# Patient Record
Sex: Male | Born: 1960 | Race: White | Hispanic: No | Marital: Married | Smoking: Former smoker
Health system: Southern US, Community
[De-identification: ages and names within clinical notes are randomized; demographics above are authoritative.]

## PROBLEM LIST (undated history)

## (undated) DIAGNOSIS — M359 Systemic involvement of connective tissue, unspecified: Secondary | ICD-10-CM

## (undated) DIAGNOSIS — N289 Disorder of kidney and ureter, unspecified: Secondary | ICD-10-CM

## (undated) DIAGNOSIS — I739 Peripheral vascular disease, unspecified: Secondary | ICD-10-CM

## (undated) DIAGNOSIS — M5030 Other cervical disc degeneration, unspecified cervical region: Secondary | ICD-10-CM

## (undated) DIAGNOSIS — M059 Rheumatoid arthritis with rheumatoid factor, unspecified: Secondary | ICD-10-CM

## (undated) DIAGNOSIS — E785 Hyperlipidemia, unspecified: Secondary | ICD-10-CM

## (undated) DIAGNOSIS — Z8719 Personal history of other diseases of the digestive system: Secondary | ICD-10-CM

## (undated) DIAGNOSIS — G4730 Sleep apnea, unspecified: Secondary | ICD-10-CM

## (undated) DIAGNOSIS — I10 Essential (primary) hypertension: Secondary | ICD-10-CM

## (undated) DIAGNOSIS — Z87442 Personal history of urinary calculi: Secondary | ICD-10-CM

## (undated) DIAGNOSIS — K5909 Other constipation: Secondary | ICD-10-CM

## (undated) DIAGNOSIS — M549 Dorsalgia, unspecified: Secondary | ICD-10-CM

## (undated) DIAGNOSIS — R634 Abnormal weight loss: Secondary | ICD-10-CM

## (undated) DIAGNOSIS — K3184 Gastroparesis: Secondary | ICD-10-CM

## (undated) HISTORY — DX: Hyperlipidemia, unspecified: E78.5

## (undated) HISTORY — DX: Peripheral vascular disease, unspecified: I73.9

## (undated) HISTORY — PX: KNEE RECONSTRUCTION: SHX5883

## (undated) HISTORY — PX: BACK SURGERY: SHX140

## (undated) HISTORY — DX: Other cervical disc degeneration, unspecified cervical region: M50.30

---

## 2004-12-13 ENCOUNTER — Emergency Department: Payer: Self-pay | Admitting: Emergency Medicine

## 2004-12-27 ENCOUNTER — Ambulatory Visit: Payer: Self-pay

## 2005-09-20 ENCOUNTER — Other Ambulatory Visit: Payer: Self-pay

## 2005-09-20 ENCOUNTER — Ambulatory Visit: Payer: Self-pay | Admitting: Surgery

## 2005-09-26 ENCOUNTER — Ambulatory Visit: Payer: Self-pay | Admitting: Surgery

## 2005-11-20 ENCOUNTER — Ambulatory Visit: Payer: Self-pay | Admitting: Surgery

## 2006-03-11 ENCOUNTER — Other Ambulatory Visit: Payer: Self-pay

## 2006-03-11 ENCOUNTER — Emergency Department: Payer: Self-pay | Admitting: Emergency Medicine

## 2009-03-16 ENCOUNTER — Emergency Department: Payer: Self-pay | Admitting: Internal Medicine

## 2009-10-15 ENCOUNTER — Emergency Department: Payer: Self-pay | Admitting: Emergency Medicine

## 2011-05-21 ENCOUNTER — Emergency Department: Payer: Self-pay | Admitting: Emergency Medicine

## 2011-10-17 DIAGNOSIS — L0291 Cutaneous abscess, unspecified: Secondary | ICD-10-CM | POA: Diagnosis not present

## 2011-10-17 DIAGNOSIS — L0390 Cellulitis, unspecified: Secondary | ICD-10-CM | POA: Diagnosis not present

## 2011-10-17 DIAGNOSIS — A4902 Methicillin resistant Staphylococcus aureus infection, unspecified site: Secondary | ICD-10-CM | POA: Diagnosis not present

## 2011-10-17 DIAGNOSIS — M069 Rheumatoid arthritis, unspecified: Secondary | ICD-10-CM | POA: Diagnosis not present

## 2011-10-17 DIAGNOSIS — L723 Sebaceous cyst: Secondary | ICD-10-CM | POA: Diagnosis not present

## 2011-10-28 DIAGNOSIS — IMO0001 Reserved for inherently not codable concepts without codable children: Secondary | ICD-10-CM | POA: Diagnosis not present

## 2011-10-28 DIAGNOSIS — F17200 Nicotine dependence, unspecified, uncomplicated: Secondary | ICD-10-CM | POA: Diagnosis not present

## 2011-10-28 DIAGNOSIS — M069 Rheumatoid arthritis, unspecified: Secondary | ICD-10-CM | POA: Diagnosis not present

## 2011-10-28 DIAGNOSIS — M549 Dorsalgia, unspecified: Secondary | ICD-10-CM | POA: Diagnosis not present

## 2011-10-28 DIAGNOSIS — R079 Chest pain, unspecified: Secondary | ICD-10-CM | POA: Diagnosis not present

## 2011-10-28 DIAGNOSIS — R05 Cough: Secondary | ICD-10-CM | POA: Diagnosis not present

## 2011-11-05 DIAGNOSIS — M5137 Other intervertebral disc degeneration, lumbosacral region: Secondary | ICD-10-CM | POA: Diagnosis not present

## 2011-11-05 DIAGNOSIS — G894 Chronic pain syndrome: Secondary | ICD-10-CM | POA: Diagnosis not present

## 2011-12-26 DIAGNOSIS — Z79899 Other long term (current) drug therapy: Secondary | ICD-10-CM | POA: Diagnosis not present

## 2011-12-26 DIAGNOSIS — M069 Rheumatoid arthritis, unspecified: Secondary | ICD-10-CM | POA: Diagnosis not present

## 2012-01-14 DIAGNOSIS — M069 Rheumatoid arthritis, unspecified: Secondary | ICD-10-CM | POA: Diagnosis not present

## 2012-02-07 DIAGNOSIS — H6120 Impacted cerumen, unspecified ear: Secondary | ICD-10-CM | POA: Diagnosis not present

## 2012-02-07 DIAGNOSIS — H6980 Other specified disorders of Eustachian tube, unspecified ear: Secondary | ICD-10-CM | POA: Diagnosis not present

## 2012-02-07 DIAGNOSIS — H908 Mixed conductive and sensorineural hearing loss, unspecified: Secondary | ICD-10-CM | POA: Diagnosis not present

## 2012-02-07 DIAGNOSIS — H9319 Tinnitus, unspecified ear: Secondary | ICD-10-CM | POA: Diagnosis not present

## 2012-02-27 DIAGNOSIS — Z79899 Other long term (current) drug therapy: Secondary | ICD-10-CM | POA: Diagnosis not present

## 2012-02-27 DIAGNOSIS — M5137 Other intervertebral disc degeneration, lumbosacral region: Secondary | ICD-10-CM | POA: Diagnosis not present

## 2012-02-27 DIAGNOSIS — M51379 Other intervertebral disc degeneration, lumbosacral region without mention of lumbar back pain or lower extremity pain: Secondary | ICD-10-CM | POA: Diagnosis not present

## 2012-02-27 DIAGNOSIS — G894 Chronic pain syndrome: Secondary | ICD-10-CM | POA: Diagnosis not present

## 2012-02-28 DIAGNOSIS — H908 Mixed conductive and sensorineural hearing loss, unspecified: Secondary | ICD-10-CM | POA: Diagnosis not present

## 2012-02-28 DIAGNOSIS — H6120 Impacted cerumen, unspecified ear: Secondary | ICD-10-CM | POA: Diagnosis not present

## 2012-02-28 DIAGNOSIS — H6980 Other specified disorders of Eustachian tube, unspecified ear: Secondary | ICD-10-CM | POA: Diagnosis not present

## 2012-04-03 ENCOUNTER — Emergency Department: Payer: Self-pay | Admitting: Emergency Medicine

## 2012-04-03 DIAGNOSIS — F17200 Nicotine dependence, unspecified, uncomplicated: Secondary | ICD-10-CM | POA: Diagnosis not present

## 2012-04-03 DIAGNOSIS — S59909A Unspecified injury of unspecified elbow, initial encounter: Secondary | ICD-10-CM | POA: Diagnosis not present

## 2012-04-03 DIAGNOSIS — S6990XA Unspecified injury of unspecified wrist, hand and finger(s), initial encounter: Secondary | ICD-10-CM | POA: Diagnosis not present

## 2012-04-03 DIAGNOSIS — M7989 Other specified soft tissue disorders: Secondary | ICD-10-CM | POA: Diagnosis not present

## 2012-04-03 DIAGNOSIS — S5010XA Contusion of unspecified forearm, initial encounter: Secondary | ICD-10-CM | POA: Diagnosis not present

## 2012-05-27 DIAGNOSIS — G894 Chronic pain syndrome: Secondary | ICD-10-CM | POA: Diagnosis not present

## 2012-05-27 DIAGNOSIS — M51379 Other intervertebral disc degeneration, lumbosacral region without mention of lumbar back pain or lower extremity pain: Secondary | ICD-10-CM | POA: Diagnosis not present

## 2012-05-27 DIAGNOSIS — M5137 Other intervertebral disc degeneration, lumbosacral region: Secondary | ICD-10-CM | POA: Diagnosis not present

## 2012-05-27 DIAGNOSIS — Z79899 Other long term (current) drug therapy: Secondary | ICD-10-CM | POA: Diagnosis not present

## 2012-07-15 DIAGNOSIS — M7020 Olecranon bursitis, unspecified elbow: Secondary | ICD-10-CM | POA: Diagnosis not present

## 2012-07-15 DIAGNOSIS — M069 Rheumatoid arthritis, unspecified: Secondary | ICD-10-CM | POA: Diagnosis not present

## 2012-08-12 DIAGNOSIS — Z23 Encounter for immunization: Secondary | ICD-10-CM | POA: Diagnosis not present

## 2012-08-25 DIAGNOSIS — Z79899 Other long term (current) drug therapy: Secondary | ICD-10-CM | POA: Diagnosis not present

## 2012-08-25 DIAGNOSIS — M5137 Other intervertebral disc degeneration, lumbosacral region: Secondary | ICD-10-CM | POA: Diagnosis not present

## 2012-08-25 DIAGNOSIS — G894 Chronic pain syndrome: Secondary | ICD-10-CM | POA: Diagnosis not present

## 2012-08-25 DIAGNOSIS — M51379 Other intervertebral disc degeneration, lumbosacral region without mention of lumbar back pain or lower extremity pain: Secondary | ICD-10-CM | POA: Diagnosis not present

## 2013-01-13 DIAGNOSIS — M069 Rheumatoid arthritis, unspecified: Secondary | ICD-10-CM | POA: Diagnosis not present

## 2013-01-14 DIAGNOSIS — Z79899 Other long term (current) drug therapy: Secondary | ICD-10-CM | POA: Diagnosis not present

## 2013-01-14 DIAGNOSIS — M069 Rheumatoid arthritis, unspecified: Secondary | ICD-10-CM | POA: Diagnosis not present

## 2013-02-24 DIAGNOSIS — G894 Chronic pain syndrome: Secondary | ICD-10-CM | POA: Diagnosis not present

## 2013-02-24 DIAGNOSIS — M5137 Other intervertebral disc degeneration, lumbosacral region: Secondary | ICD-10-CM | POA: Diagnosis not present

## 2013-02-24 DIAGNOSIS — Z79899 Other long term (current) drug therapy: Secondary | ICD-10-CM | POA: Diagnosis not present

## 2013-02-26 DIAGNOSIS — Z79899 Other long term (current) drug therapy: Secondary | ICD-10-CM | POA: Diagnosis not present

## 2013-05-25 DIAGNOSIS — M5137 Other intervertebral disc degeneration, lumbosacral region: Secondary | ICD-10-CM | POA: Diagnosis not present

## 2013-05-25 DIAGNOSIS — Z79899 Other long term (current) drug therapy: Secondary | ICD-10-CM | POA: Diagnosis not present

## 2013-05-25 DIAGNOSIS — G894 Chronic pain syndrome: Secondary | ICD-10-CM | POA: Diagnosis not present

## 2013-07-23 DIAGNOSIS — M159 Polyosteoarthritis, unspecified: Secondary | ICD-10-CM | POA: Diagnosis not present

## 2013-07-23 DIAGNOSIS — M069 Rheumatoid arthritis, unspecified: Secondary | ICD-10-CM | POA: Diagnosis not present

## 2013-07-23 DIAGNOSIS — M25549 Pain in joints of unspecified hand: Secondary | ICD-10-CM | POA: Diagnosis not present

## 2013-07-24 DIAGNOSIS — G894 Chronic pain syndrome: Secondary | ICD-10-CM | POA: Diagnosis not present

## 2013-07-24 DIAGNOSIS — Z79899 Other long term (current) drug therapy: Secondary | ICD-10-CM | POA: Diagnosis not present

## 2013-07-24 DIAGNOSIS — M5137 Other intervertebral disc degeneration, lumbosacral region: Secondary | ICD-10-CM | POA: Diagnosis not present

## 2013-08-04 DIAGNOSIS — Z23 Encounter for immunization: Secondary | ICD-10-CM | POA: Diagnosis not present

## 2013-09-03 DIAGNOSIS — M545 Low back pain, unspecified: Secondary | ICD-10-CM | POA: Diagnosis not present

## 2013-09-03 DIAGNOSIS — M069 Rheumatoid arthritis, unspecified: Secondary | ICD-10-CM | POA: Diagnosis not present

## 2013-09-03 DIAGNOSIS — M79609 Pain in unspecified limb: Secondary | ICD-10-CM | POA: Diagnosis not present

## 2013-09-22 DIAGNOSIS — G894 Chronic pain syndrome: Secondary | ICD-10-CM | POA: Diagnosis not present

## 2013-09-22 DIAGNOSIS — M5137 Other intervertebral disc degeneration, lumbosacral region: Secondary | ICD-10-CM | POA: Diagnosis not present

## 2013-09-22 DIAGNOSIS — Z79899 Other long term (current) drug therapy: Secondary | ICD-10-CM | POA: Diagnosis not present

## 2013-10-14 DIAGNOSIS — R079 Chest pain, unspecified: Secondary | ICD-10-CM | POA: Diagnosis not present

## 2013-10-14 DIAGNOSIS — R51 Headache: Secondary | ICD-10-CM | POA: Diagnosis not present

## 2013-10-14 DIAGNOSIS — I10 Essential (primary) hypertension: Secondary | ICD-10-CM | POA: Diagnosis not present

## 2013-10-15 ENCOUNTER — Other Ambulatory Visit: Payer: Self-pay | Admitting: Physician Assistant

## 2013-10-15 DIAGNOSIS — R51 Headache: Secondary | ICD-10-CM | POA: Diagnosis not present

## 2013-10-15 DIAGNOSIS — Z0000 Encounter for general adult medical examination without abnormal findings: Secondary | ICD-10-CM | POA: Diagnosis not present

## 2013-10-15 DIAGNOSIS — M069 Rheumatoid arthritis, unspecified: Secondary | ICD-10-CM | POA: Diagnosis not present

## 2013-10-15 DIAGNOSIS — Z13220 Encounter for screening for lipoid disorders: Secondary | ICD-10-CM | POA: Diagnosis not present

## 2013-10-15 DIAGNOSIS — M76899 Other specified enthesopathies of unspecified lower limb, excluding foot: Secondary | ICD-10-CM | POA: Diagnosis not present

## 2013-10-15 DIAGNOSIS — I10 Essential (primary) hypertension: Secondary | ICD-10-CM | POA: Diagnosis not present

## 2013-10-15 LAB — CBC WITH DIFFERENTIAL/PLATELET
BASOS ABS: 0.1 10*3/uL (ref 0.0–0.1)
BASOS PCT: 0.8 %
EOS PCT: 3.5 %
Eosinophil #: 0.3 10*3/uL (ref 0.0–0.7)
HCT: 44.4 % (ref 40.0–52.0)
HGB: 15.3 g/dL (ref 13.0–18.0)
LYMPHS PCT: 49.6 %
Lymphocyte #: 4.2 10*3/uL — ABNORMAL HIGH (ref 1.0–3.6)
MCH: 30.3 pg (ref 26.0–34.0)
MCHC: 34.4 g/dL (ref 32.0–36.0)
MCV: 88 fL (ref 80–100)
Monocyte #: 0.7 x10 3/mm (ref 0.2–1.0)
Monocyte %: 8.4 %
Neutrophil #: 3.2 10*3/uL (ref 1.4–6.5)
Neutrophil %: 37.7 %
PLATELETS: 242 10*3/uL (ref 150–440)
RBC: 5.04 10*6/uL (ref 4.40–5.90)
RDW: 13 % (ref 11.5–14.5)
WBC: 8.4 10*3/uL (ref 3.8–10.6)

## 2013-10-15 LAB — COMPREHENSIVE METABOLIC PANEL
Albumin: 3.2 g/dL — ABNORMAL LOW (ref 3.4–5.0)
Alkaline Phosphatase: 92 U/L
Anion Gap: 6 — ABNORMAL LOW (ref 7–16)
BUN: 17 mg/dL (ref 7–18)
Bilirubin,Total: 0.3 mg/dL (ref 0.2–1.0)
CO2: 25 mmol/L (ref 21–32)
CREATININE: 1.3 mg/dL (ref 0.60–1.30)
Calcium, Total: 8.8 mg/dL (ref 8.5–10.1)
Chloride: 111 mmol/L — ABNORMAL HIGH (ref 98–107)
EGFR (Non-African Amer.): >60
GLUCOSE: 87 mg/dL (ref 65–99)
Osmolality: 284 (ref 275–301)
POTASSIUM: 4 mmol/L (ref 3.5–5.1)
SGOT(AST): 26 U/L (ref 15–37)
SGPT (ALT): 23 U/L (ref 12–78)
Sodium: 142 mmol/L (ref 136–145)
Total Protein: 6.9 g/dL (ref 6.4–8.2)

## 2013-10-15 LAB — LIPID PANEL
CHOLESTEROL: 211 mg/dL — AB (ref 0–200)
HDL: 28 mg/dL — AB (ref 40–60)
Ldl Cholesterol, Calc: 146 mg/dL — ABNORMAL HIGH (ref 0–100)
TRIGLYCERIDES: 184 mg/dL (ref 0–200)
VLDL CHOLESTEROL, CALC: 37 mg/dL (ref 5–40)

## 2013-10-15 LAB — TSH: Thyroid Stimulating Horm: 1.77 u[IU]/mL

## 2013-10-16 DIAGNOSIS — I10 Essential (primary) hypertension: Secondary | ICD-10-CM | POA: Diagnosis not present

## 2013-10-16 DIAGNOSIS — R51 Headache: Secondary | ICD-10-CM | POA: Diagnosis not present

## 2013-10-16 DIAGNOSIS — E782 Mixed hyperlipidemia: Secondary | ICD-10-CM | POA: Diagnosis not present

## 2013-10-16 LAB — PSA: PSA: 0.8 ng/mL (ref 0.0–4.0)

## 2013-11-23 DIAGNOSIS — G894 Chronic pain syndrome: Secondary | ICD-10-CM | POA: Diagnosis not present

## 2013-11-23 DIAGNOSIS — Z5181 Encounter for therapeutic drug level monitoring: Secondary | ICD-10-CM | POA: Diagnosis not present

## 2013-11-23 DIAGNOSIS — Z79899 Other long term (current) drug therapy: Secondary | ICD-10-CM | POA: Diagnosis not present

## 2013-11-23 DIAGNOSIS — M5137 Other intervertebral disc degeneration, lumbosacral region: Secondary | ICD-10-CM | POA: Diagnosis not present

## 2013-12-08 DIAGNOSIS — N529 Male erectile dysfunction, unspecified: Secondary | ICD-10-CM | POA: Diagnosis not present

## 2013-12-08 DIAGNOSIS — R300 Dysuria: Secondary | ICD-10-CM | POA: Diagnosis not present

## 2013-12-08 DIAGNOSIS — Z125 Encounter for screening for malignant neoplasm of prostate: Secondary | ICD-10-CM | POA: Diagnosis not present

## 2013-12-08 DIAGNOSIS — E782 Mixed hyperlipidemia: Secondary | ICD-10-CM | POA: Diagnosis not present

## 2013-12-08 DIAGNOSIS — R6882 Decreased libido: Secondary | ICD-10-CM | POA: Diagnosis not present

## 2013-12-08 DIAGNOSIS — Z0000 Encounter for general adult medical examination without abnormal findings: Secondary | ICD-10-CM | POA: Diagnosis not present

## 2013-12-08 DIAGNOSIS — I10 Essential (primary) hypertension: Secondary | ICD-10-CM | POA: Diagnosis not present

## 2013-12-22 DIAGNOSIS — R3129 Other microscopic hematuria: Secondary | ICD-10-CM | POA: Diagnosis not present

## 2013-12-22 DIAGNOSIS — N529 Male erectile dysfunction, unspecified: Secondary | ICD-10-CM | POA: Diagnosis not present

## 2013-12-22 DIAGNOSIS — N400 Enlarged prostate without lower urinary tract symptoms: Secondary | ICD-10-CM | POA: Diagnosis not present

## 2014-01-05 ENCOUNTER — Ambulatory Visit: Payer: Self-pay | Admitting: Urology

## 2014-01-05 DIAGNOSIS — R3129 Other microscopic hematuria: Secondary | ICD-10-CM | POA: Diagnosis not present

## 2014-01-05 DIAGNOSIS — N200 Calculus of kidney: Secondary | ICD-10-CM | POA: Diagnosis not present

## 2014-01-05 DIAGNOSIS — N269 Renal sclerosis, unspecified: Secondary | ICD-10-CM | POA: Diagnosis not present

## 2014-01-05 DIAGNOSIS — I7090 Unspecified atherosclerosis: Secondary | ICD-10-CM | POA: Diagnosis not present

## 2014-01-07 DIAGNOSIS — R319 Hematuria, unspecified: Secondary | ICD-10-CM | POA: Diagnosis not present

## 2014-01-07 DIAGNOSIS — E291 Testicular hypofunction: Secondary | ICD-10-CM | POA: Diagnosis not present

## 2014-01-07 DIAGNOSIS — N200 Calculus of kidney: Secondary | ICD-10-CM | POA: Diagnosis not present

## 2014-01-07 DIAGNOSIS — I701 Atherosclerosis of renal artery: Secondary | ICD-10-CM | POA: Diagnosis not present

## 2014-01-07 DIAGNOSIS — M79609 Pain in unspecified limb: Secondary | ICD-10-CM | POA: Diagnosis not present

## 2014-01-07 DIAGNOSIS — Q619 Cystic kidney disease, unspecified: Secondary | ICD-10-CM | POA: Diagnosis not present

## 2014-01-07 DIAGNOSIS — R42 Dizziness and giddiness: Secondary | ICD-10-CM | POA: Diagnosis not present

## 2014-01-07 DIAGNOSIS — I7090 Unspecified atherosclerosis: Secondary | ICD-10-CM | POA: Diagnosis not present

## 2014-01-07 DIAGNOSIS — I10 Essential (primary) hypertension: Secondary | ICD-10-CM | POA: Diagnosis not present

## 2014-01-08 ENCOUNTER — Other Ambulatory Visit: Payer: Self-pay | Admitting: Physician Assistant

## 2014-01-08 DIAGNOSIS — I2510 Atherosclerotic heart disease of native coronary artery without angina pectoris: Secondary | ICD-10-CM | POA: Diagnosis not present

## 2014-01-08 LAB — COMPREHENSIVE METABOLIC PANEL
ALBUMIN: 3.2 g/dL — AB (ref 3.4–5.0)
ANION GAP: 5 — AB (ref 7–16)
Alkaline Phosphatase: 101 U/L
BUN: 20 mg/dL — AB (ref 7–18)
Bilirubin,Total: 0.2 mg/dL (ref 0.2–1.0)
CHLORIDE: 110 mmol/L — AB (ref 98–107)
CREATININE: 1.23 mg/dL (ref 0.60–1.30)
Calcium, Total: 8.5 mg/dL (ref 8.5–10.1)
Co2: 23 mmol/L (ref 21–32)
EGFR (African American): >60
Glucose: 173 mg/dL — ABNORMAL HIGH (ref 65–99)
OSMOLALITY: 282 (ref 275–301)
Potassium: 3.5 mmol/L (ref 3.5–5.1)
SGOT(AST): 17 U/L (ref 15–37)
SGPT (ALT): 15 U/L (ref 12–78)
SODIUM: 138 mmol/L (ref 136–145)
Total Protein: 6.7 g/dL (ref 6.4–8.2)

## 2014-01-18 ENCOUNTER — Ambulatory Visit: Payer: Self-pay | Admitting: Urology

## 2014-01-18 DIAGNOSIS — Q619 Cystic kidney disease, unspecified: Secondary | ICD-10-CM | POA: Diagnosis not present

## 2014-01-18 DIAGNOSIS — N269 Renal sclerosis, unspecified: Secondary | ICD-10-CM | POA: Diagnosis not present

## 2014-01-18 DIAGNOSIS — N200 Calculus of kidney: Secondary | ICD-10-CM | POA: Diagnosis not present

## 2014-01-20 DIAGNOSIS — M5137 Other intervertebral disc degeneration, lumbosacral region: Secondary | ICD-10-CM | POA: Diagnosis not present

## 2014-01-20 DIAGNOSIS — Z79899 Other long term (current) drug therapy: Secondary | ICD-10-CM | POA: Diagnosis not present

## 2014-01-20 DIAGNOSIS — Z5181 Encounter for therapeutic drug level monitoring: Secondary | ICD-10-CM | POA: Diagnosis not present

## 2014-01-20 DIAGNOSIS — G894 Chronic pain syndrome: Secondary | ICD-10-CM | POA: Diagnosis not present

## 2014-01-21 DIAGNOSIS — M069 Rheumatoid arthritis, unspecified: Secondary | ICD-10-CM | POA: Diagnosis not present

## 2014-01-26 DIAGNOSIS — R3129 Other microscopic hematuria: Secondary | ICD-10-CM | POA: Diagnosis not present

## 2014-01-26 DIAGNOSIS — Q619 Cystic kidney disease, unspecified: Secondary | ICD-10-CM | POA: Diagnosis not present

## 2014-01-26 DIAGNOSIS — E291 Testicular hypofunction: Secondary | ICD-10-CM | POA: Diagnosis not present

## 2014-02-04 DIAGNOSIS — I7090 Unspecified atherosclerosis: Secondary | ICD-10-CM | POA: Diagnosis not present

## 2014-02-04 DIAGNOSIS — I701 Atherosclerosis of renal artery: Secondary | ICD-10-CM | POA: Diagnosis not present

## 2014-02-08 DIAGNOSIS — I7090 Unspecified atherosclerosis: Secondary | ICD-10-CM | POA: Diagnosis not present

## 2014-02-16 DIAGNOSIS — E291 Testicular hypofunction: Secondary | ICD-10-CM | POA: Diagnosis not present

## 2014-02-17 DIAGNOSIS — I7090 Unspecified atherosclerosis: Secondary | ICD-10-CM | POA: Diagnosis not present

## 2014-02-17 DIAGNOSIS — I701 Atherosclerosis of renal artery: Secondary | ICD-10-CM | POA: Diagnosis not present

## 2014-02-17 DIAGNOSIS — E782 Mixed hyperlipidemia: Secondary | ICD-10-CM | POA: Diagnosis not present

## 2014-02-17 DIAGNOSIS — E291 Testicular hypofunction: Secondary | ICD-10-CM | POA: Diagnosis not present

## 2014-02-17 DIAGNOSIS — M79609 Pain in unspecified limb: Secondary | ICD-10-CM | POA: Diagnosis not present

## 2014-02-17 DIAGNOSIS — I10 Essential (primary) hypertension: Secondary | ICD-10-CM | POA: Diagnosis not present

## 2014-03-19 DIAGNOSIS — G894 Chronic pain syndrome: Secondary | ICD-10-CM | POA: Diagnosis not present

## 2014-03-19 DIAGNOSIS — Z5181 Encounter for therapeutic drug level monitoring: Secondary | ICD-10-CM | POA: Diagnosis not present

## 2014-03-19 DIAGNOSIS — M5137 Other intervertebral disc degeneration, lumbosacral region: Secondary | ICD-10-CM | POA: Diagnosis not present

## 2014-03-19 DIAGNOSIS — Z79899 Other long term (current) drug therapy: Secondary | ICD-10-CM | POA: Diagnosis not present

## 2014-03-22 DIAGNOSIS — I739 Peripheral vascular disease, unspecified: Secondary | ICD-10-CM | POA: Diagnosis not present

## 2014-03-22 DIAGNOSIS — I7090 Unspecified atherosclerosis: Secondary | ICD-10-CM | POA: Diagnosis not present

## 2014-04-12 DIAGNOSIS — E291 Testicular hypofunction: Secondary | ICD-10-CM | POA: Diagnosis not present

## 2014-04-21 DIAGNOSIS — M059 Rheumatoid arthritis with rheumatoid factor, unspecified: Secondary | ICD-10-CM | POA: Insufficient documentation

## 2014-04-21 DIAGNOSIS — M549 Dorsalgia, unspecified: Secondary | ICD-10-CM | POA: Insufficient documentation

## 2014-04-22 DIAGNOSIS — M069 Rheumatoid arthritis, unspecified: Secondary | ICD-10-CM | POA: Diagnosis not present

## 2014-05-11 DIAGNOSIS — I7090 Unspecified atherosclerosis: Secondary | ICD-10-CM | POA: Diagnosis not present

## 2014-05-11 DIAGNOSIS — I10 Essential (primary) hypertension: Secondary | ICD-10-CM | POA: Diagnosis not present

## 2014-05-11 DIAGNOSIS — E291 Testicular hypofunction: Secondary | ICD-10-CM | POA: Diagnosis not present

## 2014-05-11 DIAGNOSIS — E782 Mixed hyperlipidemia: Secondary | ICD-10-CM | POA: Diagnosis not present

## 2014-05-11 DIAGNOSIS — I739 Peripheral vascular disease, unspecified: Secondary | ICD-10-CM | POA: Diagnosis not present

## 2014-05-11 DIAGNOSIS — L255 Unspecified contact dermatitis due to plants, except food: Secondary | ICD-10-CM | POA: Diagnosis not present

## 2014-05-11 DIAGNOSIS — G479 Sleep disorder, unspecified: Secondary | ICD-10-CM | POA: Diagnosis not present

## 2014-05-18 DIAGNOSIS — Z79899 Other long term (current) drug therapy: Secondary | ICD-10-CM | POA: Diagnosis not present

## 2014-05-18 DIAGNOSIS — Z5181 Encounter for therapeutic drug level monitoring: Secondary | ICD-10-CM | POA: Diagnosis not present

## 2014-05-18 DIAGNOSIS — G894 Chronic pain syndrome: Secondary | ICD-10-CM | POA: Diagnosis not present

## 2014-05-18 DIAGNOSIS — M5137 Other intervertebral disc degeneration, lumbosacral region: Secondary | ICD-10-CM | POA: Diagnosis not present

## 2014-05-24 DIAGNOSIS — G4710 Hypersomnia, unspecified: Secondary | ICD-10-CM | POA: Diagnosis not present

## 2014-05-24 DIAGNOSIS — G4720 Circadian rhythm sleep disorder, unspecified type: Secondary | ICD-10-CM | POA: Diagnosis not present

## 2014-06-03 DIAGNOSIS — G4730 Sleep apnea, unspecified: Secondary | ICD-10-CM | POA: Diagnosis not present

## 2014-06-03 DIAGNOSIS — G4710 Hypersomnia, unspecified: Secondary | ICD-10-CM | POA: Diagnosis not present

## 2014-06-03 DIAGNOSIS — G4720 Circadian rhythm sleep disorder, unspecified type: Secondary | ICD-10-CM | POA: Diagnosis not present

## 2014-06-23 DIAGNOSIS — G4710 Hypersomnia, unspecified: Secondary | ICD-10-CM | POA: Diagnosis not present

## 2014-06-23 DIAGNOSIS — G4720 Circadian rhythm sleep disorder, unspecified type: Secondary | ICD-10-CM | POA: Diagnosis not present

## 2014-07-06 DIAGNOSIS — G479 Sleep disorder, unspecified: Secondary | ICD-10-CM | POA: Diagnosis not present

## 2014-07-06 DIAGNOSIS — R0602 Shortness of breath: Secondary | ICD-10-CM | POA: Diagnosis not present

## 2014-07-06 DIAGNOSIS — G4739 Other sleep apnea: Secondary | ICD-10-CM | POA: Diagnosis not present

## 2014-07-30 DIAGNOSIS — Z23 Encounter for immunization: Secondary | ICD-10-CM | POA: Diagnosis not present

## 2014-07-30 DIAGNOSIS — G894 Chronic pain syndrome: Secondary | ICD-10-CM | POA: Diagnosis not present

## 2014-07-30 DIAGNOSIS — M5137 Other intervertebral disc degeneration, lumbosacral region: Secondary | ICD-10-CM | POA: Diagnosis not present

## 2014-07-30 DIAGNOSIS — Z79891 Long term (current) use of opiate analgesic: Secondary | ICD-10-CM | POA: Diagnosis not present

## 2014-08-04 DIAGNOSIS — E291 Testicular hypofunction: Secondary | ICD-10-CM | POA: Diagnosis not present

## 2014-08-04 DIAGNOSIS — I10 Essential (primary) hypertension: Secondary | ICD-10-CM | POA: Diagnosis not present

## 2014-08-04 DIAGNOSIS — R339 Retention of urine, unspecified: Secondary | ICD-10-CM | POA: Diagnosis not present

## 2014-08-04 DIAGNOSIS — N400 Enlarged prostate without lower urinary tract symptoms: Secondary | ICD-10-CM | POA: Diagnosis not present

## 2014-08-04 DIAGNOSIS — F5221 Male erectile disorder: Secondary | ICD-10-CM | POA: Diagnosis not present

## 2014-08-10 DIAGNOSIS — E782 Mixed hyperlipidemia: Secondary | ICD-10-CM | POA: Diagnosis not present

## 2014-08-10 DIAGNOSIS — J0101 Acute recurrent maxillary sinusitis: Secondary | ICD-10-CM | POA: Diagnosis not present

## 2014-08-10 DIAGNOSIS — I10 Essential (primary) hypertension: Secondary | ICD-10-CM | POA: Diagnosis not present

## 2014-09-28 DIAGNOSIS — M5137 Other intervertebral disc degeneration, lumbosacral region: Secondary | ICD-10-CM | POA: Diagnosis not present

## 2014-09-28 DIAGNOSIS — Z79891 Long term (current) use of opiate analgesic: Secondary | ICD-10-CM | POA: Diagnosis not present

## 2014-09-28 DIAGNOSIS — G894 Chronic pain syndrome: Secondary | ICD-10-CM | POA: Diagnosis not present

## 2014-10-19 DIAGNOSIS — M0570 Rheumatoid arthritis with rheumatoid factor of unspecified site without organ or systems involvement: Secondary | ICD-10-CM | POA: Diagnosis not present

## 2014-10-19 DIAGNOSIS — E785 Hyperlipidemia, unspecified: Secondary | ICD-10-CM | POA: Diagnosis not present

## 2014-10-19 DIAGNOSIS — M069 Rheumatoid arthritis, unspecified: Secondary | ICD-10-CM | POA: Diagnosis not present

## 2014-10-19 DIAGNOSIS — Z0000 Encounter for general adult medical examination without abnormal findings: Secondary | ICD-10-CM | POA: Diagnosis not present

## 2014-10-19 DIAGNOSIS — Z125 Encounter for screening for malignant neoplasm of prostate: Secondary | ICD-10-CM | POA: Diagnosis not present

## 2014-10-26 DIAGNOSIS — M069 Rheumatoid arthritis, unspecified: Secondary | ICD-10-CM | POA: Diagnosis not present

## 2014-11-04 DIAGNOSIS — I10 Essential (primary) hypertension: Secondary | ICD-10-CM | POA: Diagnosis not present

## 2014-11-04 DIAGNOSIS — E291 Testicular hypofunction: Secondary | ICD-10-CM | POA: Diagnosis not present

## 2014-11-04 DIAGNOSIS — R1031 Right lower quadrant pain: Secondary | ICD-10-CM | POA: Diagnosis not present

## 2014-11-04 DIAGNOSIS — R310 Gross hematuria: Secondary | ICD-10-CM | POA: Diagnosis not present

## 2014-11-04 DIAGNOSIS — N400 Enlarged prostate without lower urinary tract symptoms: Secondary | ICD-10-CM | POA: Diagnosis not present

## 2014-11-11 DIAGNOSIS — I10 Essential (primary) hypertension: Secondary | ICD-10-CM | POA: Diagnosis not present

## 2014-11-11 DIAGNOSIS — E291 Testicular hypofunction: Secondary | ICD-10-CM | POA: Diagnosis not present

## 2014-11-11 DIAGNOSIS — R310 Gross hematuria: Secondary | ICD-10-CM | POA: Diagnosis not present

## 2014-11-11 DIAGNOSIS — R1031 Right lower quadrant pain: Secondary | ICD-10-CM | POA: Diagnosis not present

## 2014-11-12 ENCOUNTER — Ambulatory Visit: Payer: Self-pay

## 2014-11-12 DIAGNOSIS — N200 Calculus of kidney: Secondary | ICD-10-CM | POA: Diagnosis not present

## 2014-11-12 DIAGNOSIS — I708 Atherosclerosis of other arteries: Secondary | ICD-10-CM | POA: Diagnosis not present

## 2014-11-12 DIAGNOSIS — N132 Hydronephrosis with renal and ureteral calculous obstruction: Secondary | ICD-10-CM | POA: Diagnosis not present

## 2014-11-17 ENCOUNTER — Ambulatory Visit: Payer: Self-pay | Admitting: Urology

## 2014-11-17 DIAGNOSIS — N200 Calculus of kidney: Secondary | ICD-10-CM | POA: Diagnosis not present

## 2014-11-17 DIAGNOSIS — R938 Abnormal findings on diagnostic imaging of other specified body structures: Secondary | ICD-10-CM | POA: Diagnosis not present

## 2014-11-17 DIAGNOSIS — I10 Essential (primary) hypertension: Secondary | ICD-10-CM | POA: Diagnosis not present

## 2014-11-17 DIAGNOSIS — N202 Calculus of kidney with calculus of ureter: Secondary | ICD-10-CM | POA: Diagnosis not present

## 2014-11-19 DIAGNOSIS — I701 Atherosclerosis of renal artery: Secondary | ICD-10-CM | POA: Diagnosis not present

## 2014-11-19 DIAGNOSIS — S9032XA Contusion of left foot, initial encounter: Secondary | ICD-10-CM | POA: Diagnosis not present

## 2014-11-19 DIAGNOSIS — I70223 Atherosclerosis of native arteries of extremities with rest pain, bilateral legs: Secondary | ICD-10-CM | POA: Diagnosis not present

## 2014-11-24 ENCOUNTER — Ambulatory Visit: Payer: Self-pay | Admitting: Physician Assistant

## 2014-11-24 DIAGNOSIS — N201 Calculus of ureter: Secondary | ICD-10-CM | POA: Diagnosis not present

## 2014-11-24 DIAGNOSIS — I25119 Atherosclerotic heart disease of native coronary artery with unspecified angina pectoris: Secondary | ICD-10-CM | POA: Diagnosis not present

## 2014-11-24 DIAGNOSIS — N132 Hydronephrosis with renal and ureteral calculous obstruction: Secondary | ICD-10-CM | POA: Diagnosis not present

## 2014-11-24 DIAGNOSIS — N261 Atrophy of kidney (terminal): Secondary | ICD-10-CM | POA: Diagnosis not present

## 2014-11-25 DIAGNOSIS — Z79891 Long term (current) use of opiate analgesic: Secondary | ICD-10-CM | POA: Diagnosis not present

## 2014-12-14 DIAGNOSIS — I739 Peripheral vascular disease, unspecified: Secondary | ICD-10-CM | POA: Diagnosis not present

## 2014-12-14 DIAGNOSIS — N201 Calculus of ureter: Secondary | ICD-10-CM | POA: Diagnosis not present

## 2014-12-14 DIAGNOSIS — E782 Mixed hyperlipidemia: Secondary | ICD-10-CM | POA: Diagnosis not present

## 2014-12-14 DIAGNOSIS — I10 Essential (primary) hypertension: Secondary | ICD-10-CM | POA: Diagnosis not present

## 2014-12-14 DIAGNOSIS — Z0001 Encounter for general adult medical examination with abnormal findings: Secondary | ICD-10-CM | POA: Diagnosis not present

## 2014-12-14 DIAGNOSIS — E291 Testicular hypofunction: Secondary | ICD-10-CM | POA: Diagnosis not present

## 2014-12-14 DIAGNOSIS — I70223 Atherosclerosis of native arteries of extremities with rest pain, bilateral legs: Secondary | ICD-10-CM | POA: Diagnosis not present

## 2014-12-31 DIAGNOSIS — I708 Atherosclerosis of other arteries: Secondary | ICD-10-CM | POA: Diagnosis not present

## 2015-01-07 DIAGNOSIS — N200 Calculus of kidney: Secondary | ICD-10-CM | POA: Diagnosis not present

## 2015-01-25 DIAGNOSIS — M5137 Other intervertebral disc degeneration, lumbosacral region: Secondary | ICD-10-CM | POA: Diagnosis not present

## 2015-01-25 DIAGNOSIS — Z79891 Long term (current) use of opiate analgesic: Secondary | ICD-10-CM | POA: Diagnosis not present

## 2015-01-25 DIAGNOSIS — G894 Chronic pain syndrome: Secondary | ICD-10-CM | POA: Diagnosis not present

## 2015-02-08 DIAGNOSIS — R51 Headache: Secondary | ICD-10-CM | POA: Diagnosis not present

## 2015-02-08 DIAGNOSIS — R0602 Shortness of breath: Secondary | ICD-10-CM | POA: Diagnosis not present

## 2015-02-08 DIAGNOSIS — G4733 Obstructive sleep apnea (adult) (pediatric): Secondary | ICD-10-CM | POA: Diagnosis not present

## 2015-02-09 DIAGNOSIS — R0602 Shortness of breath: Secondary | ICD-10-CM | POA: Diagnosis not present

## 2015-03-15 DIAGNOSIS — G4733 Obstructive sleep apnea (adult) (pediatric): Secondary | ICD-10-CM | POA: Diagnosis not present

## 2015-03-15 DIAGNOSIS — R0602 Shortness of breath: Secondary | ICD-10-CM | POA: Diagnosis not present

## 2015-03-15 DIAGNOSIS — J309 Allergic rhinitis, unspecified: Secondary | ICD-10-CM | POA: Diagnosis not present

## 2015-03-28 DIAGNOSIS — G894 Chronic pain syndrome: Secondary | ICD-10-CM | POA: Diagnosis not present

## 2015-03-28 DIAGNOSIS — Z79891 Long term (current) use of opiate analgesic: Secondary | ICD-10-CM | POA: Diagnosis not present

## 2015-03-28 DIAGNOSIS — M5137 Other intervertebral disc degeneration, lumbosacral region: Secondary | ICD-10-CM | POA: Diagnosis not present

## 2015-03-31 ENCOUNTER — Telehealth: Payer: Self-pay

## 2015-03-31 NOTE — Telephone Encounter (Signed)
Received a fax that pt androgel PA was rejected.

## 2015-04-13 DIAGNOSIS — F331 Major depressive disorder, recurrent, moderate: Secondary | ICD-10-CM | POA: Diagnosis not present

## 2015-04-13 DIAGNOSIS — G4733 Obstructive sleep apnea (adult) (pediatric): Secondary | ICD-10-CM | POA: Diagnosis not present

## 2015-04-13 DIAGNOSIS — Z634 Disappearance and death of family member: Secondary | ICD-10-CM | POA: Diagnosis not present

## 2015-04-20 DIAGNOSIS — G4733 Obstructive sleep apnea (adult) (pediatric): Secondary | ICD-10-CM | POA: Diagnosis not present

## 2015-04-26 DIAGNOSIS — I2510 Atherosclerotic heart disease of native coronary artery without angina pectoris: Secondary | ICD-10-CM | POA: Diagnosis not present

## 2015-04-26 DIAGNOSIS — N200 Calculus of kidney: Secondary | ICD-10-CM | POA: Diagnosis not present

## 2015-04-26 DIAGNOSIS — R252 Cramp and spasm: Secondary | ICD-10-CM | POA: Diagnosis not present

## 2015-04-26 DIAGNOSIS — M069 Rheumatoid arthritis, unspecified: Secondary | ICD-10-CM | POA: Diagnosis not present

## 2015-05-26 DIAGNOSIS — M5137 Other intervertebral disc degeneration, lumbosacral region: Secondary | ICD-10-CM | POA: Diagnosis not present

## 2015-05-26 DIAGNOSIS — Z79891 Long term (current) use of opiate analgesic: Secondary | ICD-10-CM | POA: Diagnosis not present

## 2015-05-26 DIAGNOSIS — G894 Chronic pain syndrome: Secondary | ICD-10-CM | POA: Diagnosis not present

## 2015-06-20 DIAGNOSIS — L309 Dermatitis, unspecified: Secondary | ICD-10-CM | POA: Diagnosis not present

## 2015-06-20 DIAGNOSIS — F331 Major depressive disorder, recurrent, moderate: Secondary | ICD-10-CM | POA: Diagnosis not present

## 2015-06-20 DIAGNOSIS — G4733 Obstructive sleep apnea (adult) (pediatric): Secondary | ICD-10-CM | POA: Diagnosis not present

## 2015-06-20 DIAGNOSIS — M069 Rheumatoid arthritis, unspecified: Secondary | ICD-10-CM | POA: Diagnosis not present

## 2015-06-27 DIAGNOSIS — I2510 Atherosclerotic heart disease of native coronary artery without angina pectoris: Secondary | ICD-10-CM | POA: Diagnosis not present

## 2015-06-27 DIAGNOSIS — M069 Rheumatoid arthritis, unspecified: Secondary | ICD-10-CM | POA: Diagnosis not present

## 2015-06-27 DIAGNOSIS — N200 Calculus of kidney: Secondary | ICD-10-CM | POA: Diagnosis not present

## 2015-07-25 DIAGNOSIS — Z79891 Long term (current) use of opiate analgesic: Secondary | ICD-10-CM | POA: Diagnosis not present

## 2015-09-15 ENCOUNTER — Other Ambulatory Visit: Payer: Self-pay | Admitting: Internal Medicine

## 2015-09-15 DIAGNOSIS — G4733 Obstructive sleep apnea (adult) (pediatric): Secondary | ICD-10-CM | POA: Diagnosis not present

## 2015-09-15 DIAGNOSIS — F17210 Nicotine dependence, cigarettes, uncomplicated: Secondary | ICD-10-CM | POA: Diagnosis not present

## 2015-09-15 DIAGNOSIS — R1115 Cyclical vomiting syndrome unrelated to migraine: Secondary | ICD-10-CM

## 2015-09-15 DIAGNOSIS — R1110 Vomiting, unspecified: Secondary | ICD-10-CM | POA: Diagnosis not present

## 2015-09-15 DIAGNOSIS — R0602 Shortness of breath: Secondary | ICD-10-CM | POA: Diagnosis not present

## 2015-09-21 DIAGNOSIS — M5137 Other intervertebral disc degeneration, lumbosacral region: Secondary | ICD-10-CM | POA: Diagnosis not present

## 2015-09-21 DIAGNOSIS — Z23 Encounter for immunization: Secondary | ICD-10-CM | POA: Diagnosis not present

## 2015-09-21 DIAGNOSIS — G8929 Other chronic pain: Secondary | ICD-10-CM | POA: Diagnosis not present

## 2015-09-21 DIAGNOSIS — Z79891 Long term (current) use of opiate analgesic: Secondary | ICD-10-CM | POA: Diagnosis not present

## 2015-09-21 DIAGNOSIS — M1990 Unspecified osteoarthritis, unspecified site: Secondary | ICD-10-CM | POA: Diagnosis not present

## 2015-09-28 ENCOUNTER — Ambulatory Visit
Admission: RE | Admit: 2015-09-28 | Discharge: 2015-09-28 | Disposition: A | Discharge status: Home / Self Care | Payer: Medicare Other | Source: Ambulatory Visit | Attending: Internal Medicine | Admitting: Internal Medicine

## 2015-09-28 DIAGNOSIS — R1110 Vomiting, unspecified: Secondary | ICD-10-CM | POA: Insufficient documentation

## 2015-09-28 DIAGNOSIS — R1115 Cyclical vomiting syndrome unrelated to migraine: Secondary | ICD-10-CM

## 2015-09-28 DIAGNOSIS — R112 Nausea with vomiting, unspecified: Secondary | ICD-10-CM | POA: Diagnosis not present

## 2015-10-07 ENCOUNTER — Other Ambulatory Visit: Payer: Self-pay | Admitting: Physician Assistant

## 2015-10-07 DIAGNOSIS — R112 Nausea with vomiting, unspecified: Secondary | ICD-10-CM

## 2015-10-07 DIAGNOSIS — R634 Abnormal weight loss: Secondary | ICD-10-CM

## 2015-10-10 ENCOUNTER — Other Ambulatory Visit: Payer: Self-pay | Admitting: Physician Assistant

## 2015-10-10 DIAGNOSIS — R112 Nausea with vomiting, unspecified: Secondary | ICD-10-CM

## 2015-10-10 DIAGNOSIS — R634 Abnormal weight loss: Secondary | ICD-10-CM

## 2015-10-11 ENCOUNTER — Ambulatory Visit: Admission: RE | Admit: 2015-10-11 | Payer: Medicare Other | Source: Ambulatory Visit

## 2015-10-12 ENCOUNTER — Ambulatory Visit
Admission: RE | Admit: 2015-10-12 | Discharge: 2015-10-12 | Disposition: A | Discharge status: Home / Self Care | Payer: Medicare Other | Source: Ambulatory Visit | Attending: Physician Assistant | Admitting: Physician Assistant

## 2015-10-12 DIAGNOSIS — N200 Calculus of kidney: Secondary | ICD-10-CM | POA: Diagnosis not present

## 2015-10-12 DIAGNOSIS — I723 Aneurysm of iliac artery: Secondary | ICD-10-CM | POA: Insufficient documentation

## 2015-10-12 DIAGNOSIS — R634 Abnormal weight loss: Secondary | ICD-10-CM | POA: Insufficient documentation

## 2015-10-12 DIAGNOSIS — K5730 Diverticulosis of large intestine without perforation or abscess without bleeding: Secondary | ICD-10-CM | POA: Insufficient documentation

## 2015-10-12 DIAGNOSIS — R112 Nausea with vomiting, unspecified: Secondary | ICD-10-CM | POA: Insufficient documentation

## 2015-10-12 HISTORY — DX: Essential (primary) hypertension: I10

## 2015-10-12 HISTORY — DX: Systemic involvement of connective tissue, unspecified: M35.9

## 2015-10-12 MED ORDER — IOHEXOL 300 MG/ML  SOLN
100.0000 mL | Freq: Once | INTRAMUSCULAR | Status: AC | PRN
  Administered 2015-10-12: 100 mL via INTRAVENOUS

## 2015-10-19 DIAGNOSIS — G4733 Obstructive sleep apnea (adult) (pediatric): Secondary | ICD-10-CM | POA: Diagnosis not present

## 2015-10-20 DIAGNOSIS — I10 Essential (primary) hypertension: Secondary | ICD-10-CM | POA: Diagnosis not present

## 2015-10-20 DIAGNOSIS — I701 Atherosclerosis of renal artery: Secondary | ICD-10-CM | POA: Diagnosis not present

## 2015-10-20 DIAGNOSIS — R1110 Vomiting, unspecified: Secondary | ICD-10-CM | POA: Diagnosis not present

## 2015-10-20 DIAGNOSIS — I70223 Atherosclerosis of native arteries of extremities with rest pain, bilateral legs: Secondary | ICD-10-CM | POA: Diagnosis not present

## 2015-10-20 DIAGNOSIS — E782 Mixed hyperlipidemia: Secondary | ICD-10-CM | POA: Diagnosis not present

## 2015-10-20 DIAGNOSIS — K3189 Other diseases of stomach and duodenum: Secondary | ICD-10-CM | POA: Diagnosis not present

## 2015-10-24 DIAGNOSIS — K219 Gastro-esophageal reflux disease without esophagitis: Secondary | ICD-10-CM | POA: Diagnosis not present

## 2015-10-24 DIAGNOSIS — K5903 Drug induced constipation: Secondary | ICD-10-CM | POA: Diagnosis not present

## 2015-10-24 DIAGNOSIS — K625 Hemorrhage of anus and rectum: Secondary | ICD-10-CM | POA: Diagnosis not present

## 2015-10-24 DIAGNOSIS — R112 Nausea with vomiting, unspecified: Secondary | ICD-10-CM | POA: Diagnosis not present

## 2015-10-24 DIAGNOSIS — R1013 Epigastric pain: Secondary | ICD-10-CM | POA: Diagnosis not present

## 2015-11-10 ENCOUNTER — Encounter: Payer: Self-pay | Admitting: *Deleted

## 2015-11-11 ENCOUNTER — Other Ambulatory Visit: Payer: Self-pay | Admitting: Gastroenterology

## 2015-11-11 ENCOUNTER — Encounter
Admission: RE | Disposition: A | Discharge status: Home / Self Care | Payer: Self-pay | Source: Ambulatory Visit | Attending: Gastroenterology

## 2015-11-11 ENCOUNTER — Ambulatory Visit
Admission: RE | Admit: 2015-11-11 | Discharge: 2015-11-11 | Disposition: A | Discharge status: Home / Self Care | Payer: Medicare Other | Source: Ambulatory Visit | Attending: Gastroenterology | Admitting: Gastroenterology

## 2015-11-11 ENCOUNTER — Encounter: Payer: Self-pay | Admitting: *Deleted

## 2015-11-11 ENCOUNTER — Ambulatory Visit: Payer: Medicare Other | Admitting: Anesthesiology

## 2015-11-11 DIAGNOSIS — I10 Essential (primary) hypertension: Secondary | ICD-10-CM | POA: Diagnosis not present

## 2015-11-11 DIAGNOSIS — R1310 Dysphagia, unspecified: Secondary | ICD-10-CM | POA: Insufficient documentation

## 2015-11-11 DIAGNOSIS — N289 Disorder of kidney and ureter, unspecified: Secondary | ICD-10-CM | POA: Insufficient documentation

## 2015-11-11 DIAGNOSIS — K219 Gastro-esophageal reflux disease without esophagitis: Secondary | ICD-10-CM | POA: Insufficient documentation

## 2015-11-11 DIAGNOSIS — F6089 Other specific personality disorders: Secondary | ICD-10-CM | POA: Insufficient documentation

## 2015-11-11 DIAGNOSIS — Z539 Procedure and treatment not carried out, unspecified reason: Secondary | ICD-10-CM | POA: Diagnosis not present

## 2015-11-11 DIAGNOSIS — R1013 Epigastric pain: Secondary | ICD-10-CM | POA: Diagnosis not present

## 2015-11-11 DIAGNOSIS — G4733 Obstructive sleep apnea (adult) (pediatric): Secondary | ICD-10-CM | POA: Diagnosis not present

## 2015-11-11 DIAGNOSIS — Z79899 Other long term (current) drug therapy: Secondary | ICD-10-CM | POA: Diagnosis not present

## 2015-11-11 DIAGNOSIS — R634 Abnormal weight loss: Secondary | ICD-10-CM | POA: Insufficient documentation

## 2015-11-11 DIAGNOSIS — K5900 Constipation, unspecified: Secondary | ICD-10-CM | POA: Diagnosis not present

## 2015-11-11 DIAGNOSIS — R12 Heartburn: Secondary | ICD-10-CM | POA: Insufficient documentation

## 2015-11-11 DIAGNOSIS — Z833 Family history of diabetes mellitus: Secondary | ICD-10-CM | POA: Insufficient documentation

## 2015-11-11 DIAGNOSIS — M069 Rheumatoid arthritis, unspecified: Secondary | ICD-10-CM | POA: Insufficient documentation

## 2015-11-11 DIAGNOSIS — F17210 Nicotine dependence, cigarettes, uncomplicated: Secondary | ICD-10-CM | POA: Insufficient documentation

## 2015-11-11 DIAGNOSIS — Z538 Procedure and treatment not carried out for other reasons: Secondary | ICD-10-CM | POA: Diagnosis not present

## 2015-11-11 DIAGNOSIS — Z7982 Long term (current) use of aspirin: Secondary | ICD-10-CM | POA: Insufficient documentation

## 2015-11-11 DIAGNOSIS — K625 Hemorrhage of anus and rectum: Secondary | ICD-10-CM | POA: Insufficient documentation

## 2015-11-11 HISTORY — PX: COLONOSCOPY WITH PROPOFOL: SHX5780

## 2015-11-11 HISTORY — PX: ESOPHAGOGASTRODUODENOSCOPY (EGD) WITH PROPOFOL: SHX5813

## 2015-11-11 HISTORY — DX: Rheumatoid arthritis with rheumatoid factor, unspecified: M05.9

## 2015-11-11 SURGERY — COLONOSCOPY WITH PROPOFOL
Anesthesia: General

## 2015-11-11 MED ORDER — MIDAZOLAM HCL 5 MG/5ML IJ SOLN
INTRAMUSCULAR | Status: DC | PRN
  Administered 2015-11-11: 1 mg via INTRAVENOUS

## 2015-11-11 MED ORDER — PROPOFOL 10 MG/ML IV BOLUS
INTRAVENOUS | Status: DC | PRN
  Administered 2015-11-11: 30 mg via INTRAVENOUS

## 2015-11-11 MED ORDER — PROPOFOL 500 MG/50ML IV EMUL
INTRAVENOUS | Status: DC | PRN
  Administered 2015-11-11: 150 ug/kg/min via INTRAVENOUS

## 2015-11-11 MED ORDER — SODIUM CHLORIDE 0.9 % IV SOLN
INTRAVENOUS | Status: DC
  Administered 2015-11-11: 10:00:00 via INTRAVENOUS

## 2015-11-11 MED ORDER — FENTANYL CITRATE (PF) 100 MCG/2ML IJ SOLN
INTRAMUSCULAR | Status: DC | PRN
  Administered 2015-11-11: 50 ug via INTRAVENOUS

## 2015-11-11 NOTE — Anesthesia Preprocedure Evaluation (Addendum)
Anesthesia Evaluation  Patient identified by MRN, date of birth, ID band Patient awake    Reviewed: Allergy & Precautions, H&P , NPO status , Patient's Chart, lab work & pertinent test results, reviewed documented beta blocker date and time   Airway Mallampati: II   Neck ROM: full    Dental  (+) Poor Dentition, Upper Dentures, Lower Dentures   Pulmonary neg pulmonary ROS, Current Smoker,    Pulmonary exam normal        Cardiovascular hypertension, negative cardio ROS Normal cardiovascular exam     Neuro/Psych negative neurological ROS  negative psych ROS   GI/Hepatic negative GI ROS, Neg liver ROS,   Endo/Other  negative endocrine ROS  Renal/GU negative Renal ROS  negative genitourinary   Musculoskeletal  (+) Arthritis ,   Abdominal   Peds  Hematology negative hematology ROS (+)   Anesthesia Other Findings Past Medical History:   Collagen vascular disease (HCC)                              Hypertension                                                 Rheumatoid arthritis with rheumatoid factor (H*            Past Surgical History:   KNEE RECONSTRUCTION                             Left              BACK SURGERY                                                BMI    Body Mass Index   22.50 kg/m 2     Reproductive/Obstetrics                            Anesthesia Physical Anesthesia Plan  ASA: III  Anesthesia Plan: General   Post-op Pain Management:    Induction:   Airway Management Planned:   Additional Equipment:   Intra-op Plan:   Post-operative Plan:   Informed Consent: I have reviewed the patients History and Physical, chart, labs and discussed the procedure including the risks, benefits and alternatives for the proposed anesthesia with the patient or authorized representative who has indicated his/her understanding and acceptance.   Dental Advisory Given  Plan  Discussed with: CRNA  Anesthesia Plan Comments:         Anesthesia Quick Evaluation

## 2015-11-11 NOTE — Op Note (Signed)
[facility] Gastroenterology Patient Name: Jose Cuevas Procedure Date: 11/11/2015 10:34 AM MRN: [id number] Account #: [id number] Date of Birth: August 27, 1961 Admit Type: Outpatient Age: 55 Room: [facility] ENDO ROOM 2 Gender: Male Note Status: Finalized Procedure:         Upper GI endoscopy Indications:       Epigastric abdominal pain, Dysphagia, Heartburn, Weight                     loss Patient Profile:   This is a 55 year old male. Providers:         Gerrit Heck. Rayann Heman, MD Referring MD:      Leighton Ruff. Turner, RN (Referring MD) Medicines:         Propofol per Anesthesia Complications:     No immediate complications. Procedure:         Pre-Anesthesia Assessment:                    - Prior to the procedure, a History and Physical was                     performed, and patient medications, allergies and                     sensitivities were reviewed. The patient's tolerance of                     previous anesthesia was reviewed.                    After obtaining informed consent, the endoscope was passed                     under direct vision. Throughout the procedure, the                     patient's blood pressure, pulse, and oxygen saturations                     were monitored continuously. The Endoscope was introduced                     through the mouth, with the intention of advancing to the                     stomach. The scope was advanced to the antrum before the                     procedure was aborted. Medications were not given. The                     upper GI endoscopy was accomplished without difficulty.                     The patient tolerated the procedure well. Findings:      The esophagus was normal.      A large amount of food (residue) was found in the gastric body.      - Procedure aborted upon seeing food. Impression:        - Normal esophagus.                    - A large amount of food (residue) in the stomach.                    -  Procedure  aborted upon seeing food.                    - No specimens collected. Recommendation:    - Observe patient in GI recovery unit.                    - Resume regular diet.                    - Continue present medications.                    - Repeat the upper endoscopy due to food in stomach. Clear                     liquids only day before procedure if he did not actually                     eat this am.                    - Reschedule colonoscopy.                    - The findings and recommendations were discussed with the                     patient.                    - The findings and recommendations were discussed with the                     patient's family. Procedure Code(s): --- Professional ---                    [phone], 52, Esophagogastroduodenoscopy, flexible,                     transoral; diagnostic, including collection of specimen(s)                     by brushing or washing, when performed (separate procedure) Diagnosis Code(s): --- Professional ---                    R10.13, Epigastric pain                    R13.10, Dysphagia, unspecified                    R12, Heartburn                    R63.4, Abnormal weight loss CPT copyright 2014 American Medical Association. All rights reserved. The codes documented in this report are preliminary and upon coder review may  be revised to meet current compliance requirements. Mellody Life, MD 11/11/2015 10:58:46 AM This report has been signed electronically. Number of Addenda: 0 Note Initiated On: 11/11/2015 10:34 AM      [facility]

## 2015-11-11 NOTE — H&P (Signed)
Primary Care Physician:  Allyne Gee, MD  Pre-Procedure History & Physical: HPI:  Jose Cuevas is a 55 y.o. male is here for an endoscopy / colonoscopy   Past Medical History  Diagnosis Date  . Collagen vascular disease ([place])   . Hypertension   . Rheumatoid arthritis with rheumatoid factor [facility])     Past Surgical History  Procedure Laterality Date  . Knee reconstruction Left   . Back surgery      Prior to Admission medications   Medication Sig Start Date End Date Taking? Authorizing Provider  aspirin EC 81 MG tablet Take 81 mg by mouth daily.   Yes Historical Provider, MD  Aspirin-Caffeine 1000-65 MG PACK Take by mouth.   Yes Historical Provider, MD  [facility] Liver Oil OIL Take by mouth.   Yes Historical Provider, MD  etanercept (ENBREL) 50 MG/ML injection Inject 50 mg into the skin once a week.   Yes Historical Provider, MD  folic acid (FOLVITE) 1 MG tablet Take 1 mg by mouth daily.   Yes Historical Provider, MD  HYDROcodone-acetaminophen (NORCO) 10-325 MG tablet Take 1 tablet by mouth every 6 (six) hours as needed.   Yes Historical Provider, MD  losartan-hydrochlorothiazide (HYZAAR) 100-25 MG tablet Take 1 tablet by mouth daily.   Yes Historical Provider, MD  meloxicam (MOBIC) 7.5 MG tablet Take 7.5 mg by mouth daily.   Yes Historical Provider, MD  methadone (DOLOPHINE) 10 MG tablet Take 10 mg by mouth every 8 (eight) hours.   Yes Historical Provider, MD  methotrexate (RHEUMATREX) 2.5 MG tablet Take 10 mg by mouth once a week.   Yes Historical Provider, MD  omeprazole (PRILOSEC) 20 MG capsule Take 20 mg by mouth daily.   Yes Historical Provider, MD  rosuvastatin (CRESTOR) 20 MG tablet Take 20 mg by mouth daily.   Yes Historical Provider, MD  tamsulosin (FLOMAX) 0.4 MG CAPS capsule Take 0.4 mg by mouth.   Yes Historical Provider, MD  testosterone (ANDROGEL) 50 MG/5GM (1%) GEL Place 5 g onto the skin daily.   Yes Historical Provider, MD    Allergies as of 11/07/2015  . (No Known  Allergies)    History reviewed. No pertinent family history.  Social History   Social History  . Marital Status: Married    Spouse Name: N/A  . Number of Children: N/A  . Years of Education: N/A   Occupational History  . Not on file.   Social History Main Topics  . Smoking status: Current Some Day Smoker    Types: Cigarettes  . Smokeless tobacco: Former Systems developer  . Alcohol Use: No  . Drug Use: No  . Sexual Activity: Not on file   Other Topics Concern  . Not on file   Social History Narrative     Physical Exam: BP 167/62 mmHg  Pulse 64  Temp(Src) 98.5 F (36.9 C) (Tympanic)  Resp 18  Ht 5\' 8"  (1.727 m)  Wt 67.132 kg (148 lb)  BMI 22.51 kg/m2 General:   Alert,  pleasant and cooperative in NAD Head:  Normocephalic and atraumatic. Neck:  Supple; no masses or thyromegaly. Lungs:  Clear throughout to auscultation.    Heart:  Regular rate and rhythm. Abdomen:  Soft, nontender and nondistended. Normal bowel sounds, without guarding, and without rebound.   Neurologic:  Alert and  oriented x4;  grossly normal neurologically.  Impression/Plan: Jose Cuevas is here for an endoscopy to be performed for GERD, dysphagia, wt loss,  Colon for constipaion, rectal  bleeding  Risks, benefits, limitations, and alternatives regarding  Endoscopy/colonoscopy have been reviewed with the patient.  Questions have been answered.  All parties agreeable.   Jose Class, MD  11/11/2015, 10:30 AM

## 2015-11-11 NOTE — Transfer of Care (Signed)
Immediate Anesthesia Transfer of Care Note  Patient: Jose Cuevas  Procedure(s) Performed: Procedure(s): COLONOSCOPY WITH PROPOFOL (N/A) ESOPHAGOGASTRODUODENOSCOPY (EGD) WITH PROPOFOL (N/A)  Patient Location: PACU and Endoscopy Unit  Anesthesia Type:General  Level of Consciousness: awake, alert  and oriented  Airway & Oxygen Therapy: Patient Spontanous Breathing and Patient connected to nasal cannula oxygen  Post-op Assessment: Report given to RN and Post -op Vital signs reviewed and stable  Post vital signs: Reviewed and stable  Last Vitals:  Filed Vitals:   11/11/15 1100 11/11/15 1103  BP: 149/60   Pulse:    Temp: 35.9 C 36.1 C  Resp:      Complications: No apparent anesthesia complications

## 2015-11-11 NOTE — Anesthesia Postprocedure Evaluation (Signed)
Anesthesia Post Note  Patient: Johnella Moloney  Procedure(s) Performed: Procedure(s) (LRB): COLONOSCOPY WITH PROPOFOL (N/A) ESOPHAGOGASTRODUODENOSCOPY (EGD) WITH PROPOFOL (N/A)  Patient location during evaluation: PACU Anesthesia Type: General Level of consciousness: awake and alert Pain management: pain level controlled Vital Signs Assessment: post-procedure vital signs reviewed and stable Respiratory status: spontaneous breathing, nonlabored ventilation, respiratory function stable and patient connected to nasal cannula oxygen Cardiovascular status: blood pressure returned to baseline and stable Postop Assessment: no signs of nausea or vomiting Anesthetic complications: no    Last Vitals:  Filed Vitals:   11/11/15 1103 11/11/15 1110  BP:  141/60  Pulse:  50  Temp: 36.1 C   Resp:  12    Last Pain: There were no vitals filed for this visit.               Molli Barrows

## 2015-11-11 NOTE — Discharge Instructions (Signed)

## 2015-11-12 ENCOUNTER — Encounter: Payer: Self-pay | Admitting: Gastroenterology

## 2015-11-18 DIAGNOSIS — Z79891 Long term (current) use of opiate analgesic: Secondary | ICD-10-CM | POA: Diagnosis not present

## 2015-11-18 DIAGNOSIS — G894 Chronic pain syndrome: Secondary | ICD-10-CM | POA: Diagnosis not present

## 2015-11-18 DIAGNOSIS — M5137 Other intervertebral disc degeneration, lumbosacral region: Secondary | ICD-10-CM | POA: Diagnosis not present

## 2015-11-25 ENCOUNTER — Ambulatory Visit
Admission: RE | Admit: 2015-11-25 | Discharge: 2015-11-25 | Disposition: A | Discharge status: Home / Self Care | Payer: Medicare Other | Source: Ambulatory Visit | Attending: Gastroenterology | Admitting: Gastroenterology

## 2015-11-25 DIAGNOSIS — R1310 Dysphagia, unspecified: Secondary | ICD-10-CM

## 2015-11-25 MED ORDER — TECHNETIUM TC 99M SULFUR COLLOID
1.8400 | Freq: Once | INTRAVENOUS | Status: AC | PRN
  Administered 2015-11-25: 1.84 via INTRAVENOUS

## 2015-12-01 ENCOUNTER — Encounter: Payer: Self-pay | Admitting: *Deleted

## 2015-12-02 ENCOUNTER — Encounter: Payer: Self-pay | Admitting: Anesthesiology

## 2015-12-02 ENCOUNTER — Ambulatory Visit
Admission: RE | Admit: 2015-12-02 | Discharge: 2015-12-02 | Disposition: A | Discharge status: Home / Self Care | Payer: Medicare Other | Source: Ambulatory Visit | Attending: Gastroenterology | Admitting: Gastroenterology

## 2015-12-02 ENCOUNTER — Encounter
Admission: RE | Disposition: A | Discharge status: Home / Self Care | Payer: Self-pay | Source: Ambulatory Visit | Attending: Gastroenterology

## 2015-12-02 ENCOUNTER — Ambulatory Visit: Payer: Medicare Other | Admitting: Anesthesiology

## 2015-12-02 DIAGNOSIS — K208 Other esophagitis: Secondary | ICD-10-CM | POA: Diagnosis not present

## 2015-12-02 DIAGNOSIS — K210 Gastro-esophageal reflux disease with esophagitis: Secondary | ICD-10-CM | POA: Insufficient documentation

## 2015-12-02 DIAGNOSIS — K209 Esophagitis, unspecified: Secondary | ICD-10-CM | POA: Diagnosis not present

## 2015-12-02 DIAGNOSIS — Z7982 Long term (current) use of aspirin: Secondary | ICD-10-CM | POA: Diagnosis not present

## 2015-12-02 DIAGNOSIS — K3189 Other diseases of stomach and duodenum: Secondary | ICD-10-CM | POA: Diagnosis not present

## 2015-12-02 DIAGNOSIS — M359 Systemic involvement of connective tissue, unspecified: Secondary | ICD-10-CM | POA: Diagnosis not present

## 2015-12-02 DIAGNOSIS — I10 Essential (primary) hypertension: Secondary | ICD-10-CM | POA: Diagnosis not present

## 2015-12-02 DIAGNOSIS — K269 Duodenal ulcer, unspecified as acute or chronic, without hemorrhage or perforation: Secondary | ICD-10-CM | POA: Diagnosis not present

## 2015-12-02 DIAGNOSIS — K625 Hemorrhage of anus and rectum: Secondary | ICD-10-CM | POA: Diagnosis not present

## 2015-12-02 DIAGNOSIS — K5900 Constipation, unspecified: Secondary | ICD-10-CM | POA: Insufficient documentation

## 2015-12-02 DIAGNOSIS — M069 Rheumatoid arthritis, unspecified: Secondary | ICD-10-CM | POA: Diagnosis not present

## 2015-12-02 DIAGNOSIS — K648 Other hemorrhoids: Secondary | ICD-10-CM | POA: Diagnosis not present

## 2015-12-02 DIAGNOSIS — Z79899 Other long term (current) drug therapy: Secondary | ICD-10-CM | POA: Diagnosis not present

## 2015-12-02 DIAGNOSIS — F17210 Nicotine dependence, cigarettes, uncomplicated: Secondary | ICD-10-CM | POA: Diagnosis not present

## 2015-12-02 DIAGNOSIS — K635 Polyp of colon: Secondary | ICD-10-CM | POA: Diagnosis not present

## 2015-12-02 DIAGNOSIS — K2950 Unspecified chronic gastritis without bleeding: Secondary | ICD-10-CM | POA: Insufficient documentation

## 2015-12-02 DIAGNOSIS — K315 Obstruction of duodenum: Secondary | ICD-10-CM | POA: Diagnosis not present

## 2015-12-02 DIAGNOSIS — D120 Benign neoplasm of cecum: Secondary | ICD-10-CM | POA: Insufficient documentation

## 2015-12-02 DIAGNOSIS — K640 First degree hemorrhoids: Secondary | ICD-10-CM | POA: Insufficient documentation

## 2015-12-02 DIAGNOSIS — R1310 Dysphagia, unspecified: Secondary | ICD-10-CM | POA: Diagnosis not present

## 2015-12-02 DIAGNOSIS — K921 Melena: Secondary | ICD-10-CM | POA: Diagnosis not present

## 2015-12-02 DIAGNOSIS — K2960 Other gastritis without bleeding: Secondary | ICD-10-CM | POA: Diagnosis not present

## 2015-12-02 HISTORY — PX: COLONOSCOPY WITH PROPOFOL: SHX5780

## 2015-12-02 HISTORY — PX: ESOPHAGOGASTRODUODENOSCOPY (EGD) WITH PROPOFOL: SHX5813

## 2015-12-02 SURGERY — COLONOSCOPY WITH PROPOFOL
Anesthesia: General

## 2015-12-02 MED ORDER — IPRATROPIUM-ALBUTEROL 0.5-2.5 (3) MG/3ML IN SOLN
3.0000 mL | Freq: Once | RESPIRATORY_TRACT | Status: AC
Start: 2015-12-02 — End: 2015-12-02
  Administered 2015-12-02: 3 mL via RESPIRATORY_TRACT

## 2015-12-02 MED ORDER — MIDAZOLAM HCL 2 MG/2ML IJ SOLN
INTRAMUSCULAR | Status: DC | PRN
  Administered 2015-12-02: 1 mg via INTRAVENOUS

## 2015-12-02 MED ORDER — FENTANYL CITRATE (PF) 100 MCG/2ML IJ SOLN
INTRAMUSCULAR | Status: DC | PRN
  Administered 2015-12-02: 50 ug via INTRAVENOUS

## 2015-12-02 MED ORDER — LIDOCAINE HCL (CARDIAC) 20 MG/ML IV SOLN
INTRAVENOUS | Status: DC | PRN
  Administered 2015-12-02: 40 mg via INTRAVENOUS

## 2015-12-02 MED ORDER — IPRATROPIUM-ALBUTEROL 0.5-2.5 (3) MG/3ML IN SOLN
RESPIRATORY_TRACT | Status: DC
Start: 2015-12-02 — End: 2015-12-02
  Filled 2015-12-02: qty 3

## 2015-12-02 MED ORDER — PROPOFOL 500 MG/50ML IV EMUL
INTRAVENOUS | Status: DC | PRN
  Administered 2015-12-02: 175 ug/kg/min via INTRAVENOUS

## 2015-12-02 MED ORDER — SODIUM CHLORIDE 0.9 % IV SOLN
INTRAVENOUS | Status: DC
  Administered 2015-12-02: 1000 mL via INTRAVENOUS

## 2015-12-02 MED ORDER — PROPOFOL 10 MG/ML IV BOLUS
INTRAVENOUS | Status: DC | PRN
  Administered 2015-12-02 (×2): 50 mg via INTRAVENOUS

## 2015-12-02 NOTE — Discharge Instructions (Signed)

## 2015-12-02 NOTE — Anesthesia Preprocedure Evaluation (Signed)
Anesthesia Evaluation  Patient identified by MRN, date of birth, ID band Patient awake    Reviewed: Allergy & Precautions, H&P , NPO status , Patient's Chart, lab work & pertinent test results  History of Anesthesia Complications Negative for: history of anesthetic complications  Airway Mallampati: III  TM Distance: >3 FB Neck ROM: full    Dental  (+) Poor Dentition, Missing, Upper Dentures   Pulmonary neg shortness of breath, sleep apnea , Current Smoker,    Pulmonary exam normal breath sounds clear to auscultation       Cardiovascular Exercise Tolerance: Good hypertension, (-) angina(-) Past MI and (-) DOE Normal cardiovascular exam Rhythm:regular Rate:Normal     Neuro/Psych negative neurological ROS  negative psych ROS   GI/Hepatic negative GI ROS, Neg liver ROS, neg GERD  ,  Endo/Other  negative endocrine ROS  Renal/GU negative Renal ROS  negative genitourinary   Musculoskeletal  (+) Arthritis ,   Abdominal   Peds  Hematology negative hematology ROS (+)   Anesthesia Other Findings Past Medical History:   Collagen vascular disease (HCC)                              Hypertension                                                 Rheumatoid arthritis with rheumatoid factor (H*             Past Surgical History:   KNEE RECONSTRUCTION                             Left              BACK SURGERY                                                  COLONOSCOPY WITH PROPOFOL                       N/A 11/11/2015      Comment:Procedure: COLONOSCOPY WITH PROPOFOL;  Surgeon:              [name], MD;  Location: [facility]               ENDOSCOPY;  Service: Endoscopy;  Laterality:               N/A;   ESOPHAGOGASTRODUODENOSCOPY (EGD) WITH PROPOFOL  N/A 11/11/2015      Comment:Procedure: ESOPHAGOGASTRODUODENOSCOPY (EGD)               WITH PROPOFOL;  Surgeon: [name],               MD;  Location: [facility]  ENDOSCOPY;  Service:               Endoscopy;  Laterality: N/A;  BMI    Body Mass Index   21.29 kg/m 2      Reproductive/Obstetrics negative OB ROS  Anesthesia Physical Anesthesia Plan  ASA: III  Anesthesia Plan: General   Post-op Pain Management:    Induction:   Airway Management Planned:   Additional Equipment:   Intra-op Plan:   Post-operative Plan:   Informed Consent: I have reviewed the patients History and Physical, chart, labs and discussed the procedure including the risks, benefits and alternatives for the proposed anesthesia with the patient or authorized representative who has indicated his/her understanding and acceptance.   Dental Advisory Given  Plan Discussed with: Anesthesiologist, CRNA and Surgeon  Anesthesia Plan Comments:         Anesthesia Quick Evaluation

## 2015-12-02 NOTE — Anesthesia Postprocedure Evaluation (Signed)
Anesthesia Post Note  Patient: Jose Cuevas  Procedure(s) Performed: Procedure(s) (LRB): COLONOSCOPY WITH PROPOFOL (N/A) ESOPHAGOGASTRODUODENOSCOPY (EGD) WITH PROPOFOL (N/A)  Patient location during evaluation: Endoscopy Anesthesia Type: General Level of consciousness: awake and alert Pain management: pain level controlled Vital Signs Assessment: post-procedure vital signs reviewed and stable Respiratory status: spontaneous breathing, nonlabored ventilation, respiratory function stable and patient connected to nasal cannula oxygen Cardiovascular status: blood pressure returned to baseline and stable Postop Assessment: no signs of nausea or vomiting Anesthetic complications: no    Last Vitals:  Filed Vitals:   12/02/15 1210 12/02/15 1220  BP: 126/70 121/67  Pulse: 67 65  Temp:    Resp: 15 15    Last Pain:  Filed Vitals:   12/02/15 1222  PainSc: 2                  Precious Haws Piscitello

## 2015-12-02 NOTE — Op Note (Signed)
[facility] Gastroenterology Patient Name: Jose Cuevas Procedure Date: 12/02/2015 11:03 AM MRN: [id number] Account #: [id number] Date of Birth: 1961/07/06 Admit Type: Outpatient Age: 55 Room: [facility] ENDO ROOM 2 Gender: Male Note Status: Finalized Procedure:            Upper GI endoscopy Indications:          Dysphagia, Heartburn, Suspected esophageal reflux,                        Weight loss Patient Profile:      This is a 55 year old male. Providers:            Gerrit Heck. Rayann Heman, MD Referring MD:         Allyne Gee, MD (Referring MD) Medicines:            Propofol per Anesthesia Complications:        No immediate complications. Estimated blood loss:                        Minimal. Procedure:            Pre-Anesthesia Assessment:                       - Prior to the procedure, a History and Physical was                        performed, and patient medications, allergies and                        sensitivities were reviewed. The patient's tolerance of                        previous anesthesia was reviewed.                       After obtaining informed consent, the endoscope was                        passed under direct vision. Throughout the procedure,                        the patient's blood pressure, pulse, and oxygen                        saturations were monitored continuously. The Olympus                        GIF-160 endoscope (S#. [id number]) was introduced through                        the mouth, and advanced to the second part of duodenum.                        The upper GI endoscopy was accomplished without                        difficulty. The patient tolerated the procedure well. Findings:      LA Grade C (one or more mucosal breaks continuous between tops of 2 or       more mucosal folds, less than 75% circumference) esophagitis with  no       bleeding was found [cm from incisors]. Biopsies were taken with a cold       forceps for histology.      The stomach was normal.      An acquired benign-appearing, intrinsic moderate stenosis was found in       just past the duodenal bulb with some ulceration and was traversed.       Biopsies were taken with a cold forceps for histology.      Biopsies were taken with a cold forceps in the gastric body and in the       gastric antrum for histology. Impression:           - LA Grade C reflux esophagitis. Biopsied.                       - Normal stomach.                       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                        Biopsied.                       - Biopsies were taken with a cold forceps for histology                        in the gastric body and in the gastric antrum. Recommendation:       - Perform a colonoscopy today.                       - Use Prilosec (omeprazole) 40 mg PO BID for 8 weeks,                        then daily.                       - Await pathology results.                       - The findings and recommendations were discussed with                        the patient.                       - The findings and recommendations were discussed with                        the patient's family.                       - Repeat upper endoscopy in 8 weeks to check healing                        and for possible dilation of duod stricture. Procedure Code(s):    --- Professional ---                       [phone], Esophagogastroduodenoscopy, flexible, transoral;                        with  biopsy, single or multiple Diagnosis Code(s):    --- Professional ---                       K21.0, Gastro-esophageal reflux disease with esophagitis                       K31.5, Obstruction of duodenum                       R13.10, Dysphagia, unspecified                       R12, Heartburn                       R63.4, Abnormal weight loss CPT copyright 2016 American Medical Association. All rights reserved. The codes documented in this report are preliminary and  upon coder review may  be revised to meet current compliance requirements. Mellody Life, MD 12/02/2015 11:20:41 AM This report has been signed electronically. Number of Addenda: 0 Note Initiated On: 12/02/2015 11:03 AM      [facility]

## 2015-12-02 NOTE — Transfer of Care (Signed)
Immediate Anesthesia Transfer of Care Note  Patient: Jose Cuevas  Procedure(s) Performed: Procedure(s): COLONOSCOPY WITH PROPOFOL (N/A) ESOPHAGOGASTRODUODENOSCOPY (EGD) WITH PROPOFOL (N/A)  Patient Location: PACU and Endoscopy Unit  Anesthesia Type:General  Level of Consciousness: sedated  Airway & Oxygen Therapy: Patient Spontanous Breathing and Patient connected to nasal cannula oxygen  Post-op Assessment: Report given to RN and Post -op Vital signs reviewed and stable  Post vital signs: Reviewed and stable  Last Vitals:  Filed Vitals:   12/02/15 1024 12/02/15 1150  BP: 136/70 146/91  Pulse: 65 80  Temp: 36.4 C 35.7 C  Resp: 16 16    Complications: No apparent anesthesia complications

## 2015-12-02 NOTE — Op Note (Signed)
[facility] Gastroenterology Patient Name: Jose Cuevas Procedure Date: 12/02/2015 11:00 AM MRN: [id number] Account #: [id number] Date of Birth: [birth year] Admit Type: Outpatient Age: 55 Room: [facility] ENDO ROOM 2 Gender: Male Note Status: Finalized Procedure:            Colonoscopy Indications:          Hematochezia, Constipation Patient Profile:      This is a 55 year old male. Providers:            Gerrit Heck. Rayann Heman, MD Referring MD:         Allyne Gee, MD (Referring MD) Medicines:            Propofol per Anesthesia Complications:        No immediate complications. Procedure:            Pre-Anesthesia Assessment:                       - Prior to the procedure, a History and Physical was                        performed, and patient medications, allergies and                        sensitivities were reviewed. The patient's tolerance of                        previous anesthesia was reviewed.                       After obtaining informed consent, the colonoscope was                        passed under direct vision. Throughout the procedure,                        the patient's blood pressure, pulse, and oxygen                        saturations were monitored continuously. The                        Colonoscope was introduced through the anus and                        advanced to the the cecum, identified by appendiceal                        orifice and ileocecal valve. The colonoscopy was                        somewhat difficult due to a tortuous colon. Successful                        completion of the procedure was aided by using manual                        pressure. The patient tolerated the procedure well. The                        quality of the bowel preparation was fair. Findings:  The perianal and digital rectal examinations were normal.      A 5 mm polyp was found in the cecum. The polyp was flat. The polyp was       removed with a hot snare.  Resection and retrieval were complete.      Internal hemorrhoids were found during retroflexion. The hemorrhoids       were Grade I (internal hemorrhoids that do not prolapse).      The exam was otherwise without abnormality. Impression:           - One 5 mm polyp in the cecum, removed with a hot                        snare. Resected and retrieved.                       - Internal hemorrhoids.                       - The examination was otherwise normal. Recommendation:       - Observe patient in GI recovery unit.                       - Resume regular diet.                       - Continue present medications.                       - Await pathology results.                       - Repeat colonoscopy in 5 years for surveillance based                        on pathology results, fair prep only                       - Return to GI clinic.                       - The findings and recommendations were discussed with                        the patient.                       - The findings and recommendations were discussed with                        the patient's family. Procedure Code(s):    --- Professional ---                       [phone], Colonoscopy, flexible; with removal of tumor(s),                        polyp(s), or other lesion(s) by snare technique Diagnosis Code(s):    --- Professional ---                       D12.0, Benign neoplasm of cecum  K64.0, First degree hemorrhoids                       K92.1, Melena (includes Hematochezia)                       K59.00, Constipation, unspecified CPT copyright 2016 American Medical Association. All rights reserved. The codes documented in this report are preliminary and upon coder review may  be revised to meet current compliance requirements. Mellody Life, MD 12/02/2015 11:50:10 AM This report has been signed electronically. Number of Addenda: 0 Note Initiated On: 12/02/2015 11:00 AM Scope Withdrawal Time: 0  hours 15 minutes 20 seconds  Total Procedure Duration: 0 hours 23 minutes 36 seconds       [facility] [address]

## 2015-12-02 NOTE — H&P (Signed)
Primary Care Physician:  Allyne Gee, MD  Pre-Procedure History & Physical: HPI:  REMIGIO Cuevas is a 55 y.o. male is here for an EGD / colonoscopy.   Past Medical History  Diagnosis Date  . Collagen vascular disease ([place])   . Hypertension   . Rheumatoid arthritis with rheumatoid factor [facility])     Past Surgical History  Procedure Laterality Date  . Knee reconstruction Left   . Back surgery    . Colonoscopy with propofol N/A 11/11/2015    Procedure: COLONOSCOPY WITH PROPOFOL;  Surgeon: [name], MD;  Location: [facility] ENDOSCOPY;  Service: Endoscopy;  Laterality: N/A;  . Esophagogastroduodenoscopy (egd) with propofol N/A 11/11/2015    Procedure: ESOPHAGOGASTRODUODENOSCOPY (EGD) WITH PROPOFOL;  Surgeon: [name], MD;  Location: [facility] ENDOSCOPY;  Service: Endoscopy;  Laterality: N/A;    Prior to Admission medications   Medication Sig Start Date End Date Taking? Authorizing Provider  aspirin EC 81 MG tablet Take 81 mg by mouth daily.   Yes Historical Provider, MD  Aspirin-Caffeine 1000-65 MG PACK Take by mouth.   Yes Historical Provider, MD  [facility] Liver Oil OIL Take by mouth.   Yes Historical Provider, MD  etanercept (ENBREL) 50 MG/ML injection Inject 50 mg into the skin once a week.   Yes Historical Provider, MD  folic acid (FOLVITE) 1 MG tablet Take 1 mg by mouth daily.   Yes Historical Provider, MD  HYDROcodone-acetaminophen (NORCO) 10-325 MG tablet Take 1 tablet by mouth every 6 (six) hours as needed.   Yes Historical Provider, MD  losartan-hydrochlorothiazide (HYZAAR) 100-25 MG tablet Take 1 tablet by mouth daily.   Yes Historical Provider, MD  meloxicam (MOBIC) 7.5 MG tablet Take 7.5 mg by mouth daily.   Yes Historical Provider, MD  methadone (DOLOPHINE) 10 MG tablet Take 10 mg by mouth every 8 (eight) hours.   Yes Historical Provider, MD  methotrexate (RHEUMATREX) 2.5 MG tablet Take 10 mg by mouth once a week.   Yes Historical Provider, MD  omeprazole (PRILOSEC) 20  MG capsule Take 20 mg by mouth daily.   Yes Historical Provider, MD  rosuvastatin (CRESTOR) 20 MG tablet Take 20 mg by mouth daily.   Yes Historical Provider, MD  tamsulosin (FLOMAX) 0.4 MG CAPS capsule Take 0.4 mg by mouth.   Yes Historical Provider, MD  testosterone (ANDROGEL) 50 MG/5GM (1%) GEL Place 5 g onto the skin daily.   Yes Historical Provider, MD    Allergies as of 11/23/2015  . (No Known Allergies)    History reviewed. No pertinent family history.  Social History   Social History  . Marital Status: Married    Spouse Name: N/A  . Number of Children: N/A  . Years of Education: N/A   Occupational History  . Not on file.   Social History Main Topics  . Smoking status: Current Some Day Smoker    Types: Cigarettes  . Smokeless tobacco: Former Systems developer  . Alcohol Use: No  . Drug Use: No  . Sexual Activity: Not on file   Other Topics Concern  . Not on file   Social History Narrative     Physical Exam: BP 136/70 mmHg  Pulse 65  Temp(Src) 97.5 F (36.4 C) (Tympanic)  Resp 16  Ht 5\' 8"  (1.727 m)  Wt 63.504 kg (140 lb)  BMI 21.29 kg/m2  SpO2 100% General:   Alert,  pleasant and cooperative in NAD Head:  Normocephalic and atraumatic. Neck:  Supple; no masses or thyromegaly.  Lungs:  Clear throughout to auscultation.    Heart:  Regular rate and rhythm. Abdomen:  Soft, nontender and nondistended. Normal bowel sounds, without guarding, and without rebound.   Neurologic:  Alert and  oriented x4;  grossly normal neurologically.  Impression/Plan: Jose Cuevas is here for an endoscopy to be performed for weight loss, GERD, n/v, dysphagia, colon for rectal bleeding and constipation  Risks, benefits, limitations, and alternatives regarding  Endoscopy / colonoscopy have been reviewed with the patient.  Questions have been answered.  All parties agreeable.   [name], MD  12/02/2015, 11:01 AM

## 2015-12-02 NOTE — Anesthesia Procedure Notes (Signed)
Date/Time: 12/02/2015 11:00 AM Performed by: Doreen Salvage Pre-anesthesia Checklist: Patient identified, Emergency Drugs available, Suction available and Patient being monitored Patient Re-evaluated:Patient Re-evaluated prior to inductionOxygen Delivery Method: Nasal cannula Intubation Type: IV induction Dental Injury: Teeth and Oropharynx as per pre-operative assessment  Comments: Nasal cannula with etCO2 monitoring

## 2015-12-05 ENCOUNTER — Encounter: Payer: Self-pay | Admitting: Gastroenterology

## 2015-12-05 LAB — SURGICAL PATHOLOGY

## 2015-12-08 DIAGNOSIS — K315 Obstruction of duodenum: Secondary | ICD-10-CM | POA: Diagnosis not present

## 2015-12-08 DIAGNOSIS — R1084 Generalized abdominal pain: Secondary | ICD-10-CM | POA: Diagnosis not present

## 2015-12-08 DIAGNOSIS — K3184 Gastroparesis: Secondary | ICD-10-CM | POA: Diagnosis not present

## 2015-12-26 DIAGNOSIS — M545 Low back pain: Secondary | ICD-10-CM | POA: Diagnosis not present

## 2015-12-26 DIAGNOSIS — K2950 Unspecified chronic gastritis without bleeding: Secondary | ICD-10-CM | POA: Diagnosis not present

## 2015-12-26 DIAGNOSIS — Z79899 Other long term (current) drug therapy: Secondary | ICD-10-CM | POA: Diagnosis not present

## 2015-12-26 DIAGNOSIS — I2510 Atherosclerotic heart disease of native coronary artery without angina pectoris: Secondary | ICD-10-CM | POA: Diagnosis not present

## 2015-12-26 DIAGNOSIS — N200 Calculus of kidney: Secondary | ICD-10-CM | POA: Diagnosis not present

## 2015-12-26 DIAGNOSIS — G8929 Other chronic pain: Secondary | ICD-10-CM | POA: Diagnosis not present

## 2015-12-26 DIAGNOSIS — M069 Rheumatoid arthritis, unspecified: Secondary | ICD-10-CM | POA: Diagnosis not present

## 2016-01-16 DIAGNOSIS — G894 Chronic pain syndrome: Secondary | ICD-10-CM | POA: Diagnosis not present

## 2016-01-16 DIAGNOSIS — M5137 Other intervertebral disc degeneration, lumbosacral region: Secondary | ICD-10-CM | POA: Diagnosis not present

## 2016-01-16 DIAGNOSIS — M069 Rheumatoid arthritis, unspecified: Secondary | ICD-10-CM | POA: Diagnosis not present

## 2016-01-16 DIAGNOSIS — Z79891 Long term (current) use of opiate analgesic: Secondary | ICD-10-CM | POA: Diagnosis not present

## 2016-01-16 DIAGNOSIS — F329 Major depressive disorder, single episode, unspecified: Secondary | ICD-10-CM | POA: Diagnosis not present

## 2016-01-19 ENCOUNTER — Encounter: Payer: Self-pay | Admitting: *Deleted

## 2016-01-20 ENCOUNTER — Encounter
Admission: RE | Disposition: A | Discharge status: Home / Self Care | Payer: Self-pay | Source: Ambulatory Visit | Attending: Gastroenterology

## 2016-01-20 ENCOUNTER — Ambulatory Visit: Payer: Medicare Other | Admitting: Anesthesiology

## 2016-01-20 ENCOUNTER — Ambulatory Visit
Admission: RE | Admit: 2016-01-20 | Discharge: 2016-01-20 | Disposition: A | Discharge status: Home / Self Care | Payer: Medicare Other | Source: Ambulatory Visit | Attending: Gastroenterology | Admitting: Gastroenterology

## 2016-01-20 ENCOUNTER — Encounter: Payer: Self-pay | Admitting: *Deleted

## 2016-01-20 DIAGNOSIS — K2970 Gastritis, unspecified, without bleeding: Secondary | ICD-10-CM | POA: Diagnosis not present

## 2016-01-20 DIAGNOSIS — Z79899 Other long term (current) drug therapy: Secondary | ICD-10-CM | POA: Insufficient documentation

## 2016-01-20 DIAGNOSIS — I10 Essential (primary) hypertension: Secondary | ICD-10-CM | POA: Insufficient documentation

## 2016-01-20 DIAGNOSIS — G4730 Sleep apnea, unspecified: Secondary | ICD-10-CM | POA: Insufficient documentation

## 2016-01-20 DIAGNOSIS — Z7982 Long term (current) use of aspirin: Secondary | ICD-10-CM | POA: Insufficient documentation

## 2016-01-20 DIAGNOSIS — I998 Other disorder of circulatory system: Secondary | ICD-10-CM | POA: Insufficient documentation

## 2016-01-20 DIAGNOSIS — F17210 Nicotine dependence, cigarettes, uncomplicated: Secondary | ICD-10-CM | POA: Insufficient documentation

## 2016-01-20 DIAGNOSIS — N289 Disorder of kidney and ureter, unspecified: Secondary | ICD-10-CM | POA: Diagnosis not present

## 2016-01-20 DIAGNOSIS — K315 Obstruction of duodenum: Secondary | ICD-10-CM | POA: Insufficient documentation

## 2016-01-20 DIAGNOSIS — M069 Rheumatoid arthritis, unspecified: Secondary | ICD-10-CM | POA: Diagnosis not present

## 2016-01-20 DIAGNOSIS — K209 Esophagitis, unspecified: Secondary | ICD-10-CM | POA: Diagnosis not present

## 2016-01-20 HISTORY — PX: ESOPHAGOGASTRODUODENOSCOPY (EGD) WITH PROPOFOL: SHX5813

## 2016-01-20 HISTORY — DX: Sleep apnea, unspecified: G47.30

## 2016-01-20 HISTORY — DX: Disorder of kidney and ureter, unspecified: N28.9

## 2016-01-20 SURGERY — ESOPHAGOGASTRODUODENOSCOPY (EGD) WITH PROPOFOL
Anesthesia: General

## 2016-01-20 MED ORDER — MIDAZOLAM HCL 2 MG/2ML IJ SOLN
INTRAMUSCULAR | Status: DC | PRN
  Administered 2016-01-20: 1 mg via INTRAVENOUS

## 2016-01-20 MED ORDER — FENTANYL CITRATE (PF) 100 MCG/2ML IJ SOLN
INTRAMUSCULAR | Status: DC | PRN
Start: 2016-01-20 — End: 2016-01-20
  Administered 2016-01-20: 50 ug via INTRAVENOUS

## 2016-01-20 MED ORDER — PROPOFOL 500 MG/50ML IV EMUL
INTRAVENOUS | Status: DC | PRN
  Administered 2016-01-20: 150 ug/kg/min via INTRAVENOUS

## 2016-01-20 MED ORDER — PROPOFOL 10 MG/ML IV BOLUS
INTRAVENOUS | Status: DC | PRN
  Administered 2016-01-20: 60 mg via INTRAVENOUS

## 2016-01-20 MED ORDER — SODIUM CHLORIDE 0.9 % IV SOLN
INTRAVENOUS | Status: DC
  Administered 2016-01-20: 1000 mL via INTRAVENOUS
  Administered 2016-01-20: 12:00:00 via INTRAVENOUS

## 2016-01-20 MED ORDER — LABETALOL HCL 5 MG/ML IV SOLN
INTRAVENOUS | Status: DC | PRN
  Administered 2016-01-20 (×2): 5 mg via INTRAVENOUS

## 2016-01-20 MED ORDER — LABETALOL HCL 5 MG/ML IV SOLN: INTRAVENOUS | Status: DC | PRN

## 2016-01-20 NOTE — Anesthesia Postprocedure Evaluation (Signed)
Anesthesia Post Note  Patient: Jose Cuevas  Procedure(s) Performed: Procedure(s) (LRB): ESOPHAGOGASTRODUODENOSCOPY (EGD) WITH PROPOFOL (N/A)  Patient location during evaluation: PACU Anesthesia Type: General Level of consciousness: awake and alert and oriented Pain management: pain level controlled Vital Signs Assessment: post-procedure vital signs reviewed and stable Respiratory status: spontaneous breathing Cardiovascular status: blood pressure returned to baseline Anesthetic complications: no    Last Vitals:  Filed Vitals:   01/20/16 1250 01/20/16 1300  BP: 148/76 154/84  Pulse: 57 59  Temp:    Resp: 16 18    Last Pain:  Filed Vitals:   01/20/16 1307  PainSc: 6                  Madora Barletta

## 2016-01-20 NOTE — Discharge Instructions (Signed)

## 2016-01-20 NOTE — Transfer of Care (Signed)
Immediate Anesthesia Transfer of Care Note  Patient: Jose Cuevas  Procedure(s) Performed: Procedure(s): ESOPHAGOGASTRODUODENOSCOPY (EGD) WITH PROPOFOL (N/A)  Patient Location: PACU  Anesthesia Type:General  Level of Consciousness: sedated  Airway & Oxygen Therapy: Patient Spontanous Breathing and Patient connected to nasal cannula oxygen  Post-op Assessment: Report given to RN and Post -op Vital signs reviewed and stable  Post vital signs: Reviewed and stable  Last Vitals:  Filed Vitals:   01/20/16 1052  BP: 136/94  Pulse: 61  Temp: 36.2 C  Resp: 16    Complications: No apparent anesthesia complications

## 2016-01-20 NOTE — Op Note (Signed)
[facility] Gastroenterology Patient Name: Jose Cuevas Procedure Date: 01/20/2016 12:00 PM MRN: [id number] Account #: [id number] Date of Birth: 1961/02/09 Admit Type: Outpatient Age: 55 Room: [facility] ENDO ROOM 1 Gender: Male Note Status: Finalized Procedure:            Upper GI endoscopy Indications:          For therapy of duodenal stenosis, Follow-up of                        esophagitis Providers:            Gerrit Heck. Rayann Heman, MD Referring MD:         Allyne Gee, MD (Referring MD) Medicines:            Propofol per Anesthesia Complications:        No immediate complications. Estimated blood loss:                        Minimal. Procedure:            Pre-Anesthesia Assessment:                       - Prior to the procedure, a History and Physical was                        performed, and patient medications, allergies and                        sensitivities were reviewed. The patient's tolerance of                        previous anesthesia was reviewed.                       After obtaining informed consent, the endoscope was                        passed under direct vision. Throughout the procedure,                        the patient's blood pressure, pulse, and oxygen                        saturations were monitored continuously. The Endoscope                        was introduced through the mouth, and advanced to the                        second part of duodenum. The upper GI endoscopy was                        accomplished without difficulty. The patient tolerated                        the procedure well. Findings:      The esophagus was normal. Esophagitis completely healed      The stomach was normal.      An acquired benign-appearing, intrinsic severe stenosis was found in the       duodenal bulb and was traversed after dilation. A TTS  dilator was passed       through the scope. Dilation with an 05-09-09 mm pyloric balloon dilator       was  performed to max of 10 mm. Heme produced at 10 mm. Impression:           - Normal esophagus. Esophagitis completely healed.                       - Normal stomach.                       - Acquired duodenal stenosis. Dilated. Unable to pass                        upper endoscope prior to dilation. Dilated to 10 mm                        with heme production. Stenosis appeared worse compared                        to prior EGD.                       - No specimens collected. Recommendation:       - Observe patient in GI recovery unit.                       - Resume regular diet.                       - Decrease prilosec to 40 mg daily since duod                        strictuure likely fibrotic.                       - May require surgery for duodenal stenosis since has                        worsened despite PPI.                       - The findings and recommendations were discussed with                        the patient.                       - The findings and recommendations were discussed with                        the patient's family. Procedure Code(s):    --- Professional ---                       [phone], Esophagogastroduodenoscopy, flexible, transoral;                        with dilation of gastric/duodenal stricture(s) (eg,                        balloon, bougie) Diagnosis Code(s):    --- Professional ---  K31.5, Obstruction of duodenum                       K20.9, Esophagitis, unspecified CPT copyright 2016 [employer]. All rights reserved. The codes documented in this report are preliminary and upon coder review may  be revised to meet current compliance requirements. Mellody Life, MD 01/20/2016 12:22:30 PM This report has been signed electronically. Number of Addenda: 0 Note Initiated On: 01/20/2016 12:00 PM      [facility]

## 2016-01-20 NOTE — Anesthesia Preprocedure Evaluation (Signed)
Anesthesia Evaluation  Patient identified by MRN, date of birth, ID band Patient awake    Reviewed: Allergy & Precautions, H&P , NPO status , Patient's Chart, lab work & pertinent test results  History of Anesthesia Complications Negative for: history of anesthetic complications  Airway Mallampati: III  TM Distance: >3 FB Neck ROM: full    Dental  (+) Poor Dentition, Missing, Upper Dentures   Pulmonary neg shortness of breath, sleep apnea , Current Smoker,    Pulmonary exam normal breath sounds clear to auscultation       Cardiovascular Exercise Tolerance: Good hypertension, Pt. on medications (-) angina(-) Past MI and (-) DOE Normal cardiovascular exam Rhythm:regular Rate:Normal     Neuro/Psych negative neurological ROS  negative psych ROS   GI/Hepatic negative GI ROS, Neg liver ROS, neg GERD  ,  Endo/Other  negative endocrine ROS  Renal/GU Renal InsufficiencyRenal diseasenegative Renal ROS  negative genitourinary   Musculoskeletal  (+) Arthritis , Rheumatoid disorders,    Abdominal   Peds negative pediatric ROS (+)  Hematology negative hematology ROS (+)   Anesthesia Other Findings Past Medical History:   Collagen vascular disease (H[facility])                              Hypertension                                                 Rheumatoid arthritis with rheumatoid factor ([facility]*             Past Surgical History:   KNEE RECONSTRUCTION                             Left              BACK SURGERY                                                  COLONOSCOPY WITH PROPOFOL                       N/A 11/11/2015      Comment:Procedure: COLONOSCOPY WITH PROPOFOL;  Surgeon:              [name], MD;  Location: [facility]               ENDOSCOPY;  Service: Endoscopy;  Laterality:               N/A;   ESOPHAGOGASTRODUODENOSCOPY (EGD) WITH PROPOFOL  N/A 11/11/2015      Comment:Procedure: ESOPHAGOGASTRODUODENOSCOPY (EGD)               WITH PROPOFOL;  Surgeon: [name],               MD;  Location: [facility] ENDOSCOPY;  Service:               Endoscopy;  Laterality: N/A;  BMI    Body Mass Index   21.29 kg/m 2      Reproductive/Obstetrics negative OB ROS  Anesthesia Physical  Anesthesia Plan  ASA: III  Anesthesia Plan: General   Post-op Pain Management:    Induction: Intravenous  Airway Management Planned: Nasal Cannula  Additional Equipment:   Intra-op Plan:   Post-operative Plan:   Informed Consent: I have reviewed the patients History and Physical, chart, labs and discussed the procedure including the risks, benefits and alternatives for the proposed anesthesia with the patient or authorized representative who has indicated his/her understanding and acceptance.   Dental advisory given  Plan Discussed with: CRNA and Surgeon  Anesthesia Plan Comments:        Anesthesia Quick Evaluation

## 2016-01-20 NOTE — H&P (Signed)
Primary Care Physician:  Allyne Gee, MD  Pre-Procedure History & Physical: HPI:  Jose Cuevas is a 55 y.o. male is here for an endoscopy.   Past Medical History  Diagnosis Date  . Collagen vascular disease ([place])   . Hypertension   . Rheumatoid arthritis with rheumatoid factor ([facility])   . Sleep apnea   . Low kidney function     Past Surgical History  Procedure Laterality Date  . Knee reconstruction Left   . Back surgery    . Colonoscopy with propofol N/A 11/11/2015    Procedure: COLONOSCOPY WITH PROPOFOL;  Surgeon: [name], MD;  Location: [facility] ENDOSCOPY;  Service: Endoscopy;  Laterality: N/A;  . Esophagogastroduodenoscopy (egd) with propofol N/A 11/11/2015    Procedure: ESOPHAGOGASTRODUODENOSCOPY (EGD) WITH PROPOFOL;  Surgeon: [name], MD;  Location: [facility] ENDOSCOPY;  Service: Endoscopy;  Laterality: N/A;  . Colonoscopy with propofol N/A 12/02/2015    Procedure: COLONOSCOPY WITH PROPOFOL;  Surgeon: [name], MD;  Location: [facility] ENDOSCOPY;  Service: Endoscopy;  Laterality: N/A;  . Esophagogastroduodenoscopy (egd) with propofol N/A 12/02/2015    Procedure: ESOPHAGOGASTRODUODENOSCOPY (EGD) WITH PROPOFOL;  Surgeon: [name], MD;  Location: [facility] ENDOSCOPY;  Service: Endoscopy;  Laterality: N/A;    Prior to Admission medications   Medication Sig Start Date End Date Taking? Authorizing Provider  methadone (DOLOPHINE) 10 MG tablet Take 10 mg by mouth every 8 (eight) hours.   Yes Historical Provider, MD  omeprazole (PRILOSEC) 20 MG capsule Take 20 mg by mouth daily.   Yes Historical Provider, MD  aspirin EC 81 MG tablet Take 81 mg by mouth daily.    Historical Provider, MD  Aspirin-Caffeine 1000-65 MG PACK Take by mouth.    Historical Provider, MD  [facility] Liver Oil OIL Take by mouth.    Historical Provider, MD  etanercept (ENBREL) 50 MG/ML injection Inject 50 mg into the skin once a week.    Historical Provider, MD  folic acid (FOLVITE) 1 MG tablet  Take 1 mg by mouth daily.    Historical Provider, MD  HYDROcodone-acetaminophen (NORCO) 10-325 MG tablet Take 1 tablet by mouth every 6 (six) hours as needed.    Historical Provider, MD  losartan-hydrochlorothiazide (HYZAAR) 100-25 MG tablet Take 1 tablet by mouth daily.    Historical Provider, MD  meloxicam (MOBIC) 7.5 MG tablet Take 7.5 mg by mouth daily.    Historical Provider, MD  methotrexate (RHEUMATREX) 2.5 MG tablet Take 10 mg by mouth once a week.    Historical Provider, MD  rosuvastatin (CRESTOR) 20 MG tablet Take 20 mg by mouth daily.    Historical Provider, MD  tamsulosin (FLOMAX) 0.4 MG CAPS capsule Take 0.4 mg by mouth.    Historical Provider, MD  testosterone (ANDROGEL) 50 MG/5GM (1%) GEL Place 5 g onto the skin daily.    Historical Provider, MD    Allergies as of 01/13/2016  . (No Known Allergies)    History reviewed. No pertinent family history.  Social History   Social History  . Marital Status: Married    Spouse Name: N/A  . Number of Children: N/A  . Years of Education: N/A   Occupational History  . Not on file.   Social History Main Topics  . Smoking status: Current Some Day Smoker    Types: Cigarettes  . Smokeless tobacco: Former Systems developer  . Alcohol Use: No  . Drug Use: No  . Sexual Activity: Not on file   Other Topics Concern  .  Not on file   Social History Narrative     Physical Exam: BP 136/94 mmHg  Pulse 61  Temp(Src) 97.1 F (36.2 C) (Tympanic)  Resp 16  Ht 5\' 8"  (1.727 m)  Wt 72.122 kg (159 lb)  BMI 24.18 kg/m2  SpO2 100% General:   Alert,  pleasant and cooperative in NAD Head:  Normocephalic and atraumatic. Neck:  Supple; no masses or thyromegaly. Lungs:  Clear throughout to auscultation.    Heart:  Regular rate and rhythm. Abdomen:  Soft, nontender and nondistended. Normal bowel sounds, without guarding, and without rebound.   Neurologic:  Alert and  oriented x4;  grossly normal neurologically.  Impression/Plan: Jose Cuevas is  here for an endoscopy to be performed for f/u esophagitis, duod stricture  Risks, benefits, limitations, and alternatives regarding  endoscopy have been reviewed with the patient.  Questions have been answered.  All parties agreeable.   [name], MD  01/20/2016, 12:05 PM

## 2016-01-25 ENCOUNTER — Encounter: Payer: Self-pay | Admitting: Gastroenterology

## 2016-02-08 DIAGNOSIS — F4542 Pain disorder with related psychological factors: Secondary | ICD-10-CM | POA: Diagnosis not present

## 2016-02-14 DIAGNOSIS — M5137 Other intervertebral disc degeneration, lumbosacral region: Secondary | ICD-10-CM | POA: Diagnosis not present

## 2016-02-14 DIAGNOSIS — M069 Rheumatoid arthritis, unspecified: Secondary | ICD-10-CM | POA: Diagnosis not present

## 2016-02-14 DIAGNOSIS — G894 Chronic pain syndrome: Secondary | ICD-10-CM | POA: Diagnosis not present

## 2016-02-14 DIAGNOSIS — Z79891 Long term (current) use of opiate analgesic: Secondary | ICD-10-CM | POA: Diagnosis not present

## 2016-03-19 DIAGNOSIS — Z79891 Long term (current) use of opiate analgesic: Secondary | ICD-10-CM | POA: Diagnosis not present

## 2016-03-19 DIAGNOSIS — G894 Chronic pain syndrome: Secondary | ICD-10-CM | POA: Diagnosis not present

## 2016-03-19 DIAGNOSIS — M069 Rheumatoid arthritis, unspecified: Secondary | ICD-10-CM | POA: Diagnosis not present

## 2016-03-19 DIAGNOSIS — M5137 Other intervertebral disc degeneration, lumbosacral region: Secondary | ICD-10-CM | POA: Diagnosis not present

## 2016-04-10 DIAGNOSIS — M069 Rheumatoid arthritis, unspecified: Secondary | ICD-10-CM | POA: Diagnosis not present

## 2016-04-10 DIAGNOSIS — R079 Chest pain, unspecified: Secondary | ICD-10-CM | POA: Diagnosis not present

## 2016-04-10 DIAGNOSIS — K3189 Other diseases of stomach and duodenum: Secondary | ICD-10-CM | POA: Diagnosis not present

## 2016-04-17 DIAGNOSIS — G894 Chronic pain syndrome: Secondary | ICD-10-CM | POA: Diagnosis not present

## 2016-04-17 DIAGNOSIS — F329 Major depressive disorder, single episode, unspecified: Secondary | ICD-10-CM | POA: Diagnosis not present

## 2016-04-17 DIAGNOSIS — M5137 Other intervertebral disc degeneration, lumbosacral region: Secondary | ICD-10-CM | POA: Diagnosis not present

## 2016-04-17 DIAGNOSIS — Z79891 Long term (current) use of opiate analgesic: Secondary | ICD-10-CM | POA: Diagnosis not present

## 2016-04-30 DIAGNOSIS — M545 Low back pain: Secondary | ICD-10-CM | POA: Diagnosis not present

## 2016-04-30 DIAGNOSIS — G8929 Other chronic pain: Secondary | ICD-10-CM | POA: Diagnosis not present

## 2016-04-30 DIAGNOSIS — M069 Rheumatoid arthritis, unspecified: Secondary | ICD-10-CM | POA: Diagnosis not present

## 2016-05-18 DIAGNOSIS — G894 Chronic pain syndrome: Secondary | ICD-10-CM | POA: Diagnosis not present

## 2016-05-18 DIAGNOSIS — M5137 Other intervertebral disc degeneration, lumbosacral region: Secondary | ICD-10-CM | POA: Diagnosis not present

## 2016-05-18 DIAGNOSIS — Z79891 Long term (current) use of opiate analgesic: Secondary | ICD-10-CM | POA: Diagnosis not present

## 2016-07-24 DIAGNOSIS — G894 Chronic pain syndrome: Secondary | ICD-10-CM | POA: Diagnosis not present

## 2016-07-24 DIAGNOSIS — Z87891 Personal history of nicotine dependence: Secondary | ICD-10-CM | POA: Diagnosis not present

## 2016-07-24 DIAGNOSIS — M5137 Other intervertebral disc degeneration, lumbosacral region: Secondary | ICD-10-CM | POA: Diagnosis not present

## 2016-07-24 DIAGNOSIS — Z79891 Long term (current) use of opiate analgesic: Secondary | ICD-10-CM | POA: Diagnosis not present

## 2016-07-31 DIAGNOSIS — N200 Calculus of kidney: Secondary | ICD-10-CM | POA: Diagnosis not present

## 2016-07-31 DIAGNOSIS — M069 Rheumatoid arthritis, unspecified: Secondary | ICD-10-CM | POA: Diagnosis not present

## 2016-08-09 DIAGNOSIS — Z23 Encounter for immunization: Secondary | ICD-10-CM | POA: Diagnosis not present

## 2016-09-19 DIAGNOSIS — M5137 Other intervertebral disc degeneration, lumbosacral region: Secondary | ICD-10-CM | POA: Diagnosis not present

## 2016-09-19 DIAGNOSIS — Z79891 Long term (current) use of opiate analgesic: Secondary | ICD-10-CM | POA: Diagnosis not present

## 2016-09-19 DIAGNOSIS — G894 Chronic pain syndrome: Secondary | ICD-10-CM | POA: Diagnosis not present

## 2016-10-23 DIAGNOSIS — Z79891 Long term (current) use of opiate analgesic: Secondary | ICD-10-CM | POA: Diagnosis not present

## 2016-10-23 DIAGNOSIS — M1990 Unspecified osteoarthritis, unspecified site: Secondary | ICD-10-CM | POA: Diagnosis not present

## 2016-10-23 DIAGNOSIS — M5137 Other intervertebral disc degeneration, lumbosacral region: Secondary | ICD-10-CM | POA: Diagnosis not present

## 2016-10-23 DIAGNOSIS — F329 Major depressive disorder, single episode, unspecified: Secondary | ICD-10-CM | POA: Diagnosis not present

## 2016-10-23 DIAGNOSIS — M069 Rheumatoid arthritis, unspecified: Secondary | ICD-10-CM | POA: Diagnosis not present

## 2016-10-23 DIAGNOSIS — G8929 Other chronic pain: Secondary | ICD-10-CM | POA: Diagnosis not present

## 2016-11-26 DIAGNOSIS — F17200 Nicotine dependence, unspecified, uncomplicated: Secondary | ICD-10-CM | POA: Diagnosis not present

## 2016-11-26 DIAGNOSIS — Z79891 Long term (current) use of opiate analgesic: Secondary | ICD-10-CM | POA: Diagnosis not present

## 2016-11-26 DIAGNOSIS — M5137 Other intervertebral disc degeneration, lumbosacral region: Secondary | ICD-10-CM | POA: Diagnosis not present

## 2016-11-26 DIAGNOSIS — M1990 Unspecified osteoarthritis, unspecified site: Secondary | ICD-10-CM | POA: Diagnosis not present

## 2016-11-26 DIAGNOSIS — G894 Chronic pain syndrome: Secondary | ICD-10-CM | POA: Diagnosis not present

## 2016-12-24 DIAGNOSIS — Z79891 Long term (current) use of opiate analgesic: Secondary | ICD-10-CM | POA: Diagnosis not present

## 2016-12-24 DIAGNOSIS — G894 Chronic pain syndrome: Secondary | ICD-10-CM | POA: Diagnosis not present

## 2017-01-28 DIAGNOSIS — G894 Chronic pain syndrome: Secondary | ICD-10-CM | POA: Diagnosis not present

## 2017-01-28 DIAGNOSIS — M5137 Other intervertebral disc degeneration, lumbosacral region: Secondary | ICD-10-CM | POA: Diagnosis not present

## 2017-01-28 DIAGNOSIS — Z79891 Long term (current) use of opiate analgesic: Secondary | ICD-10-CM | POA: Diagnosis not present

## 2017-01-28 DIAGNOSIS — M1990 Unspecified osteoarthritis, unspecified site: Secondary | ICD-10-CM | POA: Diagnosis not present

## 2017-01-30 DIAGNOSIS — M069 Rheumatoid arthritis, unspecified: Secondary | ICD-10-CM | POA: Diagnosis not present

## 2017-01-30 DIAGNOSIS — Z79899 Other long term (current) drug therapy: Secondary | ICD-10-CM | POA: Diagnosis not present

## 2017-01-30 DIAGNOSIS — M25562 Pain in left knee: Secondary | ICD-10-CM | POA: Diagnosis not present

## 2017-01-30 DIAGNOSIS — G8929 Other chronic pain: Secondary | ICD-10-CM | POA: Diagnosis not present

## 2017-03-28 DIAGNOSIS — G894 Chronic pain syndrome: Secondary | ICD-10-CM | POA: Diagnosis not present

## 2017-03-28 DIAGNOSIS — M5137 Other intervertebral disc degeneration, lumbosacral region: Secondary | ICD-10-CM | POA: Diagnosis not present

## 2017-05-30 DIAGNOSIS — G8929 Other chronic pain: Secondary | ICD-10-CM | POA: Diagnosis not present

## 2017-05-30 DIAGNOSIS — I10 Essential (primary) hypertension: Secondary | ICD-10-CM | POA: Diagnosis not present

## 2017-05-30 DIAGNOSIS — M5441 Lumbago with sciatica, right side: Secondary | ICD-10-CM | POA: Diagnosis not present

## 2017-05-30 DIAGNOSIS — M5442 Lumbago with sciatica, left side: Secondary | ICD-10-CM | POA: Diagnosis not present

## 2017-05-30 DIAGNOSIS — Z79899 Other long term (current) drug therapy: Secondary | ICD-10-CM | POA: Diagnosis not present

## 2017-06-25 DIAGNOSIS — G8929 Other chronic pain: Secondary | ICD-10-CM | POA: Diagnosis not present

## 2017-06-25 DIAGNOSIS — M5441 Lumbago with sciatica, right side: Secondary | ICD-10-CM | POA: Diagnosis not present

## 2017-06-25 DIAGNOSIS — Z79899 Other long term (current) drug therapy: Secondary | ICD-10-CM | POA: Diagnosis not present

## 2017-06-25 DIAGNOSIS — G4730 Sleep apnea, unspecified: Secondary | ICD-10-CM | POA: Diagnosis not present

## 2017-06-25 DIAGNOSIS — M5442 Lumbago with sciatica, left side: Secondary | ICD-10-CM | POA: Diagnosis not present

## 2017-06-26 DIAGNOSIS — Z79899 Other long term (current) drug therapy: Secondary | ICD-10-CM | POA: Diagnosis not present

## 2017-06-26 DIAGNOSIS — M069 Rheumatoid arthritis, unspecified: Secondary | ICD-10-CM | POA: Diagnosis not present

## 2017-07-23 DIAGNOSIS — M5442 Lumbago with sciatica, left side: Secondary | ICD-10-CM | POA: Diagnosis not present

## 2017-07-23 DIAGNOSIS — M5441 Lumbago with sciatica, right side: Secondary | ICD-10-CM | POA: Diagnosis not present

## 2017-07-23 DIAGNOSIS — R1084 Generalized abdominal pain: Secondary | ICD-10-CM | POA: Diagnosis not present

## 2017-07-23 DIAGNOSIS — Z79899 Other long term (current) drug therapy: Secondary | ICD-10-CM | POA: Diagnosis not present

## 2017-08-02 DIAGNOSIS — M069 Rheumatoid arthritis, unspecified: Secondary | ICD-10-CM | POA: Diagnosis not present

## 2017-08-02 DIAGNOSIS — Z79899 Other long term (current) drug therapy: Secondary | ICD-10-CM | POA: Diagnosis not present

## 2017-08-02 DIAGNOSIS — N200 Calculus of kidney: Secondary | ICD-10-CM | POA: Diagnosis not present

## 2017-08-21 DIAGNOSIS — Z79899 Other long term (current) drug therapy: Secondary | ICD-10-CM | POA: Diagnosis not present

## 2017-08-21 DIAGNOSIS — L739 Follicular disorder, unspecified: Secondary | ICD-10-CM | POA: Diagnosis not present

## 2017-09-18 DIAGNOSIS — Z79899 Other long term (current) drug therapy: Secondary | ICD-10-CM | POA: Diagnosis not present

## 2017-09-18 DIAGNOSIS — Z23 Encounter for immunization: Secondary | ICD-10-CM | POA: Diagnosis not present

## 2017-09-18 DIAGNOSIS — I10 Essential (primary) hypertension: Secondary | ICD-10-CM | POA: Diagnosis not present

## 2017-09-18 DIAGNOSIS — G8929 Other chronic pain: Secondary | ICD-10-CM | POA: Diagnosis not present

## 2017-09-18 DIAGNOSIS — M5442 Lumbago with sciatica, left side: Secondary | ICD-10-CM | POA: Diagnosis not present

## 2017-09-18 DIAGNOSIS — M5441 Lumbago with sciatica, right side: Secondary | ICD-10-CM | POA: Diagnosis not present

## 2017-11-02 IMAGING — NM NM GASTRIC EMPTYING
1 series · 10 of 10 positions shown · non-contrast
Comparison: CT 10/12/2015

CLINICAL DATA: 40 pound weight loss over 2 months, dysphagia, acid
reflux.

EXAM:
NUCLEAR MEDICINE GASTRIC EMPTYING SCAN
TECHNIQUE: After oral ingestion of radiolabeled meal, sequential abdominal
images were obtained for 4 hours. Percentage of activity emptying
the stomach was calculated at 1 hour, 2 hour, 3 hour, and 4 hours.
RADIOPHARMACEUTICALS:  1.84 mCi Uc-TTm MDP labeled sulfur colloid
orally

[Series 1000: gatric statics (results) · 3.90mm/px · 5 acquisitions, 10 frames shown]
[im 1/5]
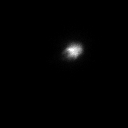
[im 1/5]
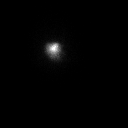
[im 2/5]
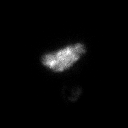
[im 2/5]
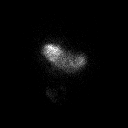
[im 3/5]
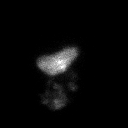
[im 3/5]
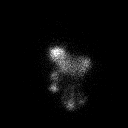
[im 4/5]
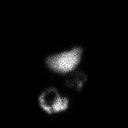
[im 4/5]
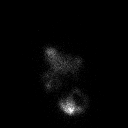
[im 5/5]
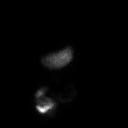
[im 5/5]
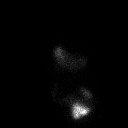

[10 of 10 positions shown; findings below may reference images not displayed]

FINDINGS: Expected location of the stomach in the left upper quadrant.
Ingested meal empties the stomach gradually over the course of the
study.

4% emptied at 1 hr ( normal >= 10%)

21% emptied at 2 hr ( normal >= 40%)

46% emptied at 3 hr ( normal >= 70%)

59% emptied at 4 hr ( normal >= 90%)
IMPRESSION: Delayed gastric emptying study.

## 2018-01-30 DIAGNOSIS — I739 Peripheral vascular disease, unspecified: Secondary | ICD-10-CM | POA: Insufficient documentation

## 2018-04-17 ENCOUNTER — Ambulatory Visit (INDEPENDENT_AMBULATORY_CARE_PROVIDER_SITE_OTHER): Payer: Medicare Other | Admitting: Adult Health

## 2018-04-17 ENCOUNTER — Encounter: Payer: Self-pay | Admitting: Adult Health

## 2018-04-17 VITALS — BP 151/99 | HR 59 | Resp 16 | Ht 68.0 in | Wt 143.6 lb

## 2018-04-17 DIAGNOSIS — B3789 Other sites of candidiasis: Secondary | ICD-10-CM

## 2018-04-17 DIAGNOSIS — I10 Essential (primary) hypertension: Secondary | ICD-10-CM

## 2018-04-17 DIAGNOSIS — Z125 Encounter for screening for malignant neoplasm of prostate: Secondary | ICD-10-CM

## 2018-04-17 DIAGNOSIS — S30861A Insect bite (nonvenomous) of abdominal wall, initial encounter: Secondary | ICD-10-CM

## 2018-04-17 DIAGNOSIS — M0579 Rheumatoid arthritis with rheumatoid factor of multiple sites without organ or systems involvement: Secondary | ICD-10-CM | POA: Diagnosis not present

## 2018-04-17 DIAGNOSIS — F17200 Nicotine dependence, unspecified, uncomplicated: Secondary | ICD-10-CM

## 2018-04-17 DIAGNOSIS — Z0001 Encounter for general adult medical examination with abnormal findings: Secondary | ICD-10-CM

## 2018-04-17 DIAGNOSIS — W57XXXA Bitten or stung by nonvenomous insect and other nonvenomous arthropods, initial encounter: Secondary | ICD-10-CM

## 2018-04-17 DIAGNOSIS — E782 Mixed hyperlipidemia: Secondary | ICD-10-CM

## 2018-04-17 DIAGNOSIS — K315 Obstruction of duodenum: Secondary | ICD-10-CM

## 2018-04-17 MED ORDER — KETOCONAZOLE 2 % EX CREA: 1.0000 "application " | TOPICAL_CREAM | Freq: Two times a day (BID) | CUTANEOUS | 0 refills | Status: DC

## 2018-04-17 MED ORDER — KETOCONAZOLE 2 % EX CREA: TOPICAL_CREAM | Freq: Two times a day (BID) | CUTANEOUS | Status: DC

## 2018-04-17 NOTE — Progress Notes (Signed)
[facility] [place], [place] [address]  Internal MEDICINE  Office Visit Note  Patient Name: Jose Cuevas  [id number]  [id number]  Date of Service: 04/22/2018  Chief Complaint  Patient presents with  . Annual Exam  . Rheumatoid Arthritis  . Gastroesophageal Reflux  . Hyperlipidemia  . Hypertension   HPI Pt is here for routine health maintenance examination.  He reports he has been generally in good health since last visit.  He reports and episode of myalgia and "joints locking up" about one month ago.  This episode lasted overnight and was preceded by finding approxamately 25 Ticks on his body.  He reports he is feeling better now, but it took about a week to fully recover.  He denies fever, rash or other symptoms. H/O atherosclerotic plaque of distal aorta and L common iliac artery. Was followed by vascular at one point. Followed by rheumatology for RA  Current Medication: Outpatient Encounter Medications as of 04/17/2018  Medication Sig  . esomeprazole (NEXIUM) 40 MG capsule Take 40 mg by mouth daily at 12 noon.  . folic acid (FOLVITE) 1 MG tablet Take 1 mg by mouth daily.  Marland Kitchen HYDROcodone-acetaminophen (NORCO) 10-325 MG tablet Take 1 tablet by mouth every 6 (six) hours as needed.  . methadone (DOLOPHINE) 10 MG tablet Take 10 mg by mouth every 8 (eight) hours.  . methotrexate 50 MG/2ML injection Inject 50 mg/m2 into the vein once. 0.83mls once weekly  . aspirin EC 81 MG tablet Take 81 mg by mouth daily.  . Aspirin-Caffeine 1000-65 MG PACK Take by mouth.  . Cod Liver Oil OIL Take by mouth.  . etanercept (ENBREL) 50 MG/ML injection Inject 50 mg into the skin once a week.  Marland Kitchen ketoconazole (NIZORAL) 2 % cream Apply 1 application topically 2 (two) times daily.  Marland Kitchen losartan-hydrochlorothiazide (HYZAAR) 100-25 MG tablet Take 1 tablet by mouth daily.  . meloxicam (MOBIC) 7.5 MG tablet Take 7.5 mg by mouth daily.  . methotrexate (RHEUMATREX) 2.5 MG tablet Take 10 mg  by mouth once a week.  Marland Kitchen omeprazole (PRILOSEC) 20 MG capsule Take 20 mg by mouth daily.  . rosuvastatin (CRESTOR) 20 MG tablet Take 20 mg by mouth daily.  . tamsulosin (FLOMAX) 0.4 MG CAPS capsule Take 0.4 mg by mouth.  . testosterone (ANDROGEL) 50 MG/5GM (1%) GEL Place 5 g onto the skin daily.  . [DISCONTINUED] ketoconazole (NIZORAL) 2 % cream    No facility-administered encounter medications on file as of 04/17/2018.    Surgical History: Past Surgical History:  Procedure Laterality Date  . BACK SURGERY    . COLONOSCOPY WITH PROPOFOL N/A 11/11/2015   Procedure: COLONOSCOPY WITH PROPOFOL;  Surgeon: [name], MD;  Location[facility] ENDOSCOPY;  Service: Endoscopy;  Laterality: N/A;  . COLONOSCOPY WITH PROPOFOL N/A 12/02/2015   Procedure: COLONOSCOPY WITH PROPOFOL;  Surgeon: [name], MD;  Location: [facility] ENDOSCOPY;  Service: Endoscopy;  Laterality: N/A;  . ESOPHAGOGASTRODUODENOSCOPY (EGD) WITH PROPOFOL N/A 11/11/2015   Procedure: ESOPHAGOGASTRODUODENOSCOPY (EGD) WITH PROPOFOL;  Surgeon: [name], MD;  Location: [facility] ENDOSCOPY;  Service: Endoscopy;  Laterality: N/A;  . ESOPHAGOGASTRODUODENOSCOPY (EGD) WITH PROPOFOL N/A 12/02/2015   Procedure: ESOPHAGOGASTRODUODENOSCOPY (EGD) WITH PROPOFOL;  Surgeon: [name], MD;  [facility] ENDOSCOPY;  Service: Endoscopy;  Laterality: N/A;  . ESOPHAGOGASTRODUODENOSCOPY (EGD) WITH PROPOFOL N/A 01/20/2016   Procedure: ESOPHAGOGASTRODUODENOSCOPY (EGD) WITH PROPOFOL;  Surgeon: [name], MD;  Location: [facility] ENDOSCOPY;  Service: Endoscopy;  Laterality: N/A;  .  KNEE RECONSTRUCTION Left    Medical History: Past Medical History:  Diagnosis Date  . Collagen vascular disease ([place])   . Hypertension   . Low kidney function   . Rheumatoid arthritis with rheumatoid factor (HCC)   . Sleep apnea    Family History: History reviewed. No pertinent family history.  Review of Systems  Constitutional: Negative.  Negative  for chills, fatigue and unexpected weight change.  HENT: Negative.  Negative for congestion, rhinorrhea, sneezing and sore throat.   Eyes: Negative for redness.  Respiratory: Negative.  Negative for cough, chest tightness and shortness of breath.   Cardiovascular: Negative.  Negative for chest pain and palpitations.  Gastrointestinal: Negative.  Negative for abdominal pain, constipation, diarrhea, nausea and vomiting.  Endocrine: Negative.   Genitourinary: Negative.  Negative for dysuria and frequency.  Musculoskeletal: Negative.  Negative for arthralgias, back pain, joint swelling and neck pain.  Skin: Negative.  Negative for rash.  Allergic/Immunologic: Negative.   Neurological: Negative.  Negative for tremors and numbness.  Hematological: Negative for adenopathy. Does not bruise/bleed easily.  Psychiatric/Behavioral: Negative.  Negative for behavioral problems, sleep disturbance and suicidal ideas. The patient is not nervous/anxious.    Vital Signs: BP (!) 151/99   Pulse (!) 59   Resp 16   Ht 5\' 8"  (1.727 m)   Wt 143 lb 9.6 oz (65.1 kg)   SpO2 99%   BMI 21.83 kg/m   Physical Exam  Constitutional: He is oriented to person, place, and time. He appears well-developed and well-nourished. No distress.  HENT:  Head: Normocephalic and atraumatic.  Mouth/Throat: Oropharynx is clear and moist. No oropharyngeal exudate.  Eyes: Pupils are equal, round, and reactive to light. EOM are normal.  Neck: Normal range of motion. Neck supple. No JVD present. No tracheal deviation present. No thyromegaly present.  Cardiovascular: Normal rate, regular rhythm and normal heart sounds. Exam reveals no gallop and no friction rub.  No murmur heard. Pulmonary/Chest: Effort normal and breath sounds normal. No respiratory distress. He has no wheezes. He has no rales. He exhibits no tenderness.  Abdominal: Soft. There is no tenderness. There is no guarding.  Musculoskeletal: Normal range of motion.   Lymphadenopathy:    He has no cervical adenopathy.  Neurological: He is alert and oriented to person, place, and time. No cranial nerve deficit.  Skin: Skin is warm and dry. He is not diaphoretic.  Psychiatric: He has a normal mood and affect. His behavior is normal. Judgment and thought content normal.  Nursing note and vitals reviewed.  Assessment/Plan: 1. Encounter for general adult medical examination with abnormal findings - Updated PHM  2. Tick bite of abdominal wall, initial encounter Hx of 25+ tick bites in last month.  - CBC with Differential/Platelet  3. Candida rash of groin Use cream as prescribed. Encouraged pt to keep area dry, and leave open to air when possible.  - ketoconazole (NIZORAL) 2 % cream  4. Tobacco dependence Smoking cessation counseling: 1. Pt acknowledges the risks of long term smoking, he will try to quite smoking. 2. Options for different medications including nicotine products, chewing gum, patch etc, Wellbutrin and Chantix is discussed 3. Goal and date of compete cessation is discussed 4. Total time spent in smoking cessation is 10 min.  5. Rheumatoid arthritis involving multiple sites with positive rheumatoid factor (HCC) - Continue to see Dr Jefm Bryant  - methotrexate 50 MG/2ML injection; Inject 50 mg/m2 into the vein once. 0.54mls once weekly  6. Screening PSA (prostate specific  antigen) - PSA  7. Essential hypertension, benign - Elevated today however cpntinue on meds as before add T4, free  8. 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 It was dialated via EGD. Had esophagitis, esomeprazole (NEXIUM) 40 MG capsule; Take 40 mg by mouth daily at 12 noon. - CBC with Differential/Platelet  General Counseling: Benancio verbalizes understanding of the findings of todays visit and agrees with plan of treatment. I have discussed any further diagnostic  evaluation that may be needed or ordered today. We also reviewed his medications today. he has been encouraged to call the office with any questions or concerns that should arise related to todays visit.  Orders Placed This Encounter  Procedures  . CBC with Differential/Platelet  . Lipid Panel With LDL/HDL Ratio  . TSH  . T4, free  . Comprehensive metabolic panel  . PSA  . Lyme Disease, IgM, Early Test w/ Rflx    Meds ordered this encounter  Medications  . DISCONTD: ketoconazole (NIZORAL) 2 % cream  . ketoconazole (NIZORAL) 2 % cream    Sig: Apply 1 application topically 2 (two) times daily.    Dispense:  15 g    Refill:  0    Time spent: 30 Minutes  This patient was seen by Orson Gear AGNP-C in Collaboration with Dr Lavera Guise as a part of collaborative care agreement   Lavera Guise, MD  Internal Medicine

## 2018-04-18 LAB — CBC WITH DIFFERENTIAL/PLATELET
BASOS: 0 %
Basophils Absolute: 0 10*3/uL (ref 0.0–0.2)
EOS (ABSOLUTE): 0.1 10*3/uL (ref 0.0–0.4)
EOS: 2 %
HEMOGLOBIN: 12.6 g/dL — AB (ref 13.0–17.7)
Hematocrit: 37.4 % — ABNORMAL LOW (ref 37.5–51.0)
IMMATURE GRANS (ABS): 0 10*3/uL (ref 0.0–0.1)
IMMATURE GRANULOCYTES: 0 %
Lymphocytes Absolute: 1.8 10*3/uL (ref 0.7–3.1)
Lymphs: 38 %
MCH: 30.5 pg (ref 26.6–33.0)
MCHC: 33.7 g/dL (ref 31.5–35.7)
MCV: 91 fL (ref 79–97)
MONOS ABS: 0.5 10*3/uL (ref 0.1–0.9)
Monocytes: 10 %
NEUTROS ABS: 2.5 10*3/uL (ref 1.4–7.0)
NEUTROS PCT: 50 %
Platelets: 249 10*3/uL (ref 150–450)
RBC: 4.13 x10E6/uL — ABNORMAL LOW (ref 4.14–5.80)
RDW: 15.5 % — ABNORMAL HIGH (ref 12.3–15.4)
WBC: 4.9 10*3/uL (ref 3.4–10.8)

## 2018-04-18 LAB — COMPREHENSIVE METABOLIC PANEL
A/G RATIO: 1.4 (ref 1.2–2.2)
ALBUMIN: 3.6 g/dL (ref 3.5–5.5)
ALT: 12 IU/L (ref 0–44)
AST: 21 IU/L (ref 0–40)
Alkaline Phosphatase: 83 IU/L (ref 39–117)
BILIRUBIN TOTAL: 0.2 mg/dL (ref 0.0–1.2)
BUN / CREAT RATIO: 12 (ref 9–20)
BUN: 12 mg/dL (ref 6–24)
CHLORIDE: 107 mmol/L — AB (ref 96–106)
CO2: 21 mmol/L (ref 20–29)
Calcium: 8.5 mg/dL — ABNORMAL LOW (ref 8.7–10.2)
Creatinine, Ser: 0.97 mg/dL (ref 0.76–1.27)
GFR, EST AFRICAN AMERICAN: 99 mL/min/{1.73_m2} (ref >59)
GFR, EST NON AFRICAN AMERICAN: 86 mL/min/{1.73_m2} (ref >59)
GLOBULIN, TOTAL: 2.6 g/dL (ref 1.5–4.5)
Glucose: 95 mg/dL (ref 65–99)
POTASSIUM: 4.1 mmol/L (ref 3.5–5.2)
Sodium: 145 mmol/L — ABNORMAL HIGH (ref 134–144)
Total Protein: 6.2 g/dL (ref 6.0–8.5)

## 2018-04-18 LAB — LIPID PANEL WITH LDL/HDL RATIO
Cholesterol, Total: 127 mg/dL (ref 100–199)
HDL: 23 mg/dL — AB (ref >39)
LDL CALC: 63 mg/dL (ref 0–99)
LDl/HDL Ratio: 2.7 ratio (ref 0.0–3.6)
TRIGLYCERIDES: 205 mg/dL — AB (ref 0–149)
VLDL Cholesterol Cal: 41 mg/dL — ABNORMAL HIGH (ref 5–40)

## 2018-04-18 LAB — T4, FREE: Free T4: 1.16 ng/dL (ref 0.82–1.77)

## 2018-04-18 LAB — PSA: Prostate Specific Ag, Serum: 0.4 ng/mL (ref 0.0–4.0)

## 2018-04-18 LAB — TSH: TSH: 0.452 u[IU]/mL (ref 0.450–4.500)

## 2018-04-22 ENCOUNTER — Encounter: Payer: Self-pay | Admitting: Adult Health

## 2018-04-22 DIAGNOSIS — S30861A Insect bite (nonvenomous) of abdominal wall, initial encounter: Secondary | ICD-10-CM | POA: Insufficient documentation

## 2018-04-22 DIAGNOSIS — Z0001 Encounter for general adult medical examination with abnormal findings: Secondary | ICD-10-CM | POA: Insufficient documentation

## 2018-04-22 DIAGNOSIS — W57XXXA Bitten or stung by nonvenomous insect and other nonvenomous arthropods, initial encounter: Secondary | ICD-10-CM

## 2018-04-22 DIAGNOSIS — B3789 Other sites of candidiasis: Secondary | ICD-10-CM | POA: Insufficient documentation

## 2018-04-28 ENCOUNTER — Telehealth: Payer: Self-pay

## 2018-04-28 NOTE — Telephone Encounter (Signed)
Left message and asked patient to call and confirm he got my message on scheduling appointment to review labs and BP/ [facility]

## 2018-05-02 ENCOUNTER — Ambulatory Visit (INDEPENDENT_AMBULATORY_CARE_PROVIDER_SITE_OTHER): Payer: Medicare Other | Admitting: Adult Health

## 2018-05-02 ENCOUNTER — Encounter: Payer: Self-pay | Admitting: Adult Health

## 2018-05-02 VITALS — BP 148/88 | HR 73 | Resp 16 | Ht 68.0 in | Wt 145.6 lb

## 2018-05-02 DIAGNOSIS — R5382 Chronic fatigue, unspecified: Secondary | ICD-10-CM | POA: Diagnosis not present

## 2018-05-02 DIAGNOSIS — I10 Essential (primary) hypertension: Secondary | ICD-10-CM | POA: Diagnosis not present

## 2018-05-02 DIAGNOSIS — W57XXXS Bitten or stung by nonvenomous insect and other nonvenomous arthropods, sequela: Secondary | ICD-10-CM

## 2018-05-02 DIAGNOSIS — R5381 Other malaise: Secondary | ICD-10-CM

## 2018-05-02 DIAGNOSIS — K219 Gastro-esophageal reflux disease without esophagitis: Secondary | ICD-10-CM | POA: Diagnosis not present

## 2018-05-02 MED ORDER — OMEPRAZOLE 40 MG PO CPDR: 40.0000 mg | DELAYED_RELEASE_CAPSULE | Freq: Two times a day (BID) | ORAL | 3 refills | Status: DC

## 2018-05-02 NOTE — Patient Instructions (Signed)

## 2018-05-02 NOTE — Progress Notes (Signed)
[facility] [place], [place] [address]  Internal MEDICINE  Office Visit Note  Patient Name: Jose Cuevas  [id number]  [id number]  Date of Service: 05/02/2018  Chief Complaint  Patient presents with  . Hypertension    HPI Pt here reports he continues to feel bad. He has not energy.  When his labs were sent at last visit it appears [facility] never resulted his Lyme titer.  Pt continues to find ticks intermittently.  He reports excessive fatigue and joint aching.He is followed by GI for what he says is "Dead stomach muscles."  Will reorder tick labs.    Current Medication: Outpatient Encounter Medications as of 05/02/2018  Medication Sig  . Aspirin-Caffeine 1000-65 MG PACK Take by mouth.  . folic acid (FOLVITE) 1 MG tablet Take 1 mg by mouth daily.  Marland Kitchen HYDROcodone-acetaminophen (NORCO) 10-325 MG tablet Take 1 tablet by mouth every 6 (six) hours as needed.  Marland Kitchen ketoconazole (NIZORAL) 2 % cream Apply 1 application topically 2 (two) times daily.  Marland Kitchen loratadine (CLARITIN) 10 MG tablet Take 10 mg by mouth daily.  Marland Kitchen losartan-hydrochlorothiazide (HYZAAR) 100-25 MG tablet Take 1 tablet by mouth daily.  . methadone (DOLOPHINE) 10 MG tablet Take 10 mg by mouth every 8 (eight) hours.  . methotrexate 50 MG/2ML injection Inject 50 mg/m2 into the vein once. 0.59mls once weekly  . omeprazole (PRILOSEC) 20 MG capsule Take 20 mg by mouth daily.  . vitamin B-12 (CYANOCOBALAMIN) 1000 MCG tablet Take 1,000 mcg by mouth daily.  . [DISCONTINUED] aspirin EC 81 MG tablet Take 81 mg by mouth daily.  . [DISCONTINUED] esomeprazole (NEXIUM) 40 MG capsule Take 40 mg by mouth daily at 12 noon.  . [DISCONTINUED] Cod Liver Oil OIL Take by mouth.  . [DISCONTINUED] etanercept (ENBREL) 50 MG/ML injection Inject 50 mg into the skin once a week.  . [DISCONTINUED] meloxicam (MOBIC) 7.5 MG tablet Take 7.5 mg by mouth daily.  . [DISCONTINUED] methotrexate (RHEUMATREX) 2.5 MG tablet Take 10 mg by mouth once  a week.  . [DISCONTINUED] rosuvastatin (CRESTOR) 20 MG tablet Take 20 mg by mouth daily.  . [DISCONTINUED] tamsulosin (FLOMAX) 0.4 MG CAPS capsule Take 0.4 mg by mouth.  . [DISCONTINUED] testosterone (ANDROGEL) 50 MG/5GM (1%) GEL Place 5 g onto the skin daily.   No facility-administered encounter medications on file as of 05/02/2018.     Surgical History: Past Surgical History:  Procedure Laterality Date  . BACK SURGERY    . COLONOSCOPY WITH PROPOFOL N/A 11/11/2015   Procedure: COLONOSCOPY WITH PROPOFOL;  Surgeon: [name], MD;  Location[facility] ENDOSCOPY;  Service: Endoscopy;  Laterality: N/A;  . COLONOSCOPY WITH PROPOFOL N/A 12/02/2015   Procedure: COLONOSCOPY WITH PROPOFOL;  Surgeon: [name], MD;  Location: [facility] ENDOSCOPY;  Service: Endoscopy;  Laterality: N/A;  . ESOPHAGOGASTRODUODENOSCOPY (EGD) WITH PROPOFOL N/A 11/11/2015   Procedure: ESOPHAGOGASTRODUODENOSCOPY (EGD) WITH PROPOFOL;  Surgeon: [name], MD;  Location: [facility] ENDOSCOPY;  Service: Endoscopy;  Laterality: N/A;  . ESOPHAGOGASTRODUODENOSCOPY (EGD) WITH PROPOFOL N/A 12/02/2015   Procedure: ESOPHAGOGASTRODUODENOSCOPY (EGD) WITH PROPOFOL;  Surgeon: [name], MD;  Location: [facility] ENDOSCOPY;  Service: Endoscopy;  Laterality: N/A;  . ESOPHAGOGASTRODUODENOSCOPY (EGD) WITH PROPOFOL N/A 01/20/2016   Procedure: ESOPHAGOGASTRODUODENOSCOPY (EGD) WITH PROPOFOL;  Surgeon: [name], MD;  Location: [facility] ENDOSCOPY;  Service: Endoscopy;  Laterality: N/A;  . KNEE RECONSTRUCTION Left     Medical History: Past Medical History:  Diagnosis Date  . Collagen vascular disease ([place])   .  Hyperlipidemia   . Hypertension   . Low kidney function   . Peripheral blood vessel disorder ([place])   . Rheumatoid arthritis with rheumatoid factor (HCC)   . Sleep apnea     Family History: Family History  Problem Relation Age of Onset  . Diabetes Mother   . Diabetes Father     Social History   Socioeconomic  History  . Marital status: Married    Spouse name: Not on file  . Number of children: Not on file  . Years of education: Not on file  . Highest education level: Not on file  Occupational History  . Not on file  Social Needs  . Financial resource strain: Not on file  . Food insecurity:    Worry: Not on file    Inability: Not on file  . Transportation needs:    Medical: Not on file    Non-medical: Not on file  Tobacco Use  . Smoking status: Current Some Day Smoker    Types: Cigars  . Smokeless tobacco: Former Network engineer and Sexual Activity  . Alcohol use: No  . Drug use: No  . Sexual activity: Not on file  Lifestyle  . Physical activity:    Days per week: Not on file    Minutes per session: Not on file  . Stress: Not on file  Relationships  . Social connections:    Talks on phone: Not on file    Gets together: Not on file    Attends religious service: Not on file    Active member of club or organization: Not on file    Attends meetings of clubs or organizations: Not on file    Relationship status: Not on file  . Intimate partner violence:    Fear of current or ex partner: Not on file    Emotionally abused: Not on file    Physically abused: Not on file    Forced sexual activity: Not on file  Other Topics Concern  . Not on file  Social History Narrative  . Not on file      Review of Systems  Constitutional: Negative.  Negative for chills, fatigue and unexpected weight change.  HENT: Negative.  Negative for congestion, rhinorrhea, sneezing and sore throat.   Eyes: Negative for redness.  Respiratory: Negative.  Negative for cough, chest tightness and shortness of breath.   Cardiovascular: Negative.  Negative for chest pain and palpitations.  Gastrointestinal: Negative.  Negative for abdominal pain, constipation, diarrhea, nausea and vomiting.  Endocrine: Negative.   Genitourinary: Negative.  Negative for dysuria and frequency.  Musculoskeletal: Negative.   Negative for arthralgias, back pain, joint swelling and neck pain.  Skin: Negative.  Negative for rash.  Allergic/Immunologic: Negative.   Neurological: Negative.  Negative for tremors and numbness.  Hematological: Negative for adenopathy. Does not bruise/bleed easily.  Psychiatric/Behavioral: Negative.  Negative for behavioral problems, sleep disturbance and suicidal ideas. The patient is not nervous/anxious.     Vital Signs: BP (!) 148/88   Pulse 73   Resp 16   Ht 5\' 8"  (1.727 m)   Wt 145 lb 9.6 oz (66 kg)   SpO2 97%   BMI 22.14 kg/m    Physical Exam  Constitutional: He is oriented to person, place, and time. He appears well-developed and well-nourished. No distress.  HENT:  Head: Normocephalic and atraumatic.  Mouth/Throat: Oropharynx is clear and moist. No oropharyngeal exudate.  Eyes: Pupils are equal, round, and reactive to light. EOM are  normal.  Neck: Normal range of motion. Neck supple. No JVD present. No tracheal deviation present. No thyromegaly present.  Cardiovascular: Normal rate, regular rhythm and normal heart sounds. Exam reveals no gallop and no friction rub.  No murmur heard. Pulmonary/Chest: Effort normal and breath sounds normal. No respiratory distress. He has no wheezes. He has no rales. He exhibits no tenderness.  Abdominal: Soft. There is no tenderness. There is no guarding.  Musculoskeletal: Normal range of motion.  Lymphadenopathy:    He has no cervical adenopathy.  Neurological: He is alert and oriented to person, place, and time. No cranial nerve deficit.  Skin: Skin is warm and dry. He is not diaphoretic.  Psychiatric: He has a normal mood and affect. His behavior is normal. Judgment and thought content normal.  Nursing note and vitals reviewed.   Assessment/Plan: 1. Tick bite, sequela Continues to report fatigue, achy joints and muscles.  Lyme titer was not drawn by [facility] at last draw.  Will reorder. Discussed putting patient on Doxycycline,  but given GI issues he would like to wait until labs come back.  - Lyme Disease, IgM, Early Test w/ Rflx - [place] spotted fvr ab, IgG-blood  2. Chronic fatigue and malaise - B12 and Folate Panel - Vitamin D (25 hydroxy) - Fe+TIBC+Fer  3. Essential hypertension, benign Pt reports his blood pressure is only high in the office.  When he checks it at home it is normal.  Will discuss small dose of norvasc at next visit.   4. Gastroesophageal reflux disease without esophagitis Refill medications. - omeprazole (PRILOSEC) 40 MG capsule; Take 1 capsule (40 mg total) by mouth 2 (two) times daily.  Dispense: 60 capsule; Refill: 3   General Counseling: Lavance verbalizes understanding of the findings of todays visit and agrees with plan of treatment. I have discussed any further diagnostic evaluation that may be needed or ordered today. We also reviewed his medications today. he has been encouraged to call the office with any questions or concerns that should arise related to todays visit.    No orders of the defined types were placed in this encounter.   No orders of the defined types were placed in this encounter.   Time spent: 25 Minutes   This patient was seen by Orson Gear AGNP-C in Collaboration with Dr Lavera Guise as a part of collaborative care agreement    Dr Lavera Guise Internal medicine

## 2018-05-03 LAB — IRON,TIBC AND FERRITIN PANEL
Ferritin: 64 ng/mL (ref 30–400)
IRON SATURATION: 26 % (ref 15–55)
IRON: 47 ug/dL (ref 38–169)
TIBC: 179 ug/dL — AB (ref 250–450)
UIBC: 132 ug/dL (ref 111–343)

## 2018-05-03 LAB — VITAMIN D 25 HYDROXY (VIT D DEFICIENCY, FRACTURES): VIT D 25 HYDROXY: 21.8 ng/mL — AB (ref 30.0–100.0)

## 2018-05-03 LAB — B12 AND FOLATE PANEL
Folate: >20 ng/mL (ref >3.0)
Vitamin B-12: 627 pg/mL (ref 232–1245)

## 2018-05-06 LAB — LYME, IGM, EARLY TEST/REFLEX: LYME DISEASE AB, QUANT, IGM: <0.8 index (ref 0.00–0.79)

## 2018-05-06 LAB — ROCKY MTN SPOTTED FVR AB, IGG-BLOOD: RMSF IgG: NEGATIVE

## 2018-05-07 ENCOUNTER — Encounter: Payer: Self-pay | Admitting: Adult Health

## 2018-06-04 ENCOUNTER — Ambulatory Visit (INDEPENDENT_AMBULATORY_CARE_PROVIDER_SITE_OTHER): Payer: Medicare Other | Admitting: Adult Health

## 2018-06-04 ENCOUNTER — Encounter: Payer: Self-pay | Admitting: Adult Health

## 2018-06-04 VITALS — BP 126/82 | HR 73 | Resp 16 | Ht 68.0 in | Wt 143.0 lb

## 2018-06-04 DIAGNOSIS — F17200 Nicotine dependence, unspecified, uncomplicated: Secondary | ICD-10-CM | POA: Diagnosis not present

## 2018-06-04 DIAGNOSIS — K219 Gastro-esophageal reflux disease without esophagitis: Secondary | ICD-10-CM | POA: Diagnosis not present

## 2018-06-04 DIAGNOSIS — E782 Mixed hyperlipidemia: Secondary | ICD-10-CM

## 2018-06-04 DIAGNOSIS — R5382 Chronic fatigue, unspecified: Secondary | ICD-10-CM | POA: Diagnosis not present

## 2018-06-04 DIAGNOSIS — I10 Essential (primary) hypertension: Secondary | ICD-10-CM | POA: Diagnosis not present

## 2018-06-04 DIAGNOSIS — Z23 Encounter for immunization: Secondary | ICD-10-CM

## 2018-06-04 DIAGNOSIS — R5381 Other malaise: Secondary | ICD-10-CM

## 2018-06-04 DIAGNOSIS — B3789 Other sites of candidiasis: Secondary | ICD-10-CM

## 2018-06-04 MED ORDER — VITAMIN D (ERGOCALCIFEROL) 1.25 MG (50000 UNIT) PO CAPS: 50000.0000 [IU] | ORAL_CAPSULE | ORAL | 0 refills | Status: DC

## 2018-06-04 NOTE — Progress Notes (Signed)
[facility] [place], [place] [address]  Internal MEDICINE  Office Visit Note  Patient Name: Jose Cuevas  [id number]  [id number]  Date of Service: 06/16/2018  Chief Complaint  Patient presents with  . Hypertension  . Hyperlipidemia  . Osteoarthritis    HPI Pt here for follow up on HTN and HLD and chronic fatigue and weight loss.  Pt reports his blood pressure has been controlled.  His cholesterol is slightly elevated, however the patients diet is questionable.  He has been told his intestines are "dead" and he vomits nearly every time he eats something.  He was scoped by GI in 2017 however he feels like the symptoms are progressing.  Taking omeprazole helps at times, but he still reports weight loss, due to difficulty eating.  He has extensive history of strictures, and reports esophageal dilatation and pyloric sphincter dilatation in 2017.      Current Medication: Outpatient Encounter Medications as of 06/04/2018  Medication Sig  . Aspirin-Caffeine 1000-65 MG PACK Take by mouth.  . folic acid (FOLVITE) 1 MG tablet Take 1 mg by mouth daily.  Marland Kitchen HYDROcodone-acetaminophen (NORCO) 10-325 MG tablet Take 1 tablet by mouth every 6 (six) hours as needed.  Marland Kitchen ketoconazole (NIZORAL) 2 % cream Apply 1 application topically 2 (two) times daily.  Marland Kitchen loratadine (CLARITIN) 10 MG tablet Take 10 mg by mouth daily.  Marland Kitchen losartan-hydrochlorothiazide (HYZAAR) 100-25 MG tablet Take 1 tablet by mouth daily.  . methadone (DOLOPHINE) 10 MG tablet Take 10 mg by mouth every 8 (eight) hours.  . methotrexate 50 MG/2ML injection Inject 50 mg/m2 into the vein once. 0.57mls once weekly  . omeprazole (PRILOSEC) 20 MG capsule Take 20 mg by mouth daily.  Marland Kitchen omeprazole (PRILOSEC) 40 MG capsule Take 1 capsule (40 mg total) by mouth 2 (two) times daily.  . vitamin B-12 (CYANOCOBALAMIN) 1000 MCG tablet Take 1,000 mcg by mouth daily.  . Vitamin D, Ergocalciferol, (DRISDOL) 50000 units CAPS capsule Take 1  capsule (50,000 Units total) by mouth every 7 (seven) days.   No facility-administered encounter medications on file as of 06/04/2018.     Surgical History: Past Surgical History:  Procedure Laterality Date  . BACK SURGERY    . COLONOSCOPY WITH PROPOFOL N/A 11/11/2015   Procedure: COLONOSCOPY WITH PROPOFOL;  Surgeon: [name], MD;  Location: [facility] ENDOSCOPY;  Service: Endoscopy;  Laterality: N/A;  . COLONOSCOPY WITH PROPOFOL N/A 12/02/2015   Procedure: COLONOSCOPY WITH PROPOFOL;  Surgeon: [name], MD;  Location: [facility] ENDOSCOPY;  Service: Endoscopy;  Laterality: N/A;  . ESOPHAGOGASTRODUODENOSCOPY (EGD) WITH PROPOFOL N/A 11/11/2015   Procedure: ESOPHAGOGASTRODUODENOSCOPY (EGD) WITH PROPOFOL;  Surgeon: [name], MD;  Location: [facility] ENDOSCOPY;  Service: Endoscopy;  Laterality: N/A;  . ESOPHAGOGASTRODUODENOSCOPY (EGD) WITH PROPOFOL N/A 12/02/2015   Procedure: ESOPHAGOGASTRODUODENOSCOPY (EGD) WITH PROPOFOL;  Surgeon: [name], MD;  Location: [facility] ENDOSCOPY;  Service: Endoscopy;  Laterality: N/A;  . ESOPHAGOGASTRODUODENOSCOPY (EGD) WITH PROPOFOL N/A 01/20/2016   Procedure: ESOPHAGOGASTRODUODENOSCOPY (EGD) WITH PROPOFOL;  Surgeon: [name], MD;  Location: [facility] ENDOSCOPY;  Service: Endoscopy;  Laterality: N/A;  . KNEE RECONSTRUCTION Left     Medical History: Past Medical History:  Diagnosis Date  . Collagen vascular disease ([place])   . Hyperlipidemia   . Hypertension   . Low kidney function   . Peripheral blood vessel disorder ([place])   . Rheumatoid arthritis with rheumatoid factor (HCC)   . Sleep apnea     Family History:  Family History  Problem Relation Age of Onset  . Diabetes Mother   . Diabetes Father     Social History   Socioeconomic History  . Marital status: Married    Spouse name: Not on file  . Number of children: Not on file  . Years of education: Not on file  . Highest education level: Not on file  Occupational History  .  Not on file  Social Needs  . Financial resource strain: Not on file  . Food insecurity:    Worry: Not on file    Inability: Not on file  . Transportation needs:    Medical: Not on file    Non-medical: Not on file  Tobacco Use  . Smoking status: Current Some Day Smoker    Types: Cigars  . Smokeless tobacco: Former Network engineer and Sexual Activity  . Alcohol use: No  . Drug use: No  . Sexual activity: Not on file  Lifestyle  . Physical activity:    Days per week: Not on file    Minutes per session: Not on file  . Stress: Not on file  Relationships  . Social connections:    Talks on phone: Not on file    Gets together: Not on file    Attends religious service: Not on file    Active member of club or organization: Not on file    Attends meetings of clubs or organizations: Not on file    Relationship status: Not on file  . Intimate partner violence:    Fear of current or ex partner: Not on file    Emotionally abused: Not on file    Physically abused: Not on file    Forced sexual activity: Not on file  Other Topics Concern  . Not on file  Social History Narrative  . Not on file    Review of Systems  Constitutional: Negative.  Negative for chills, fatigue and unexpected weight change.  HENT: Negative.  Negative for congestion, rhinorrhea, sneezing and sore throat.   Eyes: Negative for redness.  Respiratory: Negative.  Negative for cough, chest tightness and shortness of breath.   Cardiovascular: Negative.  Negative for chest pain and palpitations.  Gastrointestinal: Negative.  Negative for abdominal pain, constipation, diarrhea, nausea and vomiting.  Endocrine: Negative.   Genitourinary: Negative.  Negative for dysuria and frequency.  Musculoskeletal: Negative.  Negative for arthralgias, back pain, joint swelling and neck pain.  Skin: Negative.  Negative for rash.  Allergic/Immunologic: Negative.   Neurological: Negative.  Negative for tremors and numbness.   Hematological: Negative for adenopathy. Does not bruise/bleed easily.  Psychiatric/Behavioral: Negative.  Negative for behavioral problems, sleep disturbance and suicidal ideas. The patient is not nervous/anxious.     Vital Signs: BP 126/82   Pulse 73   Resp 16   Ht 5\' 8"  (1.727 m)   Wt 143 lb (64.9 kg)   SpO2 97%   BMI 21.74 kg/m    Physical Exam  Constitutional: He is oriented to person, place, and time. He appears well-developed and well-nourished. No distress.  HENT:  Head: Normocephalic and atraumatic.  Mouth/Throat: Oropharynx is clear and moist. No oropharyngeal exudate.  Eyes: Pupils are equal, round, and reactive to light. EOM are normal.  Neck: Normal range of motion. Neck supple. No JVD present. No tracheal deviation present. No thyromegaly present.  Cardiovascular: Normal rate, regular rhythm and normal heart sounds. Exam reveals no gallop and no friction rub.  No murmur heard. Pulmonary/Chest: Effort normal  and breath sounds normal. No respiratory distress. He has no wheezes. He has no rales. He exhibits no tenderness.  Abdominal: Soft. There is no tenderness. There is no guarding.  Musculoskeletal: Normal range of motion.  Lymphadenopathy:    He has no cervical adenopathy.  Neurological: He is alert and oriented to person, place, and time. No cranial nerve deficit.  Skin: Skin is warm and dry. He is not diaphoretic.  Psychiatric: He has a normal mood and affect. His behavior is normal. Judgment and thought content normal.  Nursing note and vitals reviewed.  Assessment/Plan: 1. Essential hypertension, benign Stable at this time.  Continue current medication use.   2. Gastroesophageal reflux disease without esophagitis Continue using omeprazole.  Has history of strictures, and was dilated in 2017.  -amb referral to GI.   3. Chronic fatigue and malaise Take Vit D Supplement as prescribed.  Will follow up in 5 weeks to assess energy level. - Vitamin D,  Ergocalciferol, (DRISDOL) 50000 units CAPS capsule; Take 1 capsule (50,000 Units total) by mouth every 7 (seven) days.  Dispense: 5 capsule; Refill: 0  4. Tobacco dependence Smoking cessation counseling: 1. Pt acknowledges the risks of long term smoking, she will try to quite smoking. 2. Options for different medications including nicotine products, chewing gum, patch etc, Wellbutrin and Chantix is discussed 3. Goal and date of compete cessation is discussed 4. Total time spent in smoking cessation is 15 min.  5. Mixed hyperlipidemia Pt aware of cholesterol elevation.  Will follow   6. Candida rash of groin  Resolved  7. Flu vaccine need - Flu Vaccine MDCK QUAD PF  General Counseling: Derrian verbalizes understanding of the findings of todays visit and agrees with plan of treatment. I have discussed any further diagnostic evaluation that may be needed or ordered today. We also reviewed his medications today. he has been encouraged to call the office with any questions or concerns that should arise related to todays visit.  Orders Placed This Encounter  Procedures  . Flu Vaccine MDCK QUAD PF  . Ambulatory referral to Gastroenterology    Meds ordered this encounter  Medications  . Vitamin D, Ergocalciferol, (DRISDOL) 50000 units CAPS capsule    Sig: Take 1 capsule (50,000 Units total) by mouth every 7 (seven) days.    Dispense:  5 capsule    Refill:  0    Time spent: 25 Minutes  This patient was seen by Orson Gear AGNP-C in Collaboration with Dr Lavera Guise as a part of collaborative care agreement    Dr Lavera Guise Internal medicine

## 2018-06-04 NOTE — Patient Instructions (Signed)
Vitamin D Deficiency Vitamin D deficiency is when your body does not have enough vitamin D. Vitamin D is important because:  It helps your body use other minerals that your body needs.  It helps keep your bones strong and healthy.  It may help to prevent some diseases.  It helps your heart and other muscles work well.  You can get vitamin D by:  Eating foods with vitamin D in them.  Drinking or eating milk or other foods that have had vitamin D added to them.  Taking a vitamin D supplement.  Being in the sun.  Not getting enough vitamin D can make your bones become soft. It can also cause other health problems. Follow these instructions at home:  Take medicines and supplements only as told by your doctor.  Eat foods that have vitamin D. These include: ? Dairy products, cereals, or juices with added vitamin D. Check the label for vitamin D. ? Fatty fish like salmon or trout. ? Eggs. ? Oysters.  Do not use tanning beds.  Stay at a healthy weight. Lose weight, if needed.  Keep all follow-up visits as told by your doctor. This is important. Contact a doctor if:  Your symptoms do not go away.  You feel sick to your stomach (nauseous).  Youthrow up (vomit).  You poop less often than usual or you have trouble pooping (constipation). This information is not intended to replace advice given to you by your health care provider. Make sure you discuss any questions you have with your health care provider. Document Released: 09/06/2011 Document Revised: 02/23/2016 Document Reviewed: 02/02/2015 Elsevier Interactive Patient Education  2018 Elsevier Inc.  

## 2018-06-10 ENCOUNTER — Encounter: Payer: Self-pay | Admitting: *Deleted

## 2018-07-09 ENCOUNTER — Ambulatory Visit: Payer: Self-pay | Admitting: Adult Health

## 2018-08-29 ENCOUNTER — Other Ambulatory Visit: Payer: Self-pay | Admitting: Adult Health

## 2018-08-29 DIAGNOSIS — K219 Gastro-esophageal reflux disease without esophagitis: Secondary | ICD-10-CM

## 2018-12-26 ENCOUNTER — Other Ambulatory Visit: Payer: Self-pay | Admitting: Adult Health

## 2018-12-26 DIAGNOSIS — K219 Gastro-esophageal reflux disease without esophagitis: Secondary | ICD-10-CM

## 2019-04-20 ENCOUNTER — Other Ambulatory Visit: Payer: Self-pay

## 2019-04-20 ENCOUNTER — Ambulatory Visit (INDEPENDENT_AMBULATORY_CARE_PROVIDER_SITE_OTHER): Payer: Medicare Other | Admitting: Adult Health

## 2019-04-20 ENCOUNTER — Encounter: Payer: Self-pay | Admitting: Adult Health

## 2019-04-20 VITALS — BP 158/94 | HR 73 | Resp 16 | Ht 68.0 in | Wt 132.0 lb

## 2019-04-20 DIAGNOSIS — R6881 Early satiety: Secondary | ICD-10-CM

## 2019-04-20 DIAGNOSIS — K3184 Gastroparesis: Secondary | ICD-10-CM

## 2019-04-20 DIAGNOSIS — R300 Dysuria: Secondary | ICD-10-CM

## 2019-04-20 DIAGNOSIS — Z0001 Encounter for general adult medical examination with abnormal findings: Secondary | ICD-10-CM

## 2019-04-20 DIAGNOSIS — I10 Essential (primary) hypertension: Secondary | ICD-10-CM

## 2019-04-20 DIAGNOSIS — K219 Gastro-esophageal reflux disease without esophagitis: Secondary | ICD-10-CM

## 2019-04-20 DIAGNOSIS — R634 Abnormal weight loss: Secondary | ICD-10-CM | POA: Diagnosis not present

## 2019-04-20 DIAGNOSIS — R140 Abdominal distension (gaseous): Secondary | ICD-10-CM | POA: Diagnosis not present

## 2019-04-20 MED ORDER — ESOMEPRAZOLE MAGNESIUM 40 MG PO CPDR: 40.0000 mg | DELAYED_RELEASE_CAPSULE | Freq: Two times a day (BID) | ORAL | 2 refills | Status: DC

## 2019-04-20 NOTE — Progress Notes (Signed)
[facility] [place], [place] [address]  Internal MEDICINE  Office Visit Note  Patient Name: Jose Cuevas  [id number]  [id number]  Date of Service: 04/20/2019  Chief Complaint  Patient presents with  . Medical Management of Chronic Issues  . Annual Exam    medicare annual well visit   . Hyperlipidemia  . Hypertension    bp is elevated   . Nephrolithiasis     HPI Pt is here for routine health maintenance examination. Pt is a 16 you white male.  Abdominal pain with eating. Hx of gastroparesis.  Has lost weight.   Dizzy when walking/ light headed, BP is elevated today 158/94. Also reports he passed a kidney stone recently. Chronic lumbar pain, on methadone for 4 years.    Current Medication: Outpatient Encounter Medications as of 04/20/2019  Medication Sig  . Adalimumab (HUMIRA PEN) 40 MG/0.4ML PNKT Inject into the skin. Every other week  . Aspirin-Caffeine (BC FAST PAIN RELIEF ARTHRITIS PO) Take by mouth.  . esomeprazole (NEXIUM) 40 MG capsule Take 40 mg by mouth 2 (two) times daily before a meal.  . folic acid (FOLVITE) 1 MG tablet Take 1 mg by mouth daily.  . methadone (DOLOPHINE) 10 MG tablet Take 10 mg by mouth every 8 (eight) hours.  . methotrexate 50 MG/2ML injection Inject 50 mg/m2 into the vein once. 0.66mls once weekly  . vitamin B-12 (CYANOCOBALAMIN) 1000 MCG tablet Take 1,000 mcg by mouth daily.  Marland Kitchen ketoconazole (NIZORAL) 2 % cream Apply 1 application topically 2 (two) times daily.  . [DISCONTINUED] Aspirin-Caffeine 1000-65 MG PACK Take by mouth.  . [DISCONTINUED] HYDROcodone-acetaminophen (NORCO) 10-325 MG tablet Take 1 tablet by mouth every 6 (six) hours as needed.  . [DISCONTINUED] loratadine (CLARITIN) 10 MG tablet Take 10 mg by mouth daily.  . [DISCONTINUED] losartan-hydrochlorothiazide (HYZAAR) 100-25 MG tablet Take 1 tablet by mouth daily.  . [DISCONTINUED] omeprazole (PRILOSEC) 20 MG capsule Take 20 mg by mouth daily.  .  [DISCONTINUED] omeprazole (PRILOSEC) 40 MG capsule TAKE 1 CAPSULE BY MOUTH TWICE A DAY (Patient not taking: Reported on 04/20/2019)  . [DISCONTINUED] Vitamin D, Ergocalciferol, (DRISDOL) 50000 units CAPS capsule Take 1 capsule (50,000 Units total) by mouth every 7 (seven) days. (Patient not taking: Reported on 04/20/2019)   No facility-administered encounter medications on file as of 04/20/2019.     Surgical History: Past Surgical History:  Procedure Laterality Date  . BACK SURGERY    . COLONOSCOPY WITH PROPOFOL N/A 11/11/2015   Procedure: COLONOSCOPY WITH PROPOFOL;  Surgeon: [name], MD;  Location: [facility] ENDOSCOPY;  Service: Endoscopy;  Laterality: N/A;  . COLONOSCOPY WITH PROPOFOL N/A 12/02/2015   Procedure: COLONOSCOPY WITH PROPOFOL;  Surgeon: [name], MD;  Location: [facility] ENDOSCOPY;  Service: Endoscopy;  Laterality: N/A;  . ESOPHAGOGASTRODUODENOSCOPY (EGD) WITH PROPOFOL N/A 11/11/2015   Procedure: ESOPHAGOGASTRODUODENOSCOPY (EGD) WITH PROPOFOL;  Surgeon: [name], MD;  Location: [facility] ENDOSCOPY;  Service: Endoscopy;  Laterality: N/A;  . ESOPHAGOGASTRODUODENOSCOPY (EGD) WITH PROPOFOL N/A 12/02/2015   Procedure: ESOPHAGOGASTRODUODENOSCOPY (EGD) WITH PROPOFOL;  Surgeon: [name], MD;  Location: [facility] ENDOSCOPY;  Service: Endoscopy;  Laterality: N/A;  . ESOPHAGOGASTRODUODENOSCOPY (EGD) WITH PROPOFOL N/A 01/20/2016   Procedure: ESOPHAGOGASTRODUODENOSCOPY (EGD) WITH PROPOFOL;  Surgeon: [name], MD;  Location: [facility] ENDOSCOPY;  Service: Endoscopy;  Laterality: N/A;  . KNEE RECONSTRUCTION Left     Medical History: Past Medical History:  Diagnosis Date  . Collagen vascular disease ([place])   . Hyperlipidemia   .  Hypertension   . Low kidney function   . Peripheral blood vessel disorder ([place])   . Rheumatoid arthritis with rheumatoid factor (HCC)   . Sleep apnea     Family History: Family History  Problem Relation Age of Onset  . Diabetes Mother    . Diabetes Father       Review of Systems  Constitutional: Negative.  Negative for chills, fatigue and unexpected weight change.  HENT: Negative.  Negative for congestion, rhinorrhea, sneezing and sore throat.   Eyes: Negative for redness.  Respiratory: Negative.  Negative for cough, chest tightness and shortness of breath.   Cardiovascular: Negative.  Negative for chest pain and palpitations.  Gastrointestinal: Negative.  Negative for abdominal pain, constipation, diarrhea, nausea and vomiting.  Endocrine: Negative.   Genitourinary: Negative.  Negative for dysuria and frequency.  Musculoskeletal: Negative.  Negative for arthralgias, back pain, joint swelling and neck pain.  Skin: Negative.  Negative for rash.  Allergic/Immunologic: Negative.   Neurological: Negative.  Negative for tremors and numbness.  Hematological: Negative for adenopathy. Does not bruise/bleed easily.  Psychiatric/Behavioral: Negative.  Negative for behavioral problems, sleep disturbance and suicidal ideas. The patient is not nervous/anxious.      Vital Signs: BP (!) 158/94   Pulse 73   Resp 16   Ht 5\' 8"  (1.727 m)   Wt 132 lb (59.9 kg)   SpO2 97%   BMI 20.07 kg/m    Physical Exam Vitals signs and nursing note reviewed.  Constitutional:      General: He is not in acute distress.    Appearance: He is well-developed. He is not diaphoretic.  HENT:     Head: Normocephalic and atraumatic.     Mouth/Throat:     Pharynx: No oropharyngeal exudate.  Eyes:     Pupils: Pupils are equal, round, and reactive to light.  Neck:     Musculoskeletal: Normal range of motion and neck supple.     Thyroid: No thyromegaly.     Vascular: No JVD.     Trachea: No tracheal deviation.  Cardiovascular:     Rate and Rhythm: Normal rate and regular rhythm.     Heart sounds: Normal heart sounds. No murmur. No friction rub. No gallop.   Pulmonary:     Effort: Pulmonary effort is normal. No respiratory distress.      Breath sounds: Normal breath sounds. No wheezing or rales.  Chest:     Chest wall: No tenderness.  Abdominal:     Palpations: Abdomen is soft.     Tenderness: There is no abdominal tenderness. There is no guarding.  Musculoskeletal: Normal range of motion.  Lymphadenopathy:     Cervical: No cervical adenopathy.  Skin:    General: Skin is warm and dry.  Neurological:     Mental Status: He is alert and oriented to person, place, and time.     Cranial Nerves: No cranial nerve deficit.  Psychiatric:        Behavior: Behavior normal.        Thought Content: Thought content normal.        Judgment: Judgment normal.      LABS: No results found for this or any previous visit (from the past 2160 hour(s)).  Assessment/Plan: 1. Encounter for general adult medical examination with abnormal findings Labs given to patient. Up to date on PHM.   2. Early satiety Will get upper GI and refer to gastroenterology.  - DG UGI W SINGLE CM (SOL OR THIN  BA); Future - Ambulatory referral to Gastroenterology  3. Abdominal bloating GI referral  4. Weight loss Due ot not being able to eat, he has lost some weight.  Encouraged patient to eat high calorie foods, with protein to combat this.  Follow up with GI  5. Gastroparesis Hx of Gastroparesis.    6. Essential hypertension, benign Slightly elevated today. Continue to monitor at future visits. encouraged patient to take Bp at home.    7. Gastroesophageal reflux disease without esophagitis - esomeprazole (NEXIUM) 40 MG capsule; Take 1 capsule (40 mg total) by mouth 2 (two) times daily before a meal.  Dispense: 30 capsule; Refill: 2  8. Dysuria - UA/M w/rflx Culture, Routine - Microscopic Examination  General Counseling: Hulbert verbalizes understanding of the findings of todays visit and agrees with plan of treatment. I have discussed any further diagnostic evaluation that may be needed or ordered today. We also reviewed his medications today. he  has been encouraged to call the office with any questions or concerns that should arise related to todays visit.   Orders Placed This Encounter  Procedures  . UA/M w/rflx Culture, Routine    No orders of the defined types were placed in this encounter.   Time spent: 35 Minutes   This patient was seen by Orson Gear AGNP-C in Collaboration with Dr Lavera Guise as a part of collaborative care agreement    Kendell Bane AGNP-C Internal Medicine

## 2019-04-21 ENCOUNTER — Other Ambulatory Visit
Admission: RE | Admit: 2019-04-21 | Discharge: 2019-04-21 | Disposition: A | Discharge status: Home / Self Care | Payer: Medicare Other | Source: Ambulatory Visit | Attending: Adult Health | Admitting: Adult Health

## 2019-04-21 DIAGNOSIS — K3184 Gastroparesis: Secondary | ICD-10-CM | POA: Insufficient documentation

## 2019-04-21 DIAGNOSIS — Z0001 Encounter for general adult medical examination with abnormal findings: Secondary | ICD-10-CM | POA: Diagnosis present

## 2019-04-21 DIAGNOSIS — I10 Essential (primary) hypertension: Secondary | ICD-10-CM | POA: Insufficient documentation

## 2019-04-21 LAB — CBC WITH DIFFERENTIAL/PLATELET
Abs Immature Granulocytes: 0.03 10*3/uL (ref 0.00–0.07)
Basophils Absolute: 0.2 10*3/uL — ABNORMAL HIGH (ref 0.0–0.1)
Basophils Relative: 2 %
Eosinophils Absolute: 0.4 10*3/uL (ref 0.0–0.5)
Eosinophils Relative: 4 %
HCT: 44.6 % (ref 39.0–52.0)
Hemoglobin: 15.1 g/dL (ref 13.0–17.0)
Immature Granulocytes: 0 %
Lymphocytes Relative: 44 %
Lymphs Abs: 4.1 10*3/uL — ABNORMAL HIGH (ref 0.7–4.0)
MCH: 30.6 pg (ref 26.0–34.0)
MCHC: 33.9 g/dL (ref 30.0–36.0)
MCV: 90.3 fL (ref 80.0–100.0)
Monocytes Absolute: 0.6 10*3/uL (ref 0.1–1.0)
Monocytes Relative: 7 %
Neutro Abs: 3.9 10*3/uL (ref 1.7–7.7)
Neutrophils Relative %: 43 %
Platelets: 262 10*3/uL (ref 150–400)
RBC: 4.94 MIL/uL (ref 4.22–5.81)
RDW: 14.1 % (ref 11.5–15.5)
WBC: 9.2 10*3/uL (ref 4.0–10.5)
nRBC: 0 % (ref 0.0–0.2)

## 2019-04-21 LAB — UA/M W/RFLX CULTURE, ROUTINE
Bilirubin, UA: NEGATIVE
Glucose, UA: NEGATIVE
Ketones, UA: NEGATIVE
Leukocytes,UA: NEGATIVE
Nitrite, UA: NEGATIVE
Protein,UA: NEGATIVE
Specific Gravity, UA: 1.006 (ref 1.005–1.030)
Urobilinogen, Ur: 0.2 mg/dL (ref 0.2–1.0)
pH, UA: 5.5 (ref 5.0–7.5)

## 2019-04-21 LAB — COMPREHENSIVE METABOLIC PANEL
ALT: 9 U/L (ref 0–44)
AST: 15 U/L (ref 15–41)
Albumin: 3.1 g/dL — ABNORMAL LOW (ref 3.5–5.0)
Alkaline Phosphatase: 81 U/L (ref 38–126)
Anion gap: 10 (ref 5–15)
BUN: 12 mg/dL (ref 6–20)
CO2: 21 mmol/L — ABNORMAL LOW (ref 22–32)
Calcium: 8.8 mg/dL — ABNORMAL LOW (ref 8.9–10.3)
Chloride: 113 mmol/L — ABNORMAL HIGH (ref 98–111)
Creatinine, Ser: 0.96 mg/dL (ref 0.61–1.24)
GFR calc Af Amer: >60 mL/min (ref >60)
GFR calc non Af Amer: >60 mL/min (ref >60)
Glucose, Bld: 80 mg/dL (ref 70–99)
Potassium: 3.6 mmol/L (ref 3.5–5.1)
Sodium: 144 mmol/L (ref 135–145)
Total Bilirubin: 0.4 mg/dL (ref 0.3–1.2)
Total Protein: 6.4 g/dL — ABNORMAL LOW (ref 6.5–8.1)

## 2019-04-21 LAB — LIPID PANEL
Cholesterol: 171 mg/dL (ref 0–200)
HDL: 33 mg/dL — ABNORMAL LOW (ref >40)
LDL Cholesterol: 119 mg/dL — ABNORMAL HIGH (ref 0–99)
Total CHOL/HDL Ratio: 5.2 RATIO
Triglycerides: 93 mg/dL (ref <150)
VLDL: 19 mg/dL (ref 0–40)

## 2019-04-21 LAB — T4, FREE: Free T4: 0.68 ng/dL (ref 0.61–1.12)

## 2019-04-21 LAB — PSA: Prostatic Specific Antigen: 0.43 ng/mL (ref 0.00–4.00)

## 2019-04-21 LAB — TSH: TSH: 1.134 u[IU]/mL (ref 0.350–4.500)

## 2019-04-21 LAB — MICROSCOPIC EXAMINATION
Casts: NONE SEEN /lpf
Epithelial Cells (non renal): NONE SEEN /hpf (ref 0–10)

## 2019-04-27 ENCOUNTER — Other Ambulatory Visit: Payer: Self-pay

## 2019-04-27 ENCOUNTER — Ambulatory Visit
Admission: RE | Admit: 2019-04-27 | Discharge: 2019-04-27 | Disposition: A | Discharge status: Home / Self Care | Payer: Medicare Other | Source: Ambulatory Visit | Attending: Adult Health | Admitting: Adult Health

## 2019-04-27 DIAGNOSIS — R6881 Early satiety: Secondary | ICD-10-CM | POA: Diagnosis not present

## 2019-05-04 ENCOUNTER — Other Ambulatory Visit: Payer: Self-pay | Admitting: Student

## 2019-05-04 DIAGNOSIS — R634 Abnormal weight loss: Secondary | ICD-10-CM

## 2019-05-04 DIAGNOSIS — R109 Unspecified abdominal pain: Secondary | ICD-10-CM

## 2019-05-04 DIAGNOSIS — G8929 Other chronic pain: Secondary | ICD-10-CM

## 2019-05-04 DIAGNOSIS — K5909 Other constipation: Secondary | ICD-10-CM

## 2019-05-04 DIAGNOSIS — Z87891 Personal history of nicotine dependence: Secondary | ICD-10-CM

## 2019-05-07 ENCOUNTER — Other Ambulatory Visit: Payer: Self-pay

## 2019-05-07 ENCOUNTER — Telehealth: Payer: Self-pay

## 2019-05-07 NOTE — Telephone Encounter (Signed)
lmom which med he is taking for gerd

## 2019-05-11 ENCOUNTER — Other Ambulatory Visit: Payer: Self-pay

## 2019-05-11 ENCOUNTER — Encounter: Payer: Self-pay | Admitting: Adult Health

## 2019-05-11 ENCOUNTER — Ambulatory Visit (INDEPENDENT_AMBULATORY_CARE_PROVIDER_SITE_OTHER): Payer: Medicare Other | Admitting: Adult Health

## 2019-05-11 VITALS — BP 122/86 | HR 63 | Resp 16 | Ht 68.0 in | Wt 136.0 lb

## 2019-05-11 DIAGNOSIS — R5381 Other malaise: Secondary | ICD-10-CM

## 2019-05-11 DIAGNOSIS — M0579 Rheumatoid arthritis with rheumatoid factor of multiple sites without organ or systems involvement: Secondary | ICD-10-CM

## 2019-05-11 DIAGNOSIS — R6881 Early satiety: Secondary | ICD-10-CM

## 2019-05-11 DIAGNOSIS — R5382 Chronic fatigue, unspecified: Secondary | ICD-10-CM | POA: Diagnosis not present

## 2019-05-11 DIAGNOSIS — K219 Gastro-esophageal reflux disease without esophagitis: Secondary | ICD-10-CM | POA: Diagnosis not present

## 2019-05-11 DIAGNOSIS — I10 Essential (primary) hypertension: Secondary | ICD-10-CM

## 2019-05-11 MED ORDER — OMEPRAZOLE 40 MG PO CPDR: 40.0000 mg | DELAYED_RELEASE_CAPSULE | Freq: Two times a day (BID) | ORAL | 3 refills | Status: DC

## 2019-05-11 MED ORDER — PREDNISONE 10 MG PO TABS: ORAL_TABLET | ORAL | 0 refills | Status: DC

## 2019-05-11 NOTE — Progress Notes (Signed)
[facility] [place], [place] [address]  Internal MEDICINE  Office Visit Note  Patient Name: Jose Cuevas  [id number]  [id number]  Date of Service: 05/11/2019  Chief Complaint  Patient presents with  . Medical Management of Chronic Issues    3 week follow up UGI review     HPI  Pt is here for follow up on upper gi which was negative.  He has seen GI doctor. He has a Endoscopy and colonoscopy scheduled for sept 21st.  He is currently on Sucralfate, and Linzess that GI placed him on.  He continues to have symptoms.  He has a CT scan scheduled for Thursday. He has gained 4 pounds since his last visit.         Current Medication: Outpatient Encounter Medications as of 05/11/2019  Medication Sig  . Adalimumab (HUMIRA PEN) 40 MG/0.4ML PNKT Inject into the skin. Every other week  . Aspirin-Caffeine (BC FAST PAIN RELIEF ARTHRITIS PO) Take by mouth.  . folic acid (FOLVITE) 1 MG tablet Take 1 mg by mouth daily.  Marland Kitchen ketoconazole (NIZORAL) 2 % cream Apply 1 application topically 2 (two) times daily.  . methadone (DOLOPHINE) 10 MG tablet Take 10 mg by mouth every 8 (eight) hours.  . methotrexate 50 MG/2ML injection Inject 50 mg/m2 into the vein once. 0.46mls once weekly  . omeprazole (PRILOSEC) 40 MG capsule Take 1 capsule (40 mg total) by mouth 2 (two) times daily at 10 AM and 5 PM.  . vitamin B-12 (CYANOCOBALAMIN) 1000 MCG tablet Take 1,000 mcg by mouth daily.  . [DISCONTINUED] omeprazole (PRILOSEC) 40 MG capsule Take 40 mg by mouth 2 (two) times daily at 10 AM and 5 PM.  . [DISCONTINUED] esomeprazole (NEXIUM) 40 MG capsule Take 1 capsule (40 mg total) by mouth 2 (two) times daily before a meal. (Patient not taking: Reported on 05/11/2019)   No facility-administered encounter medications on file as of 05/11/2019.     Surgical History: Past Surgical History:  Procedure Laterality Date  . BACK SURGERY    . COLONOSCOPY WITH PROPOFOL N/A 11/11/2015   Procedure:  COLONOSCOPY WITH PROPOFOL;  Surgeon: [name], MD;  Location: [facility] ENDOSCOPY;  Service: Endoscopy;  Laterality: N/A;  . COLONOSCOPY WITH PROPOFOL N/A 12/02/2015   Procedure: COLONOSCOPY WITH PROPOFOL;  Surgeon: [name], MD;  Location: [facility] ENDOSCOPY;  Service: Endoscopy;  Laterality: N/A;  . ESOPHAGOGASTRODUODENOSCOPY (EGD) WITH PROPOFOL N/A 11/11/2015   Procedure: ESOPHAGOGASTRODUODENOSCOPY (EGD) WITH PROPOFOL;  Surgeon: [name], MD;  Location: [facility] ENDOSCOPY;  Service: Endoscopy;  Laterality: N/A;  . ESOPHAGOGASTRODUODENOSCOPY (EGD) WITH PROPOFOL N/A 12/02/2015   Procedure: ESOPHAGOGASTRODUODENOSCOPY (EGD) WITH PROPOFOL;  Surgeon: [name], MD;  Location: [facility] ENDOSCOPY;  Service: Endoscopy;  Laterality: N/A;  . ESOPHAGOGASTRODUODENOSCOPY (EGD) WITH PROPOFOL N/A 01/20/2016   Procedure: ESOPHAGOGASTRODUODENOSCOPY (EGD) WITH PROPOFOL;  Surgeon: [name], MD;  Location: [facility] ENDOSCOPY;  Service: Endoscopy;  Laterality: N/A;  . KNEE RECONSTRUCTION Left     Medical History: Past Medical History:  Diagnosis Date  . Collagen vascular disease ([place])   . Hyperlipidemia   . Hypertension   . Low kidney function   . Peripheral blood vessel disorder ([place])   . Rheumatoid arthritis with rheumatoid factor (HCC)   . Sleep apnea     Family History: Family History  Problem Relation Age of Onset  . Diabetes Mother   . Diabetes Father     Social History   Socioeconomic History  .  Marital status: Married    Spouse name: Not on file  . Number of children: Not on file  . Years of education: Not on file  . Highest education level: Not on file  Occupational History  . Not on file  Social Needs  . Financial resource strain: Not on file  . Food insecurity    Worry: Not on file    Inability: Not on file  . Transportation needs    Medical: Not on file    Non-medical: Not on file  Tobacco Use  . Smoking status: Current Some Day Smoker    Types:  Cigars  . Smokeless tobacco: Former Network engineer and Sexual Activity  . Alcohol use: No  . Drug use: No  . Sexual activity: Not on file  Lifestyle  . Physical activity    Days per week: Not on file    Minutes per session: Not on file  . Stress: Not on file  Relationships  . Social Herbalist on phone: Not on file    Gets together: Not on file    Attends religious service: Not on file    Active member of club or organization: Not on file    Attends meetings of clubs or organizations: Not on file    Relationship status: Not on file  . Intimate partner violence    Fear of current or ex partner: Not on file    Emotionally abused: Not on file    Physically abused: Not on file    Forced sexual activity: Not on file  Other Topics Concern  . Not on file  Social History Narrative  . Not on file      Review of Systems  Constitutional: Negative.  Negative for chills, fatigue and unexpected weight change.  HENT: Negative.  Negative for congestion, rhinorrhea, sneezing and sore throat.   Eyes: Negative for redness.  Respiratory: Negative.  Negative for cough, chest tightness and shortness of breath.   Cardiovascular: Negative.  Negative for chest pain and palpitations.  Gastrointestinal: Negative.  Negative for abdominal pain, constipation, diarrhea, nausea and vomiting.  Endocrine: Negative.   Genitourinary: Negative.  Negative for dysuria and frequency.  Musculoskeletal: Negative.  Negative for arthralgias, back pain, joint swelling and neck pain.  Skin: Negative.  Negative for rash.  Allergic/Immunologic: Negative.   Neurological: Negative.  Negative for tremors and numbness.  Hematological: Negative for adenopathy. Does not bruise/bleed easily.  Psychiatric/Behavioral: Negative.  Negative for behavioral problems, sleep disturbance and suicidal ideas. The patient is not nervous/anxious.     Vital Signs: BP 122/86   Pulse 63   Resp 16   Ht 5\' 8"  (1.727 m)   Wt  136 lb (61.7 kg)   SpO2 99%   BMI 20.68 kg/m    Physical Exam Vitals signs and nursing note reviewed.  Constitutional:      General: He is not in acute distress.    Appearance: He is well-developed. He is not diaphoretic.  HENT:     Head: Normocephalic and atraumatic.     Mouth/Throat:     Pharynx: No oropharyngeal exudate.  Eyes:     Pupils: Pupils are equal, round, and reactive to light.  Neck:     Musculoskeletal: Normal range of motion and neck supple.     Thyroid: No thyromegaly.     Vascular: No JVD.     Trachea: No tracheal deviation.  Cardiovascular:     Rate and Rhythm: Normal rate and  regular rhythm.     Heart sounds: Normal heart sounds. No murmur. No friction rub. No gallop.   Pulmonary:     Effort: Pulmonary effort is normal. No respiratory distress.     Breath sounds: Normal breath sounds. No wheezing or rales.  Chest:     Chest wall: No tenderness.  Abdominal:     Palpations: Abdomen is soft.     Tenderness: There is no abdominal tenderness. There is no guarding.  Musculoskeletal: Normal range of motion.  Lymphadenopathy:     Cervical: No cervical adenopathy.  Skin:    General: Skin is warm and dry.  Neurological:     Mental Status: He is alert and oriented to person, place, and time.     Cranial Nerves: No cranial nerve deficit.  Psychiatric:        Behavior: Behavior normal.        Thought Content: Thought content normal.        Judgment: Judgment normal.      Assessment/Plan: 1. Chronic fatigue and malaise Continue current treatment, and follow up as scheduled.   2. Early satiety Upper GI was normal.  Continue to follow up with GI.   3. Essential hypertension, benign Stable, continue present management.   4. Gastroesophageal reflux disease without esophagitis - omeprazole (PRILOSEC) 40 MG capsule; Take 1 capsule (40 mg total) by mouth 2 (two) times daily at 10 AM and 5 PM.  Dispense: 180 capsule; Refill: 3  5. Rheumatoid arthritis  involving multiple sites with positive rheumatoid factor (HCC) - predniSONE (DELTASONE) 10 MG tablet; Use per dose pack  Dispense: 21 tablet; Refill: 0  General Counseling: Jose Cuevas verbalizes understanding of the findings of todays visit and agrees with plan of treatment. I have discussed any further diagnostic evaluation that may be needed or ordered today. We also reviewed his medications today. he has been encouraged to call the office with any questions or concerns that should arise related to todays visit.    No orders of the defined types were placed in this encounter.   Meds ordered this encounter  Medications  . omeprazole (PRILOSEC) 40 MG capsule    Sig: Take 1 capsule (40 mg total) by mouth 2 (two) times daily at 10 AM and 5 PM.    Dispense:  180 capsule    Refill:  3    Time spent: 20 Minutes   This patient was seen by Orson Gear AGNP-C in Collaboration with Dr Lavera Guise as a part of collaborative care agreement     Kendell Bane AGNP-C Internal medicine

## 2019-05-14 ENCOUNTER — Ambulatory Visit
Admission: RE | Admit: 2019-05-14 | Discharge: 2019-05-14 | Disposition: A | Discharge status: Home / Self Care | Payer: Medicare Other | Source: Ambulatory Visit | Attending: Student | Admitting: Student

## 2019-05-14 ENCOUNTER — Other Ambulatory Visit: Payer: Self-pay

## 2019-05-14 ENCOUNTER — Encounter: Payer: Self-pay | Admitting: Adult Health

## 2019-05-14 DIAGNOSIS — R109 Unspecified abdominal pain: Secondary | ICD-10-CM | POA: Diagnosis not present

## 2019-05-14 DIAGNOSIS — Z87891 Personal history of nicotine dependence: Secondary | ICD-10-CM | POA: Diagnosis present

## 2019-05-14 DIAGNOSIS — R634 Abnormal weight loss: Secondary | ICD-10-CM | POA: Insufficient documentation

## 2019-05-14 DIAGNOSIS — G8929 Other chronic pain: Secondary | ICD-10-CM | POA: Insufficient documentation

## 2019-05-14 DIAGNOSIS — K5909 Other constipation: Secondary | ICD-10-CM | POA: Insufficient documentation

## 2019-05-14 MED ORDER — IOHEXOL 300 MG/ML  SOLN
100.0000 mL | Freq: Once | INTRAMUSCULAR | Status: AC | PRN
  Administered 2019-05-14: 100 mL via INTRAVENOUS

## 2019-05-15 ENCOUNTER — Ambulatory Visit: Payer: Medicare Other | Admitting: Nurse Practitioner

## 2019-05-19 ENCOUNTER — Other Ambulatory Visit
Admission: RE | Admit: 2019-05-19 | Discharge: 2019-05-19 | Disposition: A | Discharge status: Home / Self Care | Payer: Medicare Other | Source: Ambulatory Visit | Attending: Student | Admitting: Student

## 2019-05-19 DIAGNOSIS — R109 Unspecified abdominal pain: Secondary | ICD-10-CM | POA: Insufficient documentation

## 2019-05-21 ENCOUNTER — Other Ambulatory Visit: Payer: Self-pay

## 2019-05-21 ENCOUNTER — Ambulatory Visit (INDEPENDENT_AMBULATORY_CARE_PROVIDER_SITE_OTHER): Payer: Medicare Other | Admitting: Internal Medicine

## 2019-05-21 ENCOUNTER — Encounter: Payer: Self-pay | Admitting: Internal Medicine

## 2019-05-21 VITALS — BP 130/86 | HR 62 | Resp 16 | Ht 67.0 in | Wt 132.0 lb

## 2019-05-21 DIAGNOSIS — J90 Pleural effusion, not elsewhere classified: Secondary | ICD-10-CM | POA: Diagnosis not present

## 2019-05-21 DIAGNOSIS — R0602 Shortness of breath: Secondary | ICD-10-CM

## 2019-05-21 DIAGNOSIS — R634 Abnormal weight loss: Secondary | ICD-10-CM

## 2019-05-21 DIAGNOSIS — F17200 Nicotine dependence, unspecified, uncomplicated: Secondary | ICD-10-CM

## 2019-05-21 NOTE — Progress Notes (Signed)
[facility] [place], [place] [address]  Pulmonary Sleep Medicine   Office Visit Note  Patient Name: Jose Cuevas DOB: [birth year] MRN [id number]  Date of Service: 05/21/2019  Complaints/HPI: Patient is here for consultation.  Patient has been found to have a pleural effusion on the left side this was done for an abdominal CT and he states that he not had any recent infection no pneumonia no fever no chills.  He has noted weight loss.  Patient does smoke he used to be a heavy cigarette locally now he states that he smokes cigars.  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  Patient denies having any exposure to tuberculosis.  He has not ever been incarcerated.  Denies any exposure to asbestos.  He did admit to having a weight loss and weight loss has been significant according to him not knowing exactly how much.  In addition he has some renal issues with no stones and a failing kidney.  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  CT scan was personally reviewed by me and reveals a left-sided pleural effusion Likely loculated and unfortunately we do not have the remainder of the CT of the chest since the scan was actually up the abdomen.  Has been seen by GI.   ROS  General: (-) fever, (-) chills, (-) night sweats, (-) weakness Skin: (-) rashes, (-) itching,. Eyes: (-) visual changes, (-) redness, (-) itching. Nose and Sinuses: (-) nasal stuffiness or itchiness, (-) postnasal drip, (-) nosebleeds, (-) sinus trouble. Mouth and Throat: (-) sore throat, (-) hoarseness. Neck: (-) swollen glands, (-) enlarged thyroid, (-) neck pain. Respiratory: + cough, (-) bloody sputum, + shortness of breath, - wheezing. Cardiovascular: - ankle swelling, (-) chest pain. Lymphatic: (-) lymph node enlargement. Neurologic: (-) numbness, (-) tingling. Psychiatric: (-)  anxiety, (-) depression   Current Medication: Outpatient Encounter Medications as of 05/21/2019  Medication Sig  . Adalimumab (HUMIRA PEN) 40 MG/0.4ML PNKT Inject into the skin. Every other week  . Aspirin-Caffeine (BC FAST PAIN RELIEF ARTHRITIS PO) Take by mouth.  . folic acid (FOLVITE) 1 MG tablet Take 1 mg by mouth daily.  Marland Kitchen ketoconazole (NIZORAL) 2 % cream Apply 1 application topically 2 (two) times daily.  . methadone (DOLOPHINE) 10 MG tablet Take 10 mg by mouth every 8 (eight) hours.  . methotrexate 50 MG/2ML injection Inject 50 mg/m2 into the vein once. 0.33mls once weekly  . omeprazole (PRILOSEC) 40 MG capsule Take 1 capsule (40 mg total) by mouth 2 (two) times daily at 10 AM and 5 PM.  . predniSONE (DELTASONE) 10 MG tablet Use per dose pack  . vitamin B-12 (CYANOCOBALAMIN) 1000 MCG tablet Take 1,000 mcg by mouth daily.   No facility-administered encounter medications on file as of 05/21/2019.     Surgical History: Past Surgical History:  Procedure Laterality Date  . BACK SURGERY    . COLONOSCOPY WITH PROPOFOL N/A 11/11/2015   Procedure: COLONOSCOPY WITH PROPOFOL;  Surgeon: [name], MD;  Location: [facility] ENDOSCOPY;  Service: Endoscopy;  Laterality: N/A;  . COLONOSCOPY WITH PROPOFOL N/A 12/02/2015   Procedure: COLONOSCOPY WITH PROPOFOL;  Surgeon: [name], MD;  Location: [facility] ENDOSCOPY;  Service: Endoscopy;  Laterality: N/A;  . ESOPHAGOGASTRODUODENOSCOPY (EGD) WITH PROPOFOL N/A 11/11/2015   Procedure: ESOPHAGOGASTRODUODENOSCOPY (EGD) WITH PROPOFOL;  Surgeon: [name], MD;  Location: [facility] ENDOSCOPY;  Service: Endoscopy;  Laterality: N/A;  . ESOPHAGOGASTRODUODENOSCOPY (EGD) WITH PROPOFOL N/A 12/02/2015   Procedure: ESOPHAGOGASTRODUODENOSCOPY (EGD) WITH PROPOFOL;  Surgeon: [name], MD;  Location: [facility] ENDOSCOPY;  Service: Endoscopy;  Laterality: N/A;  . ESOPHAGOGASTRODUODENOSCOPY (EGD) WITH PROPOFOL N/A 01/20/2016   Procedure:  ESOPHAGOGASTRODUODENOSCOPY (EGD) WITH PROPOFOL;  Surgeon: [name], MD;  Location: [facility] ENDOSCOPY;  Service: Endoscopy;  Laterality: N/A;  . KNEE RECONSTRUCTION Left     Medical History: Past Medical History:  Diagnosis Date  . Collagen vascular disease ([place])   . Hyperlipidemia   . Hypertension   . Low kidney function   . Peripheral blood vessel disorder ([place])   . Rheumatoid arthritis with rheumatoid factor (HCC)   . Sleep apnea     Family History: Family History  Problem Relation Age of Onset  . Diabetes Mother   . Diabetes Father     Social History: Social History   Socioeconomic History  . Marital status: Married    Spouse name: Not on file  . Number of children: Not on file  . Years of education: Not on file  . Highest education level: Not on file  Occupational History  . Not on file  Social Needs  . Financial resource strain: Not on file  . Food insecurity    Worry: Not on file    Inability: Not on file  . Transportation needs    Medical: Not on file    Non-medical: Not on file  Tobacco Use  . Smoking status: Current Some Day Smoker    Types: Cigars  . Smokeless tobacco: Former Network engineer and Sexual Activity  . Alcohol use: No  . Drug use: No  . Sexual activity: Not on file  Lifestyle  . Physical activity    Days per week: Not on file    Minutes per session: Not on file  . Stress: Not on file  Relationships  . Social Herbalist on phone: Not on file    Gets together: Not on file    Attends religious service: Not on file    Active member of club or organization: Not on file    Attends meetings of clubs or organizations: Not on file    Relationship status: Not on file  . Intimate partner violence    Fear of current or ex partner: Not on file    Emotionally abused: Not on file    Physically abused: Not on file    Forced sexual activity: Not on file  Other Topics Concern  . Not on file  Social History Narrative  . Not  on file    Vital Signs: Blood pressure 130/86, pulse 62, resp. rate 16, height 5\' 7"  (1.702 m), weight 132 lb (59.9 kg), SpO2 98 %.  Examination: General Appearance: The patient is well-developed, well-nourished, and in no distress. Skin: Gross inspection of skin unremarkable. Head: normocephalic, no gross deformities. Eyes: no gross deformities noted. ENT: ears appear grossly normal no exudates. Neck: Supple. No thyromegaly. No LAD. Respiratory: few rhonchi noted. Cardiovascular: Normal S1 and S2 without murmur or rub. Extremities: No cyanosis. pulses are equal. Neurologic: Alert and oriented. No involuntary movements.  LABS: Recent Results (from the past 2160 hour(s))  UA/M w/rflx Culture, Routine     Status: Abnormal   Collection Time: 04/20/19  2:21 PM   Specimen: Urine   URINE  Result Value Ref Range   Specific  Gravity, UA 1.006 1.005 - 1.030   pH, UA 5.5 5.0 - 7.5   Color, UA Yellow Yellow   Appearance Ur Clear Clear   Leukocytes,UA Negative Negative   Protein,UA Negative Negative/Trace   Glucose, UA Negative Negative   Ketones, UA Negative Negative   RBC, UA 2+ (A) Negative   Bilirubin, UA Negative Negative   Urobilinogen, Ur 0.2 0.2 - 1.0 mg/dL   Nitrite, UA Negative Negative   Microscopic Examination See below:     Comment: Microscopic was indicated and was performed.   Urinalysis Reflex Comment     Comment: This specimen will not reflex to a Urine Culture.  Microscopic Examination     Status: Abnormal   Collection Time: 04/20/19  2:21 PM   URINE  Result Value Ref Range   WBC, UA 0-5 0 - 5 /hpf   RBC 3-10 (A) 0 - 2 /hpf   Epithelial Cells (non renal) None seen 0 - 10 /hpf   Casts None seen None seen /lpf   Mucus, UA Present Not Estab.   Bacteria, UA Few None seen/Few  CBC with Differential/Platelet     Status: Abnormal   Collection Time: 04/21/19  9:45 AM  Result Value Ref Range   WBC 9.2 4.0 - 10.5 K/uL   RBC 4.94 4.22 - 5.81 MIL/uL   Hemoglobin 15.1  13.0 - 17.0 g/dL   HCT 44.6 39.0 - 52.0 %   MCV 90.3 80.0 - 100.0 fL   MCH 30.6 26.0 - 34.0 pg   MCHC 33.9 30.0 - 36.0 g/dL   RDW 14.1 11.5 - 15.5 %   Platelets 262 150 - 400 K/uL   nRBC 0.0 0.0 - 0.2 %   Neutrophils Relative % 43 %   Neutro Abs 3.9 1.7 - 7.7 K/uL   Lymphocytes Relative 44 %   Lymphs Abs 4.1 (H) 0.7 - 4.0 K/uL   Monocytes Relative 7 %   Monocytes Absolute 0.6 0.1 - 1.0 K/uL   Eosinophils Relative 4 %   Eosinophils Absolute 0.4 0.0 - 0.5 K/uL   Basophils Relative 2 %   Basophils Absolute 0.2 (H) 0.0 - 0.1 K/uL   Immature Granulocytes 0 %   Abs Immature Granulocytes 0.03 0.00 - 0.07 K/uL    Comment: Performed at [facility], [address] [place], [place] [address]  Lipid panel     Status: Abnormal   Collection Time: 04/21/19  9:45 AM  Result Value Ref Range   Cholesterol 171 0 - 200 mg/dL   Triglycerides 93 <150 mg/dL   HDL 33 (L) >40 mg/dL   Total CHOL/HDL Ratio 5.2 RATIO   VLDL 19 0 - 40 mg/dL   LDL Cholesterol 119 (H) 0 - 99 mg/dL    Comment:        Total Cholesterol/HDL:CHD Risk Coronary Heart Disease Risk Table                     Men   Women  1/2 Average Risk   3.4   3.3  Average Risk       5.0   4.4  2 X Average Risk   9.6   7.1  3 X Average Risk  23.4   11.0        Use the calculated Patient Ratio above and the CHD Risk Table to determine the patient's CHD Risk.        ATP III CLASSIFICATION (LDL):  <100     mg/dL  Optimal  100-129  mg/dL   Near or Above                    Optimal  130-159  mg/dL   Borderline  160-189  mg/dL   High  >190     mg/dL   Very High Performed at [facility], [address] [place], [place] [address]   TSH     Status: None   Collection Time: 04/21/19  9:45 AM  Result Value Ref Range   TSH 1.134 0.350 - 4.500 uIU/mL    Comment: Performed by a 3rd Generation assay with a functional sensitivity of <=0.01 uIU/mL. Performed at [facility], [address] [place], [place]  [address]   T4, free     Status: None   Collection Time: 04/21/19  9:45 AM  Result Value Ref Range   Free T4 0.68 0.61 - 1.12 ng/dL    Comment: (NOTE) Biotin ingestion may interfere with free T4 tests. If the results are inconsistent with the TSH level, previous test results, or the clinical presentation, then consider biotin interference. If needed, order repeat testing after stopping biotin. Performed at [facility], [address] [place], [place] [address]   Comprehensive metabolic panel     Status: Abnormal   Collection Time: 04/21/19  9:45 AM  Result Value Ref Range   Sodium 144 135 - 145 mmol/L   Potassium 3.6 3.5 - 5.1 mmol/L   Chloride 113 (H) 98 - 111 mmol/L   CO2 21 (L) 22 - 32 mmol/L   Glucose, Bld 80 70 - 99 mg/dL   BUN 12 6 - 20 mg/dL   Creatinine, Ser 0.96 0.61 - 1.24 mg/dL   Calcium 8.8 (L) 8.9 - 10.3 mg/dL   Total Protein 6.4 (L) 6.5 - 8.1 g/dL   Albumin 3.1 (L) 3.5 - 5.0 g/dL   AST 15 15 - 41 U/L   ALT 9 0 - 44 U/L   Alkaline Phosphatase 81 38 - 126 U/L   Total Bilirubin 0.4 0.3 - 1.2 mg/dL   GFR calc non Af Amer >60 >60 mL/min   GFR calc Af Amer >60 >60 mL/min   Anion gap 10 5 - 15    Comment: Performed at [facility], [address] [place], [place] [address]  PSA     Status: None   Collection Time: 04/21/19  9:45 AM  Result Value Ref Range   Prostatic Specific Antigen 0.43 0.00 - 4.00 ng/mL    Comment: (NOTE) While PSA levels of <=4.0 ng/ml are reported as reference range, some men with levels below 4.0 ng/ml can have prostate cancer and many men with PSA above 4.0 ng/ml do not have prostate cancer.  Other tests such as free PSA, age specific reference ranges, PSA velocity and PSA doubling time may be helpful especially in men less than 39 years old. Performed at [facility], [place] [address] [place], [place] [address]     Radiology: No results found.  No results found.  Ct Abdomen Pelvis W Contrast  Result Date:  05/14/2019 CLINICAL DATA:  Loss of appetite with weight loss.  Abdominal pain. EXAM: CT ABDOMEN AND PELVIS WITH CONTRAST TECHNIQUE: Multidetector CT imaging of the abdomen and pelvis was performed using the standard protocol following bolus administration of intravenous contrast. CONTRAST:  169mL OMNIPAQUE IOHEXOL 300 MG/ML  SOLN COMPARISON:  08/20/2016 FINDINGS: Lower chest: Moderate left pleural effusion with some probable loculation. There  is lingular and left lower lobe atelectasis adjacent to the pleural fluid. Hepatobiliary: No suspicious focal abnormality within the liver parenchyma. There is no evidence for gallstones, gallbladder wall thickening, or pericholecystic fluid. No intrahepatic or extrahepatic biliary dilation. Pancreas: No focal mass lesion. No dilatation of the main duct. No intraparenchymal cyst. No peripancreatic edema. Spleen: No splenomegaly. No focal mass lesion. Adrenals/Urinary Tract: No adrenal nodule or mass. Similar appearance of markedly atrophic left kidney with 1.3 x 0.6 cm stone in the left renal pelvis. 2 mm nonobstructing stone identified interpolar right kidney 1.6 cm homogeneous well-defined low-density lesion lower pole right kidney has increased slightly in the interval, compatible with cyst. 1.3 cm low-density lesion posterior upper pole right kidney was 1.2 cm previously, also compatible with cyst. Other tiny low-density right renal lesions are too small to characterize. No evidence for hydroureter. The urinary bladder appears normal for the degree of distention. Stomach/Bowel: Stomach is unremarkable. No gastric wall thickening. No evidence of outlet obstruction. Duodenum is normally positioned as is the ligament of Treitz. No small bowel wall thickening. No small bowel dilatation. The terminal ileum is normal. The appendix is not visualized, but there is no edema or inflammation in the region of the cecum. No gross colonic mass. No colonic wall thickening.  Vascular/Lymphatic: There is abdominal aortic atherosclerosis without aneurysm. There is no gastrohepatic or hepatoduodenal ligament lymphadenopathy. No intraperitoneal or retroperitoneal lymphadenopathy. Reproductive: The prostate gland and seminal vesicles are unremarkable. Other: No intraperitoneal free fluid. Musculoskeletal: No worrisome lytic or sclerotic osseous abnormality. IMPRESSION: 1. Interval development of a moderate left pleural effusion, potentially with some loculation. Given unilateral involvement of the chest, neoplasm is a consideration. CT chest with contrast recommended to further evaluate. 2. No acute findings in the abdomen/pelvis. 3. Atrophic left kidney with 1.3 x 0.6 cm stone in the left renal pelvis. No substantial hydronephrosis. 4.  Aortic Atherosclerois (ICD10-170.0) These results will be called to the ordering clinician or representative by the Radiologist Assistant, and communication documented in the PACS or zVision Dashboard. 5. Electronically Signed   By: Misty Stanley M.D.   On: 05/14/2019 20:30   Dg Duanne Limerick W Single Cm (sol Or Thin Ba)  Result Date: 04/27/2019 CLINICAL DATA:  Early satiety, abdominal bloating, history of gastroparesis, history of esophageal stricture EXAM: UPPER GI SERIES WITHOUT KUB TECHNIQUE: Routine upper GI series was performed with thin/high density/water soluble barium. FLUOROSCOPY TIME:  Fluoroscopy Time:  1:12 Number of Acquired Spot Images: 35 COMPARISON:  09/28/2015 FINDINGS: Normal oropharyngeal phase of swallow. No evidence of penetration or aspiration. Normal contour and caliber of the esophagus. No evidence of mass or stricture. A swallowed barium tablet passes readily through the gastroesophageal junction. No evidence of spontaneous or provoked reflux. Normal partial double contrast appearance of the stomach and proximal small bowel. IMPRESSION: Normal fluoroscopic upper GI examination. No evidence of esophageal mass or stricture. Electronically  Signed   By: Eddie Candle M.D.   On: 04/27/2019 09:29      Assessment and Plan: Patient Active Problem List   Diagnosis Date Noted  . Encounter for general adult medical examination with abnormal findings 04/22/2018  . Tick bite of abdominal wall 04/22/2018  . Candida rash of groin 04/22/2018    1. PLeural Effusion on the left side unclear etiology appears to be loculated could be postinfectious inflammatory but also could be more worrisome and concerning for malignancy.  He needs to definitely have a CT scan of the chest x-ray there is no  pulmonary nodules.  I also did look at his last CT scan which was apparently done in 2017 and he had normal looking lung architecture at that time. 2. Weight loss concerning for possibility of malignancy again CT scan of the chest will be ordered if there is abnormal nodules then he will need to have PET scan 3. Smoker smoking cessation counseling patient is now smoking cigars 4. SOB pulmonary function studies will be ordered to assess degree of COPD and severity.  General Counseling: I have discussed the findings of the evaluation and examination with Nicole Kindred.  I have also discussed any further diagnostic evaluation thatmay be needed or ordered today. Copeland verbalizes understanding of the findings of todays visit. We also reviewed his medications today and discussed drug interactions and side effects including but not limited excessive drowsiness and altered mental states. We also discussed that there is always a risk not just to him but also people around him. he has been encouraged to call the office with any questions or concerns that should arise related to todays visit.    Time spent: 63min  I have personally obtained a history, examined the patient, evaluated laboratory and imaging results, formulated the assessment and plan and placed orders.    Allyne Gee, MD [facility] Pulmonary and Critical Care Sleep medicine

## 2019-05-21 NOTE — Patient Instructions (Signed)
Pleural Effusion °Pleural effusion is an abnormal buildup of fluid in the layers of tissue between the lungs and the inside of the chest (pleural space) The two layers of tissue that line the lungs and the inside of the chest are called pleura. Usually, there is no air in the space between the pleura, only a thin layer of fluid. Some conditions can cause a large amount of fluid to build up, which can cause the lung to collapse if untreated. A pleural effusion is usually caused by another disease that requires treatment. °What are the causes? °Pleural effusion can be caused by: °· Heart failure. °· Certain infections, such as pneumonia or tuberculosis. °· Cancer. °· A blood clot in the lung (pulmonary embolism). °· Complications from surgery, such as from open heart surgery. °· Liver disease (cirrhosis). °· Kidney disease. °What are the signs or symptoms? °In some cases, pleural effusion may cause no symptoms. If symptoms are present, they may include: °· Shortness of breath, especially when lying down. °· Chest pain. This may get worse when taking a deep breath. °· Fever. °· Dry, long-lasting (chronic) cough. °· Hiccups. °· Rapid breathing. °An underlying condition that is causing the pleural effusion (such as heart failure, pneumonia, blood clots, tuberculosis, or cancer) may also cause other symptoms. °How is this diagnosed? °This condition may be diagnosed based on: °· Your symptoms and medical history. °· A physical exam. °· A chest X-ray. °· A procedure to use a needle to remove fluid from the pleural space (thoracentesis). This fluid is tested. °· Other imaging studies of the chest, such as ultrasound or CT scan. °How is this treated? °Depending on the cause of your condition, treatment may include: °· Treating the underlying condition that is causing the effusion. When that condition improves, the effusion will also improve. Examples of treatment for underlying conditions include: °? Antibiotic medicines to  treat an infection. °? Diuretics or other heart medicines to treat heart failure. °· Thoracentesis. °· Placing a thin flexible tube under your skin and into your chest to continuously drain the effusion (indwelling pleural catheter). °· Surgery to remove the outer layer of tissue from the pleural space (decortication). °· A procedure to put medicine into the chest cavity to seal the pleural space and prevent fluid buildup (pleurodesis). °· Chemotherapy and radiation therapy, if you have cancerous (malignant) pleural effusion. These treatments are typically used to treat cancer. They kill certain cells in the body. °Follow these instructions at home: °· Take over-the-counter and prescription medicines only as told by your health care provider. °· Ask your health care provider what activities are safe for you. °· Keep track of how long you are able to do mild exercise (such as walking) before you get short of breath. Write down this information to share with your health care provider. Your ability to exercise should improve over time. °· Do not use any products that contain nicotine or tobacco, such as cigarettes and e-cigarettes. If you need help quitting, ask your health care provider. °· Keep all follow-up visits as told by your health care provider. This is important. °Contact a health care provider if: °· The amount of time that you are able to do mild exercise: °? Decreases. °? Does not improve with time. °· You have a fever. °Get help right away if: °· You are short of breath. °· You develop chest pain. °· You develop a new cough. °Summary °· Pleural effusion is an abnormal buildup of fluid in the layers   of tissue between the lungs and the inside of the chest. °· Pleural effusion can have many causes, including heart failure, pulmonary embolism, infections, or cancer. °· Symptoms of pleural effusion can include shortness of breath, chest pain, fever, long-lasting (chronic) cough, hiccups, or rapid  breathing. °· Diagnosis often involves making images of the chest (such as with ultrasound or X-ray) and removing fluid (thoracentesis) to send for testing. °· Treatment for pleural effusion depends on what underlying condition is causing it. °This information is not intended to replace advice given to you by your health care provider. Make sure you discuss any questions you have with your health care provider. °Document Released: 09/17/2005 Document Revised: 08/30/2017 Document Reviewed: 05/23/2017 °Elsevier Patient Education © 2020 [employer] ° °

## 2019-05-25 LAB — PANCREATIC ELASTASE, FECAL: Pancreatic Elastase-1, Stool: 237 ug Elast./g (ref >200)

## 2019-05-28 ENCOUNTER — Ambulatory Visit
Admission: RE | Admit: 2019-05-28 | Discharge: 2019-05-28 | Disposition: A | Discharge status: Home / Self Care | Payer: Medicare Other | Source: Ambulatory Visit | Attending: Internal Medicine | Admitting: Internal Medicine

## 2019-05-28 ENCOUNTER — Other Ambulatory Visit: Payer: Self-pay

## 2019-05-28 DIAGNOSIS — J90 Pleural effusion, not elsewhere classified: Secondary | ICD-10-CM | POA: Diagnosis not present

## 2019-05-28 MED ORDER — IOHEXOL 300 MG/ML  SOLN
75.0000 mL | Freq: Once | INTRAMUSCULAR | Status: AC | PRN
  Administered 2019-05-28: 09:00:00 50 mL via INTRAVENOUS

## 2019-06-10 ENCOUNTER — Ambulatory Visit: Payer: Medicare Other | Admitting: Internal Medicine

## 2019-06-10 ENCOUNTER — Other Ambulatory Visit: Payer: Self-pay

## 2019-06-10 DIAGNOSIS — R0602 Shortness of breath: Secondary | ICD-10-CM

## 2019-06-10 LAB — PULMONARY FUNCTION TEST

## 2019-06-16 ENCOUNTER — Encounter: Payer: Self-pay | Admitting: Internal Medicine

## 2019-06-16 ENCOUNTER — Other Ambulatory Visit: Payer: Self-pay

## 2019-06-16 ENCOUNTER — Ambulatory Visit (INDEPENDENT_AMBULATORY_CARE_PROVIDER_SITE_OTHER): Payer: Medicare Other | Admitting: Internal Medicine

## 2019-06-16 VITALS — BP 155/90 | HR 62 | Resp 16 | Ht 67.0 in | Wt 131.0 lb

## 2019-06-16 DIAGNOSIS — R6881 Early satiety: Secondary | ICD-10-CM

## 2019-06-16 DIAGNOSIS — R634 Abnormal weight loss: Secondary | ICD-10-CM

## 2019-06-16 DIAGNOSIS — J90 Pleural effusion, not elsewhere classified: Secondary | ICD-10-CM

## 2019-06-16 DIAGNOSIS — F17200 Nicotine dependence, unspecified, uncomplicated: Secondary | ICD-10-CM | POA: Diagnosis not present

## 2019-06-16 MED ORDER — METOCLOPRAMIDE HCL 5 MG PO TABS: 5.0000 mg | ORAL_TABLET | Freq: Two times a day (BID) | ORAL | 0 refills | Status: DC

## 2019-06-16 NOTE — Progress Notes (Signed)
[place] [place], [place] [address]  Pulmonary Sleep Medicine   Office Visit Note  Patient Name: Jose Cuevas DOB: June 28, 1961 MRN [id number]  Date of Service: 06/16/2019  Complaints/HPI: Pt is here for follow up on Ct results.  Patient has multiple findings on most recent chest CT.  Of most concern is left pleural effusion that is moderate in size.  Radiologist suspects that it is mildly loculated with some comprehensive atelectasis and recommends sampling to exclude a malignant effusion.  There is also patchy patchy groundglass opacity scattered throughout the right lung.  See further result below.  ROS  General: (-) fever, (-) chills, (-) night sweats, (-) weakness Skin: (-) rashes, (-) itching,. Eyes: (-) visual changes, (-) redness, (-) itching. Nose and Sinuses: (-) nasal stuffiness or itchiness, (-) postnasal drip, (-) nosebleeds, (-) sinus trouble. Mouth and Throat: (-) sore throat, (-) hoarseness. Neck: (-) swollen glands, (-) enlarged thyroid, (-) neck pain. Respiratory: - cough, (-) bloody sputum, - shortness of breath, - wheezing. Cardiovascular: - ankle swelling, (-) chest pain. Lymphatic: (-) lymph node enlargement. Neurologic: (-) numbness, (-) tingling. Psychiatric: (-) anxiety, (-) depression   Current Medication: Outpatient Encounter Medications as of 06/16/2019  Medication Sig  . Adalimumab (HUMIRA PEN) 40 MG/0.4ML PNKT Inject into the skin. Every other week  . Aspirin-Caffeine (BC FAST PAIN RELIEF ARTHRITIS PO) Take by mouth.  . folic acid (FOLVITE) 1 MG tablet Take 1 mg by mouth daily.  Marland Kitchen ketoconazole (NIZORAL) 2 % cream Apply 1 application topically 2 (two) times daily.  . methadone (DOLOPHINE) 10 MG tablet Take 10 mg by mouth every 8 (eight) hours.  . methotrexate 50 MG/2ML injection Inject 50 mg/m2 into the vein once. 0.33mls once weekly  . omeprazole (PRILOSEC) 40 MG capsule Take 1 capsule (40 mg total) by mouth 2 (two) times  daily at 10 AM and 5 PM.  . predniSONE (DELTASONE) 10 MG tablet Use per dose pack  . vitamin B-12 (CYANOCOBALAMIN) 1000 MCG tablet Take 1,000 mcg by mouth daily.   No facility-administered encounter medications on file as of 06/16/2019.     Surgical History: Past Surgical History:  Procedure Laterality Date  . BACK SURGERY    . COLONOSCOPY WITH PROPOFOL N/A 11/11/2015   Procedure: COLONOSCOPY WITH PROPOFOL;  Surgeon: [name], MD;  Location: [facility] ENDOSCOPY;  Service: Endoscopy;  Laterality: N/A;  . COLONOSCOPY WITH PROPOFOL N/A 12/02/2015   Procedure: COLONOSCOPY WITH PROPOFOL;  Surgeon: [name], MD;  Location: [facility] ENDOSCOPY;  Service: Endoscopy;  Laterality: N/A;  . ESOPHAGOGASTRODUODENOSCOPY (EGD) WITH PROPOFOL N/A 11/11/2015   Procedure: ESOPHAGOGASTRODUODENOSCOPY (EGD) WITH PROPOFOL;  Surgeon: [name], MD;  Location: [facility] ENDOSCOPY;  Service: Endoscopy;  Laterality: N/A;  . ESOPHAGOGASTRODUODENOSCOPY (EGD) WITH PROPOFOL N/A 12/02/2015   Procedure: ESOPHAGOGASTRODUODENOSCOPY (EGD) WITH PROPOFOL;  Surgeon: [name], MD;  Location: [facility] ENDOSCOPY;  Service: Endoscopy;  Laterality: N/A;  . ESOPHAGOGASTRODUODENOSCOPY (EGD) WITH PROPOFOL N/A 01/20/2016   Procedure: ESOPHAGOGASTRODUODENOSCOPY (EGD) WITH PROPOFOL;  Surgeon: [name], MD;  Location: [facility] ENDOSCOPY;  Service: Endoscopy;  Laterality: N/A;  . KNEE RECONSTRUCTION Left     Medical History: Past Medical History:  Diagnosis Date  . Collagen vascular disease (HCC)    Rhematoid Arthritis hx.  . Hyperlipidemia   . Hypertension   . Low kidney function   . Peripheral blood vessel disorder ([place])   . Rheumatoid arthritis with rheumatoid factor (HCC)   . Sleep apnea  Family History: Family History  Problem Relation Age of Onset  . Diabetes Mother   . Diabetes Father     Social History: Social History   Socioeconomic History  . Marital status: Married    Spouse name:  Not on file  . Number of children: Not on file  . Years of education: Not on file  . Highest education level: Not on file  Occupational History  . Not on file  Social Needs  . Financial resource strain: Not on file  . Food insecurity    Worry: Not on file    Inability: Not on file  . Transportation needs    Medical: Not on file    Non-medical: Not on file  Tobacco Use  . Smoking status: Current Some Day Smoker    Types: Cigars  . Smokeless tobacco: Former Network engineer and Sexual Activity  . Alcohol use: No  . Drug use: No  . Sexual activity: Not on file  Lifestyle  . Physical activity    Days per week: Not on file    Minutes per session: Not on file  . Stress: Not on file  Relationships  . Social Herbalist on phone: Not on file    Gets together: Not on file    Attends religious service: Not on file    Active member of club or organization: Not on file    Attends meetings of clubs or organizations: Not on file    Relationship status: Not on file  . Intimate partner violence    Fear of current or ex partner: Not on file    Emotionally abused: Not on file    Physically abused: Not on file    Forced sexual activity: Not on file  Other Topics Concern  . Not on file  Social History Narrative  . Not on file    Vital Signs: Blood pressure (!) 155/90, pulse 62, resp. rate 16, height 5\' 7"  (1.702 m), weight 131 lb (59.4 kg), SpO2 97 %.  Examination: General Appearance: The patient is well-developed, well-nourished, and in no distress. Skin: Gross inspection of skin unremarkable. Head: normocephalic, no gross deformities. Eyes: no gross deformities noted. ENT: ears appear grossly normal no exudates. Neck: Supple. No thyromegaly. No LAD. Respiratory: clear bilaterally. Cardiovascular: Normal S1 and S2 without murmur or rub. Extremities: No cyanosis. pulses are equal. Neurologic: Alert and oriented. No involuntary movements.  LABS: Recent Results (from  the past 2160 hour(s))  UA/M w/rflx Culture, Routine     Status: Abnormal   Collection Time: 04/20/19  2:21 PM   Specimen: Urine   URINE  Result Value Ref Range   Specific Gravity, UA 1.006 1.005 - 1.030   pH, UA 5.5 5.0 - 7.5   Color, UA Yellow Yellow   Appearance Ur Clear Clear   Leukocytes,UA Negative Negative   Protein,UA Negative Negative/Trace   Glucose, UA Negative Negative   Ketones, UA Negative Negative   RBC, UA 2+ (A) Negative   Bilirubin, UA Negative Negative   Urobilinogen, Ur 0.2 0.2 - 1.0 mg/dL   Nitrite, UA Negative Negative   Microscopic Examination See below:     Comment: Microscopic was indicated and was performed.   Urinalysis Reflex Comment     Comment: This specimen will not reflex to a Urine Culture.  Microscopic Examination     Status: Abnormal   Collection Time: 04/20/19  2:21 PM   URINE  Result Value Ref Range   WBC,  UA 0-5 0 - 5 /hpf   RBC 3-10 (A) 0 - 2 /hpf   Epithelial Cells (non renal) None seen 0 - 10 /hpf   Casts None seen None seen /lpf   Mucus, UA Present Not Estab.   Bacteria, UA Few None seen/Few  CBC with Differential/Platelet     Status: Abnormal   Collection Time: 04/21/19  9:45 AM  Result Value Ref Range   WBC 9.2 4.0 - 10.5 K/uL   RBC 4.94 4.22 - 5.81 MIL/uL   Hemoglobin 15.1 13.0 - 17.0 g/dL   HCT 44.6 39.0 - 52.0 %   MCV 90.3 80.0 - 100.0 fL   MCH 30.6 26.0 - 34.0 pg   MCHC 33.9 30.0 - 36.0 g/dL   RDW 14.1 11.5 - 15.5 %   Platelets 262 150 - 400 K/uL   nRBC 0.0 0.0 - 0.2 %   Neutrophils Relative % 43 %   Neutro Abs 3.9 1.7 - 7.7 K/uL   Lymphocytes Relative 44 %   Lymphs Abs 4.1 (H) 0.7 - 4.0 K/uL   Monocytes Relative 7 %   Monocytes Absolute 0.6 0.1 - 1.0 K/uL   Eosinophils Relative 4 %   Eosinophils Absolute 0.4 0.0 - 0.5 K/uL   Basophils Relative 2 %   Basophils Absolute 0.2 (H) 0.0 - 0.1 K/uL   Immature Granulocytes 0 %   Abs Immature Granulocytes 0.03 0.00 - 0.07 K/uL    Comment: Performed at [facility], [address] [place], [place] [address]  Lipid panel     Status: Abnormal   Collection Time: 04/21/19  9:45 AM  Result Value Ref Range   Cholesterol 171 0 - 200 mg/dL   Triglycerides 93 <150 mg/dL   HDL 33 (L) >40 mg/dL   Total CHOL/HDL Ratio 5.2 RATIO   VLDL 19 0 - 40 mg/dL   LDL Cholesterol 119 (H) 0 - 99 mg/dL    Comment:        Total Cholesterol/HDL:CHD Risk Coronary Heart Disease Risk Table                     Men   Women  1/2 Average Risk   3.4   3.3  Average Risk       5.0   4.4  2 X Average Risk   9.6   7.1  3 X Average Risk  23.4   11.0        Use the calculated Patient Ratio above and the CHD Risk Table to determine the patient's CHD Risk.        ATP III CLASSIFICATION (LDL):  <100     mg/dL   Optimal  100-129  mg/dL   Near or Above                    Optimal  130-159  mg/dL   Borderline  160-189  mg/dL   High  >190     mg/dL   Very High Performed at [facility], [address] [place], [place] [address]   TSH     Status: None   Collection Time: 04/21/19  9:45 AM  Result Value Ref Range   TSH 1.134 0.350 - 4.500 uIU/mL    Comment: Performed by a 3rd Generation assay with a functional sensitivity of <=0.01 uIU/mL. Performed at [facility], [address] [place], [place] [address]   T4, free     Status: None  Collection Time: 04/21/19  9:45 AM  Result Value Ref Range   Free T4 0.68 0.61 - 1.12 ng/dL    Comment: (NOTE) Biotin ingestion may interfere with free T4 tests. If the results are inconsistent with the TSH level, previous test results, or the clinical presentation, then consider biotin interference. If needed, order repeat testing after stopping biotin. Performed at [facility], [address] [place], [place] [address]   Comprehensive metabolic panel     Status: Abnormal   Collection Time: 04/21/19  9:45 AM  Result Value Ref Range   Sodium 144 135 - 145 mmol/L   Potassium 3.6 3.5 - 5.1 mmol/L    Chloride 113 (H) 98 - 111 mmol/L   CO2 21 (L) 22 - 32 mmol/L   Glucose, Bld 80 70 - 99 mg/dL   BUN 12 6 - 20 mg/dL   Creatinine, Ser 0.96 0.61 - 1.24 mg/dL   Calcium 8.8 (L) 8.9 - 10.3 mg/dL   Total Protein 6.4 (L) 6.5 - 8.1 g/dL   Albumin 3.1 (L) 3.5 - 5.0 g/dL   AST 15 15 - 41 U/L   ALT 9 0 - 44 U/L   Alkaline Phosphatase 81 38 - 126 U/L   Total Bilirubin 0.4 0.3 - 1.2 mg/dL   GFR calc non Af Amer >60 >60 mL/min   GFR calc Af Amer >60 >60 mL/min   Anion gap 10 5 - 15    Comment: Performed at [facility], [address], [place], [place] [address]  PSA     Status: None   Collection Time: 04/21/19  9:45 AM  Result Value Ref Range   Prostatic Specific Antigen 0.43 0.00 - 4.00 ng/mL    Comment: (NOTE) While PSA levels of <=4.0 ng/ml are reported as reference range, some men with levels below 4.0 ng/ml can have prostate cancer and many men with PSA above 4.0 ng/ml do not have prostate cancer.  Other tests such as free PSA, age specific reference ranges, PSA velocity and PSA doubling time may be helpful especially in men less than 29 years old. Performed at [facility], [place] [address] [place], [place] [address]   Pancreatic elastase, fecal     Status: None   Collection Time: 05/19/19  9:50 AM  Result Value Ref Range   Pancreatic Elastase-1, Stool 237 >200 ug Elast./g    Comment: (NOTE)       Severe Pancreatic Insufficiency:          <100       Moderate Pancreatic Insufficiency:   100 - 200       Normal:                                   >200 Performed At: [facility] [place], [place] [id number] Rush Farmer MD [id number]     Radiology: Ct Chest W Contrast  Result Date: 05/28/2019 CLINICAL DATA:  Pleural effusion. Follow-up CT abdomen and pelvis performed on 05/14/2019 and chest x-ray of 05/14/2019. LEFT-sided chest tightness. Current smoker. EXAM: CT CHEST WITH CONTRAST TECHNIQUE: Multidetector CT imaging of the chest was  performed during intravenous contrast administration. CONTRAST:  68mL OMNIPAQUE IOHEXOL 300 MG/ML  SOLN COMPARISON:  CT abdomen dated 05/14/2019. FINDINGS: Cardiovascular: Aortic atherosclerosis. Heart size is within normal limits. No pericardial effusion. Mediastinum/Nodes: Borderline pathologic by size criteria lymph nodes within the bilateral hilar regions and  aortopulmonary window region of the mediastinum. Esophagus is unremarkable. Trachea and central bronchi are unremarkable. Lungs/Pleura: LEFT pleural effusion, moderate in size, and suspected to be at least mildly loculated with associated compressive atelectasis. Patchy ground-glass opacities scattered throughout the periphery of the RIGHT lung, most likely related to chronic interstitial lung disease/fibrosis. 5 mm pulmonary nodule within the RIGHT lung apex (series 3, image 33). Additional 7 mm pulmonary nodule within the RIGHT middle lobe (image 86). No pulmonary mass. No pneumonia. Emphysematous changes bilaterally, moderate in degree, upper lobe predominant. Upper Abdomen: No acute findings. Atrophic LEFT kidney. Musculoskeletal: Mild degenerative spondylosis of the kyphotic thoracolumbar spine. No acute or suspicious osseous abnormality. IMPRESSION: 1. LEFT pleural effusion, moderate in size, suspected to be at least mildly loculated with associated compressive atelectasis. Consider sampling to exclude malignant effusion. 2. Patchy ground-glass opacities scattered throughout the periphery of the RIGHT lung. Differential includes atypical pneumonias such as viral or fungal, interstitial pneumonias, edema related to volume overload/CHF, chronic interstitial diseases, hypersensitivity pneumonitis, and respiratory bronchiolitis. Favor chronic interstitial lung disease/fibrosis. 3. Borderline pathologic by size criteria lymph nodes within the bilateral hilar regions and aortopulmonary window region of the mediastinum, possibly reactive in nature. Recommend  follow-up chest CT in 3-6 months to ensure stability or resolution. 4. Aortic atherosclerosis. 5. 5 mm pulmonary nodule within the RIGHT lung apex and 7 mm pulmonary nodule within the RIGHT middle lobe. Non-contrast chest CT at 3-6 months is recommended. If the nodules are stable at time of repeat CT, then future CT at 18-24 months (from today's scan) is considered optional for low-risk patients, but is recommended for high-risk patients. This recommendation follows the consensus statement: Guidelines for Management of Incidental Pulmonary Nodules Detected on CT Images: From the Fleischner Society 2017; Radiology 2017; 284:228-243. 6. Atrophic LEFT kidney. Aortic Atherosclerosis (ICD10-I70.0) and Emphysema (ICD10-J43.9). Electronically Signed   By: Franki Cabot M.D.   On: 05/28/2019 10:11    No results found.  Ct Chest W Contrast  Result Date: 05/28/2019 CLINICAL DATA:  Pleural effusion. Follow-up CT abdomen and pelvis performed on 05/14/2019 and chest x-ray of 05/14/2019. LEFT-sided chest tightness. Current smoker. EXAM: CT CHEST WITH CONTRAST TECHNIQUE: Multidetector CT imaging of the chest was performed during intravenous contrast administration. CONTRAST:  70mL OMNIPAQUE IOHEXOL 300 MG/ML  SOLN COMPARISON:  CT abdomen dated 05/14/2019. FINDINGS: Cardiovascular: Aortic atherosclerosis. Heart size is within normal limits. No pericardial effusion. Mediastinum/Nodes: Borderline pathologic by size criteria lymph nodes within the bilateral hilar regions and aortopulmonary window region of the mediastinum. Esophagus is unremarkable. Trachea and central bronchi are unremarkable. Lungs/Pleura: LEFT pleural effusion, moderate in size, and suspected to be at least mildly loculated with associated compressive atelectasis. Patchy ground-glass opacities scattered throughout the periphery of the RIGHT lung, most likely related to chronic interstitial lung disease/fibrosis. 5 mm pulmonary nodule within the RIGHT lung  apex (series 3, image 33). Additional 7 mm pulmonary nodule within the RIGHT middle lobe (image 86). No pulmonary mass. No pneumonia. Emphysematous changes bilaterally, moderate in degree, upper lobe predominant. Upper Abdomen: No acute findings. Atrophic LEFT kidney. Musculoskeletal: Mild degenerative spondylosis of the kyphotic thoracolumbar spine. No acute or suspicious osseous abnormality. IMPRESSION: 1. LEFT pleural effusion, moderate in size, suspected to be at least mildly loculated with associated compressive atelectasis. Consider sampling to exclude malignant effusion. 2. Patchy ground-glass opacities scattered throughout the periphery of the RIGHT lung. Differential includes atypical pneumonias such as viral or fungal, interstitial pneumonias, edema related to volume overload/CHF, chronic interstitial diseases, hypersensitivity  pneumonitis, and respiratory bronchiolitis. Favor chronic interstitial lung disease/fibrosis. 3. Borderline pathologic by size criteria lymph nodes within the bilateral hilar regions and aortopulmonary window region of the mediastinum, possibly reactive in nature. Recommend follow-up chest CT in 3-6 months to ensure stability or resolution. 4. Aortic atherosclerosis. 5. 5 mm pulmonary nodule within the RIGHT lung apex and 7 mm pulmonary nodule within the RIGHT middle lobe. Non-contrast chest CT at 3-6 months is recommended. If the nodules are stable at time of repeat CT, then future CT at 18-24 months (from today's scan) is considered optional for low-risk patients, but is recommended for high-risk patients. This recommendation follows the consensus statement: Guidelines for Management of Incidental Pulmonary Nodules Detected on CT Images: From the Fleischner Society 2017; Radiology 2017; 284:228-243. 6. Atrophic LEFT kidney. Aortic Atherosclerosis (ICD10-I70.0) and Emphysema (ICD10-J43.9). Electronically Signed   By: Franki Cabot M.D.   On: 05/28/2019 10:11      Assessment  and Plan: Patient Active Problem List   Diagnosis Date Noted  . Encounter for general adult medical examination with abnormal findings 04/22/2018  . Tick bite of abdominal wall 04/22/2018  . Candida rash of groin 04/22/2018    1. Pleural effusion, not elsewhere classified Patient to have IR guided thoracentesis with fluid aspirate the pleural space. - IR THORACENTESIS ASP PLEURAL SPACE W/IMG GUIDE; Future  2. Smoker Smoking cessation counseling: 1. Pt acknowledges the risks of long term smoking, she will try to quite smoking. 2. Options for different medications including nicotine products, chewing gum, patch etc, Wellbutrin and Chantix is discussed 3. Goal and date of compete cessation is discussed 4. Total time spent in smoking cessation is 15 min.  3. Weight loss Patient continues to lose weight however it appears to be slowing at this time.  Continue to monitor.  4. Early satiety Will try small dose and quantity of Reglan for patient's early satiety until evaluation by GI can be done. - metoCLOPramide (REGLAN) 5 MG tablet; Take 1 tablet (5 mg total) by mouth 2 (two) times daily before a meal.  Dispense: 15 tablet; Refill: 0  General Counseling: I have discussed the findings of the evaluation and examination with Nicole Kindred.  I have also discussed any further diagnostic evaluation thatmay be needed or ordered today. Kobee verbalizes understanding of the findings of todays visit. We also reviewed his medications today and discussed drug interactions and side effects including but not limited excessive drowsiness and altered mental states. We also discussed that there is always a risk not just to him but also people around him. he has been encouraged to call the office with any questions or concerns that should arise related to todays visit.    Time spent: 25 This patient was seen by Orson Gear AGNP-C in Collaboration with Dr. Devona Konig as a part of collaborative care agreement.   I  have personally obtained a history, examined the patient, evaluated laboratory and imaging results, formulated the assessment and plan and placed orders.    Allyne Gee, MD [facility] Pulmonary and Critical Care Sleep medicine

## 2019-06-17 ENCOUNTER — Telehealth: Payer: Self-pay

## 2019-06-17 ENCOUNTER — Other Ambulatory Visit: Payer: Self-pay | Admitting: Internal Medicine

## 2019-06-17 ENCOUNTER — Other Ambulatory Visit: Payer: Self-pay

## 2019-06-17 DIAGNOSIS — Z01818 Encounter for other preprocedural examination: Secondary | ICD-10-CM

## 2019-06-17 NOTE — Telephone Encounter (Signed)
[facility] CALLED TO SEE IF PT OKAY TO BE SEDATED FOR A PROCEDURE AND I GOT THE VERBAL OK FROM [facility] AND CALLED [facility] BACK ADVISING THEM THAT IT IS OKAY FOR PT TO BE SEDATED.

## 2019-06-17 NOTE — Progress Notes (Signed)
covid test sent for u/s clearance

## 2019-06-18 ENCOUNTER — Other Ambulatory Visit
Admission: RE | Admit: 2019-06-18 | Discharge: 2019-06-18 | Disposition: A | Discharge status: Home / Self Care | Payer: Medicare Other | Source: Ambulatory Visit | Attending: Internal Medicine | Admitting: Internal Medicine

## 2019-06-18 ENCOUNTER — Other Ambulatory Visit: Payer: Self-pay

## 2019-06-18 DIAGNOSIS — Z20828 Contact with and (suspected) exposure to other viral communicable diseases: Secondary | ICD-10-CM | POA: Insufficient documentation

## 2019-06-18 DIAGNOSIS — Z01812 Encounter for preprocedural laboratory examination: Secondary | ICD-10-CM | POA: Diagnosis present

## 2019-06-18 LAB — SARS CORONAVIRUS 2 (TAT 6-24 HRS): SARS Coronavirus 2: NEGATIVE

## 2019-06-19 ENCOUNTER — Other Ambulatory Visit: Payer: Self-pay | Admitting: Adult Health

## 2019-06-19 ENCOUNTER — Emergency Department
Admission: EM | Admit: 2019-06-19 | Discharge: 2019-06-19 | Disposition: A | Discharge status: Home / Self Care | Payer: Medicare Other | Attending: Emergency Medicine | Admitting: Emergency Medicine

## 2019-06-19 ENCOUNTER — Other Ambulatory Visit: Payer: Self-pay

## 2019-06-19 ENCOUNTER — Ambulatory Visit
Admission: RE | Admit: 2019-06-19 | Discharge: 2019-06-19 | Disposition: A | Discharge status: Home / Self Care | Payer: Medicare Other | Source: Ambulatory Visit | Attending: Adult Health | Admitting: Adult Health

## 2019-06-19 DIAGNOSIS — F17210 Nicotine dependence, cigarettes, uncomplicated: Secondary | ICD-10-CM | POA: Diagnosis not present

## 2019-06-19 DIAGNOSIS — I10 Essential (primary) hypertension: Secondary | ICD-10-CM | POA: Insufficient documentation

## 2019-06-19 DIAGNOSIS — R55 Syncope and collapse: Secondary | ICD-10-CM | POA: Diagnosis not present

## 2019-06-19 DIAGNOSIS — E876 Hypokalemia: Secondary | ICD-10-CM | POA: Insufficient documentation

## 2019-06-19 DIAGNOSIS — R001 Bradycardia, unspecified: Secondary | ICD-10-CM | POA: Diagnosis present

## 2019-06-19 DIAGNOSIS — J90 Pleural effusion, not elsewhere classified: Secondary | ICD-10-CM

## 2019-06-19 LAB — COMPREHENSIVE METABOLIC PANEL
ALT: 10 U/L (ref 0–44)
AST: 16 U/L (ref 15–41)
Albumin: 3.2 g/dL — ABNORMAL LOW (ref 3.5–5.0)
Alkaline Phosphatase: 73 U/L (ref 38–126)
Anion gap: 10 (ref 5–15)
BUN: 13 mg/dL (ref 6–20)
CO2: 24 mmol/L (ref 22–32)
Calcium: 8.7 mg/dL — ABNORMAL LOW (ref 8.9–10.3)
Chloride: 107 mmol/L (ref 98–111)
Creatinine, Ser: 0.84 mg/dL (ref 0.61–1.24)
GFR calc Af Amer: >60 mL/min (ref >60)
GFR calc non Af Amer: >60 mL/min (ref >60)
Glucose, Bld: 93 mg/dL (ref 70–99)
Potassium: 2.8 mmol/L — ABNORMAL LOW (ref 3.5–5.1)
Sodium: 141 mmol/L (ref 135–145)
Total Bilirubin: 0.7 mg/dL (ref 0.3–1.2)
Total Protein: 6.2 g/dL — ABNORMAL LOW (ref 6.5–8.1)

## 2019-06-19 LAB — CBC WITH DIFFERENTIAL/PLATELET
Abs Immature Granulocytes: 0.02 10*3/uL (ref 0.00–0.07)
Basophils Absolute: 0.1 10*3/uL (ref 0.0–0.1)
Basophils Relative: 2 %
Eosinophils Absolute: 0.2 10*3/uL (ref 0.0–0.5)
Eosinophils Relative: 2 %
HCT: 42.4 % (ref 39.0–52.0)
Hemoglobin: 14.6 g/dL (ref 13.0–17.0)
Immature Granulocytes: 0 %
Lymphocytes Relative: 47 %
Lymphs Abs: 4 10*3/uL (ref 0.7–4.0)
MCH: 30.8 pg (ref 26.0–34.0)
MCHC: 34.4 g/dL (ref 30.0–36.0)
MCV: 89.5 fL (ref 80.0–100.0)
Monocytes Absolute: 0.4 10*3/uL (ref 0.1–1.0)
Monocytes Relative: 5 %
Neutro Abs: 3.7 10*3/uL (ref 1.7–7.7)
Neutrophils Relative %: 44 %
Platelets: 220 10*3/uL (ref 150–400)
RBC: 4.74 MIL/uL (ref 4.22–5.81)
RDW: 13.2 % (ref 11.5–15.5)
WBC: 8.4 10*3/uL (ref 4.0–10.5)
nRBC: 0 % (ref 0.0–0.2)

## 2019-06-19 LAB — TROPONIN I (HIGH SENSITIVITY)
Troponin I (High Sensitivity): 11 ng/L (ref <18)
Troponin I (High Sensitivity): 15 ng/L (ref <18)

## 2019-06-19 LAB — MAGNESIUM: Magnesium: 1.9 mg/dL (ref 1.7–2.4)

## 2019-06-19 LAB — T4, FREE: Free T4: 0.93 ng/dL (ref 0.61–1.12)

## 2019-06-19 LAB — TSH: TSH: 0.272 u[IU]/mL — ABNORMAL LOW (ref 0.350–4.500)

## 2019-06-19 MED ORDER — POTASSIUM CHLORIDE CRYS ER 20 MEQ PO TBCR
40.0000 meq | EXTENDED_RELEASE_TABLET | Freq: Once | ORAL | Status: AC
  Administered 2019-06-19: 17:00:00 40 meq via ORAL
  Filled 2019-06-19: qty 2

## 2019-06-19 MED ORDER — POTASSIUM CHLORIDE 10 MEQ/100ML IV SOLN
10.0000 meq | INTRAVENOUS | Status: AC
  Administered 2019-06-19: 17:00:00 10 meq via INTRAVENOUS
  Filled 2019-06-19 (×2): qty 100

## 2019-06-19 MED ORDER — POTASSIUM CHLORIDE ER 10 MEQ PO TBCR: 20.0000 meq | EXTENDED_RELEASE_TABLET | Freq: Every day | ORAL | 0 refills | Status: DC

## 2019-06-19 NOTE — ED Provider Notes (Signed)
[facility] Emergency Department Provider Note  ____________________________________________   First MD Initiated Contact with Patient 06/19/19 1528     (approximate)  I have reviewed the triage vital signs and the nursing notes.   HISTORY  Chief Complaint Bradycardia    HPI Jose Cuevas is a 58 y.o. male with rheumatoid arthritis who is on Humira who presents with low heart rate.  Patient was found to have a left pleural effusion is moderate in size.  He came in today for a ultrasound of his chest to get a thoracentesis.  He said that he felt some pain as if he thought the thoracentesis was starting when then all of a sudden he felt clammy, lightheaded, hot.  The onset of this was just prior to arrival, occurred one time, came on due to feeling of pain in his chest where there doing the thoracentesis, got better on its own.  They noted that his heart rate went down into the 30s.  He did not lose consciousness or hit his head but felt like he was going to pass out.  After some time he came back and was at his normal baseline self.  He was transported to the ER for further evaluation. Denies chest pain.    Review of records show that patient's heart rate typically runs in the 50s to 60s.     Past Medical History:  Diagnosis Date  . Collagen vascular disease (HCC)    Rhematoid Arthritis hx.  . Hyperlipidemia   . Hypertension   . Low kidney function   . Peripheral blood vessel disorder ([place])   . Rheumatoid arthritis with rheumatoid factor (HCC)   . Sleep apnea     Patient Active Problem List   Diagnosis Date Noted  . Encounter for general adult medical examination with abnormal findings 04/22/2018  . Tick bite of abdominal wall 04/22/2018  . Candida rash of groin 04/22/2018    Past Surgical History:  Procedure Laterality Date  . BACK SURGERY    . COLONOSCOPY WITH PROPOFOL N/A 11/11/2015   Procedure: COLONOSCOPY WITH PROPOFOL;  Surgeon: [name], MD;  Location: [facility] ENDOSCOPY;  Service: Endoscopy;  Laterality: N/A;  . COLONOSCOPY WITH PROPOFOL N/A 12/02/2015   Procedure: COLONOSCOPY WITH PROPOFOL;  Surgeon: [name], MD;  Location: [facility] ENDOSCOPY;  Service: Endoscopy;  Laterality: N/A;  . ESOPHAGOGASTRODUODENOSCOPY (EGD) WITH PROPOFOL N/A 11/11/2015   Procedure: ESOPHAGOGASTRODUODENOSCOPY (EGD) WITH PROPOFOL;  Surgeon: [name], MD;  Location: [facility] ENDOSCOPY;  Service: Endoscopy;  Laterality: N/A;  . ESOPHAGOGASTRODUODENOSCOPY (EGD) WITH PROPOFOL N/A 12/02/2015   Procedure: ESOPHAGOGASTRODUODENOSCOPY (EGD) WITH PROPOFOL;  Surgeon: [name], MD;  Location: [facility] ENDOSCOPY;  Service: Endoscopy;  Laterality: N/A;  . ESOPHAGOGASTRODUODENOSCOPY (EGD) WITH PROPOFOL N/A 01/20/2016   Procedure: ESOPHAGOGASTRODUODENOSCOPY (EGD) WITH PROPOFOL;  Surgeon: [name], MD;  Location: [facility] ENDOSCOPY;  Service: Endoscopy;  Laterality: N/A;  . KNEE RECONSTRUCTION Left     Prior to Admission medications   Medication Sig Start Date End Date Taking? Authorizing Provider  Adalimumab (HUMIRA PEN) 40 MG/0.4ML PNKT Inject into the skin. Every other week    [provider]  Aspirin-Caffeine (BC FAST PAIN RELIEF ARTHRITIS PO) Take by mouth.    [provider]  esomeprazole (NEXIUM) 40 MG capsule Take 40 mg by mouth daily at 12 noon.    [provider]  folic acid (FOLVITE) 1 MG tablet Take 1 mg by mouth daily.    [provider]  ketoconazole (NIZORAL) 2 % cream Apply 1 application topically 2 (two) times daily. 04/17/18   Kendell Bane, NP  methadone (DOLOPHINE) 10 MG tablet Take 10 mg by mouth every 8 (eight) hours.    [provider]  methotrexate 50 MG/2ML injection Inject 50 mg/m2 into the vein once. 0.74mls once weekly    [provider]  metoCLOPramide (REGLAN) 5 MG tablet Take 1 tablet (5 mg total) by mouth 2 (two) times daily before a meal. 06/16/19    Scarboro, Audie Clear, NP  omeprazole (PRILOSEC) 40 MG capsule Take 1 capsule (40 mg total) by mouth 2 (two) times daily at 10 AM and 5 PM. 05/11/19   Scarboro, Audie Clear, NP  vitamin B-12 (CYANOCOBALAMIN) 1000 MCG tablet Take 1,000 mcg by mouth daily.    [provider]    Allergies Patient has no known allergies.  Family History  Problem Relation Age of Onset  . Diabetes Mother   . Diabetes Father     Social History Social History   Tobacco Use  . Smoking status: Current Some Day Smoker    Types: Cigars  . Smokeless tobacco: Former Network engineer Use Topics  . Alcohol use: No  . Drug use: No      Review of Systems Constitutional: No fever/chills, positive lightheadedness, near syncope Eyes: No visual changes. ENT: No sore throat. Cardiovascular: Denies chest pain. Respiratory: Denies shortness of breath. Gastrointestinal: No abdominal pain.  No nausea, no vomiting.  No diarrhea.  No constipation. Genitourinary: Negative for dysuria. Musculoskeletal: Negative for back pain. Skin: Negative for rash. Neurological: Negative for headaches, focal weakness or numbness. All other ROS negative ____________________________________________   PHYSICAL EXAM:  VITAL SIGNS: Blood pressure (!) 158/94, pulse (!) 54, temperature 98 F (36.7 C), temperature source Oral, resp. rate 18, height 5\' 7"  (1.702 m), weight 59.4 kg, SpO2 99 %.  Constitutional: Alert and oriented. Well appearing and in no acute distress. Eyes: Conjunctivae are normal. EOMI. Head: Atraumatic. Nose: No congestion/rhinnorhea. Mouth/Throat: Mucous membranes are moist.   Neck: No stridor. Trachea Midline. FROM Cardiovascular: Normal rate, regular rhythm. Grossly normal heart sounds.  Good peripheral circulation. Respiratory: Normal respiratory effort.  No retractions.  Crackles on the left Gastrointestinal: Soft and nontender. No distention. No abdominal bruits.  Musculoskeletal: No lower extremity  tenderness nor edema.  No joint effusions. Neurologic:  Normal speech and language. No gross focal neurologic deficits are appreciated.  Skin:  Skin is warm, dry and intact. No rash noted. Psychiatric: Mood and affect are normal. Speech and behavior are normal. GU: Deferred   ____________________________________________   LABS (all labs ordered are listed, but only abnormal results are displayed)  Labs Reviewed  COMPREHENSIVE METABOLIC PANEL - Abnormal; Notable for the following components:      Result Value   Potassium 2.8 (*)    Calcium 8.7 (*)    Total Protein 6.2 (*)    Albumin 3.2 (*)    All other components within normal limits  TSH - Abnormal; Notable for the following components:   TSH 0.272 (*)    All other components within normal limits  CBC WITH DIFFERENTIAL/PLATELET  MAGNESIUM  T4, FREE  TROPONIN I (HIGH SENSITIVITY)  TROPONIN I (HIGH SENSITIVITY)   ____________________________________________   ED ECG REPORT I, Vanessa Millbrook, the attending physician, personally viewed and interpreted this ECG.  EKG sinus bradycardia rate of 53, no ST elevation, T wave inversions in V1 through V5, normal intervals, normal intervals.  ____________________________________________  PROCEDURES  Procedure(s) performed (including Critical Care):  Procedures   ____________________________________________   INITIAL IMPRESSION / ASSESSMENT AND PLAN / ED COURSE  Jose Cuevas was evaluated in Emergency Department on 06/19/2019 for the symptoms described in the history of present illness. He was evaluated in the context of the global COVID-19 pandemic, which necessitated consideration that the patient might be at risk for infection with the SARS-CoV-2 virus that causes COVID-19. Institutional protocols and algorithms that pertain to the evaluation of patients at risk for COVID-19 are in a state of rapid change based on information released by regulatory bodies including the CDC and  federal and state organizations. These policies and algorithms were followed during the patient's care in the ED.    Patient is a well-appearing 58 year old who present with episode of bradycardia in the setting of getting a thoracentesis.  This is most likely secondary to vasovagal.  However patient EKG has somewhat of a Wellens appearance therefore will get cardiac markers to further evaluate.  No other evidence of arrhythmia.  Oxygen levels are normal and they had not started the procedure so low suspicion for pneumothorax.  Will get labs to evaluate for electrolyte abnormalities versus hypothyroidism.  Labs were notable for low potassium at 2.8.  Magnesium was normal.  Troponin was 11.  Will replete K with 20 IV K and 40 PO K.   D/w Dr. Rockey Situ.  Recommended replete K and repeat EKG, and repeat trop. Given no symptoms more likely LVH with strain rather then wellens.  He reviewed CT scan and there is minimal plaque in the coronary a. But does have thick LV making strain pattern more likely then wellens.   5:59 PM patient ambulated without any symptoms.  Repeat EKG looks similar to prior.  No evolution.  It is sinus rate of 53.  Prolonged QTC but it is less than half the RR interval.  No ST elevation, similar T wave version.   Trop  11-15   Rediscussed with Dr. Rockey Situ updated on the results.  He feels comfortable patient going home and following up in cardiology clinic given patient remains asymptomatic.  We will give patient a few doses of additional potassium given he has a colonoscopy planned and I suspect that he will lose more potassium from this.  Discussed with the family they feel comfortable with this plan.  ____________________________________________   FINAL CLINICAL IMPRESSION(S) / ED DIAGNOSES   Final diagnoses:  Vaso vagal episode  Hypokalemia      MEDICATIONS GIVEN DURING THIS VISIT:  Medications  potassium chloride 10 mEq in 100 mL IVPB (10 mEq Intravenous New  Bag/Given 06/19/19 1719)  potassium chloride SA (K-DUR) CR tablet 40 mEq (40 mEq Oral Given 06/19/19 1712)     ED Discharge Orders         Ordered    potassium chloride (K-DUR) 10 MEQ tablet  Daily     06/19/19 2005           Note:  This document was prepared using Dragon voice recognition software and may include unintentional dictation errors.   Vanessa Bushnell, MD 06/19/19 2006

## 2019-06-19 NOTE — ED Triage Notes (Signed)
Pt comes with c/o bradycardia. Pt was here at hospital for thoracentisis. Per Lauren at [place] pt became sweaty. [place] stated pt's HR was in low 30s.   Pt denies any CP. Pt states some pain in abdomen. Pt does state he had some fluid in or around lungs and was here for the removal of that.  Pt arrives A&O. Pt states that he became hot and lightheaded.   MD at bedside, RN [place] at bedside

## 2019-06-19 NOTE — Discharge Instructions (Addendum)
Your work-up was reassuring except for some low potassium.  We are giving you some oral potassium for the next few days.  You should have this rechecked within a week.  Return to the ER for any other concerns.  Otherwise she should follow with cardiology for the changes on her EKG.

## 2019-06-19 NOTE — Procedures (Signed)
[place], [address]  DATE OF SERVICE: June 10, 2019  Complete Pulmonary Function Testing Interpretation:  FINDINGS:  The forced vital capacity is normal.  The FEV1 is normal.  FEV1 FVC ratio is normal.  Postbronchodilator there is no significant change in the FEV1 however clinical improvement may occur in the absence of spirometric improvement.  Total lung capacity is normal residual volume is normal residual anti-lung capacity ratio is normal thoracic gas volume is increased.  DLCO is mildly decreased.  DLCO corrected for alveolar volume is normal.  IMPRESSION:  This pulmonary function study is consistent with normal spirometry and normal lung volumes.  The patient does have a mild decrease in DLCO however when corrected it is normal also.  Clinical correlation is recommended.  Allyne Gee, MD [facility] Pulmonary Critical Care Medicine Sleep Medicine

## 2019-06-22 ENCOUNTER — Ambulatory Visit
Admission: RE | Admit: 2019-06-22 | Payer: BC Managed Care – PPO | Source: Home / Self Care | Admitting: Internal Medicine

## 2019-06-22 ENCOUNTER — Encounter: Admission: RE | Payer: Self-pay | Source: Home / Self Care

## 2019-06-22 SURGERY — ESOPHAGOGASTRODUODENOSCOPY (EGD) WITH PROPOFOL
Anesthesia: General

## 2019-06-24 ENCOUNTER — Telehealth: Payer: Self-pay

## 2019-06-24 NOTE — Telephone Encounter (Signed)
L MOM to schedule NP appt

## 2019-06-24 NOTE — Telephone Encounter (Signed)
-----   Message from Minna Merritts, MD sent at 06/19/2019  5:01 PM EDT ----- Regarding: er Seen in the emergency room for bradycardia, vasovagal episode New patient any provider Thx TG

## 2019-06-29 ENCOUNTER — Other Ambulatory Visit: Payer: Self-pay | Admitting: Internal Medicine

## 2019-06-29 ENCOUNTER — Other Ambulatory Visit: Payer: Self-pay

## 2019-06-29 ENCOUNTER — Encounter: Payer: Self-pay | Admitting: Cardiology

## 2019-06-29 ENCOUNTER — Ambulatory Visit (INDEPENDENT_AMBULATORY_CARE_PROVIDER_SITE_OTHER): Payer: Medicare Other | Admitting: Cardiology

## 2019-06-29 VITALS — BP 150/81 | HR 65 | Ht 67.0 in | Wt 124.5 lb

## 2019-06-29 DIAGNOSIS — F17200 Nicotine dependence, unspecified, uncomplicated: Secondary | ICD-10-CM | POA: Diagnosis not present

## 2019-06-29 DIAGNOSIS — R55 Syncope and collapse: Secondary | ICD-10-CM | POA: Diagnosis not present

## 2019-06-29 DIAGNOSIS — I517 Cardiomegaly: Secondary | ICD-10-CM

## 2019-06-29 DIAGNOSIS — I447 Left bundle-branch block, unspecified: Secondary | ICD-10-CM

## 2019-06-29 DIAGNOSIS — J90 Pleural effusion, not elsewhere classified: Secondary | ICD-10-CM

## 2019-06-29 NOTE — Progress Notes (Signed)
Cardiology Office Note:    Date:  06/29/2019   ID:  Jose Cuevas, DOB December 02, 1960, MRN [id number]  PCP:  Allyne Gee, MD  Cardiologist:  Kate Sable, MD  Electrophysiologist:  None   Referring MD: Allyne Gee, MD   Chief Complaint  Patient presents with  . other    Bradycardia and vasovagal episode c/o back/rib cage pain. Meds reviewed verbally with pt.    History of Present Illness:    Jose Cuevas is a 58 y.o. male with a hx of 40+ years of smoking, left pleural effusion who presents after a syncopal episode and bradycardia.  Patient has a long history of abdominal pain, abdominal imaging to evaluate the pain did reveal a left pleural effusion.  10 days ago, patient presented to the outpatient center for a left thoracentesis due to smoking history and left pleural effusion.  Thoracentesis was attempted but patient was in too much pain.  He states having symptoms of dizziness, and his heart rate was reportedly low today 30s.  The procedure was stopped, his heart rate and blood pressure were monitored, and they improved.  Patient was then asked if he wants to try the procedure again which he agreed.  The procedure was attempted again, with similar symptoms of dizziness, feeling of passing out, and low heart rate.  Patient was then taken to the ED for evaluation.  In the ED he was noted to be hypokalemic.  His potassium was repleted with IV potassium.  He denies ever having such episodes before and has not had any episodes since.  Past Medical History:  Diagnosis Date  . Collagen vascular disease (HCC)    Rhematoid Arthritis hx.  . DDD (degenerative disc disease), cervical   . Hyperlipidemia   . Hypertension   . Low kidney function   . Peripheral blood vessel disorder ([place])   . Rheumatoid arthritis with rheumatoid factor (HCC)   . Sleep apnea     Past Surgical History:  Procedure Laterality Date  . BACK SURGERY    . COLONOSCOPY WITH PROPOFOL N/A 11/11/2015   Procedure:  COLONOSCOPY WITH PROPOFOL;  Surgeon: [name], MD;  Location: [facility] ENDOSCOPY;  Service: Endoscopy;  Laterality: N/A;  . COLONOSCOPY WITH PROPOFOL N/A 12/02/2015   Procedure: COLONOSCOPY WITH PROPOFOL;  Surgeon: [name], MD;  Location: [facility] ENDOSCOPY;  Service: Endoscopy;  Laterality: N/A;  . ESOPHAGOGASTRODUODENOSCOPY (EGD) WITH PROPOFOL N/A 11/11/2015   Procedure: ESOPHAGOGASTRODUODENOSCOPY (EGD) WITH PROPOFOL;  Surgeon: [name], MD;  Location: [facility] ENDOSCOPY;  Service: Endoscopy;  Laterality: N/A;  . ESOPHAGOGASTRODUODENOSCOPY (EGD) WITH PROPOFOL N/A 12/02/2015   Procedure: ESOPHAGOGASTRODUODENOSCOPY (EGD) WITH PROPOFOL;  Surgeon: [name], MD;  Location: [facility] ENDOSCOPY;  Service: Endoscopy;  Laterality: N/A;  . ESOPHAGOGASTRODUODENOSCOPY (EGD) WITH PROPOFOL N/A 01/20/2016   Procedure: ESOPHAGOGASTRODUODENOSCOPY (EGD) WITH PROPOFOL;  Surgeon: [name], MD;  Location: [facility] ENDOSCOPY;  Service: Endoscopy;  Laterality: N/A;  . KNEE RECONSTRUCTION Left     Current Medications: Current Meds  Medication Sig  . Adalimumab (HUMIRA PEN) 40 MG/0.4ML PNKT Inject into the skin. Every other week  . Aspirin-Caffeine (BC FAST PAIN RELIEF ARTHRITIS PO) Take by mouth.  . esomeprazole (NEXIUM) 40 MG capsule Take 40 mg by mouth daily at 12 noon.  . folic acid (FOLVITE) 1 MG tablet Take 1 mg by mouth daily.  Marland Kitchen ketoconazole (NIZORAL) 2 % cream Apply 1 application topically 2 (two) times daily.  Marland Kitchen LINZESS 290 MCG CAPS capsule Take  290 mcg by mouth daily before breakfast.  . methadone (DOLOPHINE) 10 MG tablet Take 10 mg by mouth 4 (four) times daily.   . methotrexate 50 MG/2ML injection Inject 50 mg/m2 into the vein once. 0.4mls once weekly  . sucralfate (CARAFATE) 1 g tablet Take 1 g by mouth 3 (three) times daily.  . vitamin B-12 (CYANOCOBALAMIN) 1000 MCG tablet Take 1,000 mcg by mouth daily.     Allergies:   Patient has no known allergies.   Social  History   Socioeconomic History  . Marital status: Married    Spouse name: Not on file  . Number of children: Not on file  . Years of education: Not on file  . Highest education level: Not on file  Occupational History  . Not on file  Social Needs  . Financial resource strain: Not on file  . Food insecurity    Worry: Not on file    Inability: Not on file  . Transportation needs    Medical: Not on file    Non-medical: Not on file  Tobacco Use  . Smoking status: Current Some Day Smoker    Years: 2.00    Types: Cigars  . Smokeless tobacco: Former Systems developer  . Tobacco comment: Occasional   Substance and Sexual Activity  . Alcohol use: No  . Drug use: No  . Sexual activity: Not on file  Lifestyle  . Physical activity    Days per week: Not on file    Minutes per session: Not on file  . Stress: Not on file  Relationships  . Social Herbalist on phone: Not on file    Gets together: Not on file    Attends religious service: Not on file    Active member of club or organization: Not on file    Attends meetings of clubs or organizations: Not on file    Relationship status: Not on file  Other Topics Concern  . Not on file  Social History Narrative  . Not on file     Family History: The patient's family history includes Diabetes in his father and mother; Heart attack in his maternal grandmother.  ROS:   Please see the history of present illness.     All other systems reviewed and are negative.  EKGs/Labs/Other Studies Reviewed:    The following studies were reviewed today:   EKG:  EKG is  ordered today.  The ekg ordered today demonstrates normal sinus rhythm, left bundle branch block, LVH per voltage criteria.  Recent Labs: 06/19/2019: ALT 10; BUN 13; Creatinine, Ser 0.84; Hemoglobin 14.6; Magnesium 1.9; Platelets 220; Potassium 2.8; Sodium 141; TSH 0.272  Recent Lipid Panel    Component Value Date/Time   CHOL 171 04/21/2019 0945   CHOL 127 04/17/2018 1512    CHOL 211 (H) 10/15/2013 0910   TRIG 93 04/21/2019 0945   TRIG 184 10/15/2013 0910   HDL 33 (L) 04/21/2019 0945   HDL 23 (L) 04/17/2018 1512   HDL 28 (L) 10/15/2013 0910   CHOLHDL 5.2 04/21/2019 0945   VLDL 19 04/21/2019 0945   VLDL 37 10/15/2013 0910   LDLCALC 119 (H) 04/21/2019 0945   LDLCALC 63 04/17/2018 1512   LDLCALC 146 (H) 10/15/2013 0910    Physical Exam:    VS:  BP (!) 150/81 (BP Location: Right Arm, Patient Position: Sitting, Cuff Size: Normal)   Pulse 65   Ht 5\' 7"  (1.702 m)   Wt 124 lb 8 oz (56.5  kg)   SpO2 98%   BMI 19.50 kg/m     Wt Readings from Last 3 Encounters:  06/29/19 124 lb 8 oz (56.5 kg)  06/19/19 131 lb (59.4 kg)  06/16/19 131 lb (59.4 kg)     GEN: Patient appears cachectic, well developed in no acute distress HEENT: Normal NECK: No JVD; No carotid bruits LYMPHATICS: No lymphadenopathy CARDIAC: RRR, no murmurs, rubs, gallops RESPIRATORY: Decreased breath sounds at left lung base, crackles at left lung base. ABDOMEN: Soft, non-tender, non-distended MUSCULOSKELETAL:  No edema; No deformity  SKIN: Warm and dry NEUROLOGIC:  Alert and oriented x 3 PSYCHIATRIC:  Normal affect   ASSESSMENT:   Patient symptoms are consistent with vasovagal syncope with associated cardioinhibitory response.  ECG shows LVH and left bundle branch block.  Orthostatic vitals measured in the office did not reveal any evidence for orthostasis  1. Vasovagal near-syncope   2. LBBB (left bundle branch block)   3. LVH (left ventricular hypertrophy)   4. Smoker    PLAN:    In order of problems listed above:  1. He is near syncope is likely due to vasovagal response secondary to pain.  Recommend good pain management prior to attempted thoracentesis to prevent another vasovagal response. 2. Left bundle branch block and LVH noted on ECG.  Will get echocardiogram. 3. The patient was counseled on tobacco cessation today for 15 minutes.  Counseling included reviewing the risks of  smoking tobacco products, how it impacts the patient's current medical diagnoses and different strategies for quitting.  Pharmacotherapy to aid in tobacco cessation was not prescribed today.  Follow-up after echo   Medication Adjustments/Labs and Tests Ordered: Current medicines are reviewed at length with the patient today.  Concerns regarding medicines are outlined above.  Orders Placed This Encounter  Procedures  . EKG 12-Lead  . ECHOCARDIOGRAM COMPLETE   No orders of the defined types were placed in this encounter.   Patient Instructions  Medication Instructions:  Your physician recommends that you continue on your current medications as directed. Please refer to the Current Medication list given to you today.  If you need a refill on your cardiac medications before your next appointment, please call your pharmacy.   Lab work: None ordered If you have labs (blood work) drawn today and your tests are completely normal, you will receive your results only by: Marland Kitchen MyChart Message (if you have MyChart) OR . A paper copy in the mail If you have any lab test that is abnormal or we need to change your treatment, we will call you to review the results.  Testing/Procedures: Your physician has requested that you have an echocardiogram. Echocardiography is a painless test that uses sound waves to create images of your heart. It provides your doctor with information about the size and shape of your heart and how well your heart's chambers and valves are working. This procedure takes approximately one hour. There are no restrictions for this procedure.    Follow-Up: At [facility], you and your health needs are our priority.  As part of our continuing mission to provide you with exceptional heart care, we have created designated Provider Care Teams.  These Care Teams include your primary Cardiologist (physician) and Advanced Practice Providers (APPs -  Physician Assistants and Nurse  Practitioners) who all work together to provide you with the care you need, when you need it. You will need a follow up appointment in 6 weeks.    You may see Aaron Edelman  Agbor-Etang, MD  or one of the following Advanced Practice Providers on your designated Care Team:   Murray Hodgkins, NP Christell Faith, PA-C . Marrianne Mood, PA-C  Any Other Special Instructions Will Be Listed Below (If Applicable). N/A      Signed, Kate Sable, MD  06/29/2019 4:02 PM     [facility]

## 2019-06-29 NOTE — Patient Instructions (Signed)
Medication Instructions:  Your physician recommends that you continue on your current medications as directed. Please refer to the Current Medication list given to you today.  If you need a refill on your cardiac medications before your next appointment, please call your pharmacy.   Lab work: None ordered If you have labs (blood work) drawn today and your tests are completely normal, you will receive your results only by: Marland Kitchen MyChart Message (if you have MyChart) OR . A paper copy in the mail If you have any lab test that is abnormal or we need to change your treatment, we will call you to review the results.  Testing/Procedures: Your physician has requested that you have an echocardiogram. Echocardiography is a painless test that uses sound waves to create images of your heart. It provides your doctor with information about the size and shape of your heart and how well your heart's chambers and valves are working. This procedure takes approximately one hour. There are no restrictions for this procedure.    Follow-Up: At [facility], you and your health needs are our priority.  As part of our continuing mission to provide you with exceptional heart care, we have created designated Provider Care Teams.  These Care Teams include your primary Cardiologist (physician) and Advanced Practice Providers (APPs -  Physician Assistants and Nurse Practitioners) who all work together to provide you with the care you need, when you need it. You will need a follow up appointment in 6 weeks.    You may see Kate Sable, MD  or one of the following Advanced Practice Providers on your designated Care Team:   Murray Hodgkins, NP Christell Faith, PA-C . Marrianne Mood, PA-C  Any Other Special Instructions Will Be Listed Below (If Applicable). N/A

## 2019-06-30 ENCOUNTER — Other Ambulatory Visit
Admission: RE | Admit: 2019-06-30 | Discharge: 2019-06-30 | Disposition: A | Discharge status: Home / Self Care | Payer: Medicare Other | Source: Ambulatory Visit | Attending: Internal Medicine | Admitting: Internal Medicine

## 2019-06-30 ENCOUNTER — Ambulatory Visit: Payer: Medicare Other | Admitting: Internal Medicine

## 2019-06-30 DIAGNOSIS — Z20828 Contact with and (suspected) exposure to other viral communicable diseases: Secondary | ICD-10-CM | POA: Insufficient documentation

## 2019-06-30 DIAGNOSIS — Z01812 Encounter for preprocedural laboratory examination: Secondary | ICD-10-CM | POA: Diagnosis not present

## 2019-06-30 LAB — SARS CORONAVIRUS 2 (TAT 6-24 HRS): SARS Coronavirus 2: NEGATIVE

## 2019-07-01 ENCOUNTER — Ambulatory Visit
Admission: RE | Admit: 2019-07-01 | Discharge: 2019-07-01 | Disposition: A | Discharge status: Home / Self Care | Payer: Medicare Other | Source: Ambulatory Visit | Attending: Internal Medicine | Admitting: Internal Medicine

## 2019-07-01 ENCOUNTER — Other Ambulatory Visit: Payer: Self-pay

## 2019-07-01 ENCOUNTER — Ambulatory Visit
Admission: RE | Admit: 2019-07-01 | Discharge: 2019-07-01 | Disposition: A | Discharge status: Home / Self Care | Payer: Medicare Other | Source: Ambulatory Visit | Attending: Interventional Radiology | Admitting: Interventional Radiology

## 2019-07-01 DIAGNOSIS — R0989 Other specified symptoms and signs involving the circulatory and respiratory systems: Secondary | ICD-10-CM | POA: Insufficient documentation

## 2019-07-01 DIAGNOSIS — J90 Pleural effusion, not elsewhere classified: Secondary | ICD-10-CM | POA: Diagnosis not present

## 2019-07-01 DIAGNOSIS — Z9889 Other specified postprocedural states: Secondary | ICD-10-CM | POA: Diagnosis not present

## 2019-07-01 DIAGNOSIS — I700 Atherosclerosis of aorta: Secondary | ICD-10-CM | POA: Diagnosis not present

## 2019-07-01 LAB — AMYLASE, PLEURAL OR PERITONEAL FLUID: Amylase, Fluid: 24 U/L

## 2019-07-01 LAB — PROTEIN, PLEURAL OR PERITONEAL FLUID: Total protein, fluid: 3.8 g/dL

## 2019-07-01 LAB — GLUCOSE, PLEURAL OR PERITONEAL FLUID: Glucose, Fluid: <20 mg/dL

## 2019-07-01 NOTE — Procedures (Signed)
Pre procedural Dx: Symptomatic Pleural effusion Post procedural Dx: Same  Successful US guided left sided thoracentesis yielding 800 cc of serous pleural fluid.   Samples sent to lab for analysis.  EBL: None  Complications: None immediate.  Ronny Bacon, MD Pager #: [phone]

## 2019-07-03 LAB — ACID FAST SMEAR (AFB, MYCOBACTERIA): Acid Fast Smear: NEGATIVE

## 2019-07-03 LAB — CYTOLOGY - NON PAP

## 2019-07-04 LAB — CHOLESTEROL, BODY FLUID: Cholesterol, Fluid: 72 mg/dL

## 2019-07-08 ENCOUNTER — Encounter: Payer: Self-pay | Admitting: Internal Medicine

## 2019-07-08 ENCOUNTER — Other Ambulatory Visit: Payer: Self-pay

## 2019-07-08 ENCOUNTER — Ambulatory Visit (INDEPENDENT_AMBULATORY_CARE_PROVIDER_SITE_OTHER): Payer: Medicare Other | Admitting: Internal Medicine

## 2019-07-08 VITALS — BP 187/74 | HR 63 | Temp 98.2°F | Resp 16 | Ht 67.0 in | Wt 126.0 lb

## 2019-07-08 DIAGNOSIS — R634 Abnormal weight loss: Secondary | ICD-10-CM | POA: Diagnosis not present

## 2019-07-08 DIAGNOSIS — J90 Pleural effusion, not elsewhere classified: Secondary | ICD-10-CM | POA: Diagnosis not present

## 2019-07-08 NOTE — Progress Notes (Signed)
[facility] [place], [place] [address]  Pulmonary Sleep Medicine   Office Visit Note  Patient Name: Jose Cuevas DOB: [birth year] MRN [id number]  Date of Service: 07/08/2019  Complaints/HPI: Pt is here for follow up on Thoracentesis.  His cytology is negative at this time. He continues to feel abdominal discomfort and is unable to eat very much.  The plan is for him to follow up with GI.    ROS  General: (-) fever, (-) chills, (-) night sweats, (-) weakness Skin: (-) rashes, (-) itching,. Eyes: (-) visual changes, (-) redness, (-) itching. Nose and Sinuses: (-) nasal stuffiness or itchiness, (-) postnasal drip, (-) nosebleeds, (-) sinus trouble. Mouth and Throat: (-) sore throat, (-) hoarseness. Neck: (-) swollen glands, (-) enlarged thyroid, (-) neck pain. Respiratory: - cough, (-) bloody sputum, - shortness of breath, - wheezing. Cardiovascular: - ankle swelling, (-) chest pain. Lymphatic: (-) lymph node enlargement. Neurologic: (-) numbness, (-) tingling. Psychiatric: (-) anxiety, (-) depression   Current Medication: Outpatient Encounter Medications as of 07/08/2019  Medication Sig Note  . Adalimumab (HUMIRA PEN) 40 MG/0.4ML PNKT Inject into the skin. Every other week   . Aspirin-Caffeine (BC FAST PAIN RELIEF ARTHRITIS PO) Take by mouth.   . esomeprazole (NEXIUM) 40 MG capsule Take 40 mg by mouth daily at 12 noon.   . folic acid (FOLVITE) 1 MG tablet Take 1 mg by mouth daily.   Marland Kitchen ketoconazole (NIZORAL) 2 % cream Apply 1 application topically 2 (two) times daily.   Marland Kitchen LINZESS 290 MCG CAPS capsule Take 290 mcg by mouth daily before breakfast.   . methadone (DOLOPHINE) 10 MG tablet Take 10 mg by mouth 4 (four) times daily.    . methotrexate 50 MG/2ML injection Inject 50 mg/m2 into the vein once. 0.21mls once weekly 06/19/2019: Wednesday   . sucralfate (CARAFATE) 1 g tablet Take 1 g by mouth 3 (three) times daily.   . vitamin B-12 (CYANOCOBALAMIN) 1000  MCG tablet Take 1,000 mcg by mouth daily.    No facility-administered encounter medications on file as of 07/08/2019.     Surgical History: Past Surgical History:  Procedure Laterality Date  . BACK SURGERY    . COLONOSCOPY WITH PROPOFOL N/A 11/11/2015   Procedure: COLONOSCOPY WITH PROPOFOL;  Surgeon: [name], MD;  Location: [facility] ENDOSCOPY;  Service: Endoscopy;  Laterality: N/A;  . COLONOSCOPY WITH PROPOFOL N/A 12/02/2015   Procedure: COLONOSCOPY WITH PROPOFOL;  Surgeon: [name], MD;  Location: [facility] ENDOSCOPY;  Service: Endoscopy;  Laterality: N/A;  . ESOPHAGOGASTRODUODENOSCOPY (EGD) WITH PROPOFOL N/A 11/11/2015   Procedure: ESOPHAGOGASTRODUODENOSCOPY (EGD) WITH PROPOFOL;  Surgeon: [name], MD;  Location: [facility] ENDOSCOPY;  Service: Endoscopy;  Laterality: N/A;  . ESOPHAGOGASTRODUODENOSCOPY (EGD) WITH PROPOFOL N/A 12/02/2015   Procedure: ESOPHAGOGASTRODUODENOSCOPY (EGD) WITH PROPOFOL;  Surgeon: [name], MD;  Location: [facility] ENDOSCOPY;  Service: Endoscopy;  Laterality: N/A;  . ESOPHAGOGASTRODUODENOSCOPY (EGD) WITH PROPOFOL N/A 01/20/2016   Procedure: ESOPHAGOGASTRODUODENOSCOPY (EGD) WITH PROPOFOL;  Surgeon: [name], MD;  Location: [facility] ENDOSCOPY;  Service: Endoscopy;  Laterality: N/A;  . KNEE RECONSTRUCTION Left     Medical History: Past Medical History:  Diagnosis Date  . Collagen vascular disease (HCC)    Rhematoid Arthritis hx.  . DDD (degenerative disc disease), cervical   . Hyperlipidemia   . Hypertension   . Low kidney function   . Peripheral blood vessel disorder ([place])   . Rheumatoid arthritis with rheumatoid factor (HCC)   .  Sleep apnea     Family History: Family History  Problem Relation Age of Onset  . Diabetes Mother   . Diabetes Father   . Heart attack Maternal Grandmother     Social History: Social History   Socioeconomic History  . Marital status: Married    Spouse name: Not on file  . Number of children:  Not on file  . Years of education: Not on file  . Highest education level: Not on file  Occupational History  . Not on file  Social Needs  . Financial resource strain: Not on file  . Food insecurity    Worry: Not on file    Inability: Not on file  . Transportation needs    Medical: Not on file    Non-medical: Not on file  Tobacco Use  . Smoking status: Current Some Day Smoker    Years: 2.00    Types: Cigars  . Smokeless tobacco: Never Used  . Tobacco comment: Occasional   Substance and Sexual Activity  . Alcohol use: No  . Drug use: No  . Sexual activity: Not on file  Lifestyle  . Physical activity    Days per week: Not on file    Minutes per session: Not on file  . Stress: Not on file  Relationships  . Social Herbalist on phone: Not on file    Gets together: Not on file    Attends religious service: Not on file    Active member of club or organization: Not on file    Attends meetings of clubs or organizations: Not on file    Relationship status: Not on file  . Intimate partner violence    Fear of current or ex partner: Not on file    Emotionally abused: Not on file    Physically abused: Not on file    Forced sexual activity: Not on file  Other Topics Concern  . Not on file  Social History Narrative  . Not on file    Vital Signs: Blood pressure (!) 187/74, pulse 63, temperature 98.2 F (36.8 C), resp. rate 16, height 5\' 7"  (1.702 m), weight 126 lb (57.2 kg), SpO2 97 %.  Examination: General Appearance: The patient is well-developed, well-nourished, and in no distress. Skin: Gross inspection of skin unremarkable. Head: normocephalic, no gross deformities. Eyes: no gross deformities noted. ENT: ears appear grossly normal no exudates. Neck: Supple. No thyromegaly. No LAD. Respiratory: clear bilaterally. Cardiovascular: Normal S1 and S2 without murmur or rub. Extremities: No cyanosis. pulses are equal. Neurologic: Alert and oriented. No involuntary  movements.  LABS: Recent Results (from the past 2160 hour(s))  UA/M w/rflx Culture, Routine     Status: Abnormal   Collection Time: 04/20/19  2:21 PM   Specimen: Urine   URINE  Result Value Ref Range   Specific Gravity, UA 1.006 1.005 - 1.030   pH, UA 5.5 5.0 - 7.5   Color, UA Yellow Yellow   Appearance Ur Clear Clear   Leukocytes,UA Negative Negative   Protein,UA Negative Negative/Trace   Glucose, UA Negative Negative   Ketones, UA Negative Negative   RBC, UA 2+ (A) Negative   Bilirubin, UA Negative Negative   Urobilinogen, Ur 0.2 0.2 - 1.0 mg/dL   Nitrite, UA Negative Negative   Microscopic Examination See below:     Comment: Microscopic was indicated and was performed.   Urinalysis Reflex Comment     Comment: This specimen will not reflex to a Urine  Culture.  Microscopic Examination     Status: Abnormal   Collection Time: 04/20/19  2:21 PM   URINE  Result Value Ref Range   WBC, UA 0-5 0 - 5 /hpf   RBC 3-10 (A) 0 - 2 /hpf   Epithelial Cells (non renal) None seen 0 - 10 /hpf   Casts None seen None seen /lpf   Mucus, UA Present Not Estab.   Bacteria, UA Few None seen/Few  CBC with Differential/Platelet     Status: Abnormal   Collection Time: 04/21/19  9:45 AM  Result Value Ref Range   WBC 9.2 4.0 - 10.5 K/uL   RBC 4.94 4.22 - 5.81 MIL/uL   Hemoglobin 15.1 13.0 - 17.0 g/dL   HCT 44.6 39.0 - 52.0 %   MCV 90.3 80.0 - 100.0 fL   MCH 30.6 26.0 - 34.0 pg   MCHC 33.9 30.0 - 36.0 g/dL   RDW 14.1 11.5 - 15.5 %   Platelets 262 150 - 400 K/uL   nRBC 0.0 0.0 - 0.2 %   Neutrophils Relative % 43 %   Neutro Abs 3.9 1.7 - 7.7 K/uL   Lymphocytes Relative 44 %   Lymphs Abs 4.1 (H) 0.7 - 4.0 K/uL   Monocytes Relative 7 %   Monocytes Absolute 0.6 0.1 - 1.0 K/uL   Eosinophils Relative 4 %   Eosinophils Absolute 0.4 0.0 - 0.5 K/uL   Basophils Relative 2 %   Basophils Absolute 0.2 (H) 0.0 - 0.1 K/uL   Immature Granulocytes 0 %   Abs Immature Granulocytes 0.03 0.00 - 0.07 K/uL     Comment: Performed at [facility], [address] [place], [place] [address]  Lipid panel     Status: Abnormal   Collection Time: 04/21/19  9:45 AM  Result Value Ref Range   Cholesterol 171 0 - 200 mg/dL   Triglycerides 93 <150 mg/dL   HDL 33 (L) >40 mg/dL   Total CHOL/HDL Ratio 5.2 RATIO   VLDL 19 0 - 40 mg/dL   LDL Cholesterol 119 (H) 0 - 99 mg/dL    Comment:        Total Cholesterol/HDL:CHD Risk Coronary Heart Disease Risk Table                     Men   Women  1/2 Average Risk   3.4   3.3  Average Risk       5.0   4.4  2 X Average Risk   9.6   7.1  3 X Average Risk  23.4   11.0        Use the calculated Patient Ratio above and the CHD Risk Table to determine the patient's CHD Risk.        ATP III CLASSIFICATION (LDL):  <100     mg/dL   Optimal  100-129  mg/dL   Near or Above                    Optimal  130-159  mg/dL   Borderline  160-189  mg/dL   High  >190     mg/dL   Very High Performed at [facility], [address] [place], [place] [address]   TSH     Status: None   Collection Time: 04/21/19  9:45 AM  Result Value Ref Range   TSH 1.134 0.350 - 4.500 uIU/mL    Comment: Performed by a 3rd Generation assay with a  functional sensitivity of <=0.01 uIU/mL. Performed at [facility], [address] [place], [place] [address]   T4, free     Status: None   Collection Time: 04/21/19  9:45 AM  Result Value Ref Range   Free T4 0.68 0.61 - 1.12 ng/dL    Comment: (NOTE) Biotin ingestion may interfere with free T4 tests. If the results are inconsistent with the TSH level, previous test results, or the clinical presentation, then consider biotin interference. If needed, order repeat testing after stopping biotin. Performed at [facility], [address] [place], [place] [address]   Comprehensive metabolic panel     Status: Abnormal   Collection Time: 04/21/19  9:45 AM  Result Value Ref Range   Sodium 144 135 - 145 mmol/L    Potassium 3.6 3.5 - 5.1 mmol/L   Chloride 113 (H) 98 - 111 mmol/L   CO2 21 (L) 22 - 32 mmol/L   Glucose, Bld 80 70 - 99 mg/dL   BUN 12 6 - 20 mg/dL   Creatinine, Ser 0.96 0.61 - 1.24 mg/dL   Calcium 8.8 (L) 8.9 - 10.3 mg/dL   Total Protein 6.4 (L) 6.5 - 8.1 g/dL   Albumin 3.1 (L) 3.5 - 5.0 g/dL   AST 15 15 - 41 U/L   ALT 9 0 - 44 U/L   Alkaline Phosphatase 81 38 - 126 U/L   Total Bilirubin 0.4 0.3 - 1.2 mg/dL   GFR calc non Af Amer >60 >60 mL/min   GFR calc Af Amer >60 >60 mL/min   Anion gap 10 5 - 15    Comment: Performed at [facility], [address], [place], [place] [address]  PSA     Status: None   Collection Time: 04/21/19  9:45 AM  Result Value Ref Range   Prostatic Specific Antigen 0.43 0.00 - 4.00 ng/mL    Comment: (NOTE) While PSA levels of <=4.0 ng/ml are reported as reference range, some men with levels below 4.0 ng/ml can have prostate cancer and many men with PSA above 4.0 ng/ml do not have prostate cancer.  Other tests such as free PSA, age specific reference ranges, PSA velocity and PSA doubling time may be helpful especially in men less than 12 years old. Performed at [facility], [place] [address], [place], [place] [address]   Pancreatic elastase, fecal     Status: None   Collection Time: 05/19/19  9:50 AM  Result Value Ref Range   Pancreatic Elastase-1, Stool 237 >200 ug Elast./g    Comment: (NOTE)       Severe Pancreatic Insufficiency:          <100       Moderate Pancreatic Insufficiency:   100 - 200       Normal:                                   >200 Performed At: [facility] [place], [place] [id number] Rush Farmer MD [id number]   Pulmonary function test     Status: None   Collection Time: 06/10/19  9:00 AM  Result Value Ref Range   FEV1     FVC     FEV1/FVC     TLC     DLCO    SARS CORONAVIRUS 2 (TAT 6-24 HRS) Nasopharyngeal Nasopharyngeal Swab     Status: None   Collection  Time: 06/18/19 12:19  PM   Specimen: Nasopharyngeal Swab  Result Value Ref Range   SARS Coronavirus 2 NEGATIVE NEGATIVE    Comment: (NOTE) SARS-CoV-2 target nucleic acids are NOT DETECTED. The SARS-CoV-2 RNA is generally detectable in upper and lower respiratory specimens during the acute phase of infection. Negative results do not preclude SARS-CoV-2 infection, do not rule out co-infections with other pathogens, and should not be used as the sole basis for treatment or other patient management decisions. Negative results must be combined with clinical observations, patient history, and epidemiological information. The expected result is Negative. Fact Sheet for Patients: SugarRoll.be Fact Sheet for Healthcare Providers: https://www.woods-mathews.com/ This test is not yet approved or cleared by the [place] FDA and  has been authorized for detection and/or diagnosis of SARS-CoV-2 by FDA under an Emergency Use Authorization (EUA). This EUA will remain  in effect (meaning this test can be used) for the duration of the COVID-19 declaration under Section 56 4(b)(1) of the Act, 21 U.S.C. section 360bbb-3(b)(1), unless the authorization is terminated or revoked sooner. Performed at [facility], [place] [address], [place], [place] [address]   CBC with Differential     Status: None   Collection Time: 06/19/19  3:36 PM  Result Value Ref Range   WBC 8.4 4.0 - 10.5 K/uL   RBC 4.74 4.22 - 5.81 MIL/uL   Hemoglobin 14.6 13.0 - 17.0 g/dL   HCT 42.4 39.0 - 52.0 %   MCV 89.5 80.0 - 100.0 fL   MCH 30.8 26.0 - 34.0 pg   MCHC 34.4 30.0 - 36.0 g/dL   RDW 13.2 11.5 - 15.5 %   Platelets 220 150 - 400 K/uL   nRBC 0.0 0.0 - 0.2 %   Neutrophils Relative % 44 %   Neutro Abs 3.7 1.7 - 7.7 K/uL   Lymphocytes Relative 47 %   Lymphs Abs 4.0 0.7 - 4.0 K/uL   Monocytes Relative 5 %   Monocytes Absolute 0.4 0.1 - 1.0 K/uL   Eosinophils Relative 2 %   Eosinophils Absolute 0.2  0.0 - 0.5 K/uL   Basophils Relative 2 %   Basophils Absolute 0.1 0.0 - 0.1 K/uL   Immature Granulocytes 0 %   Abs Immature Granulocytes 0.02 0.00 - 0.07 K/uL    Comment: Performed at [facility], [address] [place], [place] [address]  Comprehensive metabolic panel     Status: Abnormal   Collection Time: 06/19/19  3:36 PM  Result Value Ref Range   Sodium 141 135 - 145 mmol/L   Potassium 2.8 (L) 3.5 - 5.1 mmol/L   Chloride 107 98 - 111 mmol/L   CO2 24 22 - 32 mmol/L   Glucose, Bld 93 70 - 99 mg/dL   BUN 13 6 - 20 mg/dL   Creatinine, Ser 0.84 0.61 - 1.24 mg/dL   Calcium 8.7 (L) 8.9 - 10.3 mg/dL   Total Protein 6.2 (L) 6.5 - 8.1 g/dL   Albumin 3.2 (L) 3.5 - 5.0 g/dL   AST 16 15 - 41 U/L   ALT 10 0 - 44 U/L   Alkaline Phosphatase 73 38 - 126 U/L   Total Bilirubin 0.7 0.3 - 1.2 mg/dL   GFR calc non Af Amer >60 >60 mL/min   GFR calc Af Amer >60 >60 mL/min   Anion gap 10 5 - 15    Comment: Performed at [facility], [address], [place], [place] [address]  Troponin I (High  Sensitivity)     Status: None   Collection Time: 06/19/19  3:36 PM  Result Value Ref Range   Troponin I (High Sensitivity) 11 <18 ng/L    Comment: (NOTE) Elevated high sensitivity troponin I (hsTnI) values and significant  changes across serial measurements may suggest ACS but many other  chronic and acute conditions are known to elevate hsTnI results.  Refer to the Links section for chest pain algorithms and additional  guidance. Performed at [facility], [address] [place], [place] [address]   Magnesium     Status: None   Collection Time: 06/19/19  3:36 PM  Result Value Ref Range   Magnesium 1.9 1.7 - 2.4 mg/dL    Comment: Performed at [facility], [address] [place], [place] [address]  TSH     Status: Abnormal   Collection Time: 06/19/19  3:36 PM  Result Value Ref Range   TSH 0.272 (L) 0.350 - 4.500 uIU/mL    Comment: Performed by a 3rd  Generation assay with a functional sensitivity of <=0.01 uIU/mL. Performed at [facility], [address] [place], [place] [address]   T4, free     Status: None   Collection Time: 06/19/19  3:36 PM  Result Value Ref Range   Free T4 0.93 0.61 - 1.12 ng/dL    Comment: (NOTE) Biotin ingestion may interfere with free T4 tests. If the results are inconsistent with the TSH level, previous test results, or the clinical presentation, then consider biotin interference. If needed, order repeat testing after stopping biotin. Performed at [facility], [place], [place] [address]   Troponin I (High Sensitivity)     Status: None   Collection Time: 06/19/19  5:42 PM  Result Value Ref Range   Troponin I (High Sensitivity) 15 <18 ng/L    Comment: (NOTE) Elevated high sensitivity troponin I (hsTnI) values and significant  changes across serial measurements may suggest ACS but many other  chronic and acute conditions are known to elevate hsTnI results.  Refer to the "Links" section for chest pain algorithms and additional  guidance. Performed at [facility], [place], [place] [address]   SARS CORONAVIRUS 2 (TAT 6-24 HRS) Nasopharyngeal Nasopharyngeal Swab     Status: None   Collection Time: 06/30/19 10:06 AM   Specimen: Nasopharyngeal Swab  Result Value Ref Range   SARS Coronavirus 2 NEGATIVE NEGATIVE    Comment: (NOTE) SARS-CoV-2 target nucleic acids are NOT DETECTED. The SARS-CoV-2 RNA is generally detectable in upper and lower respiratory specimens during the acute phase of infection. Negative results do not preclude SARS-CoV-2 infection, do not rule out co-infections with other pathogens, and should not be used as the sole basis for treatment or other patient management decisions. Negative results must be combined with clinical observations, patient history, and epidemiological information. The expected result is  Negative. Fact Sheet for Patients: SugarRoll.be Fact Sheet for Healthcare Providers: https://www.woods-mathews.com/ This test is not yet approved or cleared by the [place] FDA and  has been authorized for detection and/or diagnosis of SARS-CoV-2 by FDA under an Emergency Use Authorization (EUA). This EUA will remain  in effect (meaning this test can be used) for the duration of the COVID-19 declaration under Section 56 4(b)(1) of the Act, 21 U.S.C. section 360bbb-3(b)(1), unless the authorization is terminated or revoked sooner. Performed at [facility], [place] [address], [place], [place] [address]   Cholesterol, body fluid  Status: None   Collection Time: 07/01/19  2:00 PM  Result Value Ref Range   Cholesterol, Fluid 72 mg/dL    Comment: (NOTE) INTERPRETIVE INFORMATION: Cholesterol, Body Fluid For information on body fluid reference ranges and/or interpretive guidance visit SuperbApps.be Test developed and characteristics determined by [facility]. See Compliance Statement B: [email or web address].fi Performed At: [facility] [address] [place], [place] [id number] Esau Grew MD [id number]    Chol, Fluid Type CYTO PLEU     Comment: Performed at [facility], [address] [place], [place] [address]  Amylase, pleural or peritoneal fluid     Status: None   Collection Time: 07/01/19  2:00 PM  Result Value Ref Range   Amylase, Fluid 24 U/L    Comment: NO NORMAL RANGE ESTABLISHED FOR THIS TEST   Fluid Type-FAMY CYTO PLEU     Comment: Performed at [facility], [address] [place], [place] [address]  Protein, pleural or peritoneal fluid     Status: None   Collection Time: 07/01/19  2:00 PM  Result Value Ref Range   Total protein, fluid 3.8 g/dL    Comment: (NOTE) No normal range established for this test Results should be evaluated in conjunction with serum  values    Fluid Type-FTP CYTO PLEU     Comment: Performed at [facility], [address] [place], [place] [address]  Fungus Culture With Stain     Status: None (Preliminary result)   Collection Time: 07/01/19  2:00 PM   Specimen: PATH Cytology Pleural fluid  Result Value Ref Range   Fungus Stain Final report     Comment: (NOTE) Performed At: [facility] [address] [place], [place] [id number] Rush Farmer MD [id number]    Fungus (Mycology) Culture PENDING    Fungal Source PLEURAL     Comment: Performed at [facility], [address] [place], [place] [address]  Glucose, pleural or peritoneal fluid     Status: None   Collection Time: 07/01/19  2:00 PM  Result Value Ref Range   Glucose, Fluid <20 mg/dL    Comment: (NOTE) No normal range established for this test Results should be evaluated in conjunction with serum values    Fluid Type-FGLU CYTO PLEU     Comment: Performed at [facility], [address] [place], [place] [address]  Acid Fast Smear (AFB)     Status: None   Collection Time: 07/01/19  2:00 PM   Specimen: PATH Cytology Pleural fluid  Result Value Ref Range   AFB Specimen Processing Concentration    Acid Fast Smear Negative     Comment: (NOTE) Performed At: [facility] [address] [place], [place] [id number] Rush Farmer MD [id number]    Source (AFB) PLEURAL     Comment: Performed at [facility], [address] [place],  [address]  Fungus Culture Result     Status: None   Collection Time: 07/01/19  2:00 PM  Result Value Ref Range   Result 1 Comment     Comment: (NOTE) KOH/Calcofluor preparation:  no fungus observed. Performed At: [facility] [place], [place] [id number] Rush Farmer MD [id number]   Cytology - Non PAP;     Status: None   Collection Time: 07/01/19  2:14 PM  Result Value Ref Range   CYTOLOGY - NON GYN      CYTOLOGY - NON  PAP CASE: [id number] PATIENT: Coda Dimaria Non-Gynecological Cytology Report  Specimen Submitted: A. Pleural fluid, left  Clinical History: None provided      DIAGNOSIS: A. PLEURAL FLUID, LEFT; ULTRASOUND-GUIDED THORACENTESIS: - NEGATIVE; NO EVIDENCE OF MALIGNANCY. - MESOTHELIAL CELLS AND MIXED INFLAMMATION.   GROSS DESCRIPTION: A. Labeled: Pleural fluid Received: Fresh Volume: 800 cc Description of fluid and container in which it is received: In glass medical container yellow cloudy fluid Cytospin slide(s) received: Yes #2  Specimen material submitted for: ThinPrep and cell block   Final Diagnosis performed by Betsy Pries, MD.   Electronically signed 07/03/2019 4:13:16PM The electronic signature indicates that the named Attending Pathologist has evaluated the specimen Technical component performed at [facility], [address] [place], [place] [address] Lab: [phone] Dir: Rush Farmer, MD, MMM  Professional component perform ed at [facility], [facility], [place], [place], [place] [address] Lab: [phone] Dir: Dellia Nims. Reuel Derby, MD     Radiology: Dg Chest Port 1 View  Result Date: 07/01/2019 CLINICAL DATA:  Post left-sided thoracentesis EXAM: PORTABLE CHEST 1 VIEW COMPARISON:  10/15/2009; chest CT-05/28/2019 FINDINGS: Grossly unchanged cardiac silhouette and mediastinal contours. Atherosclerotic plaque within the thoracic aorta. Interval reduction in persistent small partially loculated sided effusion post thoracentesis. No pneumothorax. Improved aeration of the left lower lung with persistent left basilar opacities. Mild pulmonary venous congestion without frank evidence of edema. No new focal airspace opacities. No acute osseous abnormalities. IMPRESSION: 1. Interval reduction in size of persistent small partially loculated left-sided effusion post thoracentesis. No pneumothorax. 2. Improved aeration of left lower lung with  persistent left basilar opacities, atelectasis versus infiltrate. 3. Pulmonary venous congestion without frank evidence of edema. 4.  Aortic Atherosclerosis (ICD10-I70.0). Electronically Signed   By: Sandi Mariscal M.D.   On: 07/01/2019 14:36   US Thoracentesis Asp Pleural Space W/img Guide  Result Date: 07/01/2019 INDICATION: Symptomatic left-sided pleural effusion. Please perform ultrasound-guided thoracentesis for diagnostic and therapeutic purposes. EXAM: US THORACENTESIS ASP PLEURAL SPACE W/IMG GUIDE COMPARISON:  None. MEDICATIONS: None. COMPLICATIONS: None immediate. TECHNIQUE: Informed written consent was obtained from the patient after a discussion of the risks, benefits and alternatives to treatment. A timeout was performed prior to the initiation of the procedure. Initial ultrasound scanning demonstrates a small to moderate-sized minimally complex left-sided pleural effusion. The inferolateral lower chest was prepped and draped in the usual sterile fashion. 1% lidocaine was used for local anesthesia. An ultrasound image was saved for documentation purposes. An 8 Fr Safe-T-Centesis catheter was introduced. The thoracentesis was performed. The catheter was removed and a dressing was applied. The patient tolerated the procedure well without immediate post procedural complication. The patient was escorted to have an upright chest radiograph. FINDINGS: A total of approximately 800 cc of serous fluid was removed. Requested samples were sent to the laboratory. IMPRESSION: Successful ultrasound-guided left sided thoracentesis yielding 800 cc of pleural fluid. Electronically Signed   By: Sandi Mariscal M.D.   On: 07/01/2019 14:40    No results found.  [place] Chest (pleural Effusion)  Result Date: 06/19/2019 CLINICAL DATA:  Left pleural effusion, attempted left thoracentesis EXAM: CHEST ULTRASOUND COMPARISON:  05/28/2019 FINDINGS: Ultrasound of the left chest demonstrates a moderate left effusion. Attempt was made  at performing a left thoracentesis however the patient experienced bradycardia and hypotension consistent with vagal episode associated diaphoresis. This resolved with lying the patient supine. A second attempt at placing the patient upright for thoracentesis resulted in another episode of bradycardia with heart rate in the 40s. Therefore the procedure was aborted. Patient will be transported  to the emergency room for evaluation. IMPRESSION: Ultrasound of the chest demonstrates left effusion. Procedure aborted related to bradycardia Electronically Signed   By: Jerilynn Mages.  Shick M.D.   On: 06/19/2019 16:30   Dg Chest Port 1 View  Result Date: 07/01/2019 CLINICAL DATA:  Post left-sided thoracentesis EXAM: PORTABLE CHEST 1 VIEW COMPARISON:  10/15/2009; chest CT-05/28/2019 FINDINGS: Grossly unchanged cardiac silhouette and mediastinal contours. Atherosclerotic plaque within the thoracic aorta. Interval reduction in persistent small partially loculated sided effusion post thoracentesis. No pneumothorax. Improved aeration of the left lower lung with persistent left basilar opacities. Mild pulmonary venous congestion without frank evidence of edema. No new focal airspace opacities. No acute osseous abnormalities. IMPRESSION: 1. Interval reduction in size of persistent small partially loculated left-sided effusion post thoracentesis. No pneumothorax. 2. Improved aeration of left lower lung with persistent left basilar opacities, atelectasis versus infiltrate. 3. Pulmonary venous congestion without frank evidence of edema. 4.  Aortic Atherosclerosis (ICD10-I70.0). Electronically Signed   By: Sandi Mariscal M.D.   On: 07/01/2019 14:36   US Thoracentesis Asp Pleural Space W/img Guide  Result Date: 07/01/2019 INDICATION: Symptomatic left-sided pleural effusion. Please perform ultrasound-guided thoracentesis for diagnostic and therapeutic purposes. EXAM: US THORACENTESIS ASP PLEURAL SPACE W/IMG GUIDE COMPARISON:  None. MEDICATIONS:  None. COMPLICATIONS: None immediate. TECHNIQUE: Informed written consent was obtained from the patient after a discussion of the risks, benefits and alternatives to treatment. A timeout was performed prior to the initiation of the procedure. Initial ultrasound scanning demonstrates a small to moderate-sized minimally complex left-sided pleural effusion. The inferolateral lower chest was prepped and draped in the usual sterile fashion. 1% lidocaine was used for local anesthesia. An ultrasound image was saved for documentation purposes. An 8 Fr Safe-T-Centesis catheter was introduced. The thoracentesis was performed. The catheter was removed and a dressing was applied. The patient tolerated the procedure well without immediate post procedural complication. The patient was escorted to have an upright chest radiograph. FINDINGS: A total of approximately 800 cc of serous fluid was removed. Requested samples were sent to the laboratory. IMPRESSION: Successful ultrasound-guided left sided thoracentesis yielding 800 cc of pleural fluid. Electronically Signed   By: Sandi Mariscal M.D.   On: 07/01/2019 14:40      Assessment and Plan: Patient Active Problem List   Diagnosis Date Noted  . Encounter for general adult medical examination with abnormal findings 04/22/2018  . Tick bite of abdominal wall 04/22/2018  . Candida rash of groin 04/22/2018    1. Pleural effusion, not elsewhere classified Cytology from thoracentesis is negative.  Pt will continue to follow up with GI for ongoing abdominal issues and we will consider PET scan based on what they find.   2. Weight loss Continue to follow up with GI as scheduled.   General Counseling: I have discussed the findings of the evaluation and examination with Nicole Kindred.  I have also discussed any further diagnostic evaluation thatmay be needed or ordered today. Daken verbalizes understanding of the findings of todays visit. We also reviewed his medications today and  discussed drug interactions and side effects including but not limited excessive drowsiness and altered mental states. We also discussed that there is always a risk not just to him but also people around him. he has been encouraged to call the office with any questions or concerns that should arise related to todays visit.    Time spent: 25  I have personally obtained a history, examined the patient, evaluated laboratory and imaging results, formulated the assessment  and plan and placed orders.    Saadat A Khan, MD FCCP Pulmonary and Critical Care Sleep medicine 

## 2019-07-31 LAB — FUNGUS CULTURE RESULT

## 2019-07-31 LAB — FUNGUS CULTURE WITH STAIN

## 2019-07-31 LAB — FUNGAL ORGANISM REFLEX

## 2019-08-03 ENCOUNTER — Telehealth: Payer: Self-pay

## 2019-08-03 NOTE — Telephone Encounter (Signed)
Called patient and asked them to call back so we can discuss follow up and schedule with Dr Humphrey Rolls. Beth

## 2019-08-05 ENCOUNTER — Other Ambulatory Visit: Payer: Medicare Other

## 2019-08-06 ENCOUNTER — Encounter: Payer: Self-pay | Admitting: Internal Medicine

## 2019-08-06 ENCOUNTER — Ambulatory Visit (INDEPENDENT_AMBULATORY_CARE_PROVIDER_SITE_OTHER): Payer: Medicare Other | Admitting: Internal Medicine

## 2019-08-06 ENCOUNTER — Other Ambulatory Visit: Payer: Self-pay

## 2019-08-06 VITALS — BP 150/90 | HR 72 | Temp 97.3°F | Resp 16 | Ht 68.0 in | Wt 128.4 lb

## 2019-08-06 DIAGNOSIS — J90 Pleural effusion, not elsewhere classified: Secondary | ICD-10-CM | POA: Diagnosis not present

## 2019-08-06 DIAGNOSIS — R634 Abnormal weight loss: Secondary | ICD-10-CM | POA: Diagnosis not present

## 2019-08-06 DIAGNOSIS — Z23 Encounter for immunization: Secondary | ICD-10-CM | POA: Diagnosis not present

## 2019-08-06 DIAGNOSIS — K219 Gastro-esophageal reflux disease without esophagitis: Secondary | ICD-10-CM

## 2019-08-06 DIAGNOSIS — I10 Essential (primary) hypertension: Secondary | ICD-10-CM | POA: Diagnosis not present

## 2019-08-06 NOTE — Progress Notes (Signed)
[facility] [place], [place] [address]  Pulmonary Sleep Medicine   Office Visit Note  Patient Name: Jose Cuevas DOB: 1960-12-10 MRN [id number]  Date of Service: 08/06/2019  Complaints/HPI: Pt is here for follow up and pulmonary clearance for upper GI.  Overall he is doing well.  DSK has reviewed his chart today, and the patient is clear for GI procedure at this time.  He continues to report GI disturbance.  His breathing remains good, status post thoracentesis.   ROS  General: (-) fever, (-) chills, (-) night sweats, (-) weakness Skin: (-) rashes, (-) itching,. Eyes: (-) visual changes, (-) redness, (-) itching. Nose and Sinuses: (-) nasal stuffiness or itchiness, (-) postnasal drip, (-) nosebleeds, (-) sinus trouble. Mouth and Throat: (-) sore throat, (-) hoarseness. Neck: (-) swollen glands, (-) enlarged thyroid, (-) neck pain. Respiratory: - cough, (-) bloody sputum, - shortness of breath, - wheezing. Cardiovascular: - ankle swelling, (-) chest pain. Lymphatic: (-) lymph node enlargement. Neurologic: (-) numbness, (-) tingling. Psychiatric: (-) anxiety, (-) depression   Current Medication: Outpatient Encounter Medications as of 08/06/2019  Medication Sig Note  . Adalimumab (HUMIRA PEN) 40 MG/0.4ML PNKT Inject into the skin. Every other week   . Aspirin-Caffeine (BC FAST PAIN RELIEF ARTHRITIS PO) Take by mouth.   . esomeprazole (NEXIUM) 40 MG capsule Take 40 mg by mouth daily at 12 noon.   . folic acid (FOLVITE) 1 MG tablet Take 1 mg by mouth daily.   Marland Kitchen ketoconazole (NIZORAL) 2 % cream Apply 1 application topically 2 (two) times daily.   Marland Kitchen LINZESS 290 MCG CAPS capsule Take 290 mcg by mouth daily before breakfast.   . methadone (DOLOPHINE) 10 MG tablet Take 10 mg by mouth 4 (four) times daily.    . methotrexate 50 MG/2ML injection Inject 50 mg/m2 into the vein once. 0.49mls once weekly 06/19/2019: Wednesday   . sucralfate (CARAFATE) 1 g tablet Take 1  g by mouth 3 (three) times daily.   . vitamin B-12 (CYANOCOBALAMIN) 1000 MCG tablet Take 1,000 mcg by mouth daily.    No facility-administered encounter medications on file as of 08/06/2019.     Surgical History: Past Surgical History:  Procedure Laterality Date  . BACK SURGERY    . COLONOSCOPY WITH PROPOFOL N/A 11/11/2015   Procedure: COLONOSCOPY WITH PROPOFOL;  Surgeon: [name], MD;  Location: [facility] ENDOSCOPY;  Service: Endoscopy;  Laterality: N/A;  . COLONOSCOPY WITH PROPOFOL N/A 12/02/2015   Procedure: COLONOSCOPY WITH PROPOFOL;  Surgeon: [name], MD;  Location: [facility] ENDOSCOPY;  Service: Endoscopy;  Laterality: N/A;  . ESOPHAGOGASTRODUODENOSCOPY (EGD) WITH PROPOFOL N/A 11/11/2015   Procedure: ESOPHAGOGASTRODUODENOSCOPY (EGD) WITH PROPOFOL;  Surgeon: [name], MD;  Location: [facility] ENDOSCOPY;  Service: Endoscopy;  Laterality: N/A;  . ESOPHAGOGASTRODUODENOSCOPY (EGD) WITH PROPOFOL N/A 12/02/2015   Procedure: ESOPHAGOGASTRODUODENOSCOPY (EGD) WITH PROPOFOL;  Surgeon: [name], MD;  Location: [facility] ENDOSCOPY;  Service: Endoscopy;  Laterality: N/A;  . ESOPHAGOGASTRODUODENOSCOPY (EGD) WITH PROPOFOL N/A 01/20/2016   Procedure: ESOPHAGOGASTRODUODENOSCOPY (EGD) WITH PROPOFOL;  Surgeon: [name], MD;  Location: [facility] ENDOSCOPY;  Service: Endoscopy;  Laterality: N/A;  . KNEE RECONSTRUCTION Left     Medical History: Past Medical History:  Diagnosis Date  . Collagen vascular disease (HCC)    Rhematoid Arthritis hx.  . DDD (degenerative disc disease), cervical   . Hyperlipidemia   . Hypertension   . Low kidney function   . Peripheral blood vessel disorder ([place])   .  Rheumatoid arthritis with rheumatoid factor (HCC)   . Sleep apnea     Family History: Family History  Problem Relation Age of Onset  . Diabetes Mother   . Diabetes Father   . Heart attack Maternal Grandmother     Social History: Social History   Socioeconomic History  .  Marital status: Married    Spouse name: Not on file  . Number of children: Not on file  . Years of education: Not on file  . Highest education level: Not on file  Occupational History  . Not on file  Social Needs  . Financial resource strain: Not on file  . Food insecurity    Worry: Not on file    Inability: Not on file  . Transportation needs    Medical: Not on file    Non-medical: Not on file  Tobacco Use  . Smoking status: Current Some Day Smoker    Years: 2.00    Types: Cigars  . Smokeless tobacco: Never Used  . Tobacco comment: Occasional   Substance and Sexual Activity  . Alcohol use: No  . Drug use: No  . Sexual activity: Not on file  Lifestyle  . Physical activity    Days per week: Not on file    Minutes per session: Not on file  . Stress: Not on file  Relationships  . Social Herbalist on phone: Not on file    Gets together: Not on file    Attends religious service: Not on file    Active member of club or organization: Not on file    Attends meetings of clubs or organizations: Not on file    Relationship status: Not on file  . Intimate partner violence    Fear of current or ex partner: Not on file    Emotionally abused: Not on file    Physically abused: Not on file    Forced sexual activity: Not on file  Other Topics Concern  . Not on file  Social History Narrative  . Not on file    Vital Signs: Blood pressure (!) 150/90, pulse 72, temperature (!) 97.3 F (36.3 C), resp. rate 16, height 5\' 8"  (1.727 m), weight 128 lb 6.4 oz (58.2 kg), SpO2 98 %.  Examination: General Appearance: The patient is well-developed, well-nourished, and in no distress. Skin: Gross inspection of skin unremarkable. Head: normocephalic, no gross deformities. Eyes: no gross deformities noted. ENT: ears appear grossly normal no exudates. Neck: Supple. No thyromegaly. No LAD. Respiratory: clear bilaterally. Cardiovascular: Normal S1 and S2 without murmur or  rub. Extremities: No cyanosis. pulses are equal. Neurologic: Alert and oriented. No involuntary movements.  LABS: Recent Results (from the past 2160 hour(s))  Pancreatic elastase, fecal     Status: None   Collection Time: 05/19/19  9:50 AM  Result Value Ref Range   Pancreatic Elastase-1, Stool 237 >200 ug Elast./g    Comment: (NOTE)       Severe Pancreatic Insufficiency:          <100       Moderate Pancreatic Insufficiency:   100 - 200       Normal:                                   >200 Performed At: [facility] [address] [place], [place] [id number] Rush Farmer MD [id number]   Pulmonary function test  Status: None   Collection Time: 06/10/19  9:00 AM  Result Value Ref Range   FEV1     FVC     FEV1/FVC     TLC     DLCO    SARS CORONAVIRUS 2 (TAT 6-24 HRS) Nasopharyngeal Nasopharyngeal Swab     Status: None   Collection Time: 06/18/19 12:19 PM   Specimen: Nasopharyngeal Swab  Result Value Ref Range   SARS Coronavirus 2 NEGATIVE NEGATIVE    Comment: (NOTE) SARS-CoV-2 target nucleic acids are NOT DETECTED. The SARS-CoV-2 RNA is generally detectable in upper and lower respiratory specimens during the acute phase of infection. Negative results do not preclude SARS-CoV-2 infection, do not rule out co-infections with other pathogens, and should not be used as the sole basis for treatment or other patient management decisions. Negative results must be combined with clinical observations, patient history, and epidemiological information. The expected result is Negative. Fact Sheet for Patients: SugarRoll.be Fact Sheet for Healthcare Providers: https://www.woods-mathews.com/ This test is not yet approved or cleared by the [place] FDA and  has been authorized for detection and/or diagnosis of SARS-CoV-2 by FDA under an Emergency Use Authorization (EUA). This EUA will remain  in effect (meaning this test can be  used) for the duration of the COVID-19 declaration under Section 56 4(b)(1) of the Act, 21 U.S.C. section 360bbb-3(b)(1), unless the authorization is terminated or revoked sooner. Performed at [facility], [place] [address], [place], [place] [address]   CBC with Differential     Status: None   Collection Time: 06/19/19  3:36 PM  Result Value Ref Range   WBC 8.4 4.0 - 10.5 K/uL   RBC 4.74 4.22 - 5.81 MIL/uL   Hemoglobin 14.6 13.0 - 17.0 g/dL   HCT 42.4 39.0 - 52.0 %   MCV 89.5 80.0 - 100.0 fL   MCH 30.8 26.0 - 34.0 pg   MCHC 34.4 30.0 - 36.0 g/dL   RDW 13.2 11.5 - 15.5 %   Platelets 220 150 - 400 K/uL   nRBC 0.0 0.0 - 0.2 %   Neutrophils Relative % 44 %   Neutro Abs 3.7 1.7 - 7.7 K/uL   Lymphocytes Relative 47 %   Lymphs Abs 4.0 0.7 - 4.0 K/uL   Monocytes Relative 5 %   Monocytes Absolute 0.4 0.1 - 1.0 K/uL   Eosinophils Relative 2 %   Eosinophils Absolute 0.2 0.0 - 0.5 K/uL   Basophils Relative 2 %   Basophils Absolute 0.1 0.0 - 0.1 K/uL   Immature Granulocytes 0 %   Abs Immature Granulocytes 0.02 0.00 - 0.07 K/uL    Comment: Performed at [facility], [address] [place], [place] [address]  Comprehensive metabolic panel     Status: Abnormal   Collection Time: 06/19/19  3:36 PM  Result Value Ref Range   Sodium 141 135 - 145 mmol/L   Potassium 2.8 (L) 3.5 - 5.1 mmol/L   Chloride 107 98 - 111 mmol/L   CO2 24 22 - 32 mmol/L   Glucose, Bld 93 70 - 99 mg/dL   BUN 13 6 - 20 mg/dL   Creatinine, Ser 0.84 0.61 - 1.24 mg/dL   Calcium 8.7 (L) 8.9 - 10.3 mg/dL   Total Protein 6.2 (L) 6.5 - 8.1 g/dL   Albumin 3.2 (L) 3.5 - 5.0 g/dL   AST 16 15 - 41 U/L   ALT 10 0 - 44 U/L   Alkaline Phosphatase 73 38 -  126 U/L   Total Bilirubin 0.7 0.3 - 1.2 mg/dL   GFR calc non Af Amer >60 >60 mL/min   GFR calc Af Amer >60 >60 mL/min   Anion gap 10 5 - 15    Comment: Performed at [facility], [place], [place] [address]  Troponin I (High  Sensitivity)     Status: None   Collection Time: 06/19/19  3:36 PM  Result Value Ref Range   Troponin I (High Sensitivity) 11 <18 ng/L    Comment: (NOTE) Elevated high sensitivity troponin I (hsTnI) values and significant  changes across serial measurements may suggest ACS but many other  chronic and acute conditions are known to elevate hsTnI results.  Refer to the Links section for chest pain algorithms and additional  guidance. Performed at [facility], [address] [place], [place] [address]   Magnesium     Status: None   Collection Time: 06/19/19  3:36 PM  Result Value Ref Range   Magnesium 1.9 1.7 - 2.4 mg/dL    Comment: Performed at [facility], [address] [place], [place] [address]  TSH     Status: Abnormal   Collection Time: 06/19/19  3:36 PM  Result Value Ref Range   TSH 0.272 (L) 0.350 - 4.500 uIU/mL    Comment: Performed by a 3rd Generation assay with a functional sensitivity of <=0.01 uIU/mL. Performed at [facility], [address] [place], [place] [address]   T4, free     Status: None   Collection Time: 06/19/19  3:36 PM  Result Value Ref Range   Free T4 0.93 0.61 - 1.12 ng/dL    Comment: (NOTE) Biotin ingestion may interfere with free T4 tests. If the results are inconsistent with the TSH level, previous test results, or the clinical presentation, then consider biotin interference. If needed, order repeat testing after stopping biotin. Performed at [facility], [place], [place] [address]   Troponin I (High Sensitivity)     Status: None   Collection Time: 06/19/19  5:42 PM  Result Value Ref Range   Troponin I (High Sensitivity) 15 <18 ng/L    Comment: (NOTE) Elevated high sensitivity troponin I (hsTnI) values and significant  changes across serial measurements may suggest ACS but many other  chronic and acute conditions are known to elevate hsTnI results.  Refer to the "Links" section for  chest pain algorithms and additional  guidance. Performed at [facility], [place], [place] [address]   SARS CORONAVIRUS 2 (TAT 6-24 HRS) Nasopharyngeal Nasopharyngeal Swab     Status: None   Collection Time: 06/30/19 10:06 AM   Specimen: Nasopharyngeal Swab  Result Value Ref Range   SARS Coronavirus 2 NEGATIVE NEGATIVE    Comment: (NOTE) SARS-CoV-2 target nucleic acids are NOT DETECTED. The SARS-CoV-2 RNA is generally detectable in upper and lower respiratory specimens during the acute phase of infection. Negative results do not preclude SARS-CoV-2 infection, do not rule out co-infections with other pathogens, and should not be used as the sole basis for treatment or other patient management decisions. Negative results must be combined with clinical observations, patient history, and epidemiological information. The expected result is Negative. Fact Sheet for Patients: SugarRoll.be Fact Sheet for Healthcare Providers: https://www.woods-mathews.com/ This test is not yet approved or cleared by the [place] FDA and  has been authorized for detection and/or diagnosis of SARS-CoV-2 by FDA under an Emergency Use Authorization (EUA).  This EUA will remain  in effect (meaning this test can be used) for the duration of the COVID-19 declaration under Section 56 4(b)(1) of the Act, 21 U.S.C. section 360bbb-3(b)(1), unless the authorization is terminated or revoked sooner. Performed at [facility], [place] [address] [place], [place] [address]   Cholesterol, body fluid     Status: None   Collection Time: 07/01/19  2:00 PM  Result Value Ref Range   Cholesterol, Fluid 72 mg/dL    Comment: (NOTE) INTERPRETIVE INFORMATION: Cholesterol, Body Fluid For information on body fluid reference ranges and/or interpretive guidance visit SuperbApps.be Test developed and characteristics determined by  [place]. See Compliance Statement B: [email or web address].fi Performed At: [facility] [address] [place], [place] [id number] Esau Grew MD [id number]    Chol, Fluid Type CYTO PLEU     Comment: Performed at [facility], [address] [place], [place] [address]  Amylase, pleural or peritoneal fluid     Status: None   Collection Time: 07/01/19  2:00 PM  Result Value Ref Range   Amylase, Fluid 24 U/L    Comment: NO NORMAL RANGE ESTABLISHED FOR THIS TEST   Fluid Type-FAMY CYTO PLEU     Comment: Performed at [facility], [address] [place], [place] [address]  Protein, pleural or peritoneal fluid     Status: None   Collection Time: 07/01/19  2:00 PM  Result Value Ref Range   Total protein, fluid 3.8 g/dL    Comment: (NOTE) No normal range established for this test Results should be evaluated in conjunction with serum values    Fluid Type-FTP CYTO PLEU     Comment: Performed at [facility], [address] [place], [place] [address]  Fungus Culture With Stain     Status: None   Collection Time: 07/01/19  2:00 PM   Specimen: PATH Cytology Pleural fluid  Result Value Ref Range   Fungus Stain Final report    Fungus (Mycology) Culture Final report     Comment: (NOTE) Performed At: [facility] [address] [place], [place] [id number] Rush Farmer MD [id number]    Fungal Source PLEURAL     Comment: Performed at [facility], [address] [place], [place] [address]  Glucose, pleural or peritoneal fluid     Status: None   Collection Time: 07/01/19  2:00 PM  Result Value Ref Range   Glucose, Fluid <20 mg/dL    Comment: (NOTE) No normal range established for this test Results should be evaluated in conjunction with serum values    Fluid Type-FGLU CYTO PLEU     Comment: Performed at [facility], [address] [place], [place] [address]  Acid Fast Smear (AFB)     Status: None    Collection Time: 07/01/19  2:00 PM   Specimen: PATH Cytology Pleural fluid  Result Value Ref Range   AFB Specimen Processing Concentration    Acid Fast Smear Negative     Comment: (NOTE) Performed At: [facility] [address] [place], [place] [id number] Rush Farmer MD [id number]    Source (AFB) PLEURAL     Comment: Performed at [facility], [address] [place], [place] [address]  Fungus Culture Result     Status: None   Collection Time: 07/01/19  2:00 PM  Result Value Ref Range   Result 1 Comment     Comment: (NOTE) KOH/Calcofluor preparation:  no fungus observed. Performed At: [facility] [place], [place] [id number] Rush Farmer MD [id number]   Fungal organism reflex     Status: None   Collection Time: 07/01/19  2:00 PM  Result Value Ref Range   Fungal result 1 Comment     Comment: (NOTE) No yeast or mold isolated after 4 weeks. Performed At: [facility] [place], [place] [id number] Rush Farmer MD [id number]   Cytology - Non PAP;     Status: None   Collection Time: 07/01/19  2:14 PM  Result Value Ref Range   CYTOLOGY - NON GYN      CYTOLOGY - NON PAP CASE: [id number] PATIENT: Neema Navia Non-Gynecological Cytology Report     Specimen Submitted: A. Pleural fluid, left  Clinical History: None provided      DIAGNOSIS: A. PLEURAL FLUID, LEFT; ULTRASOUND-GUIDED THORACENTESIS: - NEGATIVE; NO EVIDENCE OF MALIGNANCY. - MESOTHELIAL CELLS AND MIXED INFLAMMATION.   GROSS DESCRIPTION: A. Labeled: Pleural fluid Received: Fresh Volume: 800 cc Description of fluid and container in which it is received: In glass medical container yellow cloudy fluid Cytospin slide(s) received: Yes #2  Specimen material submitted for: ThinPrep and cell block   Final Diagnosis performed by Betsy Pries, MD.   Electronically signed 07/03/2019 4:13:16PM The electronic signature indicates  that the named Attending Pathologist has evaluated the specimen Technical component performed at [facility], [address] [place], [place] [address] Lab: [phone] Dir: Rush Farmer, MD, MMM  Professional component perform ed at [facility], [place], [place], [place] [address] Lab: [phone] Dir: Dellia Nims. Reuel Derby, MD     Radiology: Dg Chest Port 1 View  Result Date: 07/01/2019 CLINICAL DATA:  Post left-sided thoracentesis EXAM: PORTABLE CHEST 1 VIEW COMPARISON:  10/15/2009; chest CT-05/28/2019 FINDINGS: Grossly unchanged cardiac silhouette and mediastinal contours. Atherosclerotic plaque within the thoracic aorta. Interval reduction in persistent small partially loculated sided effusion post thoracentesis. No pneumothorax. Improved aeration of the left lower lung with persistent left basilar opacities. Mild pulmonary venous congestion without frank evidence of edema. No new focal airspace opacities. No acute osseous abnormalities. IMPRESSION: 1. Interval reduction in size of persistent small partially loculated left-sided effusion post thoracentesis. No pneumothorax. 2. Improved aeration of left lower lung with persistent left basilar opacities, atelectasis versus infiltrate. 3. Pulmonary venous congestion without frank evidence of edema. 4.  Aortic Atherosclerosis (ICD10-I70.0). Electronically Signed   By: Sandi Mariscal M.D.   On: 07/01/2019 14:36   US Thoracentesis Asp Pleural Space W/img Guide  Result Date: 07/01/2019 INDICATION: Symptomatic left-sided pleural effusion. Please perform ultrasound-guided thoracentesis for diagnostic and therapeutic purposes. EXAM: US THORACENTESIS ASP PLEURAL SPACE W/IMG GUIDE COMPARISON:  None. MEDICATIONS: None. COMPLICATIONS: None immediate. TECHNIQUE: Informed written consent was obtained from the patient after a discussion of the risks, benefits and alternatives to treatment. A timeout was performed prior to the initiation  of the procedure. Initial ultrasound scanning demonstrates a small to moderate-sized minimally complex left-sided pleural effusion. The inferolateral lower chest was prepped and draped in the usual sterile fashion. 1% lidocaine was used for local anesthesia. An ultrasound image was saved for documentation purposes. An 8 Fr Safe-T-Centesis catheter was introduced. The thoracentesis was performed. The catheter was removed and a dressing was applied. The patient tolerated the procedure well without immediate post procedural complication. The patient was escorted to have an upright chest radiograph. FINDINGS: A total of approximately 800 cc of serous fluid was removed. Requested samples were sent to the laboratory. IMPRESSION: Successful ultrasound-guided  left sided thoracentesis yielding 800 cc of pleural fluid. Electronically Signed   By: Sandi Mariscal M.D.   On: 07/01/2019 14:40    No results found.  No results found.    Assessment and Plan: Patient Active Problem List   Diagnosis Date Noted  . Encounter for general adult medical examination with abnormal findings 04/22/2018  . Tick bite of abdominal wall 04/22/2018  . Candida rash of groin 04/22/2018    1. Pleural effusion, not elsewhere classified Resolved post thoracentesis.  Pt is cleared for gi procedure at this time.  We do not feel that we have anything else to offer from a pulmonary perspective.  Will follow up with patient after GI.   2. Weight loss Pt continues to struggle with his weight.  He has lost 2 pounds in the last month.   3. Essential hypertension, benign Slightly elevated, will continue to monitor.   4. Gastroesophageal reflux disease without esophagitis Stable, continue GERD medication.   General Counseling: I have discussed the findings of the evaluation and examination with Jose Cuevas.  I have also discussed any further diagnostic evaluation thatmay be needed or ordered today. Jose Cuevas verbalizes understanding of the findings  of todays visit. We also reviewed his medications today and discussed drug interactions and side effects including but not limited excessive drowsiness and altered mental states. We also discussed that there is always a risk not just to him but also people around him. he has been encouraged to call the office with any questions or concerns that should arise related to todays visit.    Time spent: 15 This patient was seen by Orson Gear AGNP-C in Collaboration with Dr. Devona Konig as a part of collaborative care agreement.   I have personally obtained a history, examined the patient, evaluated laboratory and imaging results, formulated the assessment and plan and placed orders.    Allyne Gee, MD [facility] Pulmonary and Critical Care Sleep medicine

## 2019-08-11 ENCOUNTER — Encounter: Payer: Self-pay | Admitting: Cardiology

## 2019-08-11 ENCOUNTER — Other Ambulatory Visit: Payer: Self-pay

## 2019-08-11 ENCOUNTER — Encounter: Payer: Medicare Other | Admitting: Cardiology

## 2019-08-11 NOTE — Progress Notes (Signed)
This encounter was created in error - please disregard.  This encounter was created in error - please disregard.

## 2019-08-12 ENCOUNTER — Encounter: Payer: Self-pay | Admitting: Adult Health

## 2019-08-12 ENCOUNTER — Telehealth: Payer: Self-pay

## 2019-08-12 NOTE — Telephone Encounter (Signed)
Scheduled surgery clearance for patient at [facility] Dept [phone]

## 2019-08-13 ENCOUNTER — Telehealth: Payer: Self-pay

## 2019-08-13 NOTE — Telephone Encounter (Signed)
Called lmom informing patient of appointment. klh 

## 2019-08-17 ENCOUNTER — Ambulatory Visit: Payer: Medicare Other | Admitting: Adult Health

## 2019-08-18 LAB — ACID FAST CULTURE WITH REFLEXED SENSITIVITIES (MYCOBACTERIA): Acid Fast Culture: NEGATIVE

## 2019-08-18 NOTE — Telephone Encounter (Signed)
Faxed sx clearance to [facility]. klh

## 2019-09-04 ENCOUNTER — Other Ambulatory Visit: Payer: Medicare Other

## 2019-09-07 ENCOUNTER — Ambulatory Visit: Payer: Medicare Other | Admitting: Cardiology

## 2019-09-07 ENCOUNTER — Other Ambulatory Visit: Payer: Medicare Other | Admitting: Cardiology

## 2019-09-11 ENCOUNTER — Telehealth: Payer: Self-pay

## 2019-09-11 NOTE — Telephone Encounter (Signed)
Called and lmom for patient to call us in regards to his GI procedure that [facility] has attempted to schedule for him multiple times and have now reached out to [place]. We have cleared him for the procedure and needs to be scheduled with [facility]. Patient can reach them at [phone].

## 2019-09-15 ENCOUNTER — Other Ambulatory Visit
Admission: RE | Admit: 2019-09-15 | Discharge: 2019-09-15 | Disposition: A | Discharge status: Home / Self Care | Payer: Medicare Other | Source: Ambulatory Visit | Attending: Internal Medicine | Admitting: Internal Medicine

## 2019-09-15 ENCOUNTER — Encounter: Payer: Self-pay | Admitting: Internal Medicine

## 2019-09-15 DIAGNOSIS — Z20828 Contact with and (suspected) exposure to other viral communicable diseases: Secondary | ICD-10-CM | POA: Diagnosis not present

## 2019-09-15 DIAGNOSIS — Z01812 Encounter for preprocedural laboratory examination: Secondary | ICD-10-CM | POA: Insufficient documentation

## 2019-09-15 LAB — SARS CORONAVIRUS 2 (TAT 6-24 HRS): SARS Coronavirus 2: NEGATIVE

## 2019-09-16 ENCOUNTER — Ambulatory Visit: Payer: Medicare Other | Admitting: Anesthesiology

## 2019-09-16 ENCOUNTER — Encounter
Admission: RE | Disposition: A | Discharge status: Home / Self Care | Payer: Self-pay | Source: Home / Self Care | Attending: Internal Medicine

## 2019-09-16 ENCOUNTER — Ambulatory Visit
Admission: RE | Admit: 2019-09-16 | Discharge: 2019-09-16 | Disposition: A | Discharge status: Home / Self Care | Payer: Medicare Other | Attending: Internal Medicine | Admitting: Internal Medicine

## 2019-09-16 ENCOUNTER — Encounter: Payer: Self-pay | Admitting: Internal Medicine

## 2019-09-16 DIAGNOSIS — K3184 Gastroparesis: Secondary | ICD-10-CM | POA: Diagnosis not present

## 2019-09-16 DIAGNOSIS — Z79899 Other long term (current) drug therapy: Secondary | ICD-10-CM | POA: Insufficient documentation

## 2019-09-16 DIAGNOSIS — F17200 Nicotine dependence, unspecified, uncomplicated: Secondary | ICD-10-CM | POA: Insufficient documentation

## 2019-09-16 DIAGNOSIS — R112 Nausea with vomiting, unspecified: Secondary | ICD-10-CM | POA: Insufficient documentation

## 2019-09-16 DIAGNOSIS — R634 Abnormal weight loss: Secondary | ICD-10-CM | POA: Diagnosis not present

## 2019-09-16 DIAGNOSIS — K5909 Other constipation: Secondary | ICD-10-CM | POA: Insufficient documentation

## 2019-09-16 DIAGNOSIS — R1033 Periumbilical pain: Secondary | ICD-10-CM | POA: Diagnosis present

## 2019-09-16 DIAGNOSIS — G4730 Sleep apnea, unspecified: Secondary | ICD-10-CM | POA: Diagnosis not present

## 2019-09-16 DIAGNOSIS — M059 Rheumatoid arthritis with rheumatoid factor, unspecified: Secondary | ICD-10-CM | POA: Diagnosis not present

## 2019-09-16 DIAGNOSIS — K315 Obstruction of duodenum: Secondary | ICD-10-CM | POA: Diagnosis not present

## 2019-09-16 DIAGNOSIS — R6881 Early satiety: Secondary | ICD-10-CM | POA: Diagnosis not present

## 2019-09-16 DIAGNOSIS — I10 Essential (primary) hypertension: Secondary | ICD-10-CM | POA: Insufficient documentation

## 2019-09-16 DIAGNOSIS — K219 Gastro-esophageal reflux disease without esophagitis: Secondary | ICD-10-CM | POA: Insufficient documentation

## 2019-09-16 DIAGNOSIS — Q438 Other specified congenital malformations of intestine: Secondary | ICD-10-CM | POA: Insufficient documentation

## 2019-09-16 HISTORY — DX: Abnormal weight loss: R63.4

## 2019-09-16 HISTORY — DX: Personal history of other diseases of the digestive system: Z87.19

## 2019-09-16 HISTORY — DX: Gastroparesis: K31.84

## 2019-09-16 HISTORY — PX: ESOPHAGOGASTRODUODENOSCOPY (EGD) WITH PROPOFOL: SHX5813

## 2019-09-16 HISTORY — DX: Dorsalgia, unspecified: M54.9

## 2019-09-16 HISTORY — PX: COLONOSCOPY WITH PROPOFOL: SHX5780

## 2019-09-16 HISTORY — DX: Other constipation: K59.09

## 2019-09-16 HISTORY — DX: Personal history of urinary calculi: Z87.442

## 2019-09-16 SURGERY — ESOPHAGOGASTRODUODENOSCOPY (EGD) WITH PROPOFOL
Anesthesia: General

## 2019-09-16 MED ORDER — LIDOCAINE HCL (CARDIAC) PF 100 MG/5ML IV SOSY
PREFILLED_SYRINGE | INTRAVENOUS | Status: DC | PRN
  Administered 2019-09-16: 60 mg via INTRATRACHEAL

## 2019-09-16 MED ORDER — PROPOFOL 10 MG/ML IV BOLUS
INTRAVENOUS | Status: DC | PRN
  Administered 2019-09-16: 20 mg via INTRAVENOUS
  Administered 2019-09-16: 40 mg via INTRAVENOUS
  Administered 2019-09-16: 100 mg via INTRAVENOUS
  Administered 2019-09-16: 50 mg via INTRAVENOUS
  Administered 2019-09-16: 40 mg via INTRAVENOUS
  Administered 2019-09-16: 20 mg via INTRAVENOUS
  Administered 2019-09-16: 30 mg via INTRAVENOUS
  Administered 2019-09-16 (×2): 50 mg via INTRAVENOUS

## 2019-09-16 MED ORDER — SODIUM CHLORIDE 0.9 % IV SOLN: INTRAVENOUS | Status: DC

## 2019-09-16 MED ORDER — PROPOFOL 10 MG/ML IV BOLUS
INTRAVENOUS | Status: AC
  Filled 2019-09-16: qty 40

## 2019-09-16 MED ORDER — PROPOFOL 10 MG/ML IV BOLUS
INTRAVENOUS | Status: AC
  Filled 2019-09-16: qty 20

## 2019-09-16 NOTE — Anesthesia Postprocedure Evaluation (Signed)
Anesthesia Post Note  Patient: Jose Cuevas  Procedure(s) Performed: ESOPHAGOGASTRODUODENOSCOPY (EGD) WITH PROPOFOL (N/A ) COLONOSCOPY WITH PROPOFOL (N/A )  Patient location during evaluation: Endoscopy Anesthesia Type: General Level of consciousness: awake and alert and oriented Pain management: pain level controlled Vital Signs Assessment: post-procedure vital signs reviewed and stable Respiratory status: spontaneous breathing, nonlabored ventilation and respiratory function stable Cardiovascular status: blood pressure returned to baseline and stable Postop Assessment: no signs of nausea or vomiting Anesthetic complications: no     Last Vitals:  Vitals:   09/16/19 1447 09/16/19 1451  BP: (!) 164/69   Pulse: (!) 50 (!) 51  Resp: 14 13  Temp:    SpO2: 99% 100%    Last Pain:  Vitals:   09/16/19 1417  TempSrc: Temporal                 Ida Uppal

## 2019-09-16 NOTE — Transfer of Care (Signed)
Immediate Anesthesia Transfer of Care Note  Patient: Jose Cuevas  Procedure(s) Performed: ESOPHAGOGASTRODUODENOSCOPY (EGD) WITH PROPOFOL (N/A ) COLONOSCOPY WITH PROPOFOL (N/A )  Patient Location: Endoscopy Unit  Anesthesia Type:General  Level of Consciousness: awake, alert  and oriented  Airway & Oxygen Therapy: Patient Spontanous Breathing  Post-op Assessment: Report given to RN and Post -op Vital signs reviewed and stable  Post vital signs: Reviewed and stable  Last Vitals:  Vitals Value Taken Time  BP 138/72 09/16/19 1417  Temp    Pulse 56 09/16/19 1418  Resp 16 09/16/19 1418  SpO2 98 % 09/16/19 1418  Vitals shown include unvalidated device data.  Last Pain:  Vitals:   09/16/19 1244  TempSrc: Temporal         Complications: No apparent anesthesia complications

## 2019-09-16 NOTE — Op Note (Signed)
[facility] Gastroenterology Patient Name: Jose Cuevas Procedure Date: 09/16/2019 1:29 PM MRN: [id number] Account #: [id number] Date of Birth: July 02, 1961 Admit Type: Outpatient Age: 58 Room: [facility] ENDO ROOM 2 Gender: Male Note Status: Finalized Procedure:             Upper GI endoscopy Indications:           Periumbilical abdominal pain, Esophageal reflux,                         Stenosis of the duodenum, Early satiety, Nausea with                         vomiting Providers:             Benay Pike. Hena Ewalt MD, MD Medicines:             Propofol per Anesthesia Complications:         No immediate complications. Estimated blood loss:                         Minimal. Procedure:             Pre-Anesthesia Assessment:                        - The risks and benefits of the procedure and the                         sedation options and risks were discussed with the                         patient. All questions were answered and informed                         consent was obtained.                        - Patient identification and proposed procedure were                         verified prior to the procedure by the nurse. The                         procedure was verified in the procedure room.                        - ASA Grade Assessment: III - A patient with severe                         systemic disease.                        - After reviewing the risks and benefits, the patient                         was deemed in satisfactory condition to undergo the                         procedure.  After obtaining informed consent, the endoscope was                         passed under direct vision. Throughout the procedure,                         the patient's blood pressure, pulse, and oxygen                         saturations were monitored continuously. The Endoscope                         was introduced through the mouth, and advanced to the                          third part of duodenum. The upper GI endoscopy was                         technically difficult and complex due to stricture.                         Successful completion of the procedure was aided by                         performing the maneuvers documented (below) in this                         report. The patient tolerated the procedure well. Findings:      The Z-line was irregular and was found 38 to 39 cm from the incisors.       Mucosa was biopsied with a cold forceps for histology. One specimen       bottle was sent to pathology.      Diffuse moderately congested mucosa was found in the entire examined       stomach.      There is no endoscopic evidence of stenosis or stricture in the       prepyloric region of the stomach and in the pylorus.      An acquired benign-appearing, intrinsic severe stenosis was found in the       duodenal bulb and was traversed after dilation. A TTS dilator was passed       through the scope. Dilation with a 07-12-11 mm pyloric balloon dilator       was performed. The dilation site was examined following endoscope       reinsertion and showed moderate improvement in luminal narrowing.       Estimated blood loss was minimal.      Max dilation was to 41mm.      The exam was otherwise without abnormality. Impression:            - Z-line irregular, 38 to 39 cm from the incisors.                         Biopsied.                        - Congestive gastropathy.                        -  Acquired duodenal stenosis. Dilated.                        - The examination was otherwise normal. Recommendation:        - Await pathology results from EGD, also performed                         today.                        - Proceed with colonoscopy Procedure Code(s):     --- Professional ---                        [id number], Esophagogastroduodenoscopy, flexible,                         transoral; with dilation of gastric/duodenal                          stricture(s) (eg, balloon, bougie)                        43239, 10, Esophagogastroduodenoscopy, flexible,                         transoral; with biopsy, single or multiple Diagnosis Code(s):     --- Professional ---                        R11.2, Nausea with vomiting, unspecified                        R68.81, Early satiety                        K21.9, Gastro-esophageal reflux disease without                         esophagitis                        A999333, Periumbilical pain                        K31.5, Obstruction of duodenum                        K31.89, Other diseases of stomach and duodenum                        K22.8, Other specified diseases of esophagus CPT copyright 2019 American Medical Association. All rights reserved. The codes documented in this report are preliminary and upon coder review may  be revised to meet current compliance requirements. Efrain Sella MD, MD 09/16/2019 1:55:03 PM This report has been signed electronically. Number of Addenda: 0 Note Initiated On: 09/16/2019 1:29 PM Estimated Blood Loss:  Estimated blood loss was minimal.      [facility]

## 2019-09-16 NOTE — Anesthesia Preprocedure Evaluation (Signed)
Anesthesia Evaluation  Patient identified by MRN, date of birth, ID band Patient awake    Reviewed: Allergy & Precautions, H&P , NPO status , Patient's Chart, lab work & pertinent test results  History of Anesthesia Complications Negative for: history of anesthetic complications  Airway Mallampati: III  TM Distance: >3 FB Neck ROM: full    Dental  (+) Poor Dentition, Missing, Upper Dentures   Pulmonary neg shortness of breath, sleep apnea , Current Smoker and Patient abstained from smoking.,    Pulmonary exam normal breath sounds clear to auscultation       Cardiovascular Exercise Tolerance: Good hypertension, (-) angina(-) Past MI and (-) DOE Normal cardiovascular exam Rhythm:regular Rate:Normal     Neuro/Psych negative neurological ROS  negative psych ROS   GI/Hepatic negative GI ROS, Neg liver ROS, neg GERD  ,  Endo/Other  negative endocrine ROS  Renal/GU negative Renal ROS  negative genitourinary   Musculoskeletal  (+) Arthritis ,   Abdominal   Peds  Hematology negative hematology ROS (+)   Anesthesia Other Findings Past Medical History:   Collagen vascular disease (HCC)                              Hypertension                                                 Rheumatoid arthritis with rheumatoid factor (H*             Past Surgical History:   KNEE RECONSTRUCTION                             Left              BACK SURGERY                                                  COLONOSCOPY WITH PROPOFOL                       N/A 11/11/2015      Comment:Procedure: COLONOSCOPY WITH PROPOFOL;  Surgeon:              [name], MD;  Location: [facility]               ENDOSCOPY;  Service: Endoscopy;  Laterality:               N/A;   ESOPHAGOGASTRODUODENOSCOPY (EGD) WITH PROPOFOL  N/A 11/11/2015      Comment:Procedure: ESOPHAGOGASTRODUODENOSCOPY (EGD)               WITH PROPOFOL;  Surgeon: [name],         MD;  Location: [facility] ENDOSCOPY;  Service:               Endoscopy;  Laterality: N/A;  BMI    Body Mass Index   21.29 kg/m 2      Reproductive/Obstetrics negative OB ROS  Anesthesia Physical  Anesthesia Plan  ASA: III  Anesthesia Plan: General   Post-op Pain Management:    Induction: Intravenous  PONV Risk Score and Plan: 1 and Propofol infusion and TIVA  Airway Management Planned: Natural Airway and Nasal Cannula  Additional Equipment:   Intra-op Plan:   Post-operative Plan:   Informed Consent: I have reviewed the patients History and Physical, chart, labs and discussed the procedure including the risks, benefits and alternatives for the proposed anesthesia with the patient or authorized representative who has indicated his/her understanding and acceptance.     Dental Advisory Given  Plan Discussed with: Anesthesiologist, CRNA and Surgeon  Anesthesia Plan Comments:         Anesthesia Quick Evaluation

## 2019-09-16 NOTE — Anesthesia Post-op Follow-up Note (Signed)
Anesthesia QCDR form completed.        

## 2019-09-16 NOTE — Op Note (Signed)
[facility] Gastroenterology Patient Name: Jose Cuevas Procedure Date: 09/16/2019 1:29 PM MRN: [id number] Account #: [id number] Date of Birth: [birth year] Admit Type: Outpatient Age: 58 Room: [facility] ENDO ROOM 2 Gender: Male Note Status: Finalized Procedure:             Colonoscopy Indications:           Periumbilical abdominal pain, Weight loss, Incidental                         change in bowel habits noted Providers:             Benay Pike. Birdie Beveridge MD, MD Medicines:             Propofol per Anesthesia Complications:         No immediate complications. Procedure:             Pre-Anesthesia Assessment:                        - The risks and benefits of the procedure and the                         sedation options and risks were discussed with the                         patient. All questions were answered and informed                         consent was obtained.                        - Patient identification and proposed procedure were                         verified prior to the procedure by the nurse. The                         procedure was verified in the procedure room.                        - ASA Grade Assessment: III - A patient with severe                         systemic disease.                        - After reviewing the risks and benefits, the patient                         was deemed in satisfactory condition to undergo the                         procedure.                        After obtaining informed consent, the colonoscope was                         passed under direct vision. Throughout the procedure,  the patient's blood pressure, pulse, and oxygen                         saturations were monitored continuously. The                         Colonoscope was introduced through the anus and                         advanced to the the cecum, identified by appendiceal                         orifice and ileocecal valve.  The colonoscopy was                         technically difficult and complex due to a tortuous                         colon. Successful completion of the procedure was                         aided by applying abdominal pressure. The patient                         tolerated the procedure well. The quality of the bowel                         preparation was adequate. The ileocecal valve,                         appendiceal orifice, and rectum were photographed. Findings:      The perianal and digital rectal examinations were normal. Pertinent       negatives include normal sphincter tone and no palpable rectal lesions.      The transverse colon was significantly tortuous. The patient was       repositioned because of difficulty with the procedure.      Normal mucosa was found in the entire colon. Estimated blood loss: none.      The exam was otherwise without abnormality on direct and retroflexion       views. Impression:            - Tortuous colon.                        - Normal mucosa in the entire examined colon.                        - The examination was otherwise normal on direct and                         retroflexion views.                        - No specimens collected. Recommendation:        - Await pathology results from EGD, also performed                         today.                        -  Monitor results to duodenal stricture dilation.                         Consider repeat balloon dilation of stricture in 3                         weeks.                        - Return to physician assistant in 2 weeks.                        - The findings and recommendations were discussed with                         the patient.                        - IF patient does not have sustained response to                         serial balloon dilations, surgery may be required to                         resect scarred area of bulb of the duodenum. This                          could, however, exacerbate weight loss in at least the                         short term.                        - The findings and recommendations were discussed with                         the patient. Procedure Code(s):     --- Professional ---                        [phone], Colonoscopy, flexible; diagnostic, including                         collection of specimen(s) by brushing or washing, when                         performed (separate procedure) Diagnosis Code(s):     --- Professional ---                        Q43.8, Other specified congenital malformations of                         intestine                        R63.4, Abnormal weight loss                        [id number], Periumbilical pain CPT copyright 2019 American Medical Association. All rights reserved. The codes documented in this report are preliminary and upon coder review may  be revised to meet current compliance requirements. Efrain Sella MD, MD 09/16/2019 2:19:36 PM This report has been signed electronically. Number of Addenda: 0 Note Initiated On: 09/16/2019 1:29 PM Scope Withdrawal Time: 0 hours 5 minutes 58 seconds  Total Procedure Duration: 0 hours 15 minutes 39 seconds  Estimated Blood Loss:  Estimated blood loss: none.      [facility]

## 2019-09-16 NOTE — H&P (Signed)
Outpatient short stay form Pre-procedure 09/16/2019 12:18 PM Jose Cuevas K. Alice Reichert, M.D.  Primary Physician: Devona Konig, M.D.  Reason for visit: Constipation, weight loss, early satiety, history of a duodenal ulcer with stricture requiring balloon dilation, gastroparesis, weight loss.  , History of present illness: 58 year old male with a history of tubular adenoma removed and 2017 colonoscopy presents with early satiety, weight loss.  He has a history of gastroparesis and is unable to eat much.  He has a history of duodenal stricture with gastric outlet obstruction requiring pyloric balloon dilation in April 2017.  Patient has chronic constipation and is taking methadone chronically.   No current facility-administered medications for this encounter.  Current Outpatient Medications:  .  Adalimumab (HUMIRA PEN) 40 MG/0.4ML PNKT, Inject into the skin. Every other week, Disp: , Rfl:  .  Aspirin-Caffeine (BC FAST PAIN RELIEF ARTHRITIS PO), Take by mouth., Disp: , Rfl:  .  esomeprazole (NEXIUM) 40 MG capsule, Take 40 mg by mouth daily at 12 noon., Disp: , Rfl:  .  folic acid (FOLVITE) 1 MG tablet, Take 1 mg by mouth daily., Disp: , Rfl:  .  ketoconazole (NIZORAL) 2 % cream, Apply 1 application topically 2 (two) times daily., Disp: 15 g, Rfl: 0 .  LINZESS 290 MCG CAPS capsule, Take 290 mcg by mouth daily before breakfast., Disp: , Rfl:  .  methadone (DOLOPHINE) 10 MG tablet, Take 10 mg by mouth 4 (four) times daily. , Disp: , Rfl:  .  methotrexate 50 MG/2ML injection, Inject 50 mg/m2 into the vein once. 0.38mls once every 2 weeks, Disp: , Rfl:  .  sucralfate (CARAFATE) 1 g tablet, Take 1 g by mouth 3 (three) times daily., Disp: , Rfl:  .  vitamin B-12 (CYANOCOBALAMIN) 1000 MCG tablet, Take 1,000 mcg by mouth daily., Disp: , Rfl:   No medications prior to admission.     No Known Allergies   Past Medical History:  Diagnosis Date  . Back pain   . Collagen vascular disease (HCC)    Rhematoid  Arthritis hx.  . Constipation, chronic   . DDD (degenerative disc disease), cervical   . History of duodenal ulcer   . Hyperlipidemia   . Hypertension   . Low kidney function   . Nondiabetic gastroparesis   . Peripheral blood vessel disorder ([place])   . Rheumatoid arthritis with rheumatoid factor (HCC)   . Sleep apnea   . Weight loss     Review of systems:  Otherwise negative.    Physical Exam  Gen: Alert, oriented. Appears stated age.  HEENT: [place]/AT. PERRLA. Lungs: CTA, no wheezes. CV: RR nl S1, S2. Abd: soft, benign, no masses. BS+ Ext: No edema. Pulses 2+    Planned procedures: Proceed with EGD and colonoscopy. The patient understands the nature of the planned procedure, indications, risks, alternatives and potential complications including but not limited to bleeding, infection, perforation, damage to internal organs and possible oversedation/side effects from anesthesia. The patient agrees and gives consent to proceed.  Please refer to procedure notes for findings, recommendations and patient disposition/instructions.     Jose Cuevas K. Alice Reichert, M.D. Gastroenterology 09/16/2019  12:18 PM

## 2019-09-17 ENCOUNTER — Encounter: Payer: Self-pay | Admitting: *Deleted

## 2019-09-18 LAB — SURGICAL PATHOLOGY

## 2019-11-20 ENCOUNTER — Telehealth: Payer: Self-pay

## 2019-11-20 NOTE — Telephone Encounter (Signed)
Called lmom informed patient of appointment on 11/24/2019. klh

## 2019-11-24 ENCOUNTER — Ambulatory Visit: Payer: Medicare Other | Admitting: Internal Medicine

## 2020-04-22 ENCOUNTER — Telehealth: Payer: Self-pay

## 2020-04-22 NOTE — Telephone Encounter (Signed)
Called lmom informing patient of appointment on 04/25/2020. klh

## 2020-04-25 ENCOUNTER — Ambulatory Visit (INDEPENDENT_AMBULATORY_CARE_PROVIDER_SITE_OTHER): Payer: Medicare Other | Admitting: Adult Health

## 2020-04-25 ENCOUNTER — Telehealth: Payer: Self-pay

## 2020-04-25 ENCOUNTER — Other Ambulatory Visit: Payer: Self-pay

## 2020-04-25 VITALS — BP 150/82 | HR 61 | Temp 97.4°F | Resp 16 | Ht 68.0 in | Wt 127.6 lb

## 2020-04-25 DIAGNOSIS — R300 Dysuria: Secondary | ICD-10-CM

## 2020-04-25 DIAGNOSIS — R5382 Chronic fatigue, unspecified: Secondary | ICD-10-CM | POA: Diagnosis not present

## 2020-04-25 DIAGNOSIS — R5381 Other malaise: Secondary | ICD-10-CM

## 2020-04-25 DIAGNOSIS — M5440 Lumbago with sciatica, unspecified side: Secondary | ICD-10-CM

## 2020-04-25 DIAGNOSIS — Z0001 Encounter for general adult medical examination with abnormal findings: Secondary | ICD-10-CM

## 2020-04-25 DIAGNOSIS — I10 Essential (primary) hypertension: Secondary | ICD-10-CM | POA: Diagnosis not present

## 2020-04-25 DIAGNOSIS — R918 Other nonspecific abnormal finding of lung field: Secondary | ICD-10-CM

## 2020-04-25 DIAGNOSIS — K219 Gastro-esophageal reflux disease without esophagitis: Secondary | ICD-10-CM

## 2020-04-25 DIAGNOSIS — G8929 Other chronic pain: Secondary | ICD-10-CM

## 2020-04-25 NOTE — Telephone Encounter (Signed)
Called lmom informing patient of appointment on 04/25/2020. klh

## 2020-04-25 NOTE — Progress Notes (Signed)
[facility] [place], [place] [address]  Internal MEDICINE  Office Visit Note  Patient Name: Jose Cuevas  [id number]  [id number]  Date of Service: 04/25/2020  Chief Complaint  Patient presents with  . Medicare Wellness    feels like muscle spasam from lower back and works its round to hands not often but sometimes,burning sensation from back of left leg  . Hypertension  . Hyperlipidemia     HPI Pt is here for routine health maintenance examination.  He has a history of HTN, HLD.  He also has some pulmonary nodules found on CT in august 2020, he is due for repeat CT scan at this time. He continues to complain of poor appetite, weakness and fatigue. He unfortunately continues to smoke less than 1/2 Ppd.  Denies illicit drug use or alcohol consumption.  He also reports 3 episodes of dizziness over the last two months.  He reports they resolve fairly quickly.  Denies falling.   Current Medication: Outpatient Encounter Medications as of 04/25/2020  Medication Sig Note  . Aspirin-Caffeine (BC FAST PAIN RELIEF ARTHRITIS PO) Take by mouth.   . esomeprazole (NEXIUM) 40 MG capsule Take 40 mg by mouth daily at 12 noon.   . folic acid (FOLVITE) 1 MG tablet Take 1 mg by mouth daily.   . methadone (DOLOPHINE) 10 MG tablet Take 10 mg by mouth 4 (four) times daily.    . methotrexate 50 MG/2ML injection Inject 50 mg/m2 into the vein once. 0.31mls once every 2 weeks 06/19/2019: Wednesday   . pantoprazole (PROTONIX) 40 MG tablet Take 40 mg by mouth 2 (two) times daily.   . vitamin B-12 (CYANOCOBALAMIN) 1000 MCG tablet Take 1,000 mcg by mouth daily.   . [DISCONTINUED] Adalimumab (HUMIRA PEN) 40 MG/0.4ML PNKT Inject into the skin. Every other week   . [DISCONTINUED] ketoconazole (NIZORAL) 2 % cream Apply 1 application topically 2 (two) times daily.   . [DISCONTINUED] LINZESS 290 MCG CAPS capsule Take 290 mcg by mouth daily before breakfast.   . [DISCONTINUED] sucralfate  (CARAFATE) 1 g tablet Take 1 g by mouth 3 (three) times daily.    No facility-administered encounter medications on file as of 04/25/2020.    Surgical History: Past Surgical History:  Procedure Laterality Date  . BACK SURGERY    . COLONOSCOPY WITH PROPOFOL N/A 11/11/2015   Procedure: COLONOSCOPY WITH PROPOFOL;  Surgeon: [name], MD;  Location: [facility] ENDOSCOPY;  Service: Endoscopy;  Laterality: N/A;  . COLONOSCOPY WITH PROPOFOL N/A 12/02/2015   Procedure: COLONOSCOPY WITH PROPOFOL;  Surgeon: [name], MD;  Location: [facility] ENDOSCOPY;  Service: Endoscopy;  Laterality: N/A;  . COLONOSCOPY WITH PROPOFOL N/A 09/16/2019   Procedure: COLONOSCOPY WITH PROPOFOL;  Surgeon: Toledo, Benay Pike, MD;  Location: [facility] ENDOSCOPY;  Service: Gastroenterology;  Laterality: N/A;  . ESOPHAGOGASTRODUODENOSCOPY (EGD) WITH PROPOFOL N/A 11/11/2015   Procedure: ESOPHAGOGASTRODUODENOSCOPY (EGD) WITH PROPOFOL;  Surgeon: [name], MD;  Location: [facility] ENDOSCOPY;  Service: Endoscopy;  Laterality: N/A;  . ESOPHAGOGASTRODUODENOSCOPY (EGD) WITH PROPOFOL N/A 12/02/2015   Procedure: ESOPHAGOGASTRODUODENOSCOPY (EGD) WITH PROPOFOL;  Surgeon: [name], MD;  Location: [facility] ENDOSCOPY;  Service: Endoscopy;  Laterality: N/A;  . ESOPHAGOGASTRODUODENOSCOPY (EGD) WITH PROPOFOL N/A 01/20/2016   Procedure: ESOPHAGOGASTRODUODENOSCOPY (EGD) WITH PROPOFOL;  Surgeon: [name], MD;  Location: [facility] ENDOSCOPY;  Service: Endoscopy;  Laterality: N/A;  . ESOPHAGOGASTRODUODENOSCOPY (EGD) WITH PROPOFOL N/A 09/16/2019   Procedure: ESOPHAGOGASTRODUODENOSCOPY (EGD) WITH PROPOFOL;  Surgeon: Alice Reichert, Benay Pike, MD;  Location: [facility] ENDOSCOPY;  Service: Gastroenterology;  Laterality: N/A;  . KNEE RECONSTRUCTION Left     Medical History: Past Medical History:  Diagnosis Date  . Back pain   . Collagen vascular disease (HCC)    Rhematoid Arthritis hx.  . Constipation, chronic   . DDD (degenerative disc  disease), cervical   . History of duodenal ulcer   . History of kidney stones   . Hyperlipidemia   . Hypertension   . Low kidney function    LT Kidney non-functioning  . Nondiabetic gastroparesis   . Peripheral blood vessel disorder ([place])   . Rheumatoid arthritis with rheumatoid factor (HCC)   . Sleep apnea   . Weight loss     Family History: Family History  Problem Relation Age of Onset  . Diabetes Mother   . Diabetes Father   . Heart attack Maternal Grandmother       Review of Systems  Constitutional: Negative.  Negative for chills, fatigue and unexpected weight change.  HENT: Negative.  Negative for congestion, rhinorrhea, sneezing and sore throat.   Eyes: Negative for redness.  Respiratory: Negative.  Negative for cough, chest tightness and shortness of breath.   Cardiovascular: Negative.  Negative for chest pain and palpitations.  Gastrointestinal: Negative.  Negative for abdominal pain, constipation, diarrhea, nausea and vomiting.  Endocrine: Negative.   Genitourinary: Negative.  Negative for dysuria and frequency.  Musculoskeletal: Negative.  Negative for arthralgias, back pain, joint swelling and neck pain.  Skin: Negative.  Negative for rash.  Allergic/Immunologic: Negative.   Neurological: Negative.  Negative for tremors and numbness.  Hematological: Negative for adenopathy. Does not bruise/bleed easily.  Psychiatric/Behavioral: Negative.  Negative for behavioral problems, sleep disturbance and suicidal ideas. The patient is not nervous/anxious.      Vital Signs: BP (!) 168/76   Pulse 61   Temp (!) 97.4 F (36.3 C)   Resp 16   Ht 5\' 8"  (1.727 m)   Wt 127 lb 9.6 oz (57.9 kg)   SpO2 96%   BMI 19.40 kg/m    Physical Exam Vitals and nursing note reviewed.  Constitutional:      General: He is not in acute distress.    Appearance: He is well-developed. He is not diaphoretic.  HENT:     Head: Normocephalic and atraumatic.     Mouth/Throat:      Pharynx: No oropharyngeal exudate.  Eyes:     Pupils: Pupils are equal, round, and reactive to light.  Neck:     Thyroid: No thyromegaly.     Vascular: No JVD.     Trachea: No tracheal deviation.  Cardiovascular:     Rate and Rhythm: Normal rate and regular rhythm.     Heart sounds: Normal heart sounds. No murmur heard.  No friction rub. No gallop.   Pulmonary:     Effort: Pulmonary effort is normal. No respiratory distress.     Breath sounds: Normal breath sounds. No wheezing or rales.  Chest:     Chest wall: No tenderness.  Abdominal:     Palpations: Abdomen is soft.     Tenderness: There is no abdominal tenderness. There is no guarding.  Musculoskeletal:        General: Normal range of motion.     Cervical back: Normal range of motion and neck supple.  Lymphadenopathy:     Cervical: No cervical adenopathy.  Skin:    General: Skin is warm and dry.  Neurological:     Mental Status:  He is alert and oriented to person, place, and time.     Cranial Nerves: No cranial nerve deficit.  Psychiatric:        Behavior: Behavior normal.        Thought Content: Thought content normal.        Judgment: Judgment normal.      LABS: No results found for this or any previous visit (from the past 2160 hour(s)).    Assessment/Plan: 1. Encounter for general adult medical examination with abnormal findings Up to date on phm, labs given to patient on paper.   2. Essential hypertension, benign Continue to monitor pressure, patient takes A BP medicine as needed.    3. Gastroesophageal reflux disease without esophagitis Continue pantoprazole as directed.   4. Chronic fatigue and malaise Ongoing issues, continue to monitor, reassess Chest CT for nodules.   5. Pulmonary nodules Recheck Nodules.  - CT Chest W Contrast; Future  6. Chronic right-sided low back pain with sciatica, sciatica laterality unspecified Continue to use methadone as before.   7. Dysuria - UA/M w/rflx Culture,  Routine  General Counseling: Jose Cuevas verbalizes understanding of the findings of todays visit and agrees with plan of treatment. I have discussed any further diagnostic evaluation that may be needed or ordered today. We also reviewed his medications today. he has been encouraged to call the office with any questions or concerns that should arise related to todays visit.   Orders Placed This Encounter  Procedures  . UA/M w/rflx Culture, Routine    No orders of the defined types were placed in this encounter.   Time spent: 30 Minutes   This patient was seen by Orson Gear AGNP-C in Collaboration with Dr Lavera Guise as a part of collaborative care agreement    Jose Cuevas AGNP-C Internal Medicine

## 2020-04-26 ENCOUNTER — Other Ambulatory Visit
Admission: RE | Admit: 2020-04-26 | Discharge: 2020-04-26 | Disposition: A | Discharge status: Home / Self Care | Payer: Medicare Other | Source: Ambulatory Visit | Attending: Adult Health | Admitting: Adult Health

## 2020-04-26 DIAGNOSIS — E0781 Sick-euthyroid syndrome: Secondary | ICD-10-CM | POA: Insufficient documentation

## 2020-04-26 DIAGNOSIS — R5381 Other malaise: Secondary | ICD-10-CM | POA: Insufficient documentation

## 2020-04-26 DIAGNOSIS — R5383 Other fatigue: Secondary | ICD-10-CM | POA: Insufficient documentation

## 2020-04-26 DIAGNOSIS — E756 Lipid storage disorder, unspecified: Secondary | ICD-10-CM | POA: Diagnosis present

## 2020-04-26 DIAGNOSIS — Z125 Encounter for screening for malignant neoplasm of prostate: Secondary | ICD-10-CM | POA: Diagnosis not present

## 2020-04-26 LAB — CBC WITH DIFFERENTIAL/PLATELET
Abs Immature Granulocytes: 0.02 10*3/uL (ref 0.00–0.07)
Basophils Absolute: 0.2 10*3/uL — ABNORMAL HIGH (ref 0.0–0.1)
Basophils Relative: 2 %
Eosinophils Absolute: 0.3 10*3/uL (ref 0.0–0.5)
Eosinophils Relative: 5 %
HCT: 41.6 % (ref 39.0–52.0)
Hemoglobin: 14.3 g/dL (ref 13.0–17.0)
Immature Granulocytes: 0 %
Lymphocytes Relative: 42 %
Lymphs Abs: 3 10*3/uL (ref 0.7–4.0)
MCH: 31.5 pg (ref 26.0–34.0)
MCHC: 34.4 g/dL (ref 30.0–36.0)
MCV: 91.6 fL (ref 80.0–100.0)
Monocytes Absolute: 0.4 10*3/uL (ref 0.1–1.0)
Monocytes Relative: 6 %
Neutro Abs: 3.2 10*3/uL (ref 1.7–7.7)
Neutrophils Relative %: 45 %
Platelets: 238 10*3/uL (ref 150–400)
RBC: 4.54 MIL/uL (ref 4.22–5.81)
RDW: 14 % (ref 11.5–15.5)
WBC: 7.1 10*3/uL (ref 4.0–10.5)
nRBC: 0 % (ref 0.0–0.2)

## 2020-04-26 LAB — COMPREHENSIVE METABOLIC PANEL
ALT: 8 U/L (ref 0–44)
AST: 13 U/L — ABNORMAL LOW (ref 15–41)
Albumin: 3.4 g/dL — ABNORMAL LOW (ref 3.5–5.0)
Alkaline Phosphatase: 70 U/L (ref 38–126)
Anion gap: 7 (ref 5–15)
BUN: 13 mg/dL (ref 6–20)
CO2: 23 mmol/L (ref 22–32)
Calcium: 8.9 mg/dL (ref 8.9–10.3)
Chloride: 110 mmol/L (ref 98–111)
Creatinine, Ser: 1.02 mg/dL (ref 0.61–1.24)
GFR calc Af Amer: >60 mL/min (ref >60)
GFR calc non Af Amer: >60 mL/min (ref >60)
Glucose, Bld: 81 mg/dL (ref 70–99)
Potassium: 4.2 mmol/L (ref 3.5–5.1)
Sodium: 140 mmol/L (ref 135–145)
Total Bilirubin: 0.4 mg/dL (ref 0.3–1.2)
Total Protein: 6.6 g/dL (ref 6.5–8.1)

## 2020-04-26 LAB — LIPID PANEL
Cholesterol: 176 mg/dL (ref 0–200)
HDL: 37 mg/dL — ABNORMAL LOW (ref >40)
LDL Cholesterol: 116 mg/dL — ABNORMAL HIGH (ref 0–99)
Total CHOL/HDL Ratio: 4.8 RATIO
Triglycerides: 115 mg/dL (ref <150)
VLDL: 23 mg/dL (ref 0–40)

## 2020-04-26 LAB — TSH: TSH: 1.119 u[IU]/mL (ref 0.350–4.500)

## 2020-04-26 LAB — PSA: Prostatic Specific Antigen: 0.45 ng/mL (ref 0.00–4.00)

## 2020-04-29 ENCOUNTER — Telehealth: Payer: Self-pay

## 2020-04-29 NOTE — Telephone Encounter (Signed)
Left message advising pt I rescheduled his appointment in office and advised of new date and ct date on voice mail. Beth

## 2020-05-09 ENCOUNTER — Telehealth: Payer: Self-pay

## 2020-05-09 ENCOUNTER — Ambulatory Visit: Payer: Medicare Other | Admitting: Adult Health

## 2020-05-09 NOTE — Telephone Encounter (Signed)
Confirmed appt with pts wife for office visit on 8/11

## 2020-05-10 ENCOUNTER — Other Ambulatory Visit: Payer: Self-pay

## 2020-05-10 ENCOUNTER — Ambulatory Visit
Admission: RE | Admit: 2020-05-10 | Discharge: 2020-05-10 | Disposition: A | Discharge status: Home / Self Care | Payer: Medicare Other | Source: Ambulatory Visit | Attending: Adult Health | Admitting: Adult Health

## 2020-05-10 DIAGNOSIS — R918 Other nonspecific abnormal finding of lung field: Secondary | ICD-10-CM | POA: Insufficient documentation

## 2020-05-10 MED ORDER — IOHEXOL 300 MG/ML  SOLN
60.0000 mL | Freq: Once | INTRAMUSCULAR | Status: AC | PRN
  Administered 2020-05-10: 60 mL via INTRAVENOUS

## 2020-05-11 ENCOUNTER — Encounter: Payer: Self-pay | Admitting: Nurse Practitioner

## 2020-05-11 ENCOUNTER — Ambulatory Visit (INDEPENDENT_AMBULATORY_CARE_PROVIDER_SITE_OTHER): Payer: Medicare Other | Admitting: Nurse Practitioner

## 2020-05-11 VITALS — BP 148/65 | HR 57 | Temp 96.4°F | Resp 16 | Ht 68.0 in | Wt 124.6 lb

## 2020-05-11 DIAGNOSIS — N261 Atrophy of kidney (terminal): Secondary | ICD-10-CM | POA: Diagnosis not present

## 2020-05-11 DIAGNOSIS — R918 Other nonspecific abnormal finding of lung field: Secondary | ICD-10-CM

## 2020-05-11 DIAGNOSIS — I700 Atherosclerosis of aorta: Secondary | ICD-10-CM | POA: Diagnosis not present

## 2020-05-11 DIAGNOSIS — N200 Calculus of kidney: Secondary | ICD-10-CM

## 2020-05-11 DIAGNOSIS — L237 Allergic contact dermatitis due to plants, except food: Secondary | ICD-10-CM

## 2020-05-11 MED ORDER — TAMSULOSIN HCL 0.4 MG PO CAPS: 0.4000 mg | ORAL_CAPSULE | Freq: Every day | ORAL | 2 refills | Status: DC

## 2020-05-11 MED ORDER — PREDNISONE 10 MG (21) PO TBPK: ORAL_TABLET | ORAL | 0 refills | Status: DC

## 2020-05-11 MED ORDER — TRIAMCINOLONE ACETONIDE 0.025 % EX CREA: 1.0000 "application " | TOPICAL_CREAM | Freq: Two times a day (BID) | CUTANEOUS | 2 refills | Status: DC

## 2020-05-11 NOTE — Progress Notes (Signed)
[facility] [place], [place] [address]  Internal MEDICINE  Office Visit Note  Patient Name: Jose Cuevas  [id number]  [id number]  Date of Service: 05/28/2020  Chief Complaint  Patient presents with  . Follow-up    test results, poison oak, dizziness on 05/06/20  . Quality Metric Gaps    TDAP, HIV screening, HepC    The patient is here for follow up of recent chest CT. He had been having chest wall tenderness. His chest ct showed 1. No abnormality to explain the patient's left posterior chest wall Pain. 2. Persistent moderate-sized loculated left pleural effusion with mild adjacent opacities likely atelectasis. 3. Stable borderline enlarged lymph nodes in the mediastinum and bilateral hilar regions, likely reactive. 4. Stable subcentimeter pulmonary nodules in the right lung. Recommend follow-up noncontrast chest CT in 1 year to confirm Stability. 5. Atrophic left kidney with large calculus at the renal pelvis. No hydronephrosis. 6. Emphysema and aortic atherosclerosis. The patient states that he has pain all through his back and joints. Still has chest discomfort. He sees pain management on routine basis.       Current Medication: Outpatient Encounter Medications as of 05/11/2020  Medication Sig Note  . Aspirin-Caffeine (BC FAST PAIN RELIEF ARTHRITIS PO) Take by mouth.   . folic acid (FOLVITE) 1 MG tablet Take 1 mg by mouth daily.   . methadone (DOLOPHINE) 10 MG tablet Take 10 mg by mouth 4 (four) times daily.    . methotrexate 50 MG/2ML injection Inject 50 mg/m2 into the vein once. 0.16mls once every 2 weeks 06/19/2019: Wednesday   . pantoprazole (PROTONIX) 40 MG tablet Take 40 mg by mouth 2 (two) times daily.   . tamsulosin (FLOMAX) 0.4 MG CAPS capsule Take 1 capsule (0.4 mg total) by mouth daily.   Marland Kitchen triamcinolone (KENALOG) 0.025 % cream Apply 1 application topically 2 (two) times daily.   . vitamin B-12 (CYANOCOBALAMIN) 1000 MCG tablet Take 1,000 mcg by  mouth daily.   . [DISCONTINUED] esomeprazole (NEXIUM) 40 MG capsule Take 40 mg by mouth daily at 12 noon.   . [DISCONTINUED] predniSONE (STERAPRED UNI-PAK 21 TAB) 10 MG (21) TBPK tablet 6 day taper - take by mouth as directed for 6 days    No facility-administered encounter medications on file as of 05/11/2020.    Surgical History: Past Surgical History:  Procedure Laterality Date  . BACK SURGERY    . COLONOSCOPY WITH PROPOFOL N/A 11/11/2015   Procedure: COLONOSCOPY WITH PROPOFOL;  Surgeon: [name], MD;  Location: [facility] ENDOSCOPY;  Service: Endoscopy;  Laterality: N/A;  . COLONOSCOPY WITH PROPOFOL N/A 12/02/2015   Procedure: COLONOSCOPY WITH PROPOFOL;  Surgeon: [name], MD;  Location: [facility] ENDOSCOPY;  Service: Endoscopy;  Laterality: N/A;  . COLONOSCOPY WITH PROPOFOL N/A 09/16/2019   Procedure: COLONOSCOPY WITH PROPOFOL;  Surgeon: Toledo, Benay Pike, MD;  Location: [facility] ENDOSCOPY;  Service: Gastroenterology;  Laterality: N/A;  . ESOPHAGOGASTRODUODENOSCOPY (EGD) WITH PROPOFOL N/A 11/11/2015   Procedure: ESOPHAGOGASTRODUODENOSCOPY (EGD) WITH PROPOFOL;  Surgeon: [name], MD;  Location: [facility] ENDOSCOPY;  Service: Endoscopy;  Laterality: N/A;  . ESOPHAGOGASTRODUODENOSCOPY (EGD) WITH PROPOFOL N/A 12/02/2015   Procedure: ESOPHAGOGASTRODUODENOSCOPY (EGD) WITH PROPOFOL;  Surgeon: [name], MD;  Location: [facility] ENDOSCOPY;  Service: Endoscopy;  Laterality: N/A;  . ESOPHAGOGASTRODUODENOSCOPY (EGD) WITH PROPOFOL N/A 01/20/2016   Procedure: ESOPHAGOGASTRODUODENOSCOPY (EGD) WITH PROPOFOL;  Surgeon: [name], MD;  Location: [facility] ENDOSCOPY;  Service: Endoscopy;  Laterality: N/A;  . ESOPHAGOGASTRODUODENOSCOPY (EGD)  WITH PROPOFOL N/A 09/16/2019   Procedure: ESOPHAGOGASTRODUODENOSCOPY (EGD) WITH PROPOFOL;  Surgeon: Toledo, Benay Pike, MD;  Location: [facility] ENDOSCOPY;  Service: Gastroenterology;  Laterality: N/A;  . KNEE RECONSTRUCTION Left     Medical History: Past  Medical History:  Diagnosis Date  . Back pain   . Collagen vascular disease (HCC)    Rhematoid Arthritis hx.  . Constipation, chronic   . DDD (degenerative disc disease), cervical   . History of duodenal ulcer   . History of kidney stones   . Hyperlipidemia   . Hypertension   . Low kidney function    LT Kidney non-functioning  . Nondiabetic gastroparesis   . Peripheral blood vessel disorder ([place])   . Rheumatoid arthritis with rheumatoid factor (HCC)   . Sleep apnea   . Weight loss     Family History: Family History  Problem Relation Age of Onset  . Diabetes Mother   . Diabetes Father   . Heart attack Maternal Grandmother     Social History   Socioeconomic History  . Marital status: Married    Spouse name: Not on file  . Number of children: Not on file  . Years of education: Not on file  . Highest education level: Not on file  Occupational History  . Not on file  Tobacco Use  . Smoking status: Current Some Day Smoker    Years: 2.00    Types: Cigars  . Smokeless tobacco: Never Used  . Tobacco comment: Occasional  3 a day  Vaping Use  . Vaping Use: Never used  Substance and Sexual Activity  . Alcohol use: No  . Drug use: No  . Sexual activity: Yes    Birth control/protection: None  Other Topics Concern  . Not on file  Social History Narrative  . Not on file   Social Determinants of Health   Financial Resource Strain:   . Difficulty of Paying Living Expenses: Not on file  Food Insecurity:   . Worried About Charity fundraiser in the Last Year: Not on file  . Ran Out of Food in the Last Year: Not on file  Transportation Needs:   . Lack of Transportation (Medical): Not on file  . Lack of Transportation (Non-Medical): Not on file  Physical Activity:   . Days of Exercise per Week: Not on file  . Minutes of Exercise per Session: Not on file  Stress:   . Feeling of Stress : Not on file  Social Connections:   . Frequency of Communication with Friends and  Family: Not on file  . Frequency of Social Gatherings with Friends and Family: Not on file  . Attends Religious Services: Not on file  . Active Member of Clubs or Organizations: Not on file  . Attends Archivist Meetings: Not on file  . Marital Status: Not on file  Intimate Partner Violence:   . Fear of Current or Ex-Partner: Not on file  . Emotionally Abused: Not on file  . Physically Abused: Not on file  . Sexually Abused: Not on file      Review of Systems  Constitutional: Positive for activity change and fatigue. Negative for chills and unexpected weight change.  HENT: Negative for congestion, postnasal drip, rhinorrhea, sneezing and sore throat.   Respiratory: Positive for chest tightness and shortness of breath. Negative for cough.   Cardiovascular: Positive for chest pain. Negative for palpitations.  Gastrointestinal: Negative for abdominal pain, constipation, diarrhea, nausea and vomiting.  Endocrine: Negative  for cold intolerance, heat intolerance, polydipsia and polyuria.  Genitourinary: Positive for flank pain and hematuria. Negative for dysuria and frequency.  Musculoskeletal: Positive for arthralgias and myalgias. Negative for back pain, joint swelling and neck pain.  Skin: Negative for rash.  Allergic/Immunologic: Negative for environmental allergies.  Neurological: Negative for dizziness, tremors, numbness and headaches.  Hematological: Negative for adenopathy. Does not bruise/bleed easily.  Psychiatric/Behavioral: Negative for behavioral problems (Depression), sleep disturbance and suicidal ideas. The patient is not nervous/anxious.     Today's Vitals   05/11/20 1556  BP: (!) 148/65  Pulse: (!) 57  Resp: 16  Temp: (!) 96.4 F (35.8 C)  SpO2: 98%  Weight: 124 lb 9.6 oz (56.5 kg)  Height: 5\' 8"  (1.727 m)   Body mass index is 18.95 kg/m.  Physical Exam Vitals and nursing note reviewed.  Constitutional:      General: He is not in acute distress.     Appearance: Normal appearance. He is well-developed. He is ill-appearing. He is not diaphoretic.  HENT:     Head: Normocephalic and atraumatic.     Nose: Nose normal.     Mouth/Throat:     Pharynx: No oropharyngeal exudate.  Eyes:     Pupils: Pupils are equal, round, and reactive to light.  Neck:     Thyroid: No thyromegaly.     Vascular: No carotid bruit or JVD.     Trachea: No tracheal deviation.  Cardiovascular:     Rate and Rhythm: Normal rate and regular rhythm.     Heart sounds: Normal heart sounds. No murmur heard.  No friction rub. No gallop.   Pulmonary:     Effort: Pulmonary effort is normal. No respiratory distress.     Breath sounds: Normal breath sounds. No wheezing or rales.     Comments: Breath sounds are clear but diminished throughout the lung fields.  Chest:     Chest wall: No tenderness.  Abdominal:     General: Bowel sounds are normal.     Palpations: Abdomen is soft.     Tenderness: There is no abdominal tenderness.  Musculoskeletal:        General: Normal range of motion.     Cervical back: Normal range of motion and neck supple.     Comments: Chronic, generalized oint tenderness.   Lymphadenopathy:     Cervical: No cervical adenopathy.  Skin:    General: Skin is warm and dry.     Findings: Rash present.     Comments: Blister-like rash on the hands, arms, and lower legs.   Neurological:     General: No focal deficit present.     Mental Status: He is alert and oriented to person, place, and time.     Cranial Nerves: No cranial nerve deficit.  Psychiatric:        Mood and Affect: Mood normal.        Behavior: Behavior normal.        Thought Content: Thought content normal.        Judgment: Judgment normal.    Assessment/Plan: 1. Pulmonary nodules Reviewed recent chest CT with the patient. Shows stable subcentimeter pulmonary nodules in the right lung. Recommend follow-up noncontrast chest CT in 1 year.  2. Atrophic kidney, acquired The patient  has left atrophic left kidney with large calculus at the renal pelvis. No hydronephrosis. Refer to urology for further evaluation.  - Ambulatory referral to Urology  3. Renal calculus, left The patient has left atrophic left  kidney with large calculus at the renal pelvis. No hydronephrosis. Add tamsulosin 0.4mg  capsules daily.Refer to urology for further evaluation.  - Ambulatory referral to Urology - tamsulosin (FLOMAX) 0.4 MG CAPS capsule; Take 1 capsule (0.4 mg total) by mouth daily.  Dispense: 30 capsule; Refill: 2  4. Aortic atherosclerosis (HCC) Abdominal aortic atherosclerosis present on recent chest CT. Will get vascular ultrasound of abdominal vasculature.  - VAS US AORTA/IVC/ILIACS; Future  5. Poison oak dermatitis Triamcinolone cream may be applied to effected areas twice daily as needed for itching and irritation.  - triamcinolone (KENALOG) 0.025 % cream; Apply 1 application topically 2 (two) times daily.  Dispense: 80 g; Refill: 2  General Counseling: Shahil verbalizes understanding of the findings of todays visit and agrees with plan of treatment. I have discussed any further diagnostic evaluation that may be needed or ordered today. We also reviewed his medications today. he has been encouraged to call the office with any questions or concerns that should arise related to todays visit.  This patient was seen by Leretha Pol FNP Collaboration with Dr Lavera Guise as a part of collaborative care agreement  Orders Placed This Encounter  Procedures  . Ambulatory referral to Urology  . VAS US AORTA/IVC/ILIACS    Meds ordered this encounter  Medications  . DISCONTD: predniSONE (STERAPRED UNI-PAK 21 TAB) 10 MG (21) TBPK tablet    Sig: 6 day taper - take by mouth as directed for 6 days    Dispense:  21 tablet    Refill:  0    Order Specific Question:   Supervising Provider    Answer:   Lavera Guise [place]  . tamsulosin (FLOMAX) 0.4 MG CAPS capsule    Sig: Take 1 capsule  (0.4 mg total) by mouth daily.    Dispense:  30 capsule    Refill:  2    Order Specific Question:   Supervising Provider    Answer:   Lavera Guise [[id number]]  . triamcinolone (KENALOG) 0.025 % cream    Sig: Apply 1 application topically 2 (two) times daily.    Dispense:  80 g    Refill:  2    Order Specific Question:   Supervising Provider    Answer:   Lavera Guise [[id number]]    Total time spent: 30 Minutes   Time spent includes review of chart, medications, test results, and follow up plan with the patient.      Dr Lavera Guise Internal medicine

## 2020-05-17 ENCOUNTER — Encounter: Payer: Self-pay | Admitting: Urology

## 2020-05-17 NOTE — Progress Notes (Signed)
Pt should see dsk

## 2020-05-19 ENCOUNTER — Ambulatory Visit (INDEPENDENT_AMBULATORY_CARE_PROVIDER_SITE_OTHER): Payer: Medicare Other | Admitting: Internal Medicine

## 2020-05-19 ENCOUNTER — Other Ambulatory Visit: Payer: Self-pay

## 2020-05-19 ENCOUNTER — Encounter: Payer: Self-pay | Admitting: Internal Medicine

## 2020-05-19 ENCOUNTER — Telehealth: Payer: Self-pay

## 2020-05-19 VITALS — BP 140/70 | HR 76 | Temp 98.2°F | Resp 16 | Ht 66.0 in | Wt 126.0 lb

## 2020-05-19 DIAGNOSIS — R918 Other nonspecific abnormal finding of lung field: Secondary | ICD-10-CM

## 2020-05-19 DIAGNOSIS — J90 Pleural effusion, not elsewhere classified: Secondary | ICD-10-CM

## 2020-05-19 DIAGNOSIS — M0579 Rheumatoid arthritis with rheumatoid factor of multiple sites without organ or systems involvement: Secondary | ICD-10-CM | POA: Diagnosis not present

## 2020-05-19 DIAGNOSIS — I10 Essential (primary) hypertension: Secondary | ICD-10-CM

## 2020-05-19 DIAGNOSIS — R0602 Shortness of breath: Secondary | ICD-10-CM

## 2020-05-19 MED ORDER — LOSARTAN POTASSIUM-HCTZ 100-25 MG PO TABS: 1.0000 | ORAL_TABLET | Freq: Every day | ORAL | 4 refills | Status: DC

## 2020-05-19 MED ORDER — LOSARTAN POTASSIUM-HCTZ 100-25 MG PO TABS: 1.0000 | ORAL_TABLET | Freq: Every day | ORAL | 0 refills | Status: DC

## 2020-05-19 NOTE — Progress Notes (Signed)
[facility] [place], [place] [address]  Pulmonary Sleep Medicine   Office Visit Note  Patient Name: Jose Cuevas DOB: August 02, 1961 MRN [id number]  Date of Service: 05/19/2020  Complaints/HPI: Here for follow up of fluid seen on CT scan. Patient had a follow up scan done and this reveals presence of pleural effusion.  I pulled up the previous CT scan and essentially the CT shows actually improved aeration of the left lung with maybe a slight decrease in the pleural effusion.  He had a thoracentesis done previously he says that he is having some increasing shortness of breath and some symptoms of pain in the left side of his chest.  He has had a pretty thorough work-up done likely this effusion is related to the underlying rheumatoid arthritis.  CT scan also did show presence of nodules which appear to be stable.  Discussed with him the options that are available including evaluation by CT surgery right now he wants to defer the evaluation by CT surgery as does his wife and try to proceed with a thoracentesis.  I will go ahead and get him set up for that.  ROS  General: (-) fever, (-) chills, (-) night sweats, (-) weakness Skin: (-) rashes, (-) itching,. Eyes: (-) visual changes, (-) redness, (-) itching. Nose and Sinuses: (-) nasal stuffiness or itchiness, (-) postnasal drip, (-) nosebleeds, (-) sinus trouble. Mouth and Throat: (-) sore throat, (-) hoarseness. Neck: (-) swollen glands, (-) enlarged thyroid, (-) neck pain. Respiratory: + cough, (-) bloody sputum, + shortness of breath, - wheezing. Cardiovascular: - ankle swelling, (-) chest pain. Lymphatic: (-) lymph node enlargement. Neurologic: (-) numbness, (-) tingling. Psychiatric: (-) anxiety, (-) depression   Current Medication: Outpatient Encounter Medications as of 05/19/2020  Medication Sig Note  . Aspirin-Caffeine (BC FAST PAIN RELIEF ARTHRITIS PO) Take by mouth.   . folic acid (FOLVITE) 1 MG tablet  Take 1 mg by mouth daily.   Marland Kitchen losartan-hydrochlorothiazide (HYZAAR) 100-25 MG tablet Take 1 tablet by mouth daily.   . methadone (DOLOPHINE) 10 MG tablet Take 10 mg by mouth 4 (four) times daily.    . methotrexate 50 MG/2ML injection Inject 50 mg/m2 into the vein once. 0.53mls once every 2 weeks 06/19/2019: Wednesday   . pantoprazole (PROTONIX) 40 MG tablet Take 40 mg by mouth 2 (two) times daily.   . predniSONE (STERAPRED UNI-PAK 21 TAB) 10 MG (21) TBPK tablet 6 day taper - take by mouth as directed for 6 days   . tamsulosin (FLOMAX) 0.4 MG CAPS capsule Take 1 capsule (0.4 mg total) by mouth daily.   Marland Kitchen triamcinolone (KENALOG) 0.025 % cream Apply 1 application topically 2 (two) times daily.   . vitamin B-12 (CYANOCOBALAMIN) 1000 MCG tablet Take 1,000 mcg by mouth daily.   . [DISCONTINUED] losartan-hydrochlorothiazide (HYZAAR) 100-25 MG tablet Take 1 tablet by mouth daily as needed.    No facility-administered encounter medications on file as of 05/19/2020.    Surgical History: Past Surgical History:  Procedure Laterality Date  . BACK SURGERY    . COLONOSCOPY WITH PROPOFOL N/A 11/11/2015   Procedure: COLONOSCOPY WITH PROPOFOL;  Surgeon: [name], MD;  Location: [facility] ENDOSCOPY;  Service: Endoscopy;  Laterality: N/A;  . COLONOSCOPY WITH PROPOFOL N/A 12/02/2015   Procedure: COLONOSCOPY WITH PROPOFOL;  Surgeon: [name], MD;  Location: [facility] ENDOSCOPY;  Service: Endoscopy;  Laterality: N/A;  . COLONOSCOPY WITH PROPOFOL N/A 09/16/2019   Procedure: COLONOSCOPY WITH PROPOFOL;  Surgeon:  Toledo, Benay Pike, MD;  Location: [facility] ENDOSCOPY;  Service: Gastroenterology;  Laterality: N/A;  . ESOPHAGOGASTRODUODENOSCOPY (EGD) WITH PROPOFOL N/A 11/11/2015   Procedure: ESOPHAGOGASTRODUODENOSCOPY (EGD) WITH PROPOFOL;  Surgeon: [name], MD;  Location: [facility] ENDOSCOPY;  Service: Endoscopy;  Laterality: N/A;  . ESOPHAGOGASTRODUODENOSCOPY (EGD) WITH PROPOFOL N/A 12/02/2015   Procedure:  ESOPHAGOGASTRODUODENOSCOPY (EGD) WITH PROPOFOL;  Surgeon: [name], MD;  Location: [facility] ENDOSCOPY;  Service: Endoscopy;  Laterality: N/A;  . ESOPHAGOGASTRODUODENOSCOPY (EGD) WITH PROPOFOL N/A 01/20/2016   Procedure: ESOPHAGOGASTRODUODENOSCOPY (EGD) WITH PROPOFOL;  Surgeon: [name], MD;  Location: [facility] ENDOSCOPY;  Service: Endoscopy;  Laterality: N/A;  . ESOPHAGOGASTRODUODENOSCOPY (EGD) WITH PROPOFOL N/A 09/16/2019   Procedure: ESOPHAGOGASTRODUODENOSCOPY (EGD) WITH PROPOFOL;  Surgeon: Toledo, Benay Pike, MD;  Location: [facility] ENDOSCOPY;  Service: Gastroenterology;  Laterality: N/A;  . KNEE RECONSTRUCTION Left     Medical History: Past Medical History:  Diagnosis Date  . Back pain   . Collagen vascular disease (HCC)    Rhematoid Arthritis hx.  . Constipation, chronic   . DDD (degenerative disc disease), cervical   . History of duodenal ulcer   . History of kidney stones   . Hyperlipidemia   . Hypertension   . Low kidney function    LT Kidney non-functioning  . Nondiabetic gastroparesis   . Peripheral blood vessel disorder ([place])   . Rheumatoid arthritis with rheumatoid factor (HCC)   . Sleep apnea   . Weight loss     Family History: Family History  Problem Relation Age of Onset  . Diabetes Mother   . Diabetes Father   . Heart attack Maternal Grandmother     Social History: Social History   Socioeconomic History  . Marital status: Married    Spouse name: Not on file  . Number of children: Not on file  . Years of education: Not on file  . Highest education level: Not on file  Occupational History  . Not on file  Tobacco Use  . Smoking status: Current Some Day Smoker    Years: 2.00    Types: Cigars  . Smokeless tobacco: Never Used  . Tobacco comment: Occasional  3 a day  Vaping Use  . Vaping Use: Never used  Substance and Sexual Activity  . Alcohol use: No  . Drug use: No  . Sexual activity: Not on file  Other Topics Concern  . Not on file   Social History Narrative  . Not on file   Social Determinants of Health   Financial Resource Strain:   . Difficulty of Paying Living Expenses: Not on file  Food Insecurity:   . Worried About Charity fundraiser in the Last Year: Not on file  . Ran Out of Food in the Last Year: Not on file  Transportation Needs:   . Lack of Transportation (Medical): Not on file  . Lack of Transportation (Non-Medical): Not on file  Physical Activity:   . Days of Exercise per Week: Not on file  . Minutes of Exercise per Session: Not on file  Stress:   . Feeling of Stress : Not on file  Social Connections:   . Frequency of Communication with Friends and Family: Not on file  . Frequency of Social Gatherings with Friends and Family: Not on file  . Attends Religious Services: Not on file  . Active Member of Clubs or Organizations: Not on file  . Attends Archivist Meetings: Not on file  . Marital Status: Not on file  Intimate Partner Violence:   . Fear of Current or Ex-Partner: Not on file  . Emotionally Abused: Not on file  . Physically Abused: Not on file  . Sexually Abused: Not on file    Vital Signs: Blood pressure 140/70, pulse 76, temperature 98.2 F (36.8 C), resp. rate 16, height 5\' 6"  (1.676 m), weight 126 lb (57.2 kg), SpO2 98 %.  Examination: General Appearance: The patient is well-developed, well-nourished, and in no distress. Skin: Gross inspection of skin unremarkable. Head: normocephalic, no gross deformities. Eyes: no gross deformities noted. ENT: ears appear grossly normal no exudates. Neck: Supple. No thyromegaly. No LAD. Respiratory: Diminished breath sounds on the left side with some crackling noted also. Cardiovascular: Normal S1 and S2 without murmur or rub. Extremities: No cyanosis. pulses are equal. Neurologic: Alert and oriented. No involuntary movements.  LABS: Recent Results (from the past 2160 hour(s))  CBC with Differential/Platelet     Status:  Abnormal   Collection Time: 04/26/20  9:50 AM  Result Value Ref Range   WBC 7.1 4.0 - 10.5 K/uL   RBC 4.54 4.22 - 5.81 MIL/uL   Hemoglobin 14.3 13.0 - 17.0 g/dL   HCT 41.6 39 - 52 %   MCV 91.6 80.0 - 100.0 fL   MCH 31.5 26.0 - 34.0 pg   MCHC 34.4 30.0 - 36.0 g/dL   RDW 14.0 11.5 - 15.5 %   Platelets 238 150 - 400 K/uL   nRBC 0.0 0.0 - 0.2 %   Neutrophils Relative % 45 %   Neutro Abs 3.2 1.7 - 7.7 K/uL   Lymphocytes Relative 42 %   Lymphs Abs 3.0 0.7 - 4.0 K/uL   Monocytes Relative 6 %   Monocytes Absolute 0.4 0 - 1 K/uL   Eosinophils Relative 5 %   Eosinophils Absolute 0.3 0 - 0 K/uL   Basophils Relative 2 %   Basophils Absolute 0.2 (H) 0 - 0 K/uL   Immature Granulocytes 0 %   Abs Immature Granulocytes 0.02 0.00 - 0.07 K/uL    Comment: Performed at [facility], [address] [place], [place] [address]  Lipid panel     Status: Abnormal   Collection Time: 04/26/20  9:50 AM  Result Value Ref Range   Cholesterol 176 0 - 200 mg/dL   Triglycerides 115 <150 mg/dL   HDL 37 (L) >40 mg/dL   Total CHOL/HDL Ratio 4.8 RATIO   VLDL 23 0 - 40 mg/dL   LDL Cholesterol 116 (H) 0 - 99 mg/dL    Comment:        Total Cholesterol/HDL:CHD Risk Coronary Heart Disease Risk Table                     Men   Women  1/2 Average Risk   3.4   3.3  Average Risk       5.0   4.4  2 X Average Risk   9.6   7.1  3 X Average Risk  23.4   11.0        Use the calculated Patient Ratio above and the CHD Risk Table to determine the patient's CHD Risk.        ATP III CLASSIFICATION (LDL):  <100     mg/dL   Optimal  100-129  mg/dL   Near or Above                    Optimal  130-159  mg/dL  Borderline  160-189  mg/dL   High  >190     mg/dL   Very High Performed at [facility], [address] [place], [place] [address]   TSH     Status: None   Collection Time: 04/26/20  9:50 AM  Result Value Ref Range   TSH 1.119 0.350 - 4.500 uIU/mL    Comment: Performed by a 3rd Generation  assay with a functional sensitivity of <=0.01 uIU/mL. Performed at [facility], [address] [place], [place] [address]   Comprehensive metabolic panel     Status: Abnormal   Collection Time: 04/26/20  9:50 AM  Result Value Ref Range   Sodium 140 135 - 145 mmol/L   Potassium 4.2 3.5 - 5.1 mmol/L   Chloride 110 98 - 111 mmol/L   CO2 23 22 - 32 mmol/L   Glucose, Bld 81 70 - 99 mg/dL    Comment: Glucose reference range applies only to samples taken after fasting for at least 8 hours.   BUN 13 6 - 20 mg/dL   Creatinine, Ser 1.02 0.61 - 1.24 mg/dL   Calcium 8.9 8.9 - 10.3 mg/dL   Total Protein 6.6 6.5 - 8.1 g/dL   Albumin 3.4 (L) 3.5 - 5.0 g/dL   AST 13 (L) 15 - 41 U/L   ALT 8 0 - 44 U/L   Alkaline Phosphatase 70 38 - 126 U/L   Total Bilirubin 0.4 0.3 - 1.2 mg/dL   GFR calc non Af Amer >60 >60 mL/min   GFR calc Af Amer >60 >60 mL/min   Anion gap 7 5 - 15    Comment: Performed at [facility], [address], [place], [place] [address]  PSA     Status: None   Collection Time: 04/26/20  9:50 AM  Result Value Ref Range   Prostatic Specific Antigen 0.45 0.00 - 4.00 ng/mL    Comment: (NOTE) While PSA levels of <=4.0 ng/ml are reported as reference range, some men with levels below 4.0 ng/ml can have prostate cancer and many men with PSA above 4.0 ng/ml do not have prostate cancer.  Other tests such as free PSA, age specific reference ranges, PSA velocity and PSA doubling time may be helpful especially in men less than 63 years old. Performed at [facility], [place] [address] [place], [place] [address]     Radiology: CT Chest W Contrast  Result Date: 05/11/2020 CLINICAL DATA:  Pt states he has pain under left shoulder blade, radiates to right shoulder blade wrapping around to chest, this has been happening about 3 times per month x43mos. EXAM: CT CHEST WITH CONTRAST TECHNIQUE: Multidetector CT imaging of the chest was performed during intravenous contrast  administration. CONTRAST:  68mL OMNIPAQUE IOHEXOL 300 MG/ML  SOLN COMPARISON:  CT chest 05/28/2019 FINDINGS: Cardiovascular: Aortic atherosclerosis without aneurysm. Normal heart size. No pericardial effusion. While this study was not optimized for the evaluation of pulmonary embolism in there is no evidence of large central PE. Mediastinum/Nodes: Stable borderline enlarged lymph nodes in the mediastinum and bilateral hilar regions. For example a right hilar lymph node measures 1.0 cm in short axis. No axillary adenopathy. Thyroid gland, trachea, and esophagus demonstrate no significant findings. Lungs/Pleura: Central airways are patent. Moderate upper lobe predominant paraseptal and centrilobular emphysema. Persistent moderate-sized loculated left pleural effusion. With mild adjacent opacities likely atelectasis. Chronic fibrotic changes/scarring bilaterally. No new focal consolidation. Stable 5 mm nodule in the right lung apex (series 2,  image 27). Stable 7 mm nodule in the right middle lobe (series 2, image 86). Upper Abdomen: Atrophic left kidney with large calculus at the renal pelvis. No hydronephrosis. Musculoskeletal: No chest wall abnormality. No acute or significant osseous findings. IMPRESSION: 1. No abnormality to explain the patient's left posterior chest wall pain. 2. Persistent moderate-sized loculated left pleural effusion with mild adjacent opacities likely atelectasis. 3. Stable borderline enlarged lymph nodes in the mediastinum and bilateral hilar regions, likely reactive. 4. Stable subcentimeter pulmonary nodules in the right lung. Recommend follow-up noncontrast chest CT in 1 year to confirm stability. 5. Atrophic left kidney with large calculus at the renal pelvis. No hydronephrosis. 6. Emphysema and aortic atherosclerosis. Aortic Atherosclerosis (ICD10-I70.0) and Emphysema (ICD10-J43.9). Electronically Signed   By: Audie Pinto M.D.   On: 05/11/2020 10:52    No results found.  CT  Chest W Contrast  Result Date: 05/11/2020 CLINICAL DATA:  Pt states he has pain under left shoulder blade, radiates to right shoulder blade wrapping around to chest, this has been happening about 3 times per month x61mos. EXAM: CT CHEST WITH CONTRAST TECHNIQUE: Multidetector CT imaging of the chest was performed during intravenous contrast administration. CONTRAST:  52mL OMNIPAQUE IOHEXOL 300 MG/ML  SOLN COMPARISON:  CT chest 05/28/2019 FINDINGS: Cardiovascular: Aortic atherosclerosis without aneurysm. Normal heart size. No pericardial effusion. While this study was not optimized for the evaluation of pulmonary embolism in there is no evidence of large central PE. Mediastinum/Nodes: Stable borderline enlarged lymph nodes in the mediastinum and bilateral hilar regions. For example a right hilar lymph node measures 1.0 cm in short axis. No axillary adenopathy. Thyroid gland, trachea, and esophagus demonstrate no significant findings. Lungs/Pleura: Central airways are patent. Moderate upper lobe predominant paraseptal and centrilobular emphysema. Persistent moderate-sized loculated left pleural effusion. With mild adjacent opacities likely atelectasis. Chronic fibrotic changes/scarring bilaterally. No new focal consolidation. Stable 5 mm nodule in the right lung apex (series 2, image 27). Stable 7 mm nodule in the right middle lobe (series 2, image 86). Upper Abdomen: Atrophic left kidney with large calculus at the renal pelvis. No hydronephrosis. Musculoskeletal: No chest wall abnormality. No acute or significant osseous findings. IMPRESSION: 1. No abnormality to explain the patient's left posterior chest wall pain. 2. Persistent moderate-sized loculated left pleural effusion with mild adjacent opacities likely atelectasis. 3. Stable borderline enlarged lymph nodes in the mediastinum and bilateral hilar regions, likely reactive. 4. Stable subcentimeter pulmonary nodules in the right lung. Recommend follow-up  noncontrast chest CT in 1 year to confirm stability. 5. Atrophic left kidney with large calculus at the renal pelvis. No hydronephrosis. 6. Emphysema and aortic atherosclerosis. Aortic Atherosclerosis (ICD10-I70.0) and Emphysema (ICD10-J43.9). Electronically Signed   By: Audie Pinto M.D.   On: 05/11/2020 10:52      Assessment and Plan: Patient Active Problem List   Diagnosis Date Noted  . Encounter for general adult medical examination with abnormal findings 04/22/2018  . Tick bite of abdominal wall 04/22/2018  . Candida rash of groin 04/22/2018    1. Pleural effusion this is a recurring phenomenon.  After he had a thoracentesis done in September he actually had improvement in his symptoms.  He now states that his symptoms are returning and so I think he will therefore benefit from therapeutic thoracentesis.  In addition we will also send the fluid for once again cytology along with chemistry analysis. 2. RA he has rheumatoid factor positive I think this is contributing to his pleural effusion that we are  seeing recurring.  He continues to follow with his primary regarding his rheumatic treatment 3. Pulmonary nodules these have been stable he should be able to get a CT in about a year for follow-up for the nodules. 4. SOB likely being exacerbated with recurrence of the fluid.  We will get him scheduled for a therapeutic diagnostic thoracentesis.  General Counseling: I have discussed the findings of the evaluation and examination with Jose Cuevas.  I have also discussed any further diagnostic evaluation thatmay be needed or ordered today. Jose Cuevas of the findings of todays visit. We also reviewed his medications today and discussed drug interactions and side effects including but not limited excessive drowsiness and altered mental states. We also discussed that there is always a risk not just to him but also people around him. he has been encouraged to call the office with any  questions or concerns that should arise related to todays visit.  Orders Placed This Encounter  Procedures  . IR THORACENTESIS ASP PLEURAL SPACE W/IMG GUIDE    Standing Status:   Future    Standing Expiration Date:   05/19/2021    Order Specific Question:   Are labs required for specimen collection?    Answer:   Yes    Order Specific Question:   Lab orders requested (DO NOT place separate lab orders, these will be automatically ordered during procedure specimen collection):    Answer:   Acid Fast Culture With Reflexed Sensitivities    Order Specific Question:   Lab orders requested (DO NOT place separate lab orders, these will be automatically ordered during procedure specimen collection):    Answer:   Amylase, Pleural or Peritoneal Fluid    Order Specific Question:   Lab orders requested (DO NOT place separate lab orders, these will be automatically ordered during procedure specimen collection):    Answer:   Body Fluid Cell Count With Differential    Order Specific Question:   Lab orders requested (DO NOT place separate lab orders, these will be automatically ordered during procedure specimen collection):    Answer:   Cholesterol, Body Fluid    Order Specific Question:   Lab orders requested (DO NOT place separate lab orders, these will be automatically ordered during procedure specimen collection):    Answer:   Cytology - Non Pap    Order Specific Question:   Lab orders requested (DO NOT place separate lab orders, these will be automatically ordered during procedure specimen collection):    Answer:   Fungus Culture With Stain    Order Specific Question:   Lab orders requested (DO NOT place separate lab orders, these will be automatically ordered during procedure specimen collection):    Answer:   Glucose, Body Fluid    Order Specific Question:   Lab orders requested (DO NOT place separate lab orders, these will be automatically ordered during procedure specimen collection):    Answer:   Gram  Stain    Order Specific Question:   Lab orders requested (DO NOT place separate lab orders, these will be automatically ordered during procedure specimen collection):    Answer:   Lactate Dehydrogenase, Body Fluid    Order Specific Question:   Lab orders requested (DO NOT place separate lab orders, these will be automatically ordered during procedure specimen collection):    Answer:   Triglycerides, Body Fluids    Order Specific Question:   Lab orders requested (DO NOT place separate lab orders, these will be automatically ordered during procedure specimen  collection):    Answer:   Ph, Body Fluid    Order Specific Question:   Lab orders requested (DO NOT place separate lab orders, these will be automatically ordered during procedure specimen collection):    Answer:   Protein, Body Fluid    Order Specific Question:   Reason for Exam (SYMPTOM  OR DIAGNOSIS REQUIRED)    Answer:   pleural effusion    Order Specific Question:   Preferred Imaging Location?    Answer:   [facility]     Time spent: 46  I have personally obtained a history, examined the patient, evaluated laboratory and imaging results, formulated the assessment and plan and placed orders.    Allyne Gee, MD [facility] Pulmonary and Critical Care Sleep medicine

## 2020-05-19 NOTE — Telephone Encounter (Signed)
As per adam send  Losartan with hctz as per pt he is taking as needed as per adam we can send and he supposed tske daily and also lmom to call back and talk to me

## 2020-05-19 NOTE — Patient Instructions (Signed)
Pleural Effusion Pleural effusion is an abnormal buildup of fluid in the layers of tissue between the lungs and the inside of the chest (pleural space) The two layers of tissue that line the lungs and the inside of the chest are called pleura. Usually, there is no air in the space between the pleura, only a thin layer of fluid. Some conditions can cause a large amount of fluid to build up, which can cause the lung to collapse if untreated. A pleural effusion is usually caused by another disease that requires treatment. What are the causes? Pleural effusion can be caused by:  Heart failure.  Certain infections, such as pneumonia or tuberculosis.  Cancer.  A blood clot in the lung (pulmonary embolism).  Complications from surgery, such as from open heart surgery.  Liver disease (cirrhosis).  Kidney disease. What are the signs or symptoms? In some cases, pleural effusion may cause no symptoms. If symptoms are present, they may include:  Shortness of breath, especially when lying down.  Chest pain. This may get worse when taking a deep breath.  Fever.  Dry, long-lasting (chronic) cough.  Hiccups.  Rapid breathing. An underlying condition that is causing the pleural effusion (such as heart failure, pneumonia, blood clots, tuberculosis, or cancer) may also cause other symptoms. How is this diagnosed? This condition may be diagnosed based on:  Your symptoms and medical history.  A physical exam.  A chest X-ray.  A procedure to use a needle to remove fluid from the pleural space (thoracentesis). This fluid is tested.  Other imaging studies of the chest, such as ultrasound or CT scan. How is this treated? Depending on the cause of your condition, treatment may include:  Treating the underlying condition that is causing the effusion. When that condition improves, the effusion will also improve. Examples of treatment for underlying conditions include: ? Antibiotic medicines to  treat an infection. ? Diuretics or other heart medicines to treat heart failure.  Thoracentesis.  Placing a thin flexible tube under your skin and into your chest to continuously drain the effusion (indwelling pleural catheter).  Surgery to remove the outer layer of tissue from the pleural space (decortication).  A procedure to put medicine into the chest cavity to seal the pleural space and prevent fluid buildup (pleurodesis).  Chemotherapy and radiation therapy, if you have cancerous (malignant) pleural effusion. These treatments are typically used to treat cancer. They kill certain cells in the body. Follow these instructions at home:  Take over-the-counter and prescription medicines only as told by your health care provider.  Ask your health care provider what activities are safe for you.  Keep track of how long you are able to do mild exercise (such as walking) before you get short of breath. Write down this information to share with your health care provider. Your ability to exercise should improve over time.  Do not use any products that contain nicotine or tobacco, such as cigarettes and e-cigarettes. If you need help quitting, ask your health care provider.  Keep all follow-up visits as told by your health care provider. This is important. Contact a health care provider if:  The amount of time that you are able to do mild exercise: ? Decreases. ? Does not improve with time.  You have a fever. Get help right away if:  You are short of breath.  You develop chest pain.  You develop a new cough. Summary  Pleural effusion is an abnormal buildup of fluid in the layers   of tissue between the lungs and the inside of the chest.  Pleural effusion can have many causes, including heart failure, pulmonary embolism, infections, or cancer.  Symptoms of pleural effusion can include shortness of breath, chest pain, fever, long-lasting (chronic) cough, hiccups, or rapid  breathing.  Diagnosis often involves making images of the chest (such as with ultrasound or X-ray) and removing fluid (thoracentesis) to send for testing.  Treatment for pleural effusion depends on what underlying condition is causing it. This information is not intended to replace advice given to you by your health care provider. Make sure you discuss any questions you have with your health care provider. Document Revised: 08/30/2017 Document Reviewed: 05/23/2017 Elsevier Patient Education  2020 [employer]  

## 2020-05-20 ENCOUNTER — Other Ambulatory Visit
Admission: RE | Admit: 2020-05-20 | Discharge: 2020-05-20 | Disposition: A | Discharge status: Home / Self Care | Payer: Medicare Other | Source: Ambulatory Visit | Attending: Internal Medicine | Admitting: Internal Medicine

## 2020-05-20 DIAGNOSIS — Z01812 Encounter for preprocedural laboratory examination: Secondary | ICD-10-CM | POA: Diagnosis present

## 2020-05-20 DIAGNOSIS — Z20822 Contact with and (suspected) exposure to covid-19: Secondary | ICD-10-CM | POA: Insufficient documentation

## 2020-05-20 NOTE — Telephone Encounter (Signed)
Left message regarding the need for pt to have covid test and appts follow up. Jose Cuevas

## 2020-05-21 LAB — SARS CORONAVIRUS 2 (TAT 6-24 HRS): SARS Coronavirus 2: NEGATIVE

## 2020-05-24 ENCOUNTER — Ambulatory Visit
Admission: RE | Admit: 2020-05-24 | Discharge: 2020-05-24 | Disposition: A | Discharge status: Home / Self Care | Payer: Medicare Other | Source: Ambulatory Visit | Attending: Physician Assistant | Admitting: Physician Assistant

## 2020-05-24 ENCOUNTER — Ambulatory Visit
Admission: RE | Admit: 2020-05-24 | Discharge: 2020-05-24 | Disposition: A | Discharge status: Home / Self Care | Payer: Medicare Other | Source: Ambulatory Visit | Attending: Internal Medicine | Admitting: Internal Medicine

## 2020-05-24 ENCOUNTER — Other Ambulatory Visit: Payer: Self-pay

## 2020-05-24 DIAGNOSIS — J90 Pleural effusion, not elsewhere classified: Secondary | ICD-10-CM | POA: Insufficient documentation

## 2020-05-24 DIAGNOSIS — I10 Essential (primary) hypertension: Secondary | ICD-10-CM | POA: Insufficient documentation

## 2020-05-24 DIAGNOSIS — E785 Hyperlipidemia, unspecified: Secondary | ICD-10-CM | POA: Insufficient documentation

## 2020-05-24 LAB — PATHOLOGIST SMEAR REVIEW

## 2020-05-24 LAB — GLUCOSE, PLEURAL OR PERITONEAL FLUID: Glucose, Fluid: <20 mg/dL

## 2020-05-24 LAB — PROTEIN, PLEURAL OR PERITONEAL FLUID: Total protein, fluid: 3.9 g/dL

## 2020-05-24 LAB — AMYLASE, PLEURAL OR PERITONEAL FLUID: Amylase, Fluid: 15 U/L

## 2020-05-24 LAB — BODY FLUID CELL COUNT WITH DIFFERENTIAL
Eos, Fluid: 0 %
Lymphs, Fluid: 10 %
Monocyte-Macrophage-Serous Fluid: 74 %
Neutrophil Count, Fluid: 16 %
Total Nucleated Cell Count, Fluid: 368 cu mm

## 2020-05-24 LAB — LACTATE DEHYDROGENASE, PLEURAL OR PERITONEAL FLUID: LD, Fluid: 572 U/L — ABNORMAL HIGH (ref 3–23)

## 2020-05-24 NOTE — Procedures (Signed)
PROCEDURE SUMMARY:  Successful US guided left thoracentesis. Yielded 600 mL of hazy yellow fluid. Patient tolerated procedure well. No immediate complications. EBL = trace   Post procedure chest X-ray reveals no pneumothorax  Lizandra Zakrzewski S Dallie Patton PA-C 05/24/2020 8:53 AM

## 2020-05-25 LAB — ACID FAST SMEAR (AFB, MYCOBACTERIA): Acid Fast Smear: NEGATIVE

## 2020-05-25 LAB — TRIGLYCERIDES, BODY FLUIDS: Triglycerides, Fluid: <9 mg/dL

## 2020-05-25 LAB — CYTOLOGY - NON PAP

## 2020-05-25 LAB — PH, BODY FLUID: pH, Body Fluid: 7.4

## 2020-05-26 ENCOUNTER — Ambulatory Visit (INDEPENDENT_AMBULATORY_CARE_PROVIDER_SITE_OTHER): Payer: Medicare Other | Admitting: Urology

## 2020-05-26 ENCOUNTER — Encounter: Payer: Self-pay | Admitting: Urology

## 2020-05-26 ENCOUNTER — Other Ambulatory Visit: Payer: Self-pay

## 2020-05-26 VITALS — BP 127/74 | HR 62 | Ht 68.0 in | Wt 125.0 lb

## 2020-05-26 DIAGNOSIS — N200 Calculus of kidney: Secondary | ICD-10-CM | POA: Diagnosis not present

## 2020-05-26 DIAGNOSIS — R310 Gross hematuria: Secondary | ICD-10-CM

## 2020-05-26 DIAGNOSIS — N261 Atrophy of kidney (terminal): Secondary | ICD-10-CM | POA: Diagnosis not present

## 2020-05-26 NOTE — Progress Notes (Signed)
05/26/20 5:10 PM   Johnella Moloney 06-15-1961 [id number]  CC: Gross hematuria, nephrolithiasis, abdominal pain  HPI: I saw Mr. Reidel in urology clinic for the above issues.  He is a poor historian and the majority of the history is obtained from his partner today.  His past medical history is notable for rheumatoid arthritis, chronic constipation, and chronic pain on methadone 4 times daily.  He has had a nonobstructing left 1 cm renal stone since at least 2015 with an atrophic left kidney, and was reportedly followed by urologist previously, but none of these notes are available to me, and he is unsure who he was seen previously.  They apparently recommended observation for his nonobstructing left renal stone and atrophic left kidney.  Renal function has been normal.  His most recent cross-sectional imaging of the abdomen is a CT abdomen pelvis with contrast on 05/14/2019 that showed a stable left atrophic kidney with 1.3 cm renal pelvis stone and no significant hydronephrosis.  He also reports intermittent gross hematuria for at least 6 months.  He also reports urinary symptoms of dysuria, weak stream, difficulty urinating, and chronic abdominal pain, worse on the left side.  He also has severe constipation with only 1 bowel movement per week.  He has an extensive 50+ pack year smoking history.  Urinalysis today is pending.   PMH: Past Medical History:  Diagnosis Date  . Back pain   . Collagen vascular disease (HCC)    Rhematoid Arthritis hx.  . Constipation, chronic   . DDD (degenerative disc disease), cervical   . History of duodenal ulcer   . History of kidney stones   . Hyperlipidemia   . Hypertension   . Low kidney function    LT Kidney non-functioning  . Nondiabetic gastroparesis   . Peripheral blood vessel disorder ([place])   . Rheumatoid arthritis with rheumatoid factor (HCC)   . Sleep apnea   . Weight loss     Surgical History: Past Surgical History:  Procedure Laterality  Date  . BACK SURGERY    . COLONOSCOPY WITH PROPOFOL N/A 11/11/2015   Procedure: COLONOSCOPY WITH PROPOFOL;  Surgeon: [name], MD;  Location[facility] ENDOSCOPY;  Service: Endoscopy;  Laterality: N/A;  . COLONOSCOPY WITH PROPOFOL N/A 12/02/2015   Procedure: COLONOSCOPY WITH PROPOFOL;  Surgeon: [name], MD;  Location: [facility] ENDOSCOPY;  Service: Endoscopy;  Laterality: N/A;  . COLONOSCOPY WITH PROPOFOL N/A 09/16/2019   Procedure: COLONOSCOPY WITH PROPOFOL;  Surgeon: Toledo, Benay Pike, MD;  Location: [facility] ENDOSCOPY;  Service: Gastroenterology;  Laterality: N/A;  . ESOPHAGOGASTRODUODENOSCOPY (EGD) WITH PROPOFOL N/A 11/11/2015   Procedure: ESOPHAGOGASTRODUODENOSCOPY (EGD) WITH PROPOFOL;  Surgeon: [name], MD;  Location: [facility] ENDOSCOPY;  Service: Endoscopy;  Laterality: N/A;  . ESOPHAGOGASTRODUODENOSCOPY (EGD) WITH PROPOFOL N/A 12/02/2015   Procedure: ESOPHAGOGASTRODUODENOSCOPY (EGD) WITH PROPOFOL;  Surgeon: [name], MD;  Location: [facility] ENDOSCOPY;  Service: Endoscopy;  Laterality: N/A;  . ESOPHAGOGASTRODUODENOSCOPY (EGD) WITH PROPOFOL N/A 01/20/2016   Procedure: ESOPHAGOGASTRODUODENOSCOPY (EGD) WITH PROPOFOL;  Surgeon: [name], MD;  Location: [facility] ENDOSCOPY;  Service: Endoscopy;  Laterality: N/A;  . ESOPHAGOGASTRODUODENOSCOPY (EGD) WITH PROPOFOL N/A 09/16/2019   Procedure: ESOPHAGOGASTRODUODENOSCOPY (EGD) WITH PROPOFOL;  Surgeon: Toledo, Benay Pike, MD;  Location: [facility] ENDOSCOPY;  Service: Gastroenterology;  Laterality: N/A;  . KNEE RECONSTRUCTION Left      Family History: Family History  Problem Relation Age of Onset  . Diabetes Mother   . Diabetes Father   . Heart attack Maternal  Grandmother     Social History:  reports that he has been smoking cigars. He has smoked for the past 2.00 years. He has never used smokeless tobacco. He reports that he does not drink alcohol and does not use drugs.  Physical Exam: BP 127/74 (BP Location: Left Arm,  Patient Position: Sitting, Cuff Size: Normal)   Pulse 62   Ht 5\' 8"  (1.727 m)   Wt 125 lb (56.7 kg)   BMI 19.01 kg/m    Constitutional:  Alert and oriented, No acute distress. Cardiovascular: No clubbing, cyanosis, or edema. Respiratory: Normal respiratory effort, no increased work of breathing. GI: Abdomen is soft, nontender, nondistended, no abdominal masses  Laboratory Data: Reviewed, see HPI Urinalysis pending  Pertinent Imaging: I have personally reviewed the prior CT scan showing a chronic left atrophic kidney with renal pelvis stone and no significant hydronephrosis since at least 2015.  Assessment & Plan:   In summary, is a 59 year old comorbid male with chronic abdominal pain, atrophic left kidney with left renal pelvis stone since at least 2015, normal renal function, and recurrent gross hematuria over the last year that has never been evaluated.  We discussed that updated imaging would be needed to evaluate if he had movement of the left-sided stone that could be causing his left-sided abdominal pain.  We also discussed treatment options for the stone including observation versus consideration of intervention with ureteroscopy, laser lithotripsy, and stent placement.  We discussed common possible etiologies of hematuria including BPH, malignancy, urolithiasis, medical renal disease, and idiopathic. Standard workup recommended by the [facility] includes imaging with CT urogram to assess the upper tracts, and cystoscopy. Cytology is performed on patient's with gross hematuria to look for malignant cells in the urine.  -Completion of hematuria work-up with CT urogram and cystoscopy -Pending CT findings, consider intervention for left renal pelvis stone that could potentially be causing intermittent obstruction in his chronic left-sided pain  Nickolas Madrid, MD 05/26/2020  [facility] [address] [place], [place] [address] [phone]

## 2020-05-27 LAB — URINALYSIS, COMPLETE
Bilirubin, UA: NEGATIVE
Glucose, UA: NEGATIVE
Ketones, UA: NEGATIVE
Nitrite, UA: NEGATIVE
Specific Gravity, UA: 1.01 (ref 1.005–1.030)
Urobilinogen, Ur: 0.2 mg/dL (ref 0.2–1.0)
pH, UA: 5.5 (ref 5.0–7.5)

## 2020-05-27 LAB — MICROSCOPIC EXAMINATION
Bacteria, UA: NONE SEEN
RBC, Urine: >30 /hpf — AB (ref 0–2)

## 2020-05-27 LAB — CHOLESTEROL, BODY FLUID: Cholesterol, Fluid: 99 mg/dL

## 2020-05-28 DIAGNOSIS — L237 Allergic contact dermatitis due to plants, except food: Secondary | ICD-10-CM | POA: Insufficient documentation

## 2020-05-28 DIAGNOSIS — N261 Atrophy of kidney (terminal): Secondary | ICD-10-CM | POA: Insufficient documentation

## 2020-05-28 DIAGNOSIS — I700 Atherosclerosis of aorta: Secondary | ICD-10-CM | POA: Insufficient documentation

## 2020-05-28 DIAGNOSIS — N200 Calculus of kidney: Secondary | ICD-10-CM | POA: Insufficient documentation

## 2020-05-28 DIAGNOSIS — R918 Other nonspecific abnormal finding of lung field: Secondary | ICD-10-CM | POA: Insufficient documentation

## 2020-05-30 LAB — CULTURE, URINE COMPREHENSIVE

## 2020-06-08 ENCOUNTER — Ambulatory Visit (INDEPENDENT_AMBULATORY_CARE_PROVIDER_SITE_OTHER): Payer: Medicare Other | Admitting: Urology

## 2020-06-08 ENCOUNTER — Encounter: Payer: Self-pay | Admitting: Urology

## 2020-06-08 ENCOUNTER — Ambulatory Visit
Admission: RE | Admit: 2020-06-08 | Discharge: 2020-06-08 | Disposition: A | Discharge status: Home / Self Care | Payer: Medicare Other | Source: Ambulatory Visit | Attending: Urology | Admitting: Urology

## 2020-06-08 ENCOUNTER — Other Ambulatory Visit: Payer: Self-pay

## 2020-06-08 ENCOUNTER — Other Ambulatory Visit: Payer: Self-pay | Admitting: Urology

## 2020-06-08 VITALS — BP 138/77 | HR 63 | Ht 68.0 in | Wt 127.5 lb

## 2020-06-08 DIAGNOSIS — K529 Noninfective gastroenteritis and colitis, unspecified: Secondary | ICD-10-CM | POA: Diagnosis not present

## 2020-06-08 DIAGNOSIS — N401 Enlarged prostate with lower urinary tract symptoms: Secondary | ICD-10-CM | POA: Diagnosis not present

## 2020-06-08 DIAGNOSIS — R310 Gross hematuria: Secondary | ICD-10-CM | POA: Diagnosis present

## 2020-06-08 DIAGNOSIS — N138 Other obstructive and reflux uropathy: Secondary | ICD-10-CM

## 2020-06-08 LAB — POCT I-STAT CREATININE: Creatinine, Ser: 1.2 mg/dL (ref 0.61–1.24)

## 2020-06-08 MED ORDER — IOHEXOL 300 MG/ML  SOLN
100.0000 mL | Freq: Once | INTRAMUSCULAR | Status: AC | PRN
  Administered 2020-06-08: 100 mL via INTRAVENOUS

## 2020-06-08 NOTE — Progress Notes (Signed)
Cystoscopy Procedure Note:  Indication: Gross hematuria  After informed consent and discussion of the procedure and its risks, Jose Cuevas was positioned and prepped in the standard fashion. Cystoscopy was performed with a flexible cystoscope. The urethra, bladder neck and entire bladder was visualized in a standard fashion. The prostate was moderate in size with a high bladder neck and obstructing lateral lobes. The ureteral orifices were visualized in their normal location and orientation.  No suspicious bladder lesions, no abnormalities on retroflexion.  Imaging: I personally reviewed the CT urogram from today that shows a stable 1 cm left lower pole stone with no hydronephrosis that is unchanged over the last 5 years, atrophic left upper pole, no other acute abnormalities.  Prostate measures 28 g.  Findings: Stable 1 cm left renal stone with no evidence of obstruction, normal cystoscopy  -------------------------------------------------  Assessment and Plan: 59 year old male with chronic pain and persistent left lower abdominal pain, especially after eating and with severe constipation, currently being worked up for gross hematuria.  Urine culture was negative, CT urogram was essentially benign and unchanged with a non-obstructing 1 cm left renal stone that has not changed over the last 5 years, and cystoscopy showing no suspicious bladder lesions but possibly obstructing prostate.  His primary complaint is the left lower quadrant pain with eating, and weak urinary stream with some post void dribbling.  He was recently started on Flomax and he does feel this is improved his urination moderately.    We discussed options at length for his left renal stone with no evidence of obstruction or hydronephrosis including observation or intervention with ureteroscopy, laser, and stent.  His left-sided abdominal pain is primarily associated with eating, and I am not convinced that this is true renal  colic associated with his nonobstructive left renal stone that is unchanged over the last 5 years.  He would like to hold off on any surgery for his left-sided stone at this point, which is very reasonable.  Regarding his urinary symptoms, I recommended continuing Flomax for the time being, with close follow-up in 6 to 8 weeks for repeat IPSS/PVR, and consider UroLift at that time if persistently bothersome urinary symptoms.  I spent 25 total minutes on the day of the encounter including pre-visit review of the medical record, face-to-face time with the patient discussing BPH, nephrolithiasis and treatment options, and post visit ordering of labs/imaging/tests.   Nickolas Madrid, MD 06/08/2020

## 2020-06-10 LAB — CYTOLOGY - NON PAP

## 2020-06-10 NOTE — Addendum Note (Signed)
Addended by: Donalee Citrin on: 06/10/2020 10:44 AM   Modules accepted: Orders

## 2020-06-13 ENCOUNTER — Telehealth: Payer: Self-pay

## 2020-06-13 NOTE — Telephone Encounter (Signed)
-----   Message from Billey Co, MD sent at 06/13/2020  8:25 AM EDT ----- No cancer cells in urine, keep follow up as scheduled  Nickolas Madrid, MD 06/13/2020

## 2020-06-14 NOTE — Telephone Encounter (Signed)
Called pt, no answer. Unable to leave message as no DPR is on file. 1st attempt.

## 2020-06-15 NOTE — Telephone Encounter (Signed)
Called pt, no answer. Unable to leave message. 2nd attempt.  

## 2020-06-16 NOTE — Telephone Encounter (Signed)
Called pt no answer. 3rd attempt. Letter mailed.  

## 2020-06-17 ENCOUNTER — Telehealth: Payer: Self-pay | Admitting: Urology

## 2020-06-17 ENCOUNTER — Telehealth: Payer: Self-pay

## 2020-06-17 NOTE — Telephone Encounter (Signed)
Confirmed and screened for 06-21-20 ov. 

## 2020-06-17 NOTE — Telephone Encounter (Signed)
Pt.'s wife called today pt. Was with her at the time of call. I gave them the message from Dr. Diamantina Providence on 06/13/20  No cancer cells in urine, keep follow up as scheduled.

## 2020-06-17 NOTE — Telephone Encounter (Signed)
LMOM for OV on 9/21 

## 2020-06-20 NOTE — Telephone Encounter (Signed)
error 

## 2020-06-21 ENCOUNTER — Ambulatory Visit (INDEPENDENT_AMBULATORY_CARE_PROVIDER_SITE_OTHER): Payer: Medicare Other | Admitting: Internal Medicine

## 2020-06-21 ENCOUNTER — Other Ambulatory Visit: Payer: Self-pay

## 2020-06-21 ENCOUNTER — Encounter: Payer: Self-pay | Admitting: Internal Medicine

## 2020-06-21 DIAGNOSIS — R918 Other nonspecific abnormal finding of lung field: Secondary | ICD-10-CM | POA: Diagnosis not present

## 2020-06-21 DIAGNOSIS — I10 Essential (primary) hypertension: Secondary | ICD-10-CM

## 2020-06-21 DIAGNOSIS — J90 Pleural effusion, not elsewhere classified: Secondary | ICD-10-CM | POA: Diagnosis not present

## 2020-06-21 DIAGNOSIS — Z23 Encounter for immunization: Secondary | ICD-10-CM | POA: Diagnosis not present

## 2020-06-21 NOTE — Progress Notes (Signed)
[facility] [place], [place] [address]  Pulmonary Sleep Medicine   Office Visit Note  Patient Name: Jose Cuevas DOB: Apr 05, 1961 MRN [id number]  Date of Service: 06/22/2020  Complaints/HPI: Patient is here for routine pulmonary follow-up S/p US guided thoracentesis, 8/24, 600 mL removed at that time, cytology report-benign mesothelial cells, lymphocytes and neutrophils He says his breathing is much better since his thoracentesis At this time he would like to continue with current plan, he would not like to be referred to CT surgeon at this time He is scheduled to see GI in the next upcoming weeks to follow-up on unintentional weight loss as well as poor appetite  ROS  General: (-) fever, (-) chills, (-) night sweats, (-) weakness Skin: (-) rashes, (-) itching,. Eyes: (-) visual changes, (-) redness, (-) itching. Nose and Sinuses: (-) nasal stuffiness or itchiness, (-) postnasal drip, (-) nosebleeds, (-) sinus trouble. Mouth and Throat: (-) sore throat, (-) hoarseness. Neck: (-) swollen glands, (-) enlarged thyroid, (-) neck pain. Respiratory: - cough, (-) bloody sputum, - shortness of breath, - wheezing. Cardiovascular: - ankle swelling, (-) chest pain. Lymphatic: (-) lymph node enlargement. Neurologic: (-) numbness, (-) tingling. Psychiatric: (-) anxiety, (-) depression   Current Medication: Outpatient Encounter Medications as of 06/21/2020  Medication Sig Note  . Aspirin-Caffeine (BC FAST PAIN RELIEF ARTHRITIS PO) Take by mouth.   . folic acid (FOLVITE) 1 MG tablet Take 1 mg by mouth daily.   Marland Kitchen HUMIRA PEN 40 MG/0.4ML PNKT SMARTSIG:40 Milligram(s) SUB-Q Every 2 Weeks   . losartan-hydrochlorothiazide (HYZAAR) 100-25 MG tablet Take 1 tablet by mouth daily.   . methadone (DOLOPHINE) 10 MG tablet Take 10 mg by mouth 4 (four) times daily.    . methotrexate 50 MG/2ML injection Inject 50 mg/m2 into the vein once. 0.67mls once every 2 weeks 06/19/2019:  Wednesday   . pantoprazole (PROTONIX) 40 MG tablet Take 40 mg by mouth 2 (two) times daily.   . tamsulosin (FLOMAX) 0.4 MG CAPS capsule Take 1 capsule (0.4 mg total) by mouth daily.   . vitamin B-12 (CYANOCOBALAMIN) 1000 MCG tablet Take 1,000 mcg by mouth daily.   Marland Kitchen triamcinolone (KENALOG) 0.025 % cream Apply 1 application topically 2 (two) times daily. (Patient not taking: Reported on 06/21/2020)    No facility-administered encounter medications on file as of 06/21/2020.    Surgical History: Past Surgical History:  Procedure Laterality Date  . BACK SURGERY    . COLONOSCOPY WITH PROPOFOL N/A 11/11/2015   Procedure: COLONOSCOPY WITH PROPOFOL;  Surgeon: [name], MD;  Location: [facility] ENDOSCOPY;  Service: Endoscopy;  Laterality: N/A;  . COLONOSCOPY WITH PROPOFOL N/A 12/02/2015   Procedure: COLONOSCOPY WITH PROPOFOL;  Surgeon: [name], MD;  Location: [facility] ENDOSCOPY;  Service: Endoscopy;  Laterality: N/A;  . COLONOSCOPY WITH PROPOFOL N/A 09/16/2019   Procedure: COLONOSCOPY WITH PROPOFOL;  Surgeon: Toledo, Benay Pike, MD;  Location: [facility] ENDOSCOPY;  Service: Gastroenterology;  Laterality: N/A;  . ESOPHAGOGASTRODUODENOSCOPY (EGD) WITH PROPOFOL N/A 11/11/2015   Procedure: ESOPHAGOGASTRODUODENOSCOPY (EGD) WITH PROPOFOL;  Surgeon: [name], MD;  Location: [facility] ENDOSCOPY;  Service: Endoscopy;  Laterality: N/A;  . ESOPHAGOGASTRODUODENOSCOPY (EGD) WITH PROPOFOL N/A 12/02/2015   Procedure: ESOPHAGOGASTRODUODENOSCOPY (EGD) WITH PROPOFOL;  Surgeon: [name], MD;  Location: [facility] ENDOSCOPY;  Service: Endoscopy;  Laterality: N/A;  . ESOPHAGOGASTRODUODENOSCOPY (EGD) WITH PROPOFOL N/A 01/20/2016   Procedure: ESOPHAGOGASTRODUODENOSCOPY (EGD) WITH PROPOFOL;  Surgeon: [name], MD;  Location: [facility] ENDOSCOPY;  Service: Endoscopy;  Laterality: N/A;  . ESOPHAGOGASTRODUODENOSCOPY (EGD) WITH PROPOFOL N/A 09/16/2019   Procedure: ESOPHAGOGASTRODUODENOSCOPY (EGD) WITH PROPOFOL;   Surgeon: Toledo, Benay Pike, MD;  Location: [facility] ENDOSCOPY;  Service: Gastroenterology;  Laterality: N/A;  . KNEE RECONSTRUCTION Left     Medical History: Past Medical History:  Diagnosis Date  . Back pain   . Collagen vascular disease (HCC)    Rhematoid Arthritis hx.  . Constipation, chronic   . DDD (degenerative disc disease), cervical   . History of duodenal ulcer   . History of kidney stones   . Hyperlipidemia   . Hypertension   . Low kidney function    LT Kidney non-functioning  . Nondiabetic gastroparesis   . Peripheral blood vessel disorder ([place])   . Rheumatoid arthritis with rheumatoid factor (HCC)   . Sleep apnea   . Weight loss     Family History: Family History  Problem Relation Age of Onset  . Diabetes Mother   . Diabetes Father   . Heart attack Maternal Grandmother     Social History: Social History   Socioeconomic History  . Marital status: Married    Spouse name: Not on file  . Number of children: Not on file  . Years of education: Not on file  . Highest education level: Not on file  Occupational History  . Not on file  Tobacco Use  . Smoking status: Current Some Day Smoker    Years: 2.00    Types: Cigars  . Smokeless tobacco: Never Used  . Tobacco comment: Occasional  3 a day  Vaping Use  . Vaping Use: Never used  Substance and Sexual Activity  . Alcohol use: No  . Drug use: No  . Sexual activity: Yes    Birth control/protection: None  Other Topics Concern  . Not on file  Social History Narrative  . Not on file   Social Determinants of Health   Financial Resource Strain:   . Difficulty of Paying Living Expenses: Not on file  Food Insecurity:   . Worried About Charity fundraiser in the Last Year: Not on file  . Ran Out of Food in the Last Year: Not on file  Transportation Needs:   . Lack of Transportation (Medical): Not on file  . Lack of Transportation (Non-Medical): Not on file  Physical Activity:   . Days of Exercise per  Week: Not on file  . Minutes of Exercise per Session: Not on file  Stress:   . Feeling of Stress : Not on file  Social Connections:   . Frequency of Communication with Friends and Family: Not on file  . Frequency of Social Gatherings with Friends and Family: Not on file  . Attends Religious Services: Not on file  . Active Member of Clubs or Organizations: Not on file  . Attends Archivist Meetings: Not on file  . Marital Status: Not on file  Intimate Partner Violence:   . Fear of Current or Ex-Partner: Not on file  . Emotionally Abused: Not on file  . Physically Abused: Not on file  . Sexually Abused: Not on file    Vital Signs: Blood pressure (!) 162/77, pulse 63, temperature 98.3 F (36.8 C), resp. rate 16, height 5\' 8"  (1.727 m), weight 124 lb 9.6 oz (56.5 kg), SpO2 98 %.  Examination: General Appearance: The patient is well-developed, well-nourished, and in no distress. Skin: Gross inspection of skin unremarkable. Head: normocephalic, no gross deformities. Eyes: no gross deformities noted. ENT: ears  appear grossly normal no exudates. Neck: Supple. No thyromegaly. No LAD. Respiratory: Wheezing throughout, no rhonchi noted. Cardiovascular: Normal S1 and S2 without murmur or rub. Extremities: No cyanosis. pulses are equal. Neurologic: Alert and oriented. No involuntary movements.  LABS: Recent Results (from the past 2160 hour(s))  CBC with Differential/Platelet     Status: Abnormal   Collection Time: 04/26/20  9:50 AM  Result Value Ref Range   WBC 7.1 4.0 - 10.5 K/uL   RBC 4.54 4.22 - 5.81 MIL/uL   Hemoglobin 14.3 13.0 - 17.0 g/dL   HCT 41.6 39 - 52 %   MCV 91.6 80.0 - 100.0 fL   MCH 31.5 26.0 - 34.0 pg   MCHC 34.4 30.0 - 36.0 g/dL   RDW 14.0 11.5 - 15.5 %   Platelets 238 150 - 400 K/uL   nRBC 0.0 0.0 - 0.2 %   Neutrophils Relative % 45 %   Neutro Abs 3.2 1.7 - 7.7 K/uL   Lymphocytes Relative 42 %   Lymphs Abs 3.0 0.7 - 4.0 K/uL   Monocytes Relative 6 %    Monocytes Absolute 0.4 0 - 1 K/uL   Eosinophils Relative 5 %   Eosinophils Absolute 0.3 0 - 0 K/uL   Basophils Relative 2 %   Basophils Absolute 0.2 (H) 0 - 0 K/uL   Immature Granulocytes 0 %   Abs Immature Granulocytes 0.02 0.00 - 0.07 K/uL    Comment: Performed at [facility], [address] [place], [place] [address]  Lipid panel     Status: Abnormal   Collection Time: 04/26/20  9:50 AM  Result Value Ref Range   Cholesterol 176 0 - 200 mg/dL   Triglycerides 115 <150 mg/dL   HDL 37 (L) >40 mg/dL   Total CHOL/HDL Ratio 4.8 RATIO   VLDL 23 0 - 40 mg/dL   LDL Cholesterol 116 (H) 0 - 99 mg/dL    Comment:        Total Cholesterol/HDL:CHD Risk Coronary Heart Disease Risk Table                     Men   Women  1/2 Average Risk   3.4   3.3  Average Risk       5.0   4.4  2 X Average Risk   9.6   7.1  3 X Average Risk  23.4   11.0        Use the calculated Patient Ratio above and the CHD Risk Table to determine the patient's CHD Risk.        ATP III CLASSIFICATION (LDL):  <100     mg/dL   Optimal  100-129  mg/dL   Near or Above                    Optimal  130-159  mg/dL   Borderline  160-189  mg/dL   High  >190     mg/dL   Very High Performed at [facility], [place], [address] [place], [place] [address]   TSH     Status: None   Collection Time: 04/26/20  9:50 AM  Result Value Ref Range   TSH 1.119 0.350 - 4.500 uIU/mL    Comment: Performed by a 3rd Generation assay with a functional sensitivity of <=0.01 uIU/mL. Performed at [facility], [address] [place], [place] [address]   Comprehensive metabolic panel     Status: Abnormal   Collection  Time: 04/26/20  9:50 AM  Result Value Ref Range   Sodium 140 135 - 145 mmol/L   Potassium 4.2 3.5 - 5.1 mmol/L   Chloride 110 98 - 111 mmol/L   CO2 23 22 - 32 mmol/L   Glucose, Bld 81 70 - 99 mg/dL    Comment: Glucose reference range applies only to samples taken after fasting for at least 8  hours.   BUN 13 6 - 20 mg/dL   Creatinine, Ser 1.02 0.61 - 1.24 mg/dL   Calcium 8.9 8.9 - 10.3 mg/dL   Total Protein 6.6 6.5 - 8.1 g/dL   Albumin 3.4 (L) 3.5 - 5.0 g/dL   AST 13 (L) 15 - 41 U/L   ALT 8 0 - 44 U/L   Alkaline Phosphatase 70 38 - 126 U/L   Total Bilirubin 0.4 0.3 - 1.2 mg/dL   GFR calc non Af Amer >60 >60 mL/min   GFR calc Af Amer >60 >60 mL/min   Anion gap 7 5 - 15    Comment: Performed at [facility], [address] [place], [place] [address]  PSA     Status: None   Collection Time: 04/26/20  9:50 AM  Result Value Ref Range   Prostatic Specific Antigen 0.45 0.00 - 4.00 ng/mL    Comment: (NOTE) While PSA levels of <=4.0 ng/ml are reported as reference range, some men with levels below 4.0 ng/ml can have prostate cancer and many men with PSA above 4.0 ng/ml do not have prostate cancer.  Other tests such as free PSA, age specific reference ranges, PSA velocity and PSA doubling time may be helpful especially in men less than 69 years old. Performed at [facility], [place] [address], [place], [place] [address]   SARS CORONAVIRUS 2 (TAT 6-24 HRS) Nasopharyngeal Nasopharyngeal Swab     Status: None   Collection Time: 05/20/20  1:54 PM   Specimen: Nasopharyngeal Swab  Result Value Ref Range   SARS Coronavirus 2 NEGATIVE NEGATIVE    Comment: (NOTE) SARS-CoV-2 target nucleic acids are NOT DETECTED.  The SARS-CoV-2 RNA is generally detectable in upper and lower respiratory specimens during the acute phase of infection. Negative results do not preclude SARS-CoV-2 infection, do not rule out co-infections with other pathogens, and should not be used as the sole basis for treatment or other patient management decisions. Negative results must be combined with clinical observations, patient history, and epidemiological information. The expected result is Negative.  Fact Sheet for Patients: SugarRoll.be  Fact Sheet for  Healthcare Providers: https://www.woods-mathews.com/  This test is not yet approved or cleared by the [place] FDA and  has been authorized for detection and/or diagnosis of SARS-CoV-2 by FDA under an Emergency Use Authorization (EUA). This EUA will remain  in effect (meaning this test can be used) for the duration of the COVID-19 declaration under Se ction 564(b)(1) of the Act, 21 U.S.C. section 360bbb-3(b)(1), unless the authorization is terminated or revoked sooner.  Performed at [facility], [place] [address], [place], [place] [address]   Cytology - Non PAP;     Status: None   Collection Time: 05/24/20  8:45 AM  Result Value Ref Range   CYTOLOGY - NON GYN      CYTOLOGY - NON PAP CASE: [id number] PATIENT: Yazan Noblet Non-Gynecological Cytology Report     Specimen Submitted: A. Pleural fluid, left  Clinical History: None provided      DIAGNOSIS: A. PLEURAL FLUID, LEFT; THORACENTESIS: -  BENIGN MESOTHELIAL CELLS, LYMPHOCYTES AND NEUTROPHILS.  Slides reviewed: 1 ThinPrep; 1 cytospin and 1 cellblock.   GROSS DESCRIPTION: A. Labeled: Left pleural fluid Received: Fresh Volume: 575 mL Description of fluid and container in which it is received: Received in a glass 1000 mL evacuated container is yellow cloudy fluid. Cytospin slide(s) received: Yes, 1  Specimen material submitted for: Cell block and ThinPrep  The cell block material is fixed in formalin for 6 hours prior to processing.   Final Diagnosis performed by Raynelle Bring, MD.   Electronically signed 05/25/2020 1:44:57PM The electronic signature indicates that the named Attending Pathologist has evaluated the specimen Technical component performed at [facility], [address], [place], [place] [address] Lab: [phone] Dir: Rush Farmer, MD, MMM  Professional component performed at [facility], [facility], [place], [place], [place] [address] Lab:  [phone] Dir: Dellia Nims. Rubinas, MD   Lactate dehydrogenase (pleural or peritoneal fluid)     Status: Abnormal   Collection Time: 05/24/20  8:45 AM  Result Value Ref Range   LD, Fluid 572 (H) 3 - 23 U/L    Comment: (NOTE) Results should be evaluated in conjunction with serum values    Fluid Type-FLDH CYTO PLEU     Comment: Performed at [facility], [address] [place], [place] [address]  Triglycerides, Body Fluid     Status: None   Collection Time: 05/24/20  8:45 AM  Result Value Ref Range   Triglycerides, Fluid <9 Not Estab. mg/dL    Comment: (NOTE) The reference intervals and other method performance specifications have not been established for this test. The test result should be integrated into the clinical context for interpretation. The reference interval(s) and other method performance specifications have not been established for this body fluid. The test result must be integrated into the clinical context for interpretation. Performed At: [facility] [address] [place], [place] [id number] Rush Farmer MD BS:[id number]    Fluid Type-FTRIG CYTO PLEU     Comment: Performed at [facility], [address] [place], [place] [address]  Body fluid cell count with differential     Status: Abnormal   Collection Time: 05/24/20  8:45 AM  Result Value Ref Range   Fluid Type-FCT CYTO PLEU    Color, Fluid YELLOW (A) YELLOW   Appearance, Fluid CLEAR CLEAR   Total Nucleated Cell Count, Fluid 368 cu mm   Neutrophil Count, Fluid 16 %   Lymphs, Fluid 10 %   Monocyte-Macrophage-Serous Fluid 74 %   Eos, Fluid 0 %    Comment: Performed at [facility], [address] [place], [place] [address]  Cholesterol, body fluid     Status: None   Collection Time: 05/24/20  8:45 AM  Result Value Ref Range   Cholesterol, Fluid 99 mg/dL    Comment: (NOTE) INTERPRETIVE INFORMATION: Cholesterol, Body Fluid For information on body fluid reference  ranges and/or interpretive guidance visit SuperbApps.be This test was developed and its performance characteristics determined by [employer]. It has not been cleared or approved by the [place] Food and Drug Administration. This test was performed in a CLIA certified laboratory and is intended for clinical purposes. Performed At: [facility] [address] [place], [place] [id number] Hilton Sinclair I MD ZG:[id number]    Chol, Fluid Type CYTO PLEU     Comment: Performed at [facility], [address] [place],  [address]  Amylase, pleural or peritoneal fluid  Status: None   Collection Time: 05/24/20  8:45 AM  Result Value Ref Range   Amylase, Fluid 15 U/L    Comment: NO NORMAL RANGE ESTABLISHED FOR THIS TEST   Fluid Type-FAMY CYTO PLEU     Comment: Performed at [facility], [address] [place], [place] [address]  Protein, pleural or peritoneal fluid     Status: None   Collection Time: 05/24/20  8:45 AM  Result Value Ref Range   Total protein, fluid 3.9 g/dL    Comment: (NOTE) No normal range established for this test Results should be evaluated in conjunction with serum values    Fluid Type-FTP CYTO PLEU     Comment: Performed at [facility], [address] [place], [place] [address]  Fungus Culture With Stain     Status: None (Preliminary result)   Collection Time: 05/24/20  8:45 AM   Specimen: PATH Cytology Pleural fluid  Result Value Ref Range   Fungus Stain Final report     Comment: (NOTE) Performed At: [facility] [address] [place], [place] [id number] Rush Farmer MD JG:[id number]    Fungus (Mycology) Culture PENDING    Fungal Source PLEURAL     Comment: Performed at [facility], [address] [place], [place] [address]  Glucose, pleural or peritoneal fluid     Status: None   Collection Time: 05/24/20  8:45 AM  Result Value Ref Range   Glucose, Fluid <20  mg/dL    Comment: (NOTE) No normal range established for this test Results should be evaluated in conjunction with serum values    Fluid Type-FGLU CYTO PLEU     Comment: Performed at [facility], [address] [place], [place] [address]  [facility], Body Fluid     Status: None   Collection Time: 05/24/20  8:45 AM  Result Value Ref Range   pH, Body Fluid 7.4 Not Estab.    Comment: (NOTE) This test was developed and its performance characteristics determined by [facility]. It has not been cleared or approved by the Food and Drug Administration. The reference interval(s) and other method performance specifications have not been established for this body fluid. The test result must be integrated into the clinical context for interpretation. Performed At: [facility] [place], [place] [id number] Rush Farmer MD ZY:[id number]    Source PLEURAL     Comment: Performed at [facility], [address] [place], [place] [address]  Acid Fast Smear (AFB)     Status: None   Collection Time: 05/24/20  8:45 AM   Specimen: PATH Cytology Pleural fluid  Result Value Ref Range   AFB Specimen Processing Concentration    Acid Fast Smear Negative     Comment: (NOTE) Performed At: [facility] [address] [place], [place] [id number] Rush Farmer MD EK:[id number]    Source (AFB) PLEURAL     Comment: Performed at [facility], [address] [place], [place] [address]  Pathologist smear review     Status: None   Collection Time: 05/24/20  8:45 AM  Result Value Ref Range   Path Review Cytospin of pleural fluid is reviewed.     Comment: Patient with rheumatoid arthritis presents with pleural effusion. Reactive mesothelial cells and mixed inflammatory cells. Negative for malignancy. Reviewed by Elmon Kirschner, M.D. Performed at [facility], [address] [place],  [address]   Fungus Culture Result     Status: None  Collection Time: 05/24/20  8:45 AM  Result Value Ref Range   Result 1 Comment     Comment: (NOTE) KOH/Calcofluor preparation:  no fungus observed. Performed At: [facility] [place], [place] [id number] Rush Farmer MD YI:[id number]   Urinalysis, Complete     Status: Abnormal   Collection Time: 05/26/20  3:29 PM  Result Value Ref Range   Specific Gravity, UA 1.010 1.005 - 1.030   pH, UA 5.5 5.0 - 7.5   Color, UA Yellow Yellow   Appearance Ur Cloudy (A) Clear   Leukocytes,UA Trace (A) Negative   Protein,UA Trace (A) Negative/Trace   Glucose, UA Negative Negative   Ketones, UA Negative Negative   RBC, UA 3+ (A) Negative   Bilirubin, UA Negative Negative   Urobilinogen, Ur 0.2 0.2 - 1.0 mg/dL   Nitrite, UA Negative Negative   Microscopic Examination See below:   CULTURE, URINE COMPREHENSIVE     Status: None   Collection Time: 05/26/20  3:29 PM   Specimen: Urine   UR  Result Value Ref Range   Urine Culture, Comprehensive Final report    Organism ID, Bacteria Comment     Comment: No growth in 36 - 48 hours.  Microscopic Examination     Status: Abnormal   Collection Time: 05/26/20  3:29 PM   Urine  Result Value Ref Range   WBC, UA 11-30 (A) 0 - 5 /hpf   RBC >30 (A) 0 - 2 /hpf   Epithelial Cells (non renal) 0-10 0 - 10 /hpf   Renal Epithel, UA 0-10 (A) None seen /hpf   Casts Present (A) None seen /lpf   Cast Type Hyaline casts N/A   Bacteria, UA None seen None seen/Few  Cytology - Non PAP;     Status: None   Collection Time: 06/08/20 12:00 AM  Result Value Ref Range   CYTOLOGY - NON GYN      Cytology - Non PAP CASE: [id number] PATIENT: Bracy Shane Non-Gynecological Cytology Report     Specimen Submitted: A. Urine  Clinical History: 59 year old male undergoes evaluation for gross hematuria. Stable 1 cm left renal stone with no evidence of obstruction, normal cystoscopy      DIAGNOSIS: A. URINE; BLADDER WASHING: - NEGATIVE FOR  HIGH-GRADE UROTHELIAL CARCINOMA. - BENIGN UROTHELIAL CELLS AND RBCS.  The [place] for Reporting Urinary Cytology 2016  GROSS DESCRIPTION: A. Labeled: Received labeled with the patient's name and date of birth (per requisition urine, bladder wash) Received: Fresh Volume: 35 mL Description of fluid and container in which it is received: Received in a clear plastic specimen container with a yellow screw top lid is pale yellow clear fluid. Specimen material submitted for: ThinPrep   Final Diagnosis performed by Quay Burow, MD.   Electronically signed 06/10/2020 11:39:04AM The electronic signature indicates tha t the named Attending Pathologist has evaluated the specimen Technical component performed at [facility], [address], [place], [place] [address] Lab: [phone] Dir: Rush Farmer, MD, MMM  Professional component performed at [facility], [facility], [place], [place], [place] [address] Lab: [phone] Dir: Dellia Nims. Rubinas, MD   I-STAT creatinine     Status: None   Collection Time: 06/08/20  8:21 AM  Result Value Ref Range   Creatinine, Ser 1.20 0.61 - 1.24 mg/dL    Radiology: CT HEMATURIA WORKUP  Result Date: 06/08/2020 CLINICAL DATA:  Left flank pain with nausea, vomiting, and gross hematuria intermittently for 1 year. EXAM: CT  ABDOMEN AND PELVIS WITHOUT AND WITH CONTRAST TECHNIQUE: Multidetector CT imaging of the abdomen and pelvis was performed following the standard protocol before and following the bolus administration of intravenous contrast. CONTRAST:  153mL OMNIPAQUE IOHEXOL 300 MG/ML  SOLN COMPARISON:  05/14/2019 and overlapping portions of CT chest from 05/10/2020 FINDINGS: Lower chest: Small to moderate likely exudative left pleural effusion with enhancement along the pleural margin. This is stable compared to 05/10/2020. Hepatobiliary: Contracted gallbladder with resulting gallbladder wall thickening. Otherwise unremarkable. Pancreas:  Unremarkable Spleen: Unremarkable Adrenals/Urinary Tract: Both adrenal glands appear normal. 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 There is mild wall thickening and mild wall enhancement in the lower pole collecting system probably due to local irritation/inflammation, but currently the lower pole moiety stone is not obstructive and there is no overt hydronephrosis. On delayed images, contrast medium extends into the left ureter. There is non filling of portions of the ureters. There are approximately 5 additional nonobstructive renal calculi associated with the left kidney lower pole moiety measuring up to 0.4 cm in diameter. There are at least 2 tiny nonobstructive calculi in the right kidney measuring up to 0.2 cm in diameter. There is also a small vascular calcification in the right renal hilum. No other filling defects are identified along the urothelium. The adrenal glands appear normal.  Urinary bladder unremarkable. A left kidney upper pole cyst measures 19 Hounsfield units precontrast and 28 Hounsfield units postcontrast, accordingly not definitive for enhancement, appearance favoring benign cyst. A right kidney lower pole cyst measures 16 Hounsfield units precontrast and 20 Hounsfield units postcontrast, likewise compatible with benign cyst. Other tiny hypodense lesions are present in the kidneys and probably cysts but technically too small to characterize. Stomach/Bowel: Mild sigmoid colon diverticulosis. Vascular/Lymphatic: Chronically stable wall thickening in the inferior abdominal aorta starting about at the level of the origin of the IMA and extending into the proximal common iliac arteries. No change back through 01/05/2014. 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 Reproductive: Small left hydrocele versus a large epididymal cyst. Other: No supplemental non-categorized findings. Musculoskeletal: Mild lower lumbar spondylosis and degenerative disc disease. IMPRESSION: 1. 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 2. 1.1 cm V-shaped calculus in the collecting system of the left lower pole moiety extending to the UPJ. There is mild wall thickening and mild wall enhancement in the lower pole collecting system probably due to local irritation/inflammation, but currently the lower pole moiety stone is not obstructive and there is no overt hydronephrosis. 3. Bilateral nonobstructive nephrolithiasis. 4. Bilateral renal cysts. 5. Chronically stable (since at least 2015) wall thickening in the inferior abdominal aorta starting about at the level of the origin of the IMA and extending into the proximal common iliac arteries. 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 6. Small left hydrocele versus a large epididymal cyst. 7. Mild sigmoid colon diverticulosis. 8. Mild lower lumbar spondylosis and degenerative disc disease. 9. Aortic atherosclerosis. Aortic Atherosclerosis (ICD10-I70.0). Electronically Signed   By: Van Clines M.D.   On: 06/08/2020 09:34    No results found.  DG Chest 1 View  Result Date: 05/24/2020 CLINICAL DATA:  Status post left thoracentesis.  EXAM: CHEST  1 VIEW COMPARISON:  July 01, 2019. FINDINGS: The heart size and mediastinal contours are within normal limits. No pneumothorax is noted. Stable small left pleural effusion or pleural thickening is noted in the left lung base laterally. Stable minimal left basilar subsegmental atelectasis or scarring is noted. The visualized skeletal structures are unremarkable. IMPRESSION: No pneumothorax status  post left-sided thoracentesis. Electronically Signed   By: Marijo Conception M.D.   On: 05/24/2020 09:44   CT HEMATURIA WORKUP  Result Date: 06/08/2020 CLINICAL DATA:  Left flank pain with nausea, vomiting, and gross hematuria intermittently for 1 year. EXAM: CT ABDOMEN AND PELVIS WITHOUT AND WITH CONTRAST TECHNIQUE: Multidetector CT imaging of the abdomen and pelvis was performed following the standard protocol before and following the bolus administration of intravenous contrast. CONTRAST:  163mL OMNIPAQUE IOHEXOL 300 MG/ML  SOLN COMPARISON:  05/14/2019 and overlapping portions of CT chest from 05/10/2020 FINDINGS: Lower chest: Small to moderate likely exudative left pleural effusion with enhancement along the pleural margin. This is stable compared to 05/10/2020. Hepatobiliary: Contracted gallbladder with resulting gallbladder wall thickening. Otherwise unremarkable. Pancreas: Unremarkable Spleen: Unremarkable Adrenals/Urinary Tract: Both adrenal glands appear normal. 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 There is mild wall thickening and mild wall enhancement in the lower pole collecting system probably due to local irritation/inflammation, but currently the lower pole moiety stone is not obstructive and there is no overt hydronephrosis. On delayed images, contrast medium extends into the left ureter. There is non filling of portions of the ureters. There are approximately 5 additional nonobstructive renal calculi associated with the left kidney lower pole moiety measuring up to 0.4 cm in diameter. There are at least 2 tiny nonobstructive calculi in the right kidney measuring up to 0.2 cm in diameter. There is also a small vascular calcification in the right renal hilum. No other filling  defects are identified along the urothelium. The adrenal glands appear normal.  Urinary bladder unremarkable. A left kidney upper pole cyst measures 19 Hounsfield units precontrast and 28 Hounsfield units postcontrast, accordingly not definitive for enhancement, appearance favoring benign cyst. A right kidney lower pole cyst measures 16 Hounsfield units precontrast and 20 Hounsfield units postcontrast, likewise compatible with benign cyst. Other tiny hypodense lesions are present in the kidneys and probably cysts but technically too small to characterize. Stomach/Bowel: Mild sigmoid colon diverticulosis. Vascular/Lymphatic: Chronically stable wall thickening in the inferior abdominal aorta starting about at the level of the origin of the IMA and extending into the proximal common iliac arteries. No change back through 01/05/2014. 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 Reproductive: Small left hydrocele versus a large epididymal cyst. Other: No supplemental non-categorized findings. Musculoskeletal: Mild lower lumbar spondylosis and degenerative disc disease. IMPRESSION: 1. 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 2. 1.1 cm V-shaped calculus in the collecting system of the left lower pole moiety extending to the UPJ. There is mild wall thickening and mild wall enhancement in the lower pole collecting system probably due to local irritation/inflammation, but currently the lower pole moiety stone is not obstructive and there is no overt hydronephrosis. 3. Bilateral nonobstructive nephrolithiasis. 4. Bilateral renal cysts. 5. Chronically stable (since at  least 2015) wall thickening in the inferior abdominal aorta starting about at the level of the origin of the IMA and extending into the proximal common iliac arteries. This may reflect residua from prior  vasculitis, minimal retroperitoneal fibrosis, or chronic atherosclerosis. 6. Small left hydrocele versus a large epididymal cyst. 7. Mild sigmoid colon diverticulosis. 8. Mild lower lumbar spondylosis and degenerative disc disease. 9. Aortic atherosclerosis. Aortic Atherosclerosis (ICD10-I70.0). Electronically Signed   By: Van Clines M.D.   On: 06/08/2020 09:34   US THORACENTESIS ASP PLEURAL SPACE W/IMG GUIDE  Result Date: 05/24/2020 INDICATION: Increasing shortness of breath with left pleural effusion. Request for therapeutic thoracentesis. EXAM: ULTRASOUND GUIDED LEFT THORACENTESIS MEDICATIONS: 1% lidocaine 10 mL COMPLICATIONS: None immediate. PROCEDURE: An ultrasound guided thoracentesis was thoroughly discussed with the patient and questions answered. The benefits, risks, alternatives and complications were also discussed. The patient understands and wishes to proceed with the procedure. Written consent was obtained. Ultrasound was performed to localize and mark an adequate pocket of fluid in the left chest. The area was then prepped and draped in the normal sterile fashion. 1% Lidocaine was used for local anesthesia. Under ultrasound guidance a 6 Fr Safe-T-Centesis catheter was introduced. Thoracentesis was performed. The catheter was removed and a dressing applied. FINDINGS: A total of approximately 600 mL of hazy yellow fluid was removed. IMPRESSION: Successful ultrasound guided left thoracentesis yielding 600 mL of pleural fluid. No pneumothorax on post-procedure chest x-ray. Read by: Gareth Eagle, PA-C Electronically Signed   By: Markus Daft M.D.   On: 05/24/2020 09:55      Assessment and Plan: Patient Active Problem List   Diagnosis Date Noted  . Pulmonary nodules 05/28/2020  . Atrophic kidney, acquired 05/28/2020  . Renal calculus, left 05/28/2020  . Aortic atherosclerosis ([place]) 05/28/2020  . Poison oak dermatitis 05/28/2020  . Encounter for general adult medical examination with  abnormal findings 04/22/2018  . Tick bite of abdominal wall 04/22/2018  . Candida rash of groin 04/22/2018  . PAD (peripheral artery disease) ([place]) 01/30/2018  . Back pain 04/21/2014  . Rheumatoid arthritis with rheumatoid factor ([place]) 04/21/2014   1. Pleural effusion, not elsewhere classified Stable, s/p thoracentesis 8/24 with 600 mL removed, benign cytology. RA likely contributing to pleural effusions. Will continue with routine monitoring at this time.  2. Pulmonary nodules Stable, will need repeat CT for monitoring in 1 year.  3. Essential hypertension, benign BP elevated today, advised to monitor BP at home to follow-up with PCP.  4. Flu vaccine need - Flu Vaccine MDCK QUAD PF   General Counseling: I have discussed the findings of the evaluation and examination with Nicole Kindred.  I have also discussed any further diagnostic evaluation thatmay be needed or ordered today. Laura verbalizes understanding of the findings of todays visit. We also reviewed his medications today and discussed drug interactions and side effects including but not limited excessive drowsiness and altered mental states. We also discussed that there is always a risk not just to him but also people around him. he has been encouraged to call the office with any questions or concerns that should arise related to todays visit.  Orders Placed This Encounter  Procedures  . Flu Vaccine MDCK QUAD PF     Time spent: 30  I have personally obtained a history, examined the patient, evaluated laboratory and imaging results, formulated the assessment and plan and placed orders. This patient was seen by Casey Burkitt AGNP-C in Collaboration with Dr. Devona Konig as a part  of collaborative care agreement.    Allyne Gee, MD [facility] Sleep medicine

## 2020-06-22 ENCOUNTER — Encounter: Payer: Self-pay | Admitting: Internal Medicine

## 2020-06-22 ENCOUNTER — Telehealth: Payer: Self-pay

## 2020-06-22 ENCOUNTER — Encounter: Payer: Self-pay | Admitting: *Deleted

## 2020-06-22 NOTE — Patient Instructions (Signed)
Pleural Effusion Pleural effusion is an abnormal buildup of fluid in the layers of tissue between the lungs and the inside of the chest (pleural space) The two layers of tissue that line the lungs and the inside of the chest are called pleura. Usually, there is no air in the space between the pleura, only a thin layer of fluid. Some conditions can cause a large amount of fluid to build up, which can cause the lung to collapse if untreated. A pleural effusion is usually caused by another disease that requires treatment. What are the causes? Pleural effusion can be caused by:  Heart failure.  Certain infections, such as pneumonia or tuberculosis.  Cancer.  A blood clot in the lung (pulmonary embolism).  Complications from surgery, such as from open heart surgery.  Liver disease (cirrhosis).  Kidney disease. What are the signs or symptoms? In some cases, pleural effusion may cause no symptoms. If symptoms are present, they may include:  Shortness of breath, especially when lying down.  Chest pain. This may get worse when taking a deep breath.  Fever.  Dry, long-lasting (chronic) cough.  Hiccups.  Rapid breathing. An underlying condition that is causing the pleural effusion (such as heart failure, pneumonia, blood clots, tuberculosis, or cancer) may also cause other symptoms. How is this diagnosed? This condition may be diagnosed based on:  Your symptoms and medical history.  A physical exam.  A chest X-ray.  A procedure to use a needle to remove fluid from the pleural space (thoracentesis). This fluid is tested.  Other imaging studies of the chest, such as ultrasound or CT scan. How is this treated? Depending on the cause of your condition, treatment may include:  Treating the underlying condition that is causing the effusion. When that condition improves, the effusion will also improve. Examples of treatment for underlying conditions include: ? Antibiotic medicines to  treat an infection. ? Diuretics or other heart medicines to treat heart failure.  Thoracentesis.  Placing a thin flexible tube under your skin and into your chest to continuously drain the effusion (indwelling pleural catheter).  Surgery to remove the outer layer of tissue from the pleural space (decortication).  A procedure to put medicine into the chest cavity to seal the pleural space and prevent fluid buildup (pleurodesis).  Chemotherapy and radiation therapy, if you have cancerous (malignant) pleural effusion. These treatments are typically used to treat cancer. They kill certain cells in the body. Follow these instructions at home:  Take over-the-counter and prescription medicines only as told by your health care provider.  Ask your health care provider what activities are safe for you.  Keep track of how long you are able to do mild exercise (such as walking) before you get short of breath. Write down this information to share with your health care provider. Your ability to exercise should improve over time.  Do not use any products that contain nicotine or tobacco, such as cigarettes and e-cigarettes. If you need help quitting, ask your health care provider.  Keep all follow-up visits as told by your health care provider. This is important. Contact a health care provider if:  The amount of time that you are able to do mild exercise: ? Decreases. ? Does not improve with time.  You have a fever. Get help right away if:  You are short of breath.  You develop chest pain.  You develop a new cough. Summary  Pleural effusion is an abnormal buildup of fluid in the layers   of tissue between the lungs and the inside of the chest.  Pleural effusion can have many causes, including heart failure, pulmonary embolism, infections, or cancer.  Symptoms of pleural effusion can include shortness of breath, chest pain, fever, long-lasting (chronic) cough, hiccups, or rapid  breathing.  Diagnosis often involves making images of the chest (such as with ultrasound or X-ray) and removing fluid (thoracentesis) to send for testing.  Treatment for pleural effusion depends on what underlying condition is causing it. This information is not intended to replace advice given to you by your health care provider. Make sure you discuss any questions you have with your health care provider. Document Revised: 08/30/2017 Document Reviewed: 05/23/2017 Elsevier Patient Education  2020 [employer]  

## 2020-06-22 NOTE — Telephone Encounter (Signed)
CONFIRMED PT APPT FOR 06/24/20

## 2020-06-23 LAB — FUNGUS CULTURE WITH STAIN

## 2020-06-23 LAB — FUNGAL ORGANISM REFLEX

## 2020-06-23 LAB — FUNGUS CULTURE RESULT

## 2020-06-24 ENCOUNTER — Ambulatory Visit: Payer: Medicare Other

## 2020-06-24 ENCOUNTER — Other Ambulatory Visit: Payer: Self-pay

## 2020-06-24 DIAGNOSIS — I700 Atherosclerosis of aorta: Secondary | ICD-10-CM

## 2020-06-27 ENCOUNTER — Ambulatory Visit: Payer: Medicare Other | Admitting: Adult Health

## 2020-06-29 NOTE — Progress Notes (Signed)
Reviewed

## 2020-07-07 LAB — ACID FAST CULTURE WITH REFLEXED SENSITIVITIES (MYCOBACTERIA): Acid Fast Culture: NEGATIVE

## 2020-08-03 ENCOUNTER — Other Ambulatory Visit: Payer: Self-pay

## 2020-08-03 DIAGNOSIS — N200 Calculus of kidney: Secondary | ICD-10-CM

## 2020-08-03 MED ORDER — TAMSULOSIN HCL 0.4 MG PO CAPS: 0.4000 mg | ORAL_CAPSULE | Freq: Every day | ORAL | 2 refills | Status: DC

## 2020-08-18 ENCOUNTER — Ambulatory Visit: Payer: Medicare Other | Admitting: Urology

## 2020-08-18 ENCOUNTER — Encounter: Payer: Self-pay | Admitting: Urology

## 2020-09-04 ENCOUNTER — Other Ambulatory Visit: Payer: Self-pay | Admitting: Hospice and Palliative Medicine

## 2020-09-04 DIAGNOSIS — N200 Calculus of kidney: Secondary | ICD-10-CM

## 2020-09-19 ENCOUNTER — Emergency Department: Payer: Medicare Other

## 2020-09-19 ENCOUNTER — Other Ambulatory Visit: Payer: Self-pay

## 2020-09-19 ENCOUNTER — Encounter: Payer: Self-pay | Admitting: *Deleted

## 2020-09-19 DIAGNOSIS — Z7982 Long term (current) use of aspirin: Secondary | ICD-10-CM | POA: Insufficient documentation

## 2020-09-19 DIAGNOSIS — F17290 Nicotine dependence, other tobacco product, uncomplicated: Secondary | ICD-10-CM | POA: Diagnosis not present

## 2020-09-19 DIAGNOSIS — R935 Abnormal findings on diagnostic imaging of other abdominal regions, including retroperitoneum: Secondary | ICD-10-CM | POA: Insufficient documentation

## 2020-09-19 DIAGNOSIS — R079 Chest pain, unspecified: Secondary | ICD-10-CM | POA: Diagnosis not present

## 2020-09-19 DIAGNOSIS — I10 Essential (primary) hypertension: Secondary | ICD-10-CM | POA: Diagnosis not present

## 2020-09-19 DIAGNOSIS — Z79899 Other long term (current) drug therapy: Secondary | ICD-10-CM | POA: Diagnosis not present

## 2020-09-19 LAB — BASIC METABOLIC PANEL
Anion gap: 11 (ref 5–15)
BUN: 17 mg/dL (ref 6–20)
CO2: 17 mmol/L — ABNORMAL LOW (ref 22–32)
Calcium: 8.8 mg/dL — ABNORMAL LOW (ref 8.9–10.3)
Chloride: 112 mmol/L — ABNORMAL HIGH (ref 98–111)
Creatinine, Ser: 0.99 mg/dL (ref 0.61–1.24)
GFR, Estimated: >60 mL/min (ref >60)
Glucose, Bld: 143 mg/dL — ABNORMAL HIGH (ref 70–99)
Potassium: 3.4 mmol/L — ABNORMAL LOW (ref 3.5–5.1)
Sodium: 140 mmol/L (ref 135–145)

## 2020-09-19 LAB — CBC
HCT: 39.2 % (ref 39.0–52.0)
Hemoglobin: 13.2 g/dL (ref 13.0–17.0)
MCH: 30.9 pg (ref 26.0–34.0)
MCHC: 33.7 g/dL (ref 30.0–36.0)
MCV: 91.8 fL (ref 80.0–100.0)
Platelets: 311 10*3/uL (ref 150–400)
RBC: 4.27 MIL/uL (ref 4.22–5.81)
RDW: 14.8 % (ref 11.5–15.5)
WBC: 12 10*3/uL — ABNORMAL HIGH (ref 4.0–10.5)
nRBC: 0 % (ref 0.0–0.2)

## 2020-09-19 LAB — TROPONIN I (HIGH SENSITIVITY): Troponin I (High Sensitivity): 8 ng/L (ref <18)

## 2020-09-19 NOTE — ED Triage Notes (Signed)
Central chest pressure, on and off for 3-4 days. Pain radiates up through both shoulders. Associated with SOB.

## 2020-09-20 ENCOUNTER — Encounter: Payer: Self-pay | Admitting: Radiology

## 2020-09-20 ENCOUNTER — Emergency Department
Admission: EM | Admit: 2020-09-20 | Discharge: 2020-09-20 | Disposition: A | Discharge status: Home / Self Care | Payer: Medicare Other | Attending: Emergency Medicine | Admitting: Emergency Medicine

## 2020-09-20 ENCOUNTER — Emergency Department: Payer: Medicare Other

## 2020-09-20 DIAGNOSIS — R079 Chest pain, unspecified: Secondary | ICD-10-CM

## 2020-09-20 LAB — TROPONIN I (HIGH SENSITIVITY): Troponin I (High Sensitivity): 8 ng/L (ref <18)

## 2020-09-20 MED ORDER — IOHEXOL 350 MG/ML SOLN
75.0000 mL | Freq: Once | INTRAVENOUS | Status: AC | PRN
  Administered 2020-09-20: 04:00:00 75 mL via INTRAVENOUS

## 2020-09-20 MED ORDER — ONDANSETRON HCL 4 MG/2ML IJ SOLN
4.0000 mg | Freq: Once | INTRAMUSCULAR | Status: AC
  Administered 2020-09-20: 04:00:00 4 mg via INTRAVENOUS
  Filled 2020-09-20: qty 2

## 2020-09-20 MED ORDER — KETOROLAC TROMETHAMINE 30 MG/ML IJ SOLN
15.0000 mg | Freq: Once | INTRAMUSCULAR | Status: AC
  Administered 2020-09-20: 06:00:00 15 mg via INTRAVENOUS
  Filled 2020-09-20: qty 1

## 2020-09-20 MED ORDER — TRAMADOL HCL 50 MG PO TABS
50.0000 mg | ORAL_TABLET | Freq: Four times a day (QID) | ORAL | 0 refills | Status: DC | PRN
Start: 2020-09-20 — End: 2021-09-01

## 2020-09-20 MED ORDER — FENTANYL CITRATE (PF) 100 MCG/2ML IJ SOLN
50.0000 ug | Freq: Once | INTRAMUSCULAR | Status: AC
  Administered 2020-09-20: 04:00:00 50 ug via INTRAVENOUS
  Filled 2020-09-20: qty 2

## 2020-09-20 MED ORDER — IPRATROPIUM-ALBUTEROL 0.5-2.5 (3) MG/3ML IN SOLN
3.0000 mL | Freq: Once | RESPIRATORY_TRACT | Status: AC
  Administered 2020-09-20: 06:00:00 3 mL via RESPIRATORY_TRACT
  Filled 2020-09-20: qty 3

## 2020-09-20 NOTE — ED Provider Notes (Signed)
[facility] Emergency Department Provider Note  ____________________________________________  Time seen: Approximately 3:35 AM  I have reviewed the triage vital signs and the nursing notes.   HISTORY  Chief Complaint Chest Pain   HPI Jose Cuevas is a 59 y.o. male with a history of rheumatoid arthritis on methotrexate and Humira, hypertension, hyperlipidemia, gastroparesis who presents for evaluation of chest pain.  Patient reports having similar episode last week.  This one started at 7:30 PM.  Describes the pain as severe, sharp,  and constant.  He reports that the pain started the base of his skull, went down his neck then into bilateral shoulders across his rib cage and into his chest.  The pain is worse with movement.  He reports having a similar episode last week that lasted several hours but resolved without intervention.  No cough, no fever.  When the pain becomes severe he has difficulty breathing.  No wheezing, no personal family history of blood clots, no recent travel immobilization, no leg pain or swelling, no hemoptysis or exogenous hormones.  No history of COPD or emphysema.  Patient is a smoker.  Past Medical History:  Diagnosis Date  . Back pain   . Collagen vascular disease (HCC)    Rhematoid Arthritis hx.  . Constipation, chronic   . DDD (degenerative disc disease), cervical   . History of duodenal ulcer   . History of kidney stones   . Hyperlipidemia   . Hypertension   . Low kidney function    LT Kidney non-functioning  . Nondiabetic gastroparesis   . Peripheral blood vessel disorder ([place])   . Rheumatoid arthritis with rheumatoid factor (HCC)   . Sleep apnea   . Weight loss     Patient Active Problem List   Diagnosis Date Noted  . Pulmonary nodules 05/28/2020  . Atrophic kidney, acquired 05/28/2020  . Renal calculus, left 05/28/2020  . Aortic atherosclerosis ([place]) 05/28/2020  . Poison oak dermatitis 05/28/2020  . Encounter for  general adult medical examination with abnormal findings 04/22/2018  . Tick bite of abdominal wall 04/22/2018  . Candida rash of groin 04/22/2018  . PAD (peripheral artery disease) ([place]) 01/30/2018  . Back pain 04/21/2014  . Rheumatoid arthritis with rheumatoid factor ([place]) 04/21/2014    Past Surgical History:  Procedure Laterality Date  . BACK SURGERY    . COLONOSCOPY WITH PROPOFOL N/A 11/11/2015   Procedure: COLONOSCOPY WITH PROPOFOL;  Surgeon: [name], MD;  Location: [facility] ENDOSCOPY;  Service: Endoscopy;  Laterality: N/A;  . COLONOSCOPY WITH PROPOFOL N/A 12/02/2015   Procedure: COLONOSCOPY WITH PROPOFOL;  Surgeon: [name], MD;  Location: [facility] ENDOSCOPY;  Service: Endoscopy;  Laterality: N/A;  . COLONOSCOPY WITH PROPOFOL N/A 09/16/2019   Procedure: COLONOSCOPY WITH PROPOFOL;  Surgeon: Toledo, Benay Pike, MD;  Location: [facility] ENDOSCOPY;  Service: Gastroenterology;  Laterality: N/A;  . ESOPHAGOGASTRODUODENOSCOPY (EGD) WITH PROPOFOL N/A 11/11/2015   Procedure: ESOPHAGOGASTRODUODENOSCOPY (EGD) WITH PROPOFOL;  Surgeon: [name], MD;  Location: [facility] ENDOSCOPY;  Service: Endoscopy;  Laterality: N/A;  . ESOPHAGOGASTRODUODENOSCOPY (EGD) WITH PROPOFOL N/A 12/02/2015   Procedure: ESOPHAGOGASTRODUODENOSCOPY (EGD) WITH PROPOFOL;  Surgeon: [name], MD;  Location: [facility] ENDOSCOPY;  Service: Endoscopy;  Laterality: N/A;  . ESOPHAGOGASTRODUODENOSCOPY (EGD) WITH PROPOFOL N/A 01/20/2016   Procedure: ESOPHAGOGASTRODUODENOSCOPY (EGD) WITH PROPOFOL;  Surgeon: [name], MD;  Location: [facility] ENDOSCOPY;  Service: Endoscopy;  Laterality: N/A;  . ESOPHAGOGASTRODUODENOSCOPY (EGD) WITH PROPOFOL N/A 09/16/2019   Procedure: ESOPHAGOGASTRODUODENOSCOPY (EGD) WITH PROPOFOL;  Surgeon: Toledo, Benay Pike, MD;  Location: [facility] ENDOSCOPY;  Service: Gastroenterology;  Laterality: N/A;  . KNEE RECONSTRUCTION Left     Prior to Admission medications   Medication Sig Start Date End  Date Taking? Authorizing Provider  Aspirin-Caffeine (BC FAST PAIN RELIEF ARTHRITIS PO) Take by mouth.    [provider]  folic acid (FOLVITE) 1 MG tablet Take 1 mg by mouth daily.    [provider]  HUMIRA PEN 40 MG/0.4ML PNKT SMARTSIG:40 Milligram(s) SUB-Q Every 2 Weeks 05/20/20   [provider]  losartan-hydrochlorothiazide (HYZAAR) 100-25 MG tablet Take 1 tablet by mouth daily. 05/19/20   Kendell Bane, NP  methadone (DOLOPHINE) 10 MG tablet Take 10 mg by mouth 4 (four) times daily.     [provider]  methotrexate 50 MG/2ML injection Inject 50 mg/m2 into the vein once. 0.71mls once every 2 weeks    [provider]  pantoprazole (PROTONIX) 40 MG tablet Take 40 mg by mouth 2 (two) times daily.    [provider]  tamsulosin (FLOMAX) 0.4 MG CAPS capsule TAKE 1 CAPSULE BY MOUTH EVERY DAY 09/04/20   Lavera Guise, MD  traMADol (ULTRAM) 50 MG tablet Take 1 tablet (50 mg total) by mouth every 6 (six) hours as needed. 09/20/20 09/20/21  Rudene Re, MD  triamcinolone (KENALOG) 0.025 % cream Apply 1 application topically 2 (two) times daily. Patient not taking: Reported on 06/21/2020 05/11/20   Ronnell Freshwater, NP  vitamin B-12 (CYANOCOBALAMIN) 1000 MCG tablet Take 1,000 mcg by mouth daily.    [provider]    Allergies Patient has no known allergies.  Family History  Problem Relation Age of Onset  . Diabetes Mother   . Diabetes Father   . Heart attack Maternal Grandmother     Social History Social History   Tobacco Use  . Smoking status: Current Some Day Smoker    Years: 2.00    Types: Cigars  . Smokeless tobacco: Never Used  . Tobacco comment: Occasional  3 a day  Vaping Use  . Vaping Use: Never used  Substance Use Topics  . Alcohol use: No  . Drug use: No    Review of Systems  Constitutional: Negative for fever. Eyes: Negative for visual changes. ENT: Negative for sore throat. Neck: No neck pain   Cardiovascular: + chest pain. Respiratory: + shortness of breath. Gastrointestinal: Negative for abdominal pain, vomiting or diarrhea. Genitourinary: Negative for dysuria. Musculoskeletal: Negative for back pain. Skin: Negative for rash. Neurological: Negative for headaches, weakness or numbness. Psych: No SI or HI  ____________________________________________   PHYSICAL EXAM:  VITAL SIGNS: ED Triage Vitals  Enc Vitals Group     BP 09/19/20 2043 (!) 164/71     Pulse Rate 09/19/20 2043 90     Resp 09/19/20 2043 20     Temp 09/19/20 2043 99.2 F (37.3 C)     Temp Source 09/19/20 2043 Oral     SpO2 09/19/20 2043 98 %     Weight 09/19/20 2041 133 lb (60.3 kg)     Height 09/19/20 2041 5\' 8"  (1.727 m)     Head Circumference --      Peak Flow --      Pain Score 09/19/20 2041 8     Pain Loc --      Pain Edu? --      Excl. in [place]? --     Constitutional: Alert and oriented. Cachectic in no distress. HEENT:  Head: Normocephalic and atraumatic.         Eyes: Conjunctivae are normal. Sclera is non-icteric.       Mouth/Throat: Mucous membranes are moist.       Neck: Supple with no signs of meningismus. Cardiovascular: Regular rate and rhythm. No murmurs, gallops, or rubs. 2+ symmetrical distal pulses are present in all extremities. No JVD. Respiratory: Normal respiratory effort.  Severely diminished air movement bilaterally with no wheezing or crackle Gastrointestinal: Soft, non tender, and non distended with positive bowel sounds. No rebound or guarding. Genitourinary: No CVA tenderness. Musculoskeletal: No edema, cyanosis, or erythema of extremities. Neurologic: Normal speech and language. Face is symmetric. Moving all extremities. No gross focal neurologic deficits are appreciated. Skin: Skin is warm, dry and intact. No rash noted. Psychiatric: Mood and affect are normal. Speech and behavior are normal.  ____________________________________________   LABS (all labs  ordered are listed, but only abnormal results are displayed)  Labs Reviewed  BASIC METABOLIC PANEL - Abnormal; Notable for the following components:      Result Value   Potassium 3.4 (*)    Chloride 112 (*)    CO2 17 (*)    Glucose, Bld 143 (*)    Calcium 8.8 (*)    All other components within normal limits  CBC - Abnormal; Notable for the following components:   WBC 12.0 (*)    All other components within normal limits  TROPONIN I (HIGH SENSITIVITY)  TROPONIN I (HIGH SENSITIVITY)   ____________________________________________  EKG  ED ECG REPORT I, Rudene Re, the attending physician, personally viewed and interpreted this ECG.  Normal sinus rhythm, LVH, rate of 87, normal intervals, normal axis, no ST elevations.  No significant changes when compared to prior. ____________________________________________  RADIOLOGY  I have personally reviewed the images performed during this visit and I agree with the Radiologist's read.   Interpretation by Radiologist:  DG Chest 2 View  Result Date: 09/19/2020 CLINICAL DATA:  Chest pressure. EXAM: CHEST - 2 VIEW COMPARISON:  May 24, 2020 FINDINGS: Mild atelectasis and/or infiltrate is seen within the left lung base. There is a small left pleural effusion. No pneumothorax is identified. The heart size and mediastinal contours are within normal limits. The visualized skeletal structures are unremarkable. IMPRESSION: 1. Mild left basilar atelectasis and/or infiltrate. 2. Small left pleural effusion. Electronically Signed   By: Virgina Norfolk M.D.   On: 09/19/2020 21:09   CT Angio Chest/Abd/Pel for Dissection W and/or Wo Contrast  Result Date: 09/20/2020 CLINICAL DATA:  On and off chest pressure for 3-4 days EXAM: CT ANGIOGRAPHY CHEST, ABDOMEN AND PELVIS TECHNIQUE: Non-contrast CT of the chest was initially obtained. Multidetector CT imaging through the chest, abdomen and pelvis was performed using the standard protocol during  bolus administration of intravenous contrast. Multiplanar reconstructed images and MIPs were obtained and reviewed to evaluate the vascular anatomy. CONTRAST:  9mL OMNIPAQUE IOHEXOL 350 MG/ML SOLN COMPARISON:  Abdominal CT 06/08/2020.  Chest CT 05/10/2020 FINDINGS: CTA CHEST FINDINGS Cardiovascular: Accounting for motion artifact the noncontrast phase shows no acute intramural hematoma. No aortic dissection or aneurysm. There is aortic and great vessel atherosclerosis. Three vessel arch branching with common origins of the brachiocephalic and left common carotid. The left vertebral artery arises from the arch. Normal heart size. Trace pericardial fluid which was not seen on prior but may be physiologic. Coronary calcifications seen at the LAD at least. Mediastinum/Nodes: Prominent hilar lymph nodes measuring up to 9 mm short axis on the left,  stable and likely reactive. Lungs/Pleura: Chronic and loculated appearing small left pleural effusion with adjacent mild pulmonary scarring or atelectasis. There are scattered areas of subpleural reticulation and honeycombing. Stable solid right upper lobe pulmonary nodule measuring 5 mm in average diameter. Centrilobular emphysema. There is no edema, consolidation, effusion, or pneumothorax. Musculoskeletal: No acute or aggressive finding. Review of the MIP images confirms the above findings. CTA ABDOMEN AND PELVIS FINDINGS VASCULAR Aorta: Atheromatous wall thickening. Chronic soft tissue rind around the infrarenal aorta continuing along the proximal iliacs. No progression of the soft tissue. No flow limiting stenosis or aneurysm Celiac: Moderate narrowing at the origin with quantification limited by early branching. No acute finding or branch occlusion SMA: Chronic moderate (55%) proximal atheromatous narrowing. No branch occlusion or dissection Renals: Small left renal artery which is likely hypertrophied. Atherosclerosis of the right renal artery. IMA: Patent despite soft  tissue cuffing Inflow: Diffuse atheromatous calcification of the iliacs with asymmetric irregularity at the left common iliac bifurcation, stable. The soft tissue cuffing encompasses the iliac arteries, as before. Left common iliac artery stenosis measures at least 60% Veins: Unremarkable in the arterial phase. Review of the MIP images confirms the above findings. NON-VASCULAR Hepatobiliary: No focal liver abnormality.No evidence of biliary obstruction or stone. Pancreas: Unremarkable. Spleen: Unremarkable. Adrenals/Urinary Tract: Negative adrenals. Left duplicated urinary collecting system. Left renal atrophy, especially severe the upper pole moiety. Chronic calculus without hydronephrosis at the lower pole moiety UPJ, measuring up to 11 mm in length. Unremarkable right kidney other than small simple appearing cysts. Unremarkable bladder. Stomach/Bowel:  No obstruction. No visible bowel inflammation Lymphatic: No mass or adenopathy. Reproductive:Unremarkable Other: No ascites or pneumoperitoneum. Musculoskeletal: No acute abnormalities. Lumbar spine degeneration. Osteopenic appearance for age. Review of the MIP images confirms the above findings. IMPRESSION: 1. No acute finding including evidence of acute aortic syndrome. 2. Atherosclerosis with chronic branch narrowings described above. Coronary atherosclerosis. 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 4. Severe left renal atrophy. 5. Chronic, small and loculated left pleural effusion. 6. Emphysema. 7. 5 mm right upper lobe pulmonary nodule. Non-contrast chest CT can be considered in 12 months if patient is high-risk. This recommendation follows the consensus statement: Guidelines for Management of Incidental Pulmonary Nodules Detected on CT Images: From the Fleischner Society 2017; Radiology 2017; 284:228-243. Aortic Atherosclerosis (ICD10-I70.0) and Emphysema (ICD10-J43.9). Electronically Signed   By: Monte Fantasia M.D.   On: 09/20/2020 04:35      ____________________________________________   PROCEDURES  Procedure(s) performed:yes .1-3 Lead EKG Interpretation Performed by: Rudene Re, MD Authorized by: Rudene Re, MD     Interpretation: abnormal     ECG rate assessment: normal     Rhythm: sinus rhythm     Critical Care performed:  None ____________________________________________   INITIAL IMPRESSION / ASSESSMENT AND PLAN / ED COURSE  59 y.o. male with a history of rheumatoid arthritis on methotrexate and Humira, hypertension, hyperlipidemia, gastroparesis who presents for evaluation of chest pain.  Description of the pain is severe, sharp, constant, starts at the base of the skull goes down the neck, to bilateral shoulders across the rib cage onto the chest associated with shortness of breath.  Pain has been going on for over 8 hours.  Patient is chronically ill-appearing, cachectic but in no obvious distress, normal work of breathing with severely diminished air movement bilaterally.  He is a smoker.  Afebrile, no tachycardia, no tachypnea, no hypoxia, abdomen is soft with no palpable masses, or tenderness.  Ddx arthritis pain, PTX, PE, dissection,  PNA, pleurisy, costochondritis.  EKG unchanged from baseline with 2 - high-sensitivity troponin.  Chest x-ray visualized by me with mild left basilar infiltrate versus atelectasis, confirmed by radiology.  Patient has had chronic left-sided pleural effusions which are attributed to his rheumatoid arthritis.  Has had thoracentesis a few times per review of old medical records.  Today the pleural effusion looks markedly smaller than prior.  We will get a CT Angie of the chest.  We will treat his pain with IV fentanyl and Zofran.  History gathered from him and his wife was at bedside.  Plan discussed with both of them.  Old medical records reviewed  _________________________ 4:43 AM on 09/20/2020 -----------------------------------------  CT of the chest with no  signs of PE or dissection and no other acute abnormalities.  Visualized by me confirmed by radiology.  At this point with negative CT, negative EKG, negative troponin in spite of 8 hours of pain I do believe his pain is most likely due to his rheumatoid arthritis.  Will give a dose of IV Toradol and reassess  _________________________ 6:58 AM on 09/20/2020 -----------------------------------------  Patient's pain has fully resolved.  Will discharge home with a short course of tramadol recommended for close follow-up with his rheumatologist for possible pain associated with arthritis.  Discussed my standard return precautions.  Results and plan discussed with patient and his wife was at bedside.    _____________________________________________ Please note:  Patient was evaluated in Emergency Department today for the symptoms described in the history of present illness. Patient was evaluated in the context of the global COVID-19 pandemic, which necessitated consideration that the patient might be at risk for infection with the SARS-CoV-2 virus that causes COVID-19. Institutional protocols and algorithms that pertain to the evaluation of patients at risk for COVID-19 are in a state of rapid change based on information released by regulatory bodies including the CDC and federal and state organizations. These policies and algorithms were followed during the patient's care in the ED.  Some ED evaluations and interventions may be delayed as a result of limited staffing during the pandemic.   [place] Controlled Substance Database was reviewed by me. ____________________________________________   FINAL CLINICAL IMPRESSION(S) / ED DIAGNOSES   Final diagnoses:  Chest pain, unspecified type      NEW MEDICATIONS STARTED DURING THIS VISIT:  ED Discharge Orders         Ordered    traMADol (ULTRAM) 50 MG tablet  Every 6 hours PRN        09/20/20 [id number]           Note:  This document was prepared using  Dragon voice recognition software and may include unintentional dictation errors.    Alfred Levins, [place], MD 09/20/20 [phone]

## 2020-09-20 NOTE — Discharge Instructions (Addendum)
Follow up with your doctor for further evaluation.  Take tramadol as needed for the pain.  Return to the emergency room for new or worsening pain, shortness of breath, abdominal pain, or fever.

## 2020-09-20 NOTE — ED Notes (Signed)
Pt to Ct

## 2020-10-25 ENCOUNTER — Encounter: Payer: Self-pay | Admitting: Internal Medicine

## 2020-10-25 ENCOUNTER — Other Ambulatory Visit: Payer: Self-pay

## 2020-10-25 ENCOUNTER — Ambulatory Visit (INDEPENDENT_AMBULATORY_CARE_PROVIDER_SITE_OTHER): Payer: Medicare Other | Admitting: Internal Medicine

## 2020-10-25 VITALS — BP 115/74 | HR 99 | Temp 97.5°F | Resp 16 | Ht 68.0 in | Wt 133.0 lb

## 2020-10-25 DIAGNOSIS — M810 Age-related osteoporosis without current pathological fracture: Secondary | ICD-10-CM | POA: Diagnosis not present

## 2020-10-25 DIAGNOSIS — F329 Major depressive disorder, single episode, unspecified: Secondary | ICD-10-CM

## 2020-10-25 DIAGNOSIS — J209 Acute bronchitis, unspecified: Secondary | ICD-10-CM

## 2020-10-25 DIAGNOSIS — J440 Chronic obstructive pulmonary disease with acute lower respiratory infection: Secondary | ICD-10-CM | POA: Diagnosis not present

## 2020-10-25 DIAGNOSIS — Z79899 Other long term (current) drug therapy: Secondary | ICD-10-CM

## 2020-10-25 DIAGNOSIS — Z79891 Long term (current) use of opiate analgesic: Secondary | ICD-10-CM

## 2020-10-25 DIAGNOSIS — G894 Chronic pain syndrome: Secondary | ICD-10-CM | POA: Diagnosis not present

## 2020-10-25 DIAGNOSIS — R0602 Shortness of breath: Secondary | ICD-10-CM

## 2020-10-25 NOTE — Progress Notes (Signed)
[facility] [place], [place] [address]  Internal MEDICINE  Office Visit Note  Patient Name: Jose Cuevas  [id number]  [id number]  Date of Service: 11/01/2020  Chief Complaint  Patient presents with  . Follow-up    No energy   . Hyperlipidemia  . Hypertension    HPI Pt is here for follow up. C/o having no energy, mood is down and sad, no appetite and feels depressed. Pt has chronic pain and has dependence on narcotics. Seen by cardiology, schedule for stress test and echo. He was told it might be lung problem. He has recent CT angio shows the following  1. No acute finding including evidence of acute aortic syndrome. 2. Atherosclerosis with chronic branch narrowings described above.Coronary atherosclerosis. 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 4. Severe left renal atrophy. 5. Chronic, small and loculated left pleural effusion. 6. Emphysema. 7. 5 mm right upper lobe pulmonary nodule. Non-contrast chest CT can be considered in 12 months if patient is high-risk  Current Medication: Outpatient Encounter Medications as of 10/25/2020  Medication Sig Note  . DULoxetine (CYMBALTA) 30 MG capsule Take 1 capsule (30 mg total) by mouth daily. For depression   . mirtazapine (REMERON) 15 MG tablet Take 0.5 tablets (7.5 mg total) by mouth at bedtime.   . Aspirin-Caffeine (BC FAST PAIN RELIEF ARTHRITIS PO) Take by mouth.   . folic acid (FOLVITE) 1 MG tablet Take 1 mg by mouth daily.   Marland Kitchen HUMIRA PEN 40 MG/0.4ML PNKT SMARTSIG:40 Milligram(s) SUB-Q Every 2 Weeks   . losartan-hydrochlorothiazide (HYZAAR) 100-25 MG tablet Take 1 tablet by mouth daily.   . methotrexate 50 MG/2ML injection Inject 50 mg/m2 into the vein once. 0.42mls once every 2 weeks 06/19/2019: Wednesday   . pantoprazole (PROTONIX) 40 MG tablet Take 40 mg by mouth 2 (two) times daily.   . tamsulosin (FLOMAX) 0.4 MG CAPS capsule TAKE 1 CAPSULE BY MOUTH EVERY DAY   . traMADol (ULTRAM) 50 MG tablet  Take 1 tablet (50 mg total) by mouth every 6 (six) hours as needed.   . vitamin B-12 (CYANOCOBALAMIN) 1000 MCG tablet Take 1,000 mcg by mouth daily.   . [DISCONTINUED] methadone (DOLOPHINE) 10 MG tablet Take 10 mg by mouth 4 (four) times daily.    . [DISCONTINUED] triamcinolone (KENALOG) 0.025 % cream Apply 1 application topically 2 (two) times daily. (Patient not taking: Reported on 06/21/2020)    No facility-administered encounter medications on file as of 10/25/2020.    Surgical History: Past Surgical History:  Procedure Laterality Date  . BACK SURGERY    . COLONOSCOPY WITH PROPOFOL N/A 11/11/2015   Procedure: COLONOSCOPY WITH PROPOFOL;  Surgeon: [name], MD;  Location: [facility] ENDOSCOPY;  Service: Endoscopy;  Laterality: N/A;  . COLONOSCOPY WITH PROPOFOL N/A 12/02/2015   Procedure: COLONOSCOPY WITH PROPOFOL;  Surgeon: [name], MD;  Location: [facility] ENDOSCOPY;  Service: Endoscopy;  Laterality: N/A;  . COLONOSCOPY WITH PROPOFOL N/A 09/16/2019   Procedure: COLONOSCOPY WITH PROPOFOL;  Surgeon: Toledo, Benay Pike, MD;  Location: [facility] ENDOSCOPY;  Service: Gastroenterology;  Laterality: N/A;  . ESOPHAGOGASTRODUODENOSCOPY (EGD) WITH PROPOFOL N/A 11/11/2015   Procedure: ESOPHAGOGASTRODUODENOSCOPY (EGD) WITH PROPOFOL;  Surgeon: [name], MD;  Location: [facility] ENDOSCOPY;  Service: Endoscopy;  Laterality: N/A;  . ESOPHAGOGASTRODUODENOSCOPY (EGD) WITH PROPOFOL N/A 12/02/2015   Procedure: ESOPHAGOGASTRODUODENOSCOPY (EGD) WITH PROPOFOL;  Surgeon: [name], MD;  Location: [facility] ENDOSCOPY;  Service: Endoscopy;  Laterality: N/A;  . ESOPHAGOGASTRODUODENOSCOPY (EGD) WITH PROPOFOL N/A 01/20/2016  Procedure: ESOPHAGOGASTRODUODENOSCOPY (EGD) WITH PROPOFOL;  Surgeon: [name], MD;  Location: [facility] ENDOSCOPY;  Service: Endoscopy;  Laterality: N/A;  . ESOPHAGOGASTRODUODENOSCOPY (EGD) WITH PROPOFOL N/A 09/16/2019   Procedure: ESOPHAGOGASTRODUODENOSCOPY (EGD) WITH PROPOFOL;   Surgeon: Toledo, Benay Pike, MD;  Location: [facility] ENDOSCOPY;  Service: Gastroenterology;  Laterality: N/A;  . KNEE RECONSTRUCTION Left     Medical History: Past Medical History:  Diagnosis Date  . Back pain   . Collagen vascular disease (HCC)    Rhematoid Arthritis hx.  . Constipation, chronic   . DDD (degenerative disc disease), cervical   . History of duodenal ulcer   . History of kidney stones   . Hyperlipidemia   . Hypertension   . Low kidney function    LT Kidney non-functioning  . Nondiabetic gastroparesis   . Peripheral blood vessel disorder ([place])   . Rheumatoid arthritis with rheumatoid factor (HCC)   . Sleep apnea   . Weight loss     Family History: Family History  Problem Relation Age of Onset  . Diabetes Mother   . Diabetes Father   . Heart attack Maternal Grandmother     Social History   Socioeconomic History  . Marital status: Married    Spouse name: Not on file  . Number of children: Not on file  . Years of education: Not on file  . Highest education level: Not on file  Occupational History  . Not on file  Tobacco Use  . Smoking status: Current Some Day Smoker    Years: 2.00    Types: Cigars  . Smokeless tobacco: Never Used  . Tobacco comment: Occasional  3 a day  Vaping Use  . Vaping Use: Never used  Substance and Sexual Activity  . Alcohol use: No  . Drug use: No  . Sexual activity: Yes    Birth control/protection: None  Other Topics Concern  . Not on file  Social History Narrative  . Not on file   Social Determinants of Health   Financial Resource Strain: Not on file  Food Insecurity: Not on file  Transportation Needs: Not on file  Physical Activity: Not on file  Stress: Not on file  Social Connections: Not on file  Intimate Partner Violence: Not on file      Review of Systems  Constitutional: Positive for fatigue and unexpected weight change. Negative for chills.  HENT: Negative for congestion, rhinorrhea, sneezing and sore  throat.   Eyes: Negative for redness.  Respiratory: Positive for shortness of breath. Negative for cough and chest tightness.   Cardiovascular: Negative for chest pain and palpitations.  Gastrointestinal: Negative for abdominal pain, constipation, diarrhea, nausea and vomiting.  Genitourinary: Negative for dysuria and frequency.  Musculoskeletal: Positive for arthralgias and back pain. Negative for joint swelling and neck pain.  Skin: Negative for rash.  Neurological: Negative.  Negative for tremors and numbness.  Hematological: Negative for adenopathy. Does not bruise/bleed easily.  Psychiatric/Behavioral: Positive for decreased concentration, dysphoric mood and sleep disturbance. Negative for behavioral problems (Depression). The patient is not nervous/anxious.     Vital Signs: BP 115/74   Pulse 99   Temp (!) 97.5 F (36.4 C)   Resp 16   Ht 5\' 8"  (1.727 m)   Wt 133 lb (60.3 kg)   SpO2 99%   BMI 20.22 kg/m    Physical Exam Constitutional:      General: He is not in acute distress.    Appearance: He is well-developed. He is not diaphoretic.  HENT:     Head: Normocephalic and atraumatic.     Mouth/Throat:     Pharynx: No oropharyngeal exudate.  Eyes:     Pupils: Pupils are equal, round, and reactive to light.  Neck:     Thyroid: No thyromegaly.     Vascular: No JVD.     Trachea: No tracheal deviation.  Cardiovascular:     Rate and Rhythm: Normal rate and regular rhythm.     Heart sounds: Normal heart sounds. No murmur heard. No friction rub. No gallop.   Pulmonary:     Effort: Pulmonary effort is normal. No respiratory distress.     Breath sounds: No wheezing or rales.     Comments: Decreased breath sounds bilateral with ronchi  Chest:     Chest wall: No tenderness.  Abdominal:     General: Bowel sounds are normal.     Palpations: Abdomen is soft.  Musculoskeletal:        General: Normal range of motion.     Cervical back: Normal range of motion and neck supple.   Lymphadenopathy:     Cervical: No cervical adenopathy.  Skin:    General: Skin is warm and dry.  Neurological:     Mental Status: He is alert and oriented to person, place, and time.     Cranial Nerves: No cranial nerve deficit.  Psychiatric:        Thought Content: Thought content normal.        Judgment: Judgment normal.     Comments: Mood is depressed     Assessment/Plan: 1. COPD (chronic obstructive pulmonary disease) with acute bronchitis (HCC) - PR BREATHING CAPACITY TEST, showed mild COPD, CT lung findings are discussed  - 6 minute walk, no desaturation noticed, will get results of PFT's and start on MDI LABA// anticholinergics   2. Major depression, melancholic type Pt meets all the criteria for major depression, will start treatment, titarte as indicated  - DULoxetine (CYMBALTA) 30 MG capsule; Take 1 capsule (30 mg total) by mouth daily. For depression  Dispense: 30 capsule; Refill: 3 - mirtazapine (REMERON) 15 MG tablet; Take 0.5 tablets (7.5 mg total) by mouth at bedtime.  Dispense: 30 tablet; Refill: 2  3. Senile osteoporosis Chronic back pain, ?? H/O RA, needs bone density  - DG Bone Density; Future  4. Chronic pain syndrome Suspect polypharmacy, needs proper therapy, takes multiple pain pills a day, will need longer acting agent for better control  5. High risk medication use Chronic steroids intake ?? RA , will get records  - DG Bone Density; Future  6. Use of opiates for therapeutic purposes Per pain management, will need long acting agnets for better control  General Counseling: Jose Cuevas verbalizes understanding of the findings of todays visit and agrees with plan of treatment. I have discussed any further diagnostic evaluation that may be needed or ordered today. We also reviewed his medications today. he has been encouraged to call the office with any questions or concerns that should arise related to todays visit.    Orders Placed This Encounter   Procedures  . DG Bone Density  . 6 minute walk  . PR BREATHING CAPACITY TEST    Meds ordered this encounter  Medications  . DULoxetine (CYMBALTA) 30 MG capsule    Sig: Take 1 capsule (30 mg total) by mouth daily. For depression    Dispense:  30 capsule    Refill:  3  . mirtazapine (REMERON) 15 MG tablet  Sig: Take 0.5 tablets (7.5 mg total) by mouth at bedtime.    Dispense:  30 tablet    Refill:  2   What is lung cancer screening?   Lung cancer screening is a way in which doctors check the lungs for early signs of cancer in people who have no symptoms of lung cancer. Doctors suggest this screening for certain people who are at high risk of lung cancer because they smoke, or used to smoke. Although screening is not likely to be helpful for all smokers, doctors do think it might help prevent cancer deaths in people who smoke a lot or smoked for many years (even if they have already quit). Researchers have studied chest X-rays and "low-dose CT scans," two types of imaging tests, to see if they are good screening tools. Imaging tests create pictures of the inside of the body. A low-dose CT scan uses much less radiation than a typical CT scan and shows a more detailed image of the lungs than a standard X-ray. It turns out that chest X-rays do not work for screening for lung cancer. Low-dose CT scans, on the other hand, are helpful screening tools for some people at high risk of lung cancer. The goal of lung cancer screening is to find cancer early, before it has a chance to grow, spread, or cause problems. Several large studies found that smokers who were screened with low-dose CT scans were less likely to die of lung cancer than those who were screened with a standard X-ray.  The best way to lower your chances of getting or dying from lung cancer is to quit smoking. No matter how much or how long you have smoked, quitting is a good idea. Quitting now will reduce your chances not only of lung  problems, but also of heart disease and many forms of cancer.   Total time spent:50 Minutes Time spent includes review of chart, medications, test results, and follow up plan with the patient.   [place] Controlled Substance Database was reviewed by me for overdose risk score (ORS)  Dr Lavera Guise Internal medicine

## 2020-10-26 MED ORDER — MIRTAZAPINE 15 MG PO TABS: 7.5000 mg | ORAL_TABLET | Freq: Every day | ORAL | 2 refills | Status: DC

## 2020-10-26 MED ORDER — DULOXETINE HCL 30 MG PO CPEP
30.0000 mg | ORAL_CAPSULE | Freq: Every day | ORAL | 3 refills | Status: DC
Start: 2020-10-26 — End: 2020-11-21

## 2020-10-27 ENCOUNTER — Telehealth: Payer: Self-pay

## 2020-10-27 NOTE — Telephone Encounter (Signed)
Med list updated

## 2020-11-01 ENCOUNTER — Encounter: Payer: Self-pay | Admitting: Internal Medicine

## 2020-11-08 ENCOUNTER — Ambulatory Visit (INDEPENDENT_AMBULATORY_CARE_PROVIDER_SITE_OTHER): Payer: Medicare Other | Admitting: Internal Medicine

## 2020-11-08 ENCOUNTER — Encounter: Payer: Self-pay | Admitting: Internal Medicine

## 2020-11-08 VITALS — BP 166/96 | HR 91 | Temp 98.2°F | Resp 16 | Ht 68.0 in | Wt 132.4 lb

## 2020-11-08 DIAGNOSIS — R079 Chest pain, unspecified: Secondary | ICD-10-CM | POA: Diagnosis not present

## 2020-11-08 DIAGNOSIS — J440 Chronic obstructive pulmonary disease with acute lower respiratory infection: Secondary | ICD-10-CM | POA: Diagnosis not present

## 2020-11-08 DIAGNOSIS — J209 Acute bronchitis, unspecified: Secondary | ICD-10-CM

## 2020-11-08 DIAGNOSIS — J4520 Mild intermittent asthma, uncomplicated: Secondary | ICD-10-CM | POA: Diagnosis not present

## 2020-11-08 DIAGNOSIS — R0602 Shortness of breath: Secondary | ICD-10-CM | POA: Diagnosis not present

## 2020-11-08 DIAGNOSIS — J918 Pleural effusion in other conditions classified elsewhere: Secondary | ICD-10-CM

## 2020-11-08 NOTE — Patient Instructions (Signed)
Chronic Obstructive Pulmonary Disease  Chronic obstructive pulmonary disease (COPD) is a long-term (chronic) lung problem. When you have COPD, it is hard for air to get in and out of your lungs. Usually the condition gets worse over time, and your lungs will never return to normal. There are things you can do to keep yourself as healthy as possible. What are the causes?  Smoking. This is the most common cause.  Certain genes passed from parent to child (inherited). What increases the risk?  Being exposed to secondhand smoke from cigarettes, pipes, or cigars.  Being exposed to chemicals and other irritants, such as fumes and dust in the work environment.  Having chronic lung conditions or infections. What are the signs or symptoms?  Shortness of breath, especially during physical activity.  A long-term cough with a large amount of thick mucus. Sometimes, the cough may not have any mucus (dry cough).  Wheezing.  Breathing quickly.  Skin that looks gray or blue, especially in the fingers, toes, or lips.  Feeling tired (fatigue).  Weight loss.  Chest tightness.  Having infections often.  Episodes when breathing symptoms become much worse (exacerbations). At the later stages of this disease, you may have swelling in the ankles, feet, or legs. How is this treated?  Taking medicines.  Quitting smoking, if you smoke.  Rehabilitation. This includes steps to make your body work better. It may involve a team of specialists.  Doing exercises.  Making changes to your diet.  Using oxygen.  Lung surgery.  Lung transplant.  Comfort measures (palliative care). Follow these instructions at home: Medicines  Take over-the-counter and prescription medicines only as told by your doctor.  Talk to your doctor before taking any cough or allergy medicines. You may need to avoid medicines that cause your lungs to be dry. Lifestyle  If you smoke, stop smoking. Smoking makes the  problem worse.  Do not smoke or use any products that contain nicotine or tobacco. If you need help quitting, ask your doctor.  Avoid being around things that make your breathing worse. This may include smoke, chemicals, and fumes.  Stay active, but remember to rest as well.  Learn and use tips on how to manage stress and control your breathing.  Make sure you get enough sleep. Most adults need at least 7 hours of sleep every night.  Eat healthy foods. Eat smaller meals more often. Rest before meals. Controlled breathing Learn and use tips on how to control your breathing as told by your doctor. Try:  Breathing in (inhaling) through your nose for 1 second. Then, pucker your lips and breath out (exhale) through your lips for 2 seconds.  Putting one hand on your belly (abdomen). Breathe in slowly through your nose for 1 second. Your hand on your belly should move out. Pucker your lips and breathe out slowly through your lips. Your hand on your belly should move in as you breathe out.   Controlled coughing Learn and use controlled coughing to clear mucus from your lungs. Follow these steps: 1. Lean your head a little forward. 2. Breathe in deeply. 3. Try to hold your breath for 3 seconds. 4. Keep your mouth slightly open while coughing 2 times. 5. Spit any mucus out into a tissue. 6. Rest and do the steps again 1 or 2 times as needed. General instructions  Make sure you get all the shots (vaccines) that your doctor recommends. Ask your doctor about a flu shot and a pneumonia shot.    Use oxygen therapy and pulmonary rehabilitation if told by your doctor. If you need home oxygen therapy, ask your doctor if you should buy a tool to measure your oxygen level (oximeter).  Make a COPD action plan with your doctor. This helps you to know what to do if you feel worse than usual.  Manage any other conditions you have as told by your doctor.  Avoid going outside when it is very hot, cold, or  humid.  Avoid people who have a sickness you can catch (contagious).  Keep all follow-up visits. Contact a doctor if:  You cough up more mucus than usual.  There is a change in the color or thickness of the mucus.  It is harder to breathe than usual.  Your breathing is faster than usual.  You have trouble sleeping.  You need to use your medicines more often than usual.  You have trouble doing your normal activities such as getting dressed or walking around the house. Get help right away if:  You have shortness of breath while resting.  You have shortness of breath that stops you from: ? Being able to talk. ? Doing normal activities.  Your chest hurts for longer than 5 minutes.  Your skin color is more blue than usual.  Your pulse oximeter shows that you have low oxygen for longer than 5 minutes.  You have a fever.  You feel too tired to breathe normally. These symptoms may represent a serious problem that is an emergency. Do not wait to see if the symptoms will go away. Get medical help right away. Call your local emergency services (911 in the [place] Do not drive yourself to the hospital. Summary  Chronic obstructive pulmonary disease (COPD) is a long-term lung problem.  The way your lungs work will never return to normal. Usually the condition gets worse over time. There are things you can do to keep yourself as healthy as possible.  Take over-the-counter and prescription medicines only as told by your doctor.  If you smoke, stop. Smoking makes the problem worse. This information is not intended to replace advice given to you by your health care provider. Make sure you discuss any questions you have with your health care provider. Document Revised: 07/26/2020 Document Reviewed: 07/26/2020 Elsevier Patient Education  2021 [employer]   

## 2020-11-08 NOTE — Progress Notes (Signed)
[facility] [place], [place] [address]  Pulmonary Sleep Medicine   Office Visit Note  Patient Name: Jose Cuevas DOB: March 10, 1961 MRN [id number]  Date of Service: 11/08/2020  Complaints/HPI: COPD he states that lately he has been having some difficulty breathing. It appears to be worse during the cold air and notes that he has some wheeze. Currently is not on any inhalers. He feels chest pain on occasion. Patient states that he was in the hospital for the chest pain.Was sent to the cardiologist and is now scheduled for a stress test and echo  History of Present Illness: Jose Cuevas is a 60 y.o.male patient who is referred for evaluation of chest pain and shortness of breath. Patient has a history rheumatoid arthritis, essential hypertension and hyperlipidemia. He was seen at [facility] ED on 09/20/2020 with chest pain. Patient describes current episodes of neck pain which radiates to her shoulder and eventually is to his chest. The pain is sharp in nature. ECG revealed sinus rhythm at a rate of 87 bpm without ischemic ST-T wave changes. Chest CT was performed which revealed chronic left loculated pleural effusion. The patient has undergone previous multiple thoracentesis, followed by Dr. Devona Konig. Chest CT also revealed aortic and great vessel atherosclerosis with calcification of the left anterior descending coronary artery. The patient has chronic exertional dyspnea, due to COPD and ongoing tobacco abuse. Patient continues smokes half a pack of cigarettes per day. The patient denies palpitations or heart racing. He denies peripheral edema. The patient is not active and does not exercise regularly.  Plan   1. Continue current medications 2. Counseled patient about low-sodium diet 3. DASH diet printed instructions given to the patient 4. Counseled patient about low-cholesterol diet 5. Low-fat and cholesterol diet printed instructions given to the patient 6. Strongly advised  patient to stop smoking 7. 2D echocardiogram 8. [place] 9. Return to clinic after 2D echocardiogram and Lexiscan Myoview  Orders Placed This Encounter  Procedures  . NM myocardial perfusion SPECT multiple (stress and rest)  . ECG stress test only  . Echo complete       ROS  General: (-) fever, (-) chills, (-) night sweats, (-) weakness Skin: (-) rashes, (-) itching,. Eyes: (-) visual changes, (-) redness, (-) itching. Nose and Sinuses: (-) nasal stuffiness or itchiness, (-) postnasal drip, (-) nosebleeds, (-) sinus trouble. Mouth and Throat: (-) sore throat, (-) hoarseness. Neck: (-) swollen glands, (-) enlarged thyroid, (-) neck pain. Respiratory: - cough, (-) bloody sputum, + shortness of breath, - wheezing. Cardiovascular: - ankle swelling, (-) chest pain. Lymphatic: (-) lymph node enlargement. Neurologic: (-) numbness, (-) tingling. Psychiatric: (-) anxiety, (-) depression   Current Medication: Outpatient Encounter Medications as of 11/08/2020  Medication Sig Note  . Aspirin-Caffeine (BC FAST PAIN RELIEF ARTHRITIS PO) Take by mouth.   . clonazePAM (KLONOPIN) 1 MG tablet Take 1 mg by mouth at bedtime as needed.   . DULoxetine (CYMBALTA) 30 MG capsule Take 1 capsule (30 mg total) by mouth daily. For depression   . folic acid (FOLVITE) 1 MG tablet Take 1 mg by mouth daily.   Marland Kitchen HUMIRA PEN 40 MG/0.4ML PNKT SMARTSIG:40 Milligram(s) SUB-Q Every 2 Weeks   . losartan-hydrochlorothiazide (HYZAAR) 100-25 MG tablet Take 1 tablet by mouth daily.   . methotrexate 50 MG/2ML injection Inject 50 mg/m2 into the vein once. 0.38mls once every 2 weeks 06/19/2019: Wednesday   . metoCLOPramide (REGLAN) 10 MG tablet Take by mouth.   Marland Kitchen  mirtazapine (REMERON) 15 MG tablet Take 0.5 tablets (7.5 mg total) by mouth at bedtime.   . Oxycodone HCl 10 MG TABS Take by mouth.   . pantoprazole (PROTONIX) 40 MG tablet Take 40 mg by mouth 2 (two) times daily.   . tamsulosin (FLOMAX) 0.4 MG CAPS capsule  TAKE 1 CAPSULE BY MOUTH EVERY DAY   . traMADol (ULTRAM) 50 MG tablet Take 1 tablet (50 mg total) by mouth every 6 (six) hours as needed.   . vitamin B-12 (CYANOCOBALAMIN) 1000 MCG tablet Take 1,000 mcg by mouth daily.    No facility-administered encounter medications on file as of 11/08/2020.    Surgical History: Past Surgical History:  Procedure Laterality Date  . BACK SURGERY    . COLONOSCOPY WITH PROPOFOL N/A 11/11/2015   Procedure: COLONOSCOPY WITH PROPOFOL;  Surgeon: [name], MD;  Location: [facility] ENDOSCOPY;  Service: Endoscopy;  Laterality: N/A;  . COLONOSCOPY WITH PROPOFOL N/A 12/02/2015   Procedure: COLONOSCOPY WITH PROPOFOL;  Surgeon: [name], MD;  Location: [facility] ENDOSCOPY;  Service: Endoscopy;  Laterality: N/A;  . COLONOSCOPY WITH PROPOFOL N/A 09/16/2019   Procedure: COLONOSCOPY WITH PROPOFOL;  Surgeon: Cuevas, Jose Pike, MD;  Location: [facility] ENDOSCOPY;  Service: Gastroenterology;  Laterality: N/A;  . ESOPHAGOGASTRODUODENOSCOPY (EGD) WITH PROPOFOL N/A 11/11/2015   Procedure: ESOPHAGOGASTRODUODENOSCOPY (EGD) WITH PROPOFOL;  Surgeon: [name], MD;  Location: [facility] ENDOSCOPY;  Service: Endoscopy;  Laterality: N/A;  . ESOPHAGOGASTRODUODENOSCOPY (EGD) WITH PROPOFOL N/A 12/02/2015   Procedure: ESOPHAGOGASTRODUODENOSCOPY (EGD) WITH PROPOFOL;  Surgeon: [name], MD;  Location: [facility] ENDOSCOPY;  Service: Endoscopy;  Laterality: N/A;  . ESOPHAGOGASTRODUODENOSCOPY (EGD) WITH PROPOFOL N/A 01/20/2016   Procedure: ESOPHAGOGASTRODUODENOSCOPY (EGD) WITH PROPOFOL;  Surgeon: [name], MD;  Location: [facility] ENDOSCOPY;  Service: Endoscopy;  Laterality: N/A;  . ESOPHAGOGASTRODUODENOSCOPY (EGD) WITH PROPOFOL N/A 09/16/2019   Procedure: ESOPHAGOGASTRODUODENOSCOPY (EGD) WITH PROPOFOL;  Surgeon: Cuevas, Jose Pike, MD;  Location: [facility] ENDOSCOPY;  Service: Gastroenterology;  Laterality: N/A;  . KNEE RECONSTRUCTION Left     Medical History: Past Medical History:   Diagnosis Date  . Back pain   . Collagen vascular disease (HCC)    Rhematoid Arthritis hx.  . Constipation, chronic   . DDD (degenerative disc disease), cervical   . History of duodenal ulcer   . History of kidney stones   . Hyperlipidemia   . Hypertension   . Low kidney function    LT Kidney non-functioning  . Nondiabetic gastroparesis   . Peripheral blood vessel disorder ([place])   . Rheumatoid arthritis with rheumatoid factor (HCC)   . Sleep apnea   . Weight loss     Family History: Family History  Problem Relation Age of Onset  . Diabetes Mother   . Diabetes Father   . Heart attack Maternal Grandmother   . COPD Brother        08/2020  . Tourette syndrome Son        in prison 2019  . Learning disabilities Son     Social History: Social History   Socioeconomic History  . Marital status: Married    Spouse name: Not on file  . Number of children: Not on file  . Years of education: Not on file  . Highest education level: Not on file  Occupational History  . Not on file  Tobacco Use  . Smoking status: Current Some Day Smoker    Years: 2.00    Types: Cigars  . Smokeless tobacco: Never Used  . Tobacco  comment: Occasional  3 a day  Vaping Use  . Vaping Use: Never used  Substance and Sexual Activity  . Alcohol use: No  . Drug use: No  . Sexual activity: Yes    Birth control/protection: None  Other Topics Concern  . Not on file  Social History Narrative  . Not on file   Social Determinants of Health   Financial Resource Strain: Not on file  Food Insecurity: Not on file  Transportation Needs: Not on file  Physical Activity: Not on file  Stress: Not on file  Social Connections: Not on file  Intimate Partner Violence: Not on file    Vital Signs: Blood pressure (!) 166/96, pulse 91, temperature 98.2 F (36.8 C), resp. rate 16, height 5\' 8"  (1.727 m), weight 132 lb 6.4 oz (60.1 kg), SpO2 98 %.  Examination: General Appearance: The patient is  well-developed, well-nourished, and in no distress. Skin: Gross inspection of skin unremarkable. Head: normocephalic, no gross deformities. Eyes: no gross deformities noted. ENT: ears appear grossly normal no exudates. Neck: Supple. No thyromegaly. No LAD. Respiratory: no rhonchi noted. Cardiovascular: Normal S1 and S2 without murmur or rub. Extremities: No cyanosis. pulses are equal. Neurologic: Alert and oriented. No involuntary movements.  LABS: Recent Results (from the past 2160 hour(s))  Basic metabolic panel     Status: Abnormal   Collection Time: 09/19/20  8:51 PM  Result Value Ref Range   Sodium 140 135 - 145 mmol/L   Potassium 3.4 (L) 3.5 - 5.1 mmol/L   Chloride 112 (H) 98 - 111 mmol/L   CO2 17 (L) 22 - 32 mmol/L   Glucose, Bld 143 (H) 70 - 99 mg/dL    Comment: Glucose reference range applies only to samples taken after fasting for at least 8 hours.   BUN 17 6 - 20 mg/dL   Creatinine, Ser 0.99 0.61 - 1.24 mg/dL   Calcium 8.8 (L) 8.9 - 10.3 mg/dL   GFR, Estimated >60 >60 mL/min    Comment: (NOTE) Calculated using the CKD-EPI Creatinine Equation (2021)    Anion gap 11 5 - 15    Comment: Performed at [facility], [address] [place], [place] [address]  CBC     Status: Abnormal   Collection Time: 09/19/20  8:51 PM  Result Value Ref Range   WBC 12.0 (H) 4.0 - 10.5 K/uL   RBC 4.27 4.22 - 5.81 MIL/uL   Hemoglobin 13.2 13.0 - 17.0 g/dL   HCT 39.2 39.0 - 52.0 %   MCV 91.8 80.0 - 100.0 fL   MCH 30.9 26.0 - 34.0 pg   MCHC 33.7 30.0 - 36.0 g/dL   RDW 14.8 11.5 - 15.5 %   Platelets 311 150 - 400 K/uL   nRBC 0.0 0.0 - 0.2 %    Comment: Performed at [facility], [address] [place], [place] [address]  Troponin I (High Sensitivity)     Status: None   Collection Time: 09/19/20  8:51 PM  Result Value Ref Range   Troponin I (High Sensitivity) 8 <18 ng/L    Comment: (NOTE) Elevated high sensitivity troponin I (hsTnI) values and significant   changes across serial measurements may suggest ACS but many other  chronic and acute conditions are known to elevate hsTnI results.  Refer to the "Links" section for chest pain algorithms and additional  guidance. Performed at [facility], [address], [place], [place] [address]   Troponin I (High Sensitivity)  Status: None   Collection Time: 09/19/20 11:26 PM  Result Value Ref Range   Troponin I (High Sensitivity) 8 <18 ng/L    Comment: (NOTE) Elevated high sensitivity troponin I (hsTnI) values and significant  changes across serial measurements may suggest ACS but many other  chronic and acute conditions are known to elevate hsTnI results.  Refer to the "Links" section for chest pain algorithms and additional  guidance. Performed at [facility], [address] [place], [place] [address]     Radiology: CT Angio Chest/Abd/Pel for Dissection W and/or Wo Contrast  Result Date: 09/20/2020 CLINICAL DATA:  On and off chest pressure for 3-4 days EXAM: CT ANGIOGRAPHY CHEST, ABDOMEN AND PELVIS TECHNIQUE: Non-contrast CT of the chest was initially obtained. Multidetector CT imaging through the chest, abdomen and pelvis was performed using the standard protocol during bolus administration of intravenous contrast. Multiplanar reconstructed images and MIPs were obtained and reviewed to evaluate the vascular anatomy. CONTRAST:  29mL OMNIPAQUE IOHEXOL 350 MG/ML SOLN COMPARISON:  Abdominal CT 06/08/2020.  Chest CT 05/10/2020 FINDINGS: CTA CHEST FINDINGS Cardiovascular: Accounting for motion artifact the noncontrast phase shows no acute intramural hematoma. No aortic dissection or aneurysm. There is aortic and great vessel atherosclerosis. Three vessel arch branching with common origins of the brachiocephalic and left common carotid. The left vertebral artery arises from the arch. Normal heart size. Trace pericardial fluid which was not seen on prior but may be physiologic.  Coronary calcifications seen at the LAD at least. Mediastinum/Nodes: Prominent hilar lymph nodes measuring up to 9 mm short axis on the left, stable and likely reactive. Lungs/Pleura: Chronic and loculated appearing small left pleural effusion with adjacent mild pulmonary scarring or atelectasis. There are scattered areas of subpleural reticulation and honeycombing. Stable solid right upper lobe pulmonary nodule measuring 5 mm in average diameter. Centrilobular emphysema. There is no edema, consolidation, effusion, or pneumothorax. Musculoskeletal: No acute or aggressive finding. Review of the MIP images confirms the above findings. CTA ABDOMEN AND PELVIS FINDINGS VASCULAR Aorta: Atheromatous wall thickening. Chronic soft tissue rind around the infrarenal aorta continuing along the proximal iliacs. No progression of the soft tissue. No flow limiting stenosis or aneurysm Celiac: Moderate narrowing at the origin with quantification limited by early branching. No acute finding or branch occlusion SMA: Chronic moderate (55%) proximal atheromatous narrowing. No branch occlusion or dissection Renals: Small left renal artery which is likely hypertrophied. Atherosclerosis of the right renal artery. IMA: Patent despite soft tissue cuffing Inflow: Diffuse atheromatous calcification of the iliacs with asymmetric irregularity at the left common iliac bifurcation, stable. The soft tissue cuffing encompasses the iliac arteries, as before. Left common iliac artery stenosis measures at least 60% Veins: Unremarkable in the arterial phase. Review of the MIP images confirms the above findings. NON-VASCULAR Hepatobiliary: No focal liver abnormality.No evidence of biliary obstruction or stone. Pancreas: Unremarkable. Spleen: Unremarkable. Adrenals/Urinary Tract: Negative adrenals. Left duplicated urinary collecting system. Left renal atrophy, especially severe the upper pole moiety. Chronic calculus without hydronephrosis at the lower  pole moiety UPJ, measuring up to 11 mm in length. Unremarkable right kidney other than small simple appearing cysts. Unremarkable bladder. Stomach/Bowel:  No obstruction. No visible bowel inflammation Lymphatic: No mass or adenopathy. Reproductive:Unremarkable Other: No ascites or pneumoperitoneum. Musculoskeletal: No acute abnormalities. Lumbar spine degeneration. Osteopenic appearance for age. Review of the MIP images confirms the above findings. IMPRESSION: 1. No acute finding including evidence of acute aortic syndrome. 2. Atherosclerosis with chronic branch narrowings described above. Coronary atherosclerosis.  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 4. Severe left renal atrophy. 5. Chronic, small and loculated left pleural effusion. 6. Emphysema. 7. 5 mm right upper lobe pulmonary nodule. Non-contrast chest CT can be considered in 12 months if patient is high-risk. This recommendation follows the consensus statement: Guidelines for Management of Incidental Pulmonary Nodules Detected on CT Images: From the Fleischner Society 2017; Radiology 2017; 284:228-243. Aortic Atherosclerosis (ICD10-I70.0) and Emphysema (ICD10-J43.9). Electronically Signed   By: Monte Fantasia M.D.   On: 09/20/2020 04:35    No results found.  No results found.    Assessment and Plan: Patient Active Problem List   Diagnosis Date Noted  . Pulmonary nodules 05/28/2020  . Atrophic kidney, acquired 05/28/2020  . Renal calculus, left 05/28/2020  . Aortic atherosclerosis ([place]) 05/28/2020  . Poison oak dermatitis 05/28/2020  . Encounter for general adult medical examination with abnormal findings 04/22/2018  . Tick bite of abdominal wall 04/22/2018  . Candida rash of groin 04/22/2018  . PAD (peripheral artery disease) ([place]) 01/30/2018  . Back pain 04/21/2014  . Rheumatoid arthritis with rheumatoid factor ([place]) 04/21/2014    1. COPD (chronic obstructive pulmonary disease) with acute bronchitis ([place])   Patient  recently admitted to the hospital with the exacerbation of COPD now seems to be doing little bit better.  I did recommend doing follow-up on pulmonary functions this will be ordered.  2. SOB (shortness of breath)   Patient does have baseline shortness breath combination of COPD also noted to have pleural effusions which may be contributing.  We have been following with serial x-rays and these have been stable to improved on the last film - Pulmonary function test; Future  3. Chronic asthma, mild intermittent, uncomplicated  nebulizers on would be recommended inhalers as prescribed  4. Chest pain, unspecified type  likely related to underlying chronic pleurisy from pleural effusion  5. Pleural effusion in other conditions classified elsewhere  most recent chest film from December showed small effusions will continue to monitor periodically with x-rays  General Counseling: I have discussed the findings of the evaluation and examination with Nicole Kindred.  I have also discussed any further diagnostic evaluation thatmay be needed or ordered today. [place] verbalizes understanding of the findings of todays visit. We also reviewed his medications today and discussed drug interactions and side effects including but not limited excessive drowsiness and altered mental states. We also discussed that there is always a risk not just to him but also people around him. he has been encouraged to call the office with any questions or concerns that should arise related to todays visit.  No orders of the defined types were placed in this encounter.    Time spent: 66  I have personally obtained a history, examined the patient, evaluated laboratory and imaging results, formulated the assessment and plan and placed orders.    Allyne Gee, MD [facility] Pulmonary and Critical Care Sleep medicine

## 2020-11-20 ENCOUNTER — Other Ambulatory Visit: Payer: Self-pay | Admitting: Internal Medicine

## 2020-11-20 DIAGNOSIS — F329 Major depressive disorder, single episode, unspecified: Secondary | ICD-10-CM

## 2020-11-21 NOTE — Telephone Encounter (Signed)
Pt needs change in therapy, will discuss

## 2020-11-21 NOTE — Telephone Encounter (Signed)
Please see me about his refils, he needed med change

## 2020-11-23 ENCOUNTER — Other Ambulatory Visit: Payer: Self-pay | Admitting: Internal Medicine

## 2020-11-25 ENCOUNTER — Encounter: Payer: Self-pay | Admitting: Hospice and Palliative Medicine

## 2020-11-25 ENCOUNTER — Ambulatory Visit (INDEPENDENT_AMBULATORY_CARE_PROVIDER_SITE_OTHER): Payer: Medicare Other | Admitting: Hospice and Palliative Medicine

## 2020-11-25 ENCOUNTER — Other Ambulatory Visit: Payer: Self-pay

## 2020-11-25 VITALS — BP 128/76 | HR 82 | Temp 97.1°F | Resp 16 | Ht 68.0 in | Wt 131.4 lb

## 2020-11-25 DIAGNOSIS — R0989 Other specified symptoms and signs involving the circulatory and respiratory systems: Secondary | ICD-10-CM | POA: Diagnosis not present

## 2020-11-25 DIAGNOSIS — G894 Chronic pain syndrome: Secondary | ICD-10-CM

## 2020-11-25 DIAGNOSIS — F17219 Nicotine dependence, cigarettes, with unspecified nicotine-induced disorders: Secondary | ICD-10-CM | POA: Diagnosis not present

## 2020-11-25 DIAGNOSIS — R918 Other nonspecific abnormal finding of lung field: Secondary | ICD-10-CM

## 2020-11-25 MED ORDER — NICOTINE 14 MG/24HR TD PT24: 14.0000 mg | MEDICATED_PATCH | Freq: Every day | TRANSDERMAL | 0 refills | Status: DC

## 2020-11-25 NOTE — Progress Notes (Signed)
[facility] [place],  [address]  Internal MEDICINE  Office Visit Note  Patient Name: Jose Cuevas  [id number]  [id number]  Date of Service: 11/27/2020  Chief Complaint  Patient presents with  . Follow-up  . Hyperlipidemia  . Hypertension  . Neck Pain    Knot on left side of neck    HPI Patient is here for routine follow-up Extensive PMH, followed by multiple providers -Chronic pain, followed by pain management, on chronic narcotics, possibly restarting Methadone at next follow-up with pain management -Urology for chronic renal stones and hematuria -Cardiology for PAD and angina--scheduled for echocardiogram and lexiscan -Rheumatology for RA with low disease activity, continued on methotrexate -Pulmonary for COPD as well as pulmonary nodules--scheduled for repeat CT scanning  Weight loss--has started Ensure protein drinks daily, noted 2 pound loss since last visit Colonoscopy up to date, last in 2020 Weight loss has been a chronic issue  Interested in smoking cessation, has tried gum in the past without success, smokes mostly due to chronic pain which provides him temporary relief  Started on Cymbalta at last visit due to depressive symptoms, tolerating well, has not noticed any changes in mental status, unaware that he was depressed Sleeping well  Current Medication: Outpatient Encounter Medications as of 11/25/2020  Medication Sig Note  . Aspirin-Caffeine (BC FAST PAIN RELIEF ARTHRITIS PO) Take by mouth.   . clonazePAM (KLONOPIN) 1 MG tablet Take 1 mg by mouth at bedtime as needed.   . DULoxetine (CYMBALTA) 30 MG capsule TAKE 1 CAPSULE (30 MG TOTAL) BY MOUTH DAILY. FOR DEPRESSION   . folic acid (FOLVITE) 1 MG tablet Take 1 mg by mouth daily.   Marland Kitchen HUMIRA PEN 40 MG/0.4ML PNKT SMARTSIG:40 Milligram(s) SUB-Q Every 2 Weeks   . losartan-hydrochlorothiazide (HYZAAR) 100-25 MG tablet Take 1 tablet by mouth daily.   . methotrexate 50 MG/2ML  injection Inject 50 mg/m2 into the vein once. 0.57mls once every 2 weeks 06/19/2019: Wednesday   . metoCLOPramide (REGLAN) 10 MG tablet Take by mouth.   . mirtazapine (REMERON) 15 MG tablet TAKE 1/2 TABLETS BY MOUTH AT BEDTIME.   . nicotine (NICODERM CQ - DOSED IN MG/24 HOURS) 14 mg/24hr patch Place 1 patch (14 mg total) onto the skin daily.   . Oxycodone HCl 10 MG TABS Take by mouth.   . pantoprazole (PROTONIX) 40 MG tablet Take 40 mg by mouth 2 (two) times daily.   . tamsulosin (FLOMAX) 0.4 MG CAPS capsule TAKE 1 CAPSULE BY MOUTH EVERY DAY   . traMADol (ULTRAM) 50 MG tablet Take 1 tablet (50 mg total) by mouth every 6 (six) hours as needed.   . vitamin B-12 (CYANOCOBALAMIN) 1000 MCG tablet Take 1,000 mcg by mouth daily.    No facility-administered encounter medications on file as of 11/25/2020.    Surgical History: Past Surgical History:  Procedure Laterality Date  . BACK SURGERY    . COLONOSCOPY WITH PROPOFOL N/A 11/11/2015   Procedure: COLONOSCOPY WITH PROPOFOL;  Surgeon: [name], MD;  Location: [facility] ENDOSCOPY;  Service: Endoscopy;  Laterality: N/A;  . COLONOSCOPY WITH PROPOFOL N/A 12/02/2015   Procedure: COLONOSCOPY WITH PROPOFOL;  Surgeon: [name], MD;  Location: [facility] ENDOSCOPY;  Service: Endoscopy;  Laterality: N/A;  . COLONOSCOPY WITH PROPOFOL N/A 09/16/2019   Procedure: COLONOSCOPY WITH PROPOFOL;  Surgeon: Toledo, Benay Pike, MD;  Location: [facility] ENDOSCOPY;  Service: Gastroenterology;  Laterality: N/A;  . ESOPHAGOGASTRODUODENOSCOPY (EGD) WITH PROPOFOL N/A 11/11/2015   Procedure: ESOPHAGOGASTRODUODENOSCOPY (  EGD) WITH PROPOFOL;  Surgeon: [name], MD;  Location: [facility] ENDOSCOPY;  Service: Endoscopy;  Laterality: N/A;  . ESOPHAGOGASTRODUODENOSCOPY (EGD) WITH PROPOFOL N/A 12/02/2015   Procedure: ESOPHAGOGASTRODUODENOSCOPY (EGD) WITH PROPOFOL;  Surgeon: [name], MD;  Location: [facility] ENDOSCOPY;  Service: Endoscopy;  Laterality: N/A;  .  ESOPHAGOGASTRODUODENOSCOPY (EGD) WITH PROPOFOL N/A 01/20/2016   Procedure: ESOPHAGOGASTRODUODENOSCOPY (EGD) WITH PROPOFOL;  Surgeon: [name], MD;  Location: [facility] ENDOSCOPY;  Service: Endoscopy;  Laterality: N/A;  . ESOPHAGOGASTRODUODENOSCOPY (EGD) WITH PROPOFOL N/A 09/16/2019   Procedure: ESOPHAGOGASTRODUODENOSCOPY (EGD) WITH PROPOFOL;  Surgeon: Toledo, Benay Pike, MD;  Location: [facility] ENDOSCOPY;  Service: Gastroenterology;  Laterality: N/A;  . KNEE RECONSTRUCTION Left     Medical History: Past Medical History:  Diagnosis Date  . Back pain   . Collagen vascular disease (HCC)    Rhematoid Arthritis hx.  . Constipation, chronic   . DDD (degenerative disc disease), cervical   . History of duodenal ulcer   . History of kidney stones   . Hyperlipidemia   . Hypertension   . Low kidney function    LT Kidney non-functioning  . Nondiabetic gastroparesis   . Peripheral blood vessel disorder ([place])   . Rheumatoid arthritis with rheumatoid factor (HCC)   . Sleep apnea   . Weight loss     Family History: Family History  Problem Relation Age of Onset  . Diabetes Mother   . Diabetes Father   . Heart attack Maternal Grandmother   . COPD Brother        08/2020  . Tourette syndrome Son        in prison 2019  . Learning disabilities Son     Social History   Socioeconomic History  . Marital status: Married    Spouse name: Not on file  . Number of children: Not on file  . Years of education: Not on file  . Highest education level: Not on file  Occupational History  . Not on file  Tobacco Use  . Smoking status: Current Some Day Smoker    Years: 2.00    Types: Cigars  . Smokeless tobacco: Never Used  . Tobacco comment: Occasional  3 a day  Vaping Use  . Vaping Use: Never used  Substance and Sexual Activity  . Alcohol use: No  . Drug use: No  . Sexual activity: Yes    Birth control/protection: None  Other Topics Concern  . Not on file  Social History Narrative  .  Not on file   Social Determinants of Health   Financial Resource Strain: Not on file  Food Insecurity: Not on file  Transportation Needs: Not on file  Physical Activity: Not on file  Stress: Not on file  Social Connections: Not on file  Intimate Partner Violence: Not on file      Review of Systems  Constitutional: Negative for chills, fatigue and unexpected weight change.  HENT: Negative for congestion, postnasal drip, rhinorrhea, sneezing and sore throat.   Eyes: Negative for redness.  Respiratory: Negative for cough, chest tightness and shortness of breath.   Cardiovascular: Negative for chest pain and palpitations.  Gastrointestinal: Negative for abdominal pain, constipation, diarrhea, nausea and vomiting.  Genitourinary: Negative for dysuria and frequency.  Musculoskeletal: Positive for arthralgias, back pain, joint swelling, myalgias, neck pain and neck stiffness.  Skin: Negative for rash.  Neurological: Negative for tremors and numbness.  Hematological: Negative for adenopathy. Does not bruise/bleed easily.  Psychiatric/Behavioral: Negative for behavioral problems (Depression), sleep  disturbance and suicidal ideas. The patient is not nervous/anxious.     Vital Signs: BP 128/76   Pulse 82   Temp (!) 97.1 F (36.2 C)   Resp 16   Ht 5\' 8"  (1.727 m)   Wt 131 lb 6.4 oz (59.6 kg)   SpO2 100%   BMI 19.98 kg/m    Physical Exam Vitals reviewed.  Constitutional:      Appearance: He is normal weight. He is ill-appearing.  Cardiovascular:     Rate and Rhythm: Normal rate and regular rhythm.     Pulses: Normal pulses.          Carotid pulses are on the right side with bruit and on the left side with bruit.    Heart sounds: Normal heart sounds.  Pulmonary:     Effort: Pulmonary effort is normal.     Breath sounds: Normal breath sounds.  Abdominal:     General: Abdomen is flat.     Palpations: Abdomen is soft.  Musculoskeletal:        General: Normal range of motion.      Cervical back: Normal range of motion.  Skin:    General: Skin is warm.  Neurological:     General: No focal deficit present.     Mental Status: He is alert and oriented to person, place, and time. Mental status is at baseline.  Psychiatric:        Mood and Affect: Mood normal.        Behavior: Behavior normal.        Thought Content: Thought content normal.        Judgment: Judgment normal.    Assessment/Plan: 1. Bilateral carotid bruits Will review carotid US and adjust plan of care as indicated based on results - US Carotid Duplex Bilateral; Future  2. Chronic pain syndrome Followed by pain management as well as rheumatology  3. Cigarette nicotine dependence with nicotine-induced disorder Start lower dose nicotine patches Advised not to smoke with patch in place Smoking cessation counseling: 1. Pt acknowledges the risks of long term smoking, she will try to quite smoking. 2. Options for different medications including nicotine products, chewing gum, patch etc, Wellbutrin and Chantix is discussed 3. Goal and date of compete cessation is discussed 4. Total time spent in smoking cessation is 15 min. - nicotine (NICODERM CQ - DOSED IN MG/24 HOURS) 14 mg/24hr patch; Place 1 patch (14 mg total) onto the skin daily.  Dispense: 28 patch; Refill: 0  4. Pulmonary nodules Followed by pulmonology  General Counseling: Jose Cuevas verbalizes understanding of the findings of todays visit and agrees with plan of treatment. I have discussed any further diagnostic evaluation that may be needed or ordered today. We also reviewed his medications today. he has been encouraged to call the office with any questions or concerns that should arise related to todays visit.    Orders Placed This Encounter  Procedures  . US Carotid Duplex Bilateral    Meds ordered this encounter  Medications  . nicotine (NICODERM CQ - DOSED IN MG/24 HOURS) 14 mg/24hr patch    Sig: Place 1 patch (14 mg total) onto  the skin daily.    Dispense:  28 patch    Refill:  0    Time spent: 30 Minutes Time spent includes review of chart, medications, test results and follow-up plan with the patient.  This patient was seen by Theodoro Grist AGNP-C in Collaboration with Dr Lavera Guise as a part of  collaborative care agreement     Tanna Furry. Bond Grieshop AGNP-C Internal medicine

## 2020-11-27 ENCOUNTER — Encounter: Payer: Self-pay | Admitting: Hospice and Palliative Medicine

## 2020-12-12 ENCOUNTER — Telehealth: Payer: Self-pay

## 2020-12-12 NOTE — Telephone Encounter (Signed)
Lmom to confirm and screen for 12-14-20 ov.

## 2020-12-13 ENCOUNTER — Telehealth: Payer: Self-pay

## 2020-12-13 NOTE — Telephone Encounter (Signed)
Lmom to reschedule 12-14-20 ultrasound. Jose Cuevas

## 2020-12-14 ENCOUNTER — Other Ambulatory Visit: Payer: Medicare Other

## 2020-12-21 ENCOUNTER — Ambulatory Visit (INDEPENDENT_AMBULATORY_CARE_PROVIDER_SITE_OTHER): Payer: Medicare Other | Admitting: Internal Medicine

## 2020-12-21 ENCOUNTER — Other Ambulatory Visit: Payer: Self-pay

## 2020-12-21 DIAGNOSIS — R0602 Shortness of breath: Secondary | ICD-10-CM

## 2020-12-21 LAB — PULMONARY FUNCTION TEST

## 2021-01-08 ENCOUNTER — Other Ambulatory Visit: Payer: Self-pay | Admitting: Internal Medicine

## 2021-01-08 DIAGNOSIS — N200 Calculus of kidney: Secondary | ICD-10-CM

## 2021-01-10 ENCOUNTER — Ambulatory Visit: Payer: Medicare Other | Admitting: Hospice and Palliative Medicine

## 2021-01-11 ENCOUNTER — Other Ambulatory Visit: Payer: Self-pay | Admitting: Hospice and Palliative Medicine

## 2021-01-11 ENCOUNTER — Other Ambulatory Visit: Payer: Self-pay

## 2021-01-11 ENCOUNTER — Ambulatory Visit: Payer: Medicare Other

## 2021-01-11 DIAGNOSIS — R0989 Other specified symptoms and signs involving the circulatory and respiratory systems: Secondary | ICD-10-CM

## 2021-01-11 DIAGNOSIS — F17219 Nicotine dependence, cigarettes, with unspecified nicotine-induced disorders: Secondary | ICD-10-CM

## 2021-01-18 NOTE — Procedures (Signed)
[place], [place] [address]  DATE OF SERVICE: January 11, 2021  CAROTID DOPPLER INTERPRETATION:  Bilateral Carotid Ultrsasound and Color Doppler Examination was performed. The RIGHT CCA shows mild plaque in the vessel. The LEFT CCA shows moderate to severe plaque in the vessel. There was no significant intimal thickening noted in the RIGHT carotid artery. There was mild intimal thickening in the LEFT carotid artery.  The RIGHT CCA shows peak systolic velocity of 951 cm per second. The end diastolic velocity is 18 cm per second on the RIGHT side. The RIGHT ICA shows peak systolic velocity of 884 per second. RIGHT sided ICA end diastolic velocity is 28 cm per second. The RIGHT ECA shows a peak systolic velocity of 166 cm per second. The ICA/CCA ratio is calculated to be 1.19. This suggests less than 50% stenosis. The Vertebral Artery shows antegrade flow.  The LEFT CCA shows peak systolic velocity of 86 cm per second. The end diastolic velocity is 19 cm per second on the LEFT side. The LEFT ICA shows peak systolic velocity of 063 per second. LEFT sided ICA end diastolic velocity is 57 cm per second. The LEFT ECA shows a peak systolic velocity of 016 cm per second. The ICA/CCA ratio is calculated to be 2.81. This suggests greater than 70% stenosis. The Vertebral Artery shows antegrade flow.   Impression:    The RIGHT CAROTID shows less than 50% stenosis. 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  There is moderate to severe plaque formation noted on the LEFT and mild plaque on the RIGHT  side. Consider a repeat Carotid doppler if clinical situation and symptoms warrant in 6-12 months. Patient should be encouraged to change lifestyles such as smoking cessation, regular exercise and dietary modification. Use of statins in the right clinical setting and ASA is encouraged.  Allyne Gee, MD [facility] Pulmonary Critical Care Medicine

## 2021-01-19 ENCOUNTER — Other Ambulatory Visit: Payer: Self-pay | Admitting: Hospice and Palliative Medicine

## 2021-01-19 ENCOUNTER — Telehealth: Payer: Self-pay

## 2021-01-19 DIAGNOSIS — I6523 Occlusion and stenosis of bilateral carotid arteries: Secondary | ICD-10-CM

## 2021-01-19 DIAGNOSIS — R0989 Other specified symptoms and signs involving the circulatory and respiratory systems: Secondary | ICD-10-CM

## 2021-01-19 NOTE — Progress Notes (Signed)
Please call and let patient know that I have reviewed his carotid US--based on results we need further imaging. I have ordered a CT angiogram. Thanks!

## 2021-01-19 NOTE — Telephone Encounter (Signed)
Lmom advising patient of CT scheduled at the [facility] 02-03-21 at 130p. [place] Sousa

## 2021-01-24 NOTE — Procedures (Signed)
[facility] [address] [place], [address]    Complete Pulmonary Function Testing Interpretation:  FINDINGS:  Forced vital capacity is normal FEV1 is normal F1 FVC ratio is mildly decreased postbronchodilator no significant change in FEV1.  Total lung capacity is normal residual decreased residual until infatuation with decreased FRC was increased DLCO was mildly decreased.  IMPRESSION:  This pulmonary function study is within normal limits patient had reduction in DLCO which is likely related to underlying history of smoking  Allyne Gee, MD [facility] Pulmonary Critical Care Medicine Sleep Medicine

## 2021-02-03 ENCOUNTER — Ambulatory Visit
Admission: RE | Admit: 2021-02-03 | Discharge: 2021-02-03 | Disposition: A | Discharge status: Home / Self Care | Payer: Medicare Other | Source: Ambulatory Visit | Attending: Hospice and Palliative Medicine | Admitting: Hospice and Palliative Medicine

## 2021-02-03 ENCOUNTER — Other Ambulatory Visit: Payer: Self-pay

## 2021-02-03 DIAGNOSIS — I6523 Occlusion and stenosis of bilateral carotid arteries: Secondary | ICD-10-CM | POA: Diagnosis present

## 2021-02-03 DIAGNOSIS — R0989 Other specified symptoms and signs involving the circulatory and respiratory systems: Secondary | ICD-10-CM | POA: Diagnosis not present

## 2021-02-03 MED ORDER — IOHEXOL 350 MG/ML SOLN
75.0000 mL | Freq: Once | INTRAVENOUS | Status: AC | PRN
  Administered 2021-02-03: 75 mL via INTRAVENOUS

## 2021-02-06 ENCOUNTER — Other Ambulatory Visit: Payer: Self-pay | Admitting: Hospice and Palliative Medicine

## 2021-02-06 DIAGNOSIS — F17219 Nicotine dependence, cigarettes, with unspecified nicotine-induced disorders: Secondary | ICD-10-CM

## 2021-02-06 NOTE — Progress Notes (Signed)
Abnormal CT neck, needs vascular eval

## 2021-02-07 NOTE — Telephone Encounter (Signed)
Please review and fill if appropriate

## 2021-02-08 ENCOUNTER — Other Ambulatory Visit: Payer: Self-pay | Admitting: Hospice and Palliative Medicine

## 2021-02-08 DIAGNOSIS — N200 Calculus of kidney: Secondary | ICD-10-CM

## 2021-02-12 ENCOUNTER — Other Ambulatory Visit: Payer: Self-pay | Admitting: Nurse Practitioner

## 2021-03-08 ENCOUNTER — Other Ambulatory Visit: Payer: Self-pay

## 2021-03-08 ENCOUNTER — Ambulatory Visit (INDEPENDENT_AMBULATORY_CARE_PROVIDER_SITE_OTHER): Payer: Medicare Other | Admitting: Nurse Practitioner

## 2021-03-08 ENCOUNTER — Encounter: Payer: Self-pay | Admitting: Nurse Practitioner

## 2021-03-08 VITALS — BP 153/78 | HR 69 | Temp 98.6°F | Resp 16 | Ht 68.0 in | Wt 132.6 lb

## 2021-03-08 DIAGNOSIS — N261 Atrophy of kidney (terminal): Secondary | ICD-10-CM

## 2021-03-08 DIAGNOSIS — I10 Essential (primary) hypertension: Secondary | ICD-10-CM

## 2021-03-08 DIAGNOSIS — F17200 Nicotine dependence, unspecified, uncomplicated: Secondary | ICD-10-CM | POA: Diagnosis not present

## 2021-03-08 DIAGNOSIS — R5382 Chronic fatigue, unspecified: Secondary | ICD-10-CM

## 2021-03-08 DIAGNOSIS — E559 Vitamin D deficiency, unspecified: Secondary | ICD-10-CM

## 2021-03-08 DIAGNOSIS — R5381 Other malaise: Secondary | ICD-10-CM

## 2021-03-08 DIAGNOSIS — F329 Major depressive disorder, single episode, unspecified: Secondary | ICD-10-CM

## 2021-03-08 DIAGNOSIS — E538 Deficiency of other specified B group vitamins: Secondary | ICD-10-CM

## 2021-03-08 DIAGNOSIS — E7841 Elevated Lipoprotein(a): Secondary | ICD-10-CM

## 2021-03-08 MED ORDER — LOSARTAN POTASSIUM-HCTZ 100-25 MG PO TABS: 1.0000 | ORAL_TABLET | Freq: Every day | ORAL | 0 refills | Status: DC

## 2021-03-08 MED ORDER — BUPROPION HCL ER (XL) 150 MG PO TB24: 150.0000 mg | ORAL_TABLET | Freq: Every day | ORAL | 0 refills | Status: DC

## 2021-03-08 NOTE — Progress Notes (Signed)
[facility] [place], [place] [address]  Internal MEDICINE  Office Visit Note  Patient Name: Jose Cuevas  [id number]  [id number]  Date of Service: 03/12/2021  Chief Complaint  Patient presents with   Follow-up    Ultrasound results, BP check   Hypertension   Sleep Apnea   Arthritis    HPI Jose Cuevas presents for a follow-up visit to discuss his carotid ultrasound results.  His results show less than 50% stenosis of the right carotid artery and more than 70% stenosis of the left carotid artery.  Plaque formation in the left carotid artery is moderate to severe.  And there is mild plaque formation on the right side.  A CTA of the neck was also performed that showed 60% stenosis of the left internal carotid artery at the bifurcation and moderate tortuosity of the cervical internal carotid arteries bilaterally without significant stenosis.  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  -After discussing the imaging results with the patient, diet and lifestyle modifications were discussed.  The patient is a current everyday smoker and is ready to work on smoking cessation.  Diet modifications including low-fat and low-cholesterol food choices as well as over-the-counter supplementation with fish oil capsule was discussed -Patient reports that he feels like he does not have energy, he feels fatigued most every day.     Current Medication: Outpatient Encounter Medications as of 03/08/2021  Medication Sig Note   Aspirin-Caffeine (BC FAST PAIN RELIEF ARTHRITIS PO) Take by mouth.    buPROPion (WELLBUTRIN XL) 150 MG 24 hr tablet Take 1 tablet (150 mg total) by mouth daily.    clonazePAM (KLONOPIN) 1 MG tablet Take 1 mg by mouth at bedtime as needed.    folic acid (FOLVITE) 1 MG tablet Take 1 mg by mouth daily.    HUMIRA PEN 40  MG/0.4ML PNKT SMARTSIG:40 Milligram(s) SUB-Q Every 2 Weeks    methotrexate 50 MG/2ML injection Inject 50 mg/m2 into the vein once. 0.46ms once every 2 weeks 06/19/2019: Wednesday    metoCLOPramide (REGLAN) 10 MG tablet Take by mouth.    mirtazapine (REMERON) 15 MG tablet TAKE 1/2 TABLETS BY MOUTH AT BEDTIME.    nicotine (NICODERM CQ - DOSED IN MG/24 HOURS) 14 mg/24hr patch APPLY 1 PATCH ONTO THE SKIN EVERY DAY    Oxycodone HCl 10 MG TABS Take by mouth.    pantoprazole (PROTONIX) 40 MG tablet Take 40 mg by mouth 2 (two) times daily.    tamsulosin (FLOMAX) 0.4 MG CAPS capsule TAKE 1 CAPSULE BY MOUTH EVERY DAY    traMADol (ULTRAM) 50 MG tablet Take 1 tablet (50 mg total) by mouth every 6 (six) hours as needed.    vitamin B-12 (CYANOCOBALAMIN) 1000 MCG tablet Take 1,000 mcg by mouth daily.    [DISCONTINUED] DULoxetine (CYMBALTA) 30 MG capsule TAKE 1 CAPSULE (30 MG TOTAL) BY MOUTH DAILY. FOR DEPRESSION    [DISCONTINUED] losartan-hydrochlorothiazide (HYZAAR) 100-25 MG tablet Take 1 tablet by mouth daily.    losartan-hydrochlorothiazide (HYZAAR) 100-25 MG tablet Take 1 tablet by mouth daily.    No facility-administered encounter medications on file as of 03/08/2021.    Surgical History: Past Surgical History:  Procedure Laterality Date   BACK SURGERY     COLONOSCOPY WITH PROPOFOL N/A 11/11/2015   Procedure: COLONOSCOPY WITH PROPOFOL;  Surgeon: MJosefine Class MD;  Location:  [facility] ENDOSCOPY;  Service: Endoscopy;  Laterality: N/A;   COLONOSCOPY WITH PROPOFOL N/A 12/02/2015   Procedure: COLONOSCOPY WITH PROPOFOL;  Surgeon: [name], MD;  Location: [facility] ENDOSCOPY;  Service: Endoscopy;  Laterality: N/A;   COLONOSCOPY WITH PROPOFOL N/A 09/16/2019   Procedure: COLONOSCOPY WITH PROPOFOL;  Surgeon: Toledo, Benay Pike, MD;  Location: [facility] ENDOSCOPY;  Service: Gastroenterology;  Laterality: N/A;   ESOPHAGOGASTRODUODENOSCOPY (EGD) WITH PROPOFOL N/A 11/11/2015   Procedure: ESOPHAGOGASTRODUODENOSCOPY  (EGD) WITH PROPOFOL;  Surgeon: [name], MD;  Location: [facility] ENDOSCOPY;  Service: Endoscopy;  Laterality: N/A;   ESOPHAGOGASTRODUODENOSCOPY (EGD) WITH PROPOFOL N/A 12/02/2015   Procedure: ESOPHAGOGASTRODUODENOSCOPY (EGD) WITH PROPOFOL;  Surgeon: [name], MD;  Location: [facility] ENDOSCOPY;  Service: Endoscopy;  Laterality: N/A;   ESOPHAGOGASTRODUODENOSCOPY (EGD) WITH PROPOFOL N/A 01/20/2016   Procedure: ESOPHAGOGASTRODUODENOSCOPY (EGD) WITH PROPOFOL;  Surgeon: [name], MD;  Location: [facility] ENDOSCOPY;  Service: Endoscopy;  Laterality: N/A;   ESOPHAGOGASTRODUODENOSCOPY (EGD) WITH PROPOFOL N/A 09/16/2019   Procedure: ESOPHAGOGASTRODUODENOSCOPY (EGD) WITH PROPOFOL;  Surgeon: Toledo, Benay Pike, MD;  Location: [facility] ENDOSCOPY;  Service: Gastroenterology;  Laterality: N/A;   KNEE RECONSTRUCTION Left     Medical History: Past Medical History:  Diagnosis Date   Back pain    Collagen vascular disease ([place])    Rhematoid Arthritis hx.   Constipation, chronic    DDD (degenerative disc disease), cervical    History of duodenal ulcer    History of kidney stones    Hyperlipidemia    Hypertension    Low kidney function    LT Kidney non-functioning   Nondiabetic gastroparesis    Peripheral blood vessel disorder (HCC)    Rheumatoid arthritis with rheumatoid factor (HCC)    Sleep apnea    Weight loss     Family History: Family History  Problem Relation Age of Onset   Diabetes Mother    Diabetes Father    Heart attack Maternal Grandmother    COPD Brother        08/2020   Tourette syndrome Son        in prison 2019   Learning disabilities Son     Social History   Socioeconomic History   Marital status: Married    Spouse name: Not on file   Number of children: Not on file   Years of education: Not on file   Highest education level: Not on file  Occupational History   Not on file  Tobacco Use   Smoking status: Some Days    Pack years: 0.00    Types: Cigars    Smokeless tobacco: Never   Tobacco comments:    Occasional  3 a day  Vaping Use   Vaping Use: Never used  Substance and Sexual Activity   Alcohol use: No   Drug use: No   Sexual activity: Yes    Birth control/protection: None  Other Topics Concern   Not on file  Social History Narrative   Not on file   Social Determinants of Health   Financial Resource Strain: Not on file  Food Insecurity: Not on file  Transportation Needs: Not on file  Physical Activity: Not on file  Stress: Not on file  Social Connections: Not on file  Intimate Partner Violence: Not on file      Review of Systems  Constitutional:  Negative for chills, fatigue and unexpected weight change.  HENT:  Negative for congestion, rhinorrhea, sneezing and sore throat.   Eyes:  Negative for redness.  Respiratory:  Negative for cough, chest tightness and shortness of breath.   Cardiovascular:  Negative for chest pain and palpitations.  Gastrointestinal:  Negative for abdominal pain, constipation, diarrhea, nausea and vomiting.  Genitourinary:  Negative for dysuria and frequency.  Musculoskeletal:  Negative for arthralgias, back pain, joint swelling and neck pain.  Skin:  Negative for rash.  Neurological: Negative.  Negative for tremors and numbness.  Hematological:  Negative for adenopathy. Does not bruise/bleed easily.  Psychiatric/Behavioral:  Negative for behavioral problems (Depression), sleep disturbance and suicidal ideas. The patient is not nervous/anxious.    Vital Signs: BP (!) 153/78   Pulse 69   Temp 98.6 F (37 C)   Resp 16   Ht 5' 8"  (1.727 m)   Wt 132 lb 9.6 oz (60.1 kg)   SpO2 95%   BMI 20.16 kg/m    Physical Exam Vitals reviewed.  Constitutional:      General: He is not in acute distress.    Appearance: He is well-developed. He is not diaphoretic.  HENT:     Head: Normocephalic and atraumatic.  Neck:     Thyroid: No thyromegaly.     Vascular: No JVD.     Trachea: No tracheal  deviation.  Cardiovascular:     Rate and Rhythm: Normal rate and regular rhythm.     Pulses: Normal pulses.     Heart sounds: Normal heart sounds. No murmur heard.   No friction rub. No gallop.  Pulmonary:     Effort: Pulmonary effort is normal. No respiratory distress.     Breath sounds: Normal breath sounds. No wheezing or rales.  Chest:     Chest wall: No tenderness.  Musculoskeletal:     Cervical back: Normal range of motion and neck supple.  Lymphadenopathy:     Cervical: No cervical adenopathy.  Skin:    General: Skin is warm and dry.     Capillary Refill: Capillary refill takes less than 2 seconds.  Neurological:     Mental Status: He is alert and oriented to person, place, and time.  Psychiatric:        Mood and Affect: Mood normal.        Behavior: Behavior normal.     Assessment/Plan: 1. Chronic fatigue and malaise The patient reports chronic fatigue and malaise with no known etiology at this time.  Labs ordered to - CBC with Differential/Platelet - Iron, TIBC and Ferritin Panel - CMP14+EGFR - TSH + free T4 - Sed Rate (ESR)  2. Essential hypertension Patient has a history of hypertension and is currently taking losartan hydrochlorothiazide 100-25 mg tablet once daily.  Refill ordered. - losartan-hydrochlorothiazide (HYZAAR) 100-25 MG tablet; Take 1 tablet by mouth daily.  Dispense: 90 tablet; Refill: 0  3. Tobacco dependence with current use Patient is ready to stop smoking cigarettes and discussed smoking cessation today.  He is willing to try medication to help him quit.  After discussing different medications, he was interested in trying bupropion which was prescribed during this visit. Smoking cessation instruction/counseling given:  counseled patient on the dangers of tobacco use, advised patient to stop smoking, and reviewed strategies to maximize success - buPROPion (WELLBUTRIN XL) 150 MG 24 hr tablet; Take 1 tablet (150 mg total) by mouth daily.  Dispense:  30 tablet; Refill: 0  4. Elevated lipoprotein(a) His previous lipid profile was only abnormal for elevated LDL all other values were normal.  Lipid profile ordered to be done at [facility]. - Lipid Profile  5. B12 deficiency  History of vitamin B12 deficiency, CBC and B12 folate panel ordered to evaluate current status. - CBC with Differential/Platelet - B12 and Folate Panel  6. Vitamin D deficiency History of vitamin D deficiency we will check serum vitamin D level. - Vitamin D (25 hydroxy)  7. Atrophic kidney, acquired Patient has a history of an acquired atrophic kidney, caution in prescribing new medications has been taken.  8. Major depression, melancholic type History of major depression, controlled with current medications.   General Counseling: Navy verbalizes understanding of the findings of todays visit and agrees with plan of treatment. I have discussed any further diagnostic evaluation that may be needed or ordered today. We also reviewed his medications today. he has been encouraged to call the office with any questions or concerns that should arise related to todays visit.    Orders Placed This Encounter  Procedures   Lipid Profile   CBC with Differential/Platelet   Iron, TIBC and Ferritin Panel   CMP14+EGFR   TSH + free T4   Vitamin D (25 hydroxy)   B12 and Folate Panel   Sed Rate (ESR)    Meds ordered this encounter  Medications   buPROPion (WELLBUTRIN XL) 150 MG 24 hr tablet    Sig: Take 1 tablet (150 mg total) by mouth daily.    Dispense:  30 tablet    Refill:  0   losartan-hydrochlorothiazide (HYZAAR) 100-25 MG tablet    Sig: Take 1 tablet by mouth daily.    Dispense:  90 tablet    Refill:  0    Return in about 4 weeks (around 04/05/2021) for Review labs/test, BP check, med refill, Kailand Seda PCP.   Total time spent:30 Minutes Time spent includes review of chart, medications, test results, and follow up plan with the patient.   [place] Controlled Substance  Database was reviewed by me.  This patient was seen by Jonetta Osgood, FNP-C in collaboration with Dr. Clayborn Bigness as a part of collaborative care agreement.   Khai Torbert R. Valetta Fuller, MSN, FNP-C Internal medicine

## 2021-03-12 DIAGNOSIS — R5381 Other malaise: Secondary | ICD-10-CM | POA: Insufficient documentation

## 2021-03-12 DIAGNOSIS — E538 Deficiency of other specified B group vitamins: Secondary | ICD-10-CM | POA: Insufficient documentation

## 2021-03-12 DIAGNOSIS — F17200 Nicotine dependence, unspecified, uncomplicated: Secondary | ICD-10-CM | POA: Insufficient documentation

## 2021-03-12 DIAGNOSIS — E559 Vitamin D deficiency, unspecified: Secondary | ICD-10-CM | POA: Insufficient documentation

## 2021-03-12 DIAGNOSIS — I10 Essential (primary) hypertension: Secondary | ICD-10-CM | POA: Insufficient documentation

## 2021-03-31 ENCOUNTER — Other Ambulatory Visit: Payer: Self-pay | Admitting: Nurse Practitioner

## 2021-03-31 DIAGNOSIS — F17200 Nicotine dependence, unspecified, uncomplicated: Secondary | ICD-10-CM

## 2021-04-24 ENCOUNTER — Encounter: Payer: Self-pay | Admitting: Nurse Practitioner

## 2021-04-24 ENCOUNTER — Ambulatory Visit (INDEPENDENT_AMBULATORY_CARE_PROVIDER_SITE_OTHER): Payer: Medicare Other | Admitting: Nurse Practitioner

## 2021-04-24 ENCOUNTER — Other Ambulatory Visit: Payer: Self-pay

## 2021-04-24 VITALS — BP 138/85 | HR 96 | Temp 97.6°F | Resp 16 | Ht 68.0 in | Wt 133.8 lb

## 2021-04-24 DIAGNOSIS — I700 Atherosclerosis of aorta: Secondary | ICD-10-CM | POA: Diagnosis not present

## 2021-04-24 DIAGNOSIS — I10 Essential (primary) hypertension: Secondary | ICD-10-CM

## 2021-04-24 DIAGNOSIS — F329 Major depressive disorder, single episode, unspecified: Secondary | ICD-10-CM | POA: Diagnosis not present

## 2021-04-24 DIAGNOSIS — F17200 Nicotine dependence, unspecified, uncomplicated: Secondary | ICD-10-CM

## 2021-04-24 DIAGNOSIS — N200 Calculus of kidney: Secondary | ICD-10-CM

## 2021-04-24 MED ORDER — DULOXETINE HCL 30 MG PO CPEP
30.0000 mg | ORAL_CAPSULE | Freq: Every day | ORAL | 1 refills | Status: DC
Start: 2021-04-24 — End: 2021-09-01

## 2021-04-24 MED ORDER — TAMSULOSIN HCL 0.4 MG PO CAPS: 0.4000 mg | ORAL_CAPSULE | Freq: Every day | ORAL | 0 refills | Status: DC

## 2021-04-24 NOTE — Progress Notes (Signed)
[facility] [place], [place] [address]  Internal MEDICINE  Office Visit Note  Patient Name: Jose Cuevas  [id number]  [id number]  Date of Service: 04/24/2021  Chief Complaint  Patient presents with   Follow-up    Review labs, BP check, med refill    HPI Jose Cuevas presents for follow up visit for blood pressure check, medication review and refill. He was supposed to have labs done prior to today's office visit but he has not had the labs drawn yet. He is requesting refills of duloextine and tamsulosin. He tried wellbutrin to decrease nicotine cravings but he was having adverse side effects including having his blood pressure drop too low while he was taking the wellbutrin. He stopped taking it and his blood pressure normalized. He states he will get his labs drawn today.    Current Medication: Outpatient Encounter Medications as of 04/24/2021  Medication Sig Note   Aspirin-Caffeine (BC FAST PAIN RELIEF ARTHRITIS PO) Take by mouth.    clonazePAM (KLONOPIN) 1 MG tablet Take 1 mg by mouth at bedtime as needed.    folic acid (FOLVITE) 1 MG tablet Take 1 mg by mouth daily.    HUMIRA PEN 40 MG/0.4ML PNKT SMARTSIG:40 Milligram(s) SUB-Q Every 2 Weeks    losartan-hydrochlorothiazide (HYZAAR) 100-25 MG tablet Take 1 tablet by mouth daily. 04/24/2021: Patient takes PRN   methotrexate 50 MG/2ML injection Inject 50 mg/m2 into the vein once. 0.53ms once every 2 weeks 06/19/2019: Wednesday    metoCLOPramide (REGLAN) 10 MG tablet Take by mouth.    mirtazapine (REMERON) 15 MG tablet TAKE 1/2 TABLETS BY MOUTH AT BEDTIME.    nicotine (NICODERM CQ - DOSED IN MG/24 HOURS) 14 mg/24hr patch APPLY 1 PATCH ONTO THE SKIN EVERY DAY    Oxycodone HCl 10 MG TABS Take by mouth.    pantoprazole (PROTONIX) 40 MG tablet Take 40 mg by mouth 2 (two) times daily.    traMADol (ULTRAM) 50 MG tablet Take 1 tablet (50 mg total) by mouth every 6 (six) hours as needed.    vitamin B-12 (CYANOCOBALAMIN) 1000  MCG tablet Take 1,000 mcg by mouth daily.    [DISCONTINUED] DULoxetine (CYMBALTA) 30 MG capsule Take 1 capsule by mouth at bedtime.    [DISCONTINUED] tamsulosin (FLOMAX) 0.4 MG CAPS capsule TAKE 1 CAPSULE BY MOUTH EVERY DAY    DULoxetine (CYMBALTA) 30 MG capsule Take 1 capsule (30 mg total) by mouth at bedtime.    tamsulosin (FLOMAX) 0.4 MG CAPS capsule Take 1 capsule (0.4 mg total) by mouth daily.    [DISCONTINUED] buPROPion (WELLBUTRIN XL) 150 MG 24 hr tablet Take 1 tablet (150 mg total) by mouth daily. (Patient not taking: Reported on 04/24/2021)    No facility-administered encounter medications on file as of 04/24/2021.    Surgical History: Past Surgical History:  Procedure Laterality Date   BACK SURGERY     COLONOSCOPY WITH PROPOFOL N/A 11/11/2015   Procedure: COLONOSCOPY WITH PROPOFOL;  Surgeon: MJosefine Class MD;  [facility];  Service: Endoscopy;  Laterality: N/A;   COLONOSCOPY WITH PROPOFOL N/A 12/02/2015   Procedure: COLONOSCOPY WITH PROPOFOL;  Surgeon: MJosefine Class MD;  [facility];  Service: Endoscopy;  Laterality: N/A;   COLONOSCOPY WITH PROPOFOL N/A 09/16/2019   Procedure: COLONOSCOPY WITH PROPOFOL;  Surgeon: Toledo, TBenay Pike MD;  Location: [facility] ENDOSCOPY;  Service: Gastroenterology;  Laterality: N/A;   ESOPHAGOGASTRODUODENOSCOPY (EGD) WITH PROPOFOL N/A 11/11/2015   Procedure: ESOPHAGOGASTRODUODENOSCOPY (EGD) WITH PROPOFOL;  Surgeon: MRodman Key  Jose Ricks, MD;  Location[facility]COPY;  Service: Endoscopy;  Laterality: N/A;   ESOPHAGOGASTRODUODENOSCOPY (EGD) WITH PROPOFOL N/A 12/02/2015   Procedure: ESOPHAGOGASTRODUODENOSCOPY (EGD) WITH PROPOFOL;  Surgeon: Jose Class, MD;  Location: [facility] ENDOSCOPY;  Service: Endoscopy;  Laterality: N/A;   ESOPHAGOGASTRODUODENOSCOPY (EGD) WITH PROPOFOL N/A 01/20/2016   Procedure: ESOPHAGOGASTRODUODENOSCOPY (EGD) WITH PROPOFOL;  Surgeon: Jose Class, MD;  Location: [facility] ENDOSCOPY;  Service:  Endoscopy;  Laterality: N/A;   ESOPHAGOGASTRODUODENOSCOPY (EGD) WITH PROPOFOL N/A 09/16/2019   Procedure: ESOPHAGOGASTRODUODENOSCOPY (EGD) WITH PROPOFOL;  Surgeon: Toledo, Benay Pike, MD;  Location: [facility] ENDOSCOPY;  Service: Gastroenterology;  Laterality: N/A;   KNEE RECONSTRUCTION Left     Medical History: Past Medical History:  Diagnosis Date   Back pain    Collagen vascular disease ([place])    Rhematoid Arthritis hx.   Constipation, chronic    DDD (degenerative disc disease), cervical    History of duodenal ulcer    History of kidney stones    Hyperlipidemia    Hypertension    Low kidney function    LT Kidney non-functioning   Nondiabetic gastroparesis    Peripheral blood vessel disorder (HCC)    Rheumatoid arthritis with rheumatoid factor (HCC)    Sleep apnea    Weight loss     Family History: Family History  Problem Relation Age of Onset   Diabetes Mother    Diabetes Father    Heart attack Maternal Grandmother    COPD Brother        08/2020   Tourette syndrome Son        in prison 2019   Learning disabilities Son     Social History   Socioeconomic History   Marital status: Married    Spouse name: Not on file   Number of children: Not on file   Years of education: Not on file   Highest education level: Not on file  Occupational History   Not on file  Tobacco Use   Smoking status: Some Days    Types: Cigars   Smokeless tobacco: Never   Tobacco comments:    Occasional  3 a day  Vaping Use   Vaping Use: Never used  Substance and Sexual Activity   Alcohol use: No   Drug use: No   Sexual activity: Yes    Birth control/protection: None  Other Topics Concern   Not on file  Social History Narrative   Not on file   Social Determinants of Health   Financial Resource Strain: Not on file  Food Insecurity: Not on file  Transportation Needs: Not on file  Physical Activity: Not on file  Stress: Not on file  Social Connections: Not on file  Intimate Partner  Violence: Not on file      Review of Systems  Constitutional:  Negative for chills, fatigue and unexpected weight change.  HENT:  Negative for congestion, rhinorrhea, sneezing and sore throat.   Eyes:  Negative for redness.  Respiratory:  Negative for cough, chest tightness and shortness of breath.   Cardiovascular:  Negative for chest pain and palpitations.  Gastrointestinal:  Negative for abdominal pain, constipation, diarrhea, nausea and vomiting.  Genitourinary:  Negative for dysuria and frequency.  Musculoskeletal:  Negative for arthralgias, back pain, joint swelling and neck pain.  Skin:  Negative for rash.  Neurological: Negative.  Negative for tremors and numbness.  Hematological:  Negative for adenopathy. Does not bruise/bleed easily.  Psychiatric/Behavioral:  Negative for behavioral problems (Depression), sleep disturbance and suicidal  ideas. The patient is not nervous/anxious.    Vital Signs: BP 138/85 Comment: 142/86  Pulse 96   Temp 97.6 F (36.4 C)   Resp 16   Ht '5\' 8"'$  (1.727 m)   Wt 133 lb 12.8 oz (60.7 kg)   SpO2 98%   BMI 20.34 kg/m    Physical Exam Vitals reviewed.  Constitutional:      General: He is not in acute distress.    Appearance: Normal appearance. He is normal weight. He is not ill-appearing.  HENT:     Head: Normocephalic and atraumatic.  Cardiovascular:     Rate and Rhythm: Normal rate and regular rhythm.     Pulses: Normal pulses.  Pulmonary:     Effort: Pulmonary effort is normal. No respiratory distress.  Skin:    General: Skin is warm and dry.     Capillary Refill: Capillary refill takes less than 2 seconds.  Neurological:     Mental Status: He is alert and oriented to person, place, and time.  Psychiatric:        Mood and Affect: Mood normal.        Behavior: Behavior normal.    Assessment/Plan: 1. Essential hypertension Stable with current medications, no changes, no refills needed at this time, will follow up in 3 weeks at  his annual physical exam.  2. Tobacco dependence with current use Stopped wellbutrin, declined trying anything else at this time, he states he will ask if he desired further help with smoking cessation.   3. Aortic atherosclerosis (HCC) Carotid ultrasound done in April 2022, CTA neck done in May 2022. No significant stenosis of the cervical internal carotid arteries, 60% stenosis of left ICA at the bifurcation. 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 Will continue to monitor with imaging studies every 6 months to 1 year.   4. Major depression, melancholic type Stable at this time, duloxetine refilled.  - DULoxetine (CYMBALTA) 30 MG capsule; Take 1 capsule (30 mg total) by mouth at bedtime.  Dispense: 90 capsule; Refill: 1  5. Renal calculus, left Stable, tamsulosin refilled.  - tamsulosin (FLOMAX) 0.4 MG CAPS capsule; Take 1 capsule (0.4 mg total) by mouth daily.  Dispense: 90 capsule; Refill: 0   General Counseling: Thang verbalizes understanding of the findings of todays visit and agrees with plan of treatment. I have discussed any further diagnostic evaluation that may be needed or ordered today. We also reviewed his medications today. he has been encouraged to call the office with any questions or concerns that should arise related to todays visit.    No orders of the defined types were placed in this encounter.   Meds ordered this encounter  Medications   DULoxetine (CYMBALTA) 30 MG capsule    Sig: Take 1 capsule (30 mg total) by mouth at bedtime.    Dispense:  90 capsule    Refill:  1   tamsulosin (FLOMAX) 0.4 MG CAPS capsule    Sig: Take 1 capsule (0.4 mg total) by mouth daily.    Dispense:  90 capsule    Refill:  0    Return in about 3 weeks (around 05/15/2021) for CPE, Lyndzie Zentz PCP previously scheduled.   Total time spent:30 Minutes Time spent includes review of chart, medications, test results, and follow up plan with the  patient.   [place] Controlled Substance Database was reviewed by me.  This patient was seen by Jonetta Osgood, FNP-C in collaboration with  Dr. Clayborn Bigness as a part of collaborative care agreement.   Jarrius Huaracha R. Valetta Fuller, MSN, FNP-C Internal medicine

## 2021-04-25 LAB — CMP14+EGFR
ALT: 7 IU/L (ref 0–44)
AST: 12 IU/L (ref 0–40)
Albumin/Globulin Ratio: 1.5 (ref 1.2–2.2)
Albumin: 3.8 g/dL (ref 3.8–4.8)
Alkaline Phosphatase: 110 IU/L (ref 44–121)
BUN/Creatinine Ratio: 13 (ref 10–24)
BUN: 13 mg/dL (ref 8–27)
Bilirubin Total: <0.2 mg/dL (ref 0.0–1.2)
CO2: 21 mmol/L (ref 20–29)
Calcium: 9.5 mg/dL (ref 8.6–10.2)
Chloride: 111 mmol/L — ABNORMAL HIGH (ref 96–106)
Creatinine, Ser: 1.04 mg/dL (ref 0.76–1.27)
Globulin, Total: 2.6 g/dL (ref 1.5–4.5)
Glucose: 90 mg/dL (ref 65–99)
Potassium: 4.8 mmol/L (ref 3.5–5.2)
Sodium: 145 mmol/L — ABNORMAL HIGH (ref 134–144)
Total Protein: 6.4 g/dL (ref 6.0–8.5)
eGFR: 82 mL/min/{1.73_m2} (ref >59)

## 2021-04-25 LAB — LIPID PANEL
Chol/HDL Ratio: 5.2 ratio — ABNORMAL HIGH (ref 0.0–5.0)
Cholesterol, Total: 192 mg/dL (ref 100–199)
HDL: 37 mg/dL — ABNORMAL LOW (ref >39)
LDL Chol Calc (NIH): 126 mg/dL — ABNORMAL HIGH (ref 0–99)
Triglycerides: 161 mg/dL — ABNORMAL HIGH (ref 0–149)
VLDL Cholesterol Cal: 29 mg/dL (ref 5–40)

## 2021-04-25 LAB — CBC WITH DIFFERENTIAL/PLATELET
Basophils Absolute: 0.1 10*3/uL (ref 0.0–0.2)
Basos: 2 %
EOS (ABSOLUTE): 0.2 10*3/uL (ref 0.0–0.4)
Eos: 3 %
Hematocrit: 43.5 % (ref 37.5–51.0)
Hemoglobin: 14.8 g/dL (ref 13.0–17.7)
Immature Grans (Abs): 0 10*3/uL (ref 0.0–0.1)
Immature Granulocytes: 1 %
Lymphocytes Absolute: 2.4 10*3/uL (ref 0.7–3.1)
Lymphs: 33 %
MCH: 31.7 pg (ref 26.6–33.0)
MCHC: 34 g/dL (ref 31.5–35.7)
MCV: 93 fL (ref 79–97)
Monocytes Absolute: 0.7 10*3/uL (ref 0.1–0.9)
Monocytes: 9 %
Neutrophils Absolute: 4 10*3/uL (ref 1.4–7.0)
Neutrophils: 52 %
Platelets: 292 10*3/uL (ref 150–450)
RBC: 4.67 x10E6/uL (ref 4.14–5.80)
RDW: 13.6 % (ref 11.6–15.4)
WBC: 7.4 10*3/uL (ref 3.4–10.8)

## 2021-04-25 LAB — SEDIMENTATION RATE: Sed Rate: 10 mm/hr (ref 0–30)

## 2021-04-25 LAB — TSH+FREE T4
Free T4: 0.84 ng/dL (ref 0.82–1.77)
TSH: 0.676 u[IU]/mL (ref 0.450–4.500)

## 2021-04-25 LAB — VITAMIN D 25 HYDROXY (VIT D DEFICIENCY, FRACTURES): Vit D, 25-Hydroxy: 27.6 ng/mL — ABNORMAL LOW (ref 30.0–100.0)

## 2021-04-25 LAB — IRON,TIBC AND FERRITIN PANEL
Ferritin: 22 ng/mL — ABNORMAL LOW (ref 30–400)
Iron Saturation: 16 % (ref 15–55)
Iron: 41 ug/dL (ref 38–169)
Total Iron Binding Capacity: 261 ug/dL (ref 250–450)
UIBC: 220 ug/dL (ref 111–343)

## 2021-04-25 LAB — B12 AND FOLATE PANEL
Folate: >20 ng/mL (ref >3.0)
Vitamin B-12: >2000 pg/mL — ABNORMAL HIGH (ref 232–1245)

## 2021-04-26 NOTE — Progress Notes (Signed)
I have reviewed the lab results and will discuss with the patient at his next office visit scheduled for 05/15/21. Abnormal results and recommended interventions will be discussed in full at [facility] 05/15/21

## 2021-05-15 ENCOUNTER — Ambulatory Visit (INDEPENDENT_AMBULATORY_CARE_PROVIDER_SITE_OTHER): Payer: Medicare Other | Admitting: Nurse Practitioner

## 2021-05-15 ENCOUNTER — Encounter: Payer: Self-pay | Admitting: Nurse Practitioner

## 2021-05-15 ENCOUNTER — Encounter (INDEPENDENT_AMBULATORY_CARE_PROVIDER_SITE_OTHER): Payer: Self-pay

## 2021-05-15 ENCOUNTER — Other Ambulatory Visit: Payer: Self-pay

## 2021-05-15 VITALS — BP 138/82 | HR 75 | Temp 97.4°F | Resp 16 | Ht 68.0 in | Wt 135.8 lb

## 2021-05-15 DIAGNOSIS — E782 Mixed hyperlipidemia: Secondary | ICD-10-CM

## 2021-05-15 DIAGNOSIS — J432 Centrilobular emphysema: Secondary | ICD-10-CM | POA: Diagnosis not present

## 2021-05-15 DIAGNOSIS — F329 Major depressive disorder, single episode, unspecified: Secondary | ICD-10-CM

## 2021-05-15 DIAGNOSIS — F17200 Nicotine dependence, unspecified, uncomplicated: Secondary | ICD-10-CM

## 2021-05-15 DIAGNOSIS — M0579 Rheumatoid arthritis with rheumatoid factor of multiple sites without organ or systems involvement: Secondary | ICD-10-CM

## 2021-05-15 DIAGNOSIS — Z0001 Encounter for general adult medical examination with abnormal findings: Secondary | ICD-10-CM

## 2021-05-15 DIAGNOSIS — E559 Vitamin D deficiency, unspecified: Secondary | ICD-10-CM

## 2021-05-15 DIAGNOSIS — N200 Calculus of kidney: Secondary | ICD-10-CM

## 2021-05-15 DIAGNOSIS — N261 Atrophy of kidney (terminal): Secondary | ICD-10-CM

## 2021-05-15 DIAGNOSIS — I700 Atherosclerosis of aorta: Secondary | ICD-10-CM

## 2021-05-15 DIAGNOSIS — K219 Gastro-esophageal reflux disease without esophagitis: Secondary | ICD-10-CM

## 2021-05-15 DIAGNOSIS — I10 Essential (primary) hypertension: Secondary | ICD-10-CM | POA: Diagnosis not present

## 2021-05-15 DIAGNOSIS — E870 Hyperosmolality and hypernatremia: Secondary | ICD-10-CM

## 2021-05-15 MED ORDER — ROSUVASTATIN CALCIUM 5 MG PO TABS: 5.0000 mg | ORAL_TABLET | Freq: Every day | ORAL | 1 refills | Status: DC

## 2021-05-15 NOTE — Progress Notes (Signed)
[facility] [place], [place] [address]  Internal MEDICINE  Office Visit Note  Patient Name: Jose Cuevas  [id number]  [id number]  Date of Service: 05/15/2021  Chief Complaint  Patient presents with   Medicare Wellness    Discuss blood work   Sleep Apnea   Hyperlipidemia   Hypertension    HPI Jose Cuevas presents for an annual well visit and physical exam. he has a history of arthritis, sleep apnea, hyperlipidemia, hypertension, kidney stones, rheumatoid arthritis, left atrophic kidney, duodenal ulcer, chronic constipation, nondiabetic gastroparesis, and cervical degenerative disc disease. Declined COVID vaccine.  He is retired, used to work as a Building control surveyor. Denies any pain today. Working on smoking cessation, bupropion did not help, was an everyday 1ppd smoker. Has since cut down to 3 per day, and does not smoke every single day.  Labs discussed with Jose Cuevas:  Low vitamin D. Abnormal lipid panel Metabolic panel was normal except for elevated sodium 145 and elevated chloride 111. Potassium was normal 4,0, was previously low at 3.4 in December 2021.  History of anemia- iron studies normal except for low ferritin at 22.  B12 is high, takes a supplement.  CBC is normal, ESR is normal, TSH and free T4 are normal.     Current Medication: Outpatient Encounter Medications as of 05/15/2021  Medication Sig Note   Aspirin-Caffeine (BC FAST PAIN RELIEF ARTHRITIS PO) Take by mouth.    clonazePAM (KLONOPIN) 1 MG tablet Take 1 mg by mouth at bedtime as needed.    DULoxetine (CYMBALTA) 30 MG capsule Take 1 capsule (30 mg total) by mouth at bedtime.    folic acid (FOLVITE) 1 MG tablet Take 1 mg by mouth daily.    HUMIRA PEN 40 MG/0.4ML PNKT SMARTSIG:40 Milligram(s) SUB-Q Every 2 Weeks    losartan-hydrochlorothiazide (HYZAAR) 100-25 MG tablet Take 1 tablet by mouth daily. 04/24/2021: Patient takes PRN   methotrexate 50 MG/2ML injection Inject 50 mg/m2 into the vein once. 0.61ms once  every 2 weeks 06/19/2019: Wednesday    metoCLOPramide (REGLAN) 10 MG tablet Take by mouth.    mirtazapine (REMERON) 15 MG tablet TAKE 1/2 TABLETS BY MOUTH AT BEDTIME.    Oxycodone HCl 10 MG TABS Take by mouth.    pantoprazole (PROTONIX) 40 MG tablet Take 40 mg by mouth 2 (two) times daily.    rosuvastatin (CRESTOR) 5 MG tablet Take 1 tablet (5 mg total) by mouth daily.    tamsulosin (FLOMAX) 0.4 MG CAPS capsule Take 1 capsule (0.4 mg total) by mouth daily.    traMADol (ULTRAM) 50 MG tablet Take 1 tablet (50 mg total) by mouth every 6 (six) hours as needed.    vitamin B-12 (CYANOCOBALAMIN) 1000 MCG tablet Take 1,000 mcg by mouth daily.    [DISCONTINUED] losartan-hydrochlorothiazide (HYZAAR) 100-25 MG tablet Take 1 tablet by mouth daily.    [DISCONTINUED] methadone (DOLOPHINE) 10 MG tablet Take 10 mg by mouth 4 (four) times daily.     [DISCONTINUED] nicotine (NICODERM CQ - DOSED IN MG/24 HOURS) 14 mg/24hr patch APPLY 1 PATCH ONTO THE SKIN EVERY DAY (Patient not taking: Reported on 05/15/2021)    [DISCONTINUED] tamsulosin (FLOMAX) 0.4 MG CAPS capsule TAKE 1 CAPSULE BY MOUTH EVERY DAY    [DISCONTINUED] triamcinolone (KENALOG) 0.025 % cream Apply 1 application topically 2 (two) times daily. (Patient not taking: Reported on 06/21/2020)    No facility-administered encounter medications on file as of 05/15/2021.    Surgical History: Past Surgical History:  Procedure Laterality Date  BACK SURGERY     COLONOSCOPY WITH PROPOFOL N/A 11/11/2015   Procedure: COLONOSCOPY WITH PROPOFOL;  Surgeon: Jose Class, MD;  Location: [facility] ENDOSCOPY;  Service: Endoscopy;  Laterality: N/A;   COLONOSCOPY WITH PROPOFOL N/A 12/02/2015   Procedure: COLONOSCOPY WITH PROPOFOL;  Surgeon: Jose Class, MD;  Location: [facility] ENDOSCOPY;  Service: Endoscopy;  Laterality: N/A;   COLONOSCOPY WITH PROPOFOL N/A 09/16/2019   Procedure: COLONOSCOPY WITH PROPOFOL;  Surgeon: Jose, Benay Pike, MD;  Location: [facility] ENDOSCOPY;   Service: Gastroenterology;  Laterality: N/A;   ESOPHAGOGASTRODUODENOSCOPY (EGD) WITH PROPOFOL N/A 11/11/2015   Procedure: ESOPHAGOGASTRODUODENOSCOPY (EGD) WITH PROPOFOL;  Surgeon: Jose Class, MD;  Location: [facility] ENDOSCOPY;  Service: Endoscopy;  Laterality: N/A;   ESOPHAGOGASTRODUODENOSCOPY (EGD) WITH PROPOFOL N/A 12/02/2015   Procedure: ESOPHAGOGASTRODUODENOSCOPY (EGD) WITH PROPOFOL;  Surgeon: Jose Class, MD;  Location: [facility] ENDOSCOPY;  Service: Endoscopy;  Laterality: N/A;   ESOPHAGOGASTRODUODENOSCOPY (EGD) WITH PROPOFOL N/A 01/20/2016   Procedure: ESOPHAGOGASTRODUODENOSCOPY (EGD) WITH PROPOFOL;  Surgeon: Jose Class, MD;  Location: [facility] ENDOSCOPY;  Service: Endoscopy;  Laterality: N/A;   ESOPHAGOGASTRODUODENOSCOPY (EGD) WITH PROPOFOL N/A 09/16/2019   Procedure: ESOPHAGOGASTRODUODENOSCOPY (EGD) WITH PROPOFOL;  Surgeon: Jose, Benay Pike, MD;  Location: [facility] ENDOSCOPY;  Service: Gastroenterology;  Laterality: N/A;   KNEE RECONSTRUCTION Left     Medical History: Past Medical History:  Diagnosis Date   Back pain    Collagen vascular disease ([place])    Rhematoid Arthritis hx.   Constipation, chronic    DDD (degenerative disc disease), cervical    History of duodenal ulcer    History of kidney stones    Hyperlipidemia    Hypertension    Low kidney function    LT Kidney non-functioning   Nondiabetic gastroparesis    Peripheral blood vessel disorder (HCC)    Rheumatoid arthritis with rheumatoid factor (HCC)    Sleep apnea    Weight loss     Family History: Family History  Problem Relation Age of Onset   Diabetes Mother    Diabetes Father    Heart attack Maternal Grandmother    COPD Brother        08/2020   Tourette syndrome Son        in prison 2019   Learning disabilities Son     Social History   Socioeconomic History   Marital status: Married    Spouse name: Not on file   Number of children: Not on file   Years of education: Not on file   Highest  education level: Not on file  Occupational History   Not on file  Tobacco Use   Smoking status: Some Days    Types: Cigars   Smokeless tobacco: Never   Tobacco comments:    Occasional  3 a day  Vaping Use   Vaping Use: Never used  Substance and Sexual Activity   Alcohol use: No   Drug use: No   Sexual activity: Yes    Birth control/protection: None  Other Topics Concern   Not on file  Social History Narrative   Not on file   Social Determinants of Health   Financial Resource Strain: Not on file  Food Insecurity: Not on file  Transportation Needs: Not on file  Physical Activity: Not on file  Stress: Not on file  Social Connections: Not on file  Intimate Partner Violence: Not on file      Review of Systems  Constitutional:  Negative for activity change, appetite change, chills, fatigue, fever  and unexpected weight change.  HENT: Negative.  Negative for congestion, ear pain, rhinorrhea, sore throat and trouble swallowing.   Eyes: Negative.   Respiratory: Negative.  Negative for cough, chest tightness, shortness of breath and wheezing.   Cardiovascular: Negative.  Negative for chest pain.  Gastrointestinal: Negative.  Negative for abdominal pain, blood in stool, constipation, diarrhea, nausea and vomiting.  Endocrine: Negative.   Genitourinary: Negative.  Negative for difficulty urinating, dysuria, frequency, hematuria and urgency.  Musculoskeletal: Negative.  Negative for arthralgias, back pain, joint swelling, myalgias and neck pain.  Skin: Negative.  Negative for rash and wound.  Allergic/Immunologic: Negative.  Negative for immunocompromised state.  Neurological: Negative.  Negative for dizziness, seizures, numbness and headaches.  Hematological: Negative.   Psychiatric/Behavioral: Negative.  Negative for behavioral problems, self-injury and suicidal ideas. The patient is not nervous/anxious.    Vital Signs: BP (!) 142/86   Pulse 75   Temp (!) 97.4 F (36.3 C)    Resp 16   Ht _0  (1.727 m)   Wt 135 lb 12.8 oz (61.6 kg)   SpO2 98%   BMI 20.65 kg/m    Physical Exam Vitals reviewed.  Constitutional:      General: He is awake. He is not in acute distress.    Appearance: Normal appearance. He is well-developed, well-groomed and normal weight. He is not ill-appearing or diaphoretic.  HENT:     Head: Normocephalic and atraumatic.     Right Ear: Hearing, tympanic membrane, ear canal and external ear normal.     Left Ear: Hearing, tympanic membrane, ear canal and external ear normal.     Nose: Nose normal. No congestion or rhinorrhea.     Mouth/Throat:     Lips: Pink.     Mouth: Mucous membranes are moist.     Pharynx: Oropharynx is clear. No oropharyngeal exudate or posterior oropharyngeal erythema.  Eyes:     General: Lids are normal. Vision grossly intact. Gaze aligned appropriately. No scleral icterus.       Right eye: No discharge.        Left eye: No discharge.     Extraocular Movements: Extraocular movements intact.     Conjunctiva/sclera: Conjunctivae normal.     Pupils: Pupils are equal, round, and reactive to light.     Funduscopic exam:    Right eye: Red reflex present.        Left eye: Red reflex present. Neck:     Thyroid: No thyromegaly.     Vascular: No carotid bruit or JVD.     Trachea: Trachea and phonation normal. No tracheal deviation.  Cardiovascular:     Rate and Rhythm: Normal rate and regular rhythm.     Pulses: Normal pulses.     Heart sounds: Normal heart sounds, S1 normal and S2 normal. No murmur heard.   No friction rub. No gallop.  Pulmonary:     Effort: Pulmonary effort is normal. No accessory muscle usage or respiratory distress.     Breath sounds: Normal breath sounds and air entry. No stridor. No wheezing or rales.  Chest:     Chest wall: No tenderness.  Abdominal:     General: Abdomen is flat. Bowel sounds are normal. There is no distension.     Palpations: Abdomen is soft. There is no shifting  dullness, fluid wave, mass or pulsatile mass.     Tenderness: There is no abdominal tenderness. There is no guarding or rebound.  Musculoskeletal:  General: No tenderness or deformity. Normal range of motion.     Cervical back: Normal range of motion and neck supple.     Right lower leg: No edema.     Left lower leg: No edema.  Lymphadenopathy:     Cervical: No cervical adenopathy.  Skin:    General: Skin is warm and dry.     Capillary Refill: Capillary refill takes less than 2 seconds.     Coloration: Skin is not pale.     Findings: No erythema or rash.  Neurological:     Mental Status: He is alert.     Motor: No abnormal muscle tone.     Deep Tendon Reflexes: Reflexes are normal and symmetric.  Psychiatric:        Mood and Affect: Mood normal.        Behavior: Behavior normal. Behavior is cooperative.        Thought Content: Thought content normal.        Judgment: Judgment normal.       Assessment/Plan: 1. Encounter for general adult medical examination with abnormal findings Age-appropriate preventive screenings and vaccinations discussed, annual physical exam completed. Routine labs for health maintenance were done prior to his annual wellness exam, discussed results at today's visit. PHM updated. Discussed smoking cessation, see below.  Declines COVID vaccine. Colonoscopy was done in 2020 and was negative, repeat in 10 years.   2. Essential hypertension Blood pressure is stable with current medication.   3. Centrilobular emphysema ([place]) Long-time current smoker, working on quitting, chronic emphysema. Currently stable with no maintenance inhaler.   4. Aortic atherosclerosis ([place]) Carotid ultrasound done in April 2022, CTA neck done in May 2022. No significant stenosis of the cervical internal carotid arteries, 60% stenosis of left ICA at the bifurcation. 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 Will continue to  monitor with imaging studies every 6 months to 1 year.   5. Major depression, melancholic type Stable on duloxetine.   6. Gastroesophageal reflux disease without esophagitis Taking pantoprazole, well controlled, no refill needed at this time.   7. Rheumatoid arthritis involving multiple sites with positive rheumatoid factor ([place]) Follow by rheumatology- takes methotrexate injections, humira injections and daily folic acid 1 mg.   8. Tobacco dependence with current use Stopped wellbutrin, declined trying anything else at this time, he states he will ask if he desired further help with smoking cessation.  9. Mixed hyperlipidemia Elevated triglycerides, and LDL and low HDL. Start rosuvastatin 5 mg daily. Patient also recommended to take daily fish oil supplement of 1000 mg.   10. Renal calculus, left Takes tamsulosin. stable  11. Atrophic kidney, acquired Patient has a history of an acquired atrophic kidney, caution in prescribing new medications has been taken  12. Vitamin D deficiency Vitamin D level is 27.6. Recommended patient start OTC vitamin D3 supplement 2000-5000 units daily.   13. Hypernatremia Sodium level of 145, chloride was also high at 111. Will recheck BMP in September. Recommended patient decrease salt intake especially if he is adding additional salt to his food. Increasing water intake was also recommended, daily intake should be approx 64 oz.    General Counseling: Markez verbalizes understanding of the findings of todays visit and agrees with plan of treatment. I have discussed any further diagnostic evaluation that may be needed or ordered today. We also reviewed his medications today. he has been encouraged to call the office with any questions or  concerns that should arise related to todays visit.    No orders of the defined types were placed in this encounter.   Meds ordered this encounter  Medications   rosuvastatin (CRESTOR) 5 MG tablet    Sig: Take 1  tablet (5 mg total) by mouth daily.    Dispense:  90 tablet    Refill:  1    Return in about 3 months (around 08/15/2021) for F/U, med refill, [place] PCP.   Total time spent:30 Minutes Time spent includes review of chart, medications, test results, and follow up plan with the patient.   [place] Controlled Substance Database was reviewed by me.  This patient was seen by Jonetta Osgood, FNP-C in collaboration with Dr. Clayborn Bigness as a part of collaborative care agreement.  Telissa Palmisano R. Valetta Fuller, MSN, FNP-C Internal medicine

## 2021-08-06 ENCOUNTER — Other Ambulatory Visit: Payer: Self-pay | Admitting: Internal Medicine

## 2021-08-06 DIAGNOSIS — F329 Major depressive disorder, single episode, unspecified: Secondary | ICD-10-CM

## 2021-08-14 ENCOUNTER — Ambulatory Visit: Payer: Medicare Other | Admitting: Nurse Practitioner

## 2021-09-01 ENCOUNTER — Ambulatory Visit (INDEPENDENT_AMBULATORY_CARE_PROVIDER_SITE_OTHER): Payer: Medicare Other | Admitting: Nurse Practitioner

## 2021-09-01 ENCOUNTER — Other Ambulatory Visit: Payer: Self-pay

## 2021-09-01 ENCOUNTER — Encounter: Payer: Self-pay | Admitting: Nurse Practitioner

## 2021-09-01 VITALS — BP 137/74 | HR 66 | Temp 98.2°F | Resp 16 | Ht 67.0 in | Wt 133.0 lb

## 2021-09-01 DIAGNOSIS — M0579 Rheumatoid arthritis with rheumatoid factor of multiple sites without organ or systems involvement: Secondary | ICD-10-CM | POA: Diagnosis not present

## 2021-09-01 DIAGNOSIS — I10 Essential (primary) hypertension: Secondary | ICD-10-CM | POA: Diagnosis not present

## 2021-09-01 DIAGNOSIS — F329 Major depressive disorder, single episode, unspecified: Secondary | ICD-10-CM | POA: Diagnosis not present

## 2021-09-01 DIAGNOSIS — N200 Calculus of kidney: Secondary | ICD-10-CM | POA: Diagnosis not present

## 2021-09-01 MED ORDER — LOSARTAN POTASSIUM-HCTZ 100-25 MG PO TABS: 1.0000 | ORAL_TABLET | Freq: Every day | ORAL | 0 refills | Status: DC

## 2021-09-01 MED ORDER — MELOXICAM 15 MG PO TABS
7.5000 mg | ORAL_TABLET | Freq: Every day | ORAL | 2 refills | Status: DC
Start: 2021-09-01 — End: 2021-11-30

## 2021-09-01 MED ORDER — DULOXETINE HCL 30 MG PO CPEP: 30.0000 mg | ORAL_CAPSULE | Freq: Every day | ORAL | 1 refills | Status: DC

## 2021-09-01 MED ORDER — TAMSULOSIN HCL 0.4 MG PO CAPS
0.4000 mg | ORAL_CAPSULE | Freq: Every day | ORAL | 0 refills | Status: DC
Start: 2021-09-01 — End: 2021-11-30

## 2021-09-01 NOTE — Progress Notes (Signed)
[facility] [place], [place] [address]  Internal MEDICINE  Office Visit Note  Patient Name: Jose Cuevas  [id number]  [id number]  Date of Service: 09/01/2021  Chief Complaint  Patient presents with   Follow-up   Hyperlipidemia   Hypertension    HPI Jose Cuevas presents for a follow up visit for hypertension and hyperlipidemia. His blood pressure is well controlled with current medications. He is requesting refills of meloxicam. He has been using tramadol but wants to try meloxicam again. He has RA and sees rheumatology. He is on humira injections and methotrexate.     Current Medication: Outpatient Encounter Medications as of 09/01/2021  Medication Sig Note   Aspirin-Caffeine (BC FAST PAIN RELIEF ARTHRITIS PO) Take by mouth.    clonazePAM (KLONOPIN) 1 MG tablet Take 1 mg by mouth at bedtime as needed.    folic acid (FOLVITE) 1 MG tablet Take 1 mg by mouth daily.    HUMIRA PEN 40 MG/0.4ML PNKT SMARTSIG:40 Milligram(s) SUB-Q Every 2 Weeks    meloxicam (MOBIC) 15 MG tablet Take 0.5-1 tablets (7.5-15 mg total) by mouth daily.    methotrexate 50 MG/2ML injection Inject 50 mg/m2 into the vein once. 0.15mls once every 2 weeks 06/19/2019: Wednesday    metoCLOPramide (REGLAN) 10 MG tablet Take by mouth.    mirtazapine (REMERON) 15 MG tablet TAKE 1/2 TABLETS BY MOUTH AT BEDTIME.    Oxycodone HCl 10 MG TABS Take by mouth.    pantoprazole (PROTONIX) 40 MG tablet Take 40 mg by mouth 2 (two) times daily.    rosuvastatin (CRESTOR) 5 MG tablet Take 1 tablet (5 mg total) by mouth daily.    vitamin B-12 (CYANOCOBALAMIN) 1000 MCG tablet Take 1,000 mcg by mouth daily.    [DISCONTINUED] DULoxetine (CYMBALTA) 30 MG capsule Take 1 capsule (30 mg total) by mouth at bedtime.    [DISCONTINUED] losartan-hydrochlorothiazide (HYZAAR) 100-25 MG tablet Take 1 tablet by mouth daily. 04/24/2021: Patient takes PRN   [DISCONTINUED] tamsulosin (FLOMAX) 0.4 MG CAPS capsule Take 1 capsule (0.4 mg  total) by mouth daily.    DULoxetine (CYMBALTA) 30 MG capsule Take 1 capsule (30 mg total) by mouth at bedtime.    losartan-hydrochlorothiazide (HYZAAR) 100-25 MG tablet Take 1 tablet by mouth daily.    tamsulosin (FLOMAX) 0.4 MG CAPS capsule Take 1 capsule (0.4 mg total) by mouth daily.    [DISCONTINUED] traMADol (ULTRAM) 50 MG tablet Take 1 tablet (50 mg total) by mouth every 6 (six) hours as needed.    No facility-administered encounter medications on file as of 09/01/2021.    Surgical History: Past Surgical History:  Procedure Laterality Date   BACK SURGERY     COLONOSCOPY WITH PROPOFOL N/A 11/11/2015   Procedure: COLONOSCOPY WITH PROPOFOL;  Surgeon: [name], MD;  Location: [facility] ENDOSCOPY;  Service: Endoscopy;  Laterality: N/A;   COLONOSCOPY WITH PROPOFOL N/A 12/02/2015   Procedure: COLONOSCOPY WITH PROPOFOL;  Surgeon: [name], MD;  Location: [facility] ENDOSCOPY;  Service: Endoscopy;  Laterality: N/A;   COLONOSCOPY WITH PROPOFOL N/A 09/16/2019   Procedure: COLONOSCOPY WITH PROPOFOL;  Surgeon: [place], Benay Pike, MD;  Location: [facility] ENDOSCOPY;  Service: Gastroenterology;  Laterality: N/A;   ESOPHAGOGASTRODUODENOSCOPY (EGD) WITH PROPOFOL N/A 11/11/2015   Procedure: ESOPHAGOGASTRODUODENOSCOPY (EGD) WITH PROPOFOL;  Surgeon: [name], MD;  Location: [facility] ENDOSCOPY;  Service: Endoscopy;  Laterality: N/A;   ESOPHAGOGASTRODUODENOSCOPY (EGD) WITH PROPOFOL N/A 12/02/2015   Procedure: ESOPHAGOGASTRODUODENOSCOPY (EGD) WITH PROPOFOL;  Surgeon: [name], MD;  Location: [facility] ENDOSCOPY;  Service: Endoscopy;  Laterality: N/A;   ESOPHAGOGASTRODUODENOSCOPY (EGD) WITH PROPOFOL N/A 01/20/2016   Procedure: ESOPHAGOGASTRODUODENOSCOPY (EGD) WITH PROPOFOL;  Surgeon: [name], MD;  Location: [facility] ENDOSCOPY;  Service: Endoscopy;  Laterality: N/A;   ESOPHAGOGASTRODUODENOSCOPY (EGD) WITH PROPOFOL N/A 09/16/2019   Procedure: ESOPHAGOGASTRODUODENOSCOPY (EGD) WITH PROPOFOL;   Surgeon: [place], Benay Pike, MD;  Location: [facility] ENDOSCOPY;  Service: Gastroenterology;  Laterality: N/A;   KNEE RECONSTRUCTION Left     Medical History: Past Medical History:  Diagnosis Date   Back pain    Collagen vascular disease ([place])    Rhematoid Arthritis hx.   Constipation, chronic    DDD (degenerative disc disease), cervical    History of duodenal ulcer    History of kidney stones    Hyperlipidemia    Hypertension    Low kidney function    LT Kidney non-functioning   Nondiabetic gastroparesis    Peripheral blood vessel disorder (HCC)    Rheumatoid arthritis with rheumatoid factor (HCC)    Sleep apnea    Weight loss     Family History: Family History  Problem Relation Age of Onset   Diabetes Mother    Diabetes Father    Heart attack Maternal Grandmother    COPD Brother        08/2020   Tourette syndrome Son        in prison 2019   Learning disabilities Son     Social History   Socioeconomic History   Marital status: Married    Spouse name: Not on file   Number of children: Not on file   Years of education: Not on file   Highest education level: Not on file  Occupational History   Not on file  Tobacco Use   Smoking status: Some Days    Types: Cigars   Smokeless tobacco: Never   Tobacco comments:    Occasional  3 a day  Vaping Use   Vaping Use: Never used  Substance and Sexual Activity   Alcohol use: No   Drug use: No   Sexual activity: Yes    Birth control/protection: None  Other Topics Concern   Not on file  Social History Narrative   Not on file   Social Determinants of Health   Financial Resource Strain: Not on file  Food Insecurity: Not on file  Transportation Needs: Not on file  Physical Activity: Not on file  Stress: Not on file  Social Connections: Not on file  Intimate Partner Violence: Not on file      Review of Systems  Constitutional:  Negative for chills, fatigue and unexpected weight change.  HENT:  Negative for  congestion, rhinorrhea, sneezing and sore throat.   Eyes:  Negative for redness.  Respiratory: Negative.  Negative for cough, chest tightness, shortness of breath and wheezing.   Cardiovascular: Negative.  Negative for chest pain and palpitations.  Gastrointestinal:  Negative for abdominal pain, constipation, diarrhea, nausea and vomiting.  Genitourinary:  Negative for dysuria and frequency.  Musculoskeletal:  Positive for arthralgias and myalgias. Negative for back pain, joint swelling and neck pain.  Skin:  Negative for rash.  Neurological: Negative.  Negative for tremors and numbness.  Hematological:  Negative for adenopathy. Does not bruise/bleed easily.  Psychiatric/Behavioral:  Negative for behavioral problems (Depression), sleep disturbance and suicidal ideas. The patient is not nervous/anxious.    Vital Signs: BP 137/74   Pulse 66   Temp 98.2 F (36.8 C)   Resp 16  Ht 5\' 7"  (1.702 m)   Wt 133 lb (60.3 kg)   SpO2 99%   BMI 20.83 kg/m    Physical Exam Vitals reviewed.  Constitutional:      General: He is not in acute distress.    Appearance: Normal appearance. He is normal weight. He is not ill-appearing.  HENT:     Head: Normocephalic and atraumatic.  Eyes:     Pupils: Pupils are equal, round, and reactive to light.  Cardiovascular:     Rate and Rhythm: Normal rate and regular rhythm.  Pulmonary:     Effort: Pulmonary effort is normal. No respiratory distress.  Neurological:     Mental Status: He is alert and oriented to person, place, and time.     Cranial Nerves: No cranial nerve deficit.     Coordination: Coordination normal.     Gait: Gait normal.  Psychiatric:        Mood and Affect: Mood normal.        Behavior: Behavior normal.       Assessment/Plan: 1. Essential hypertension Stable, well controlled, refill ordered.  - losartan-hydrochlorothiazide (HYZAAR) 100-25 MG tablet; Take 1 tablet by mouth daily.  Dispense: 90 tablet; Refill: 0  2.  Rheumatoid arthritis involving multiple sites with positive rheumatoid factor (HCC) Trialing meloxicam,to help joint pain. - meloxicam (MOBIC) 15 MG tablet; Take 0.5-1 tablets (7.5-15 mg total) by mouth daily.  Dispense: 30 tablet; Refill: 2  3. Major depression, melancholic type Stable, taking duloxetine. Refills ordered - DULoxetine (CYMBALTA) 30 MG capsule; Take 1 capsule (30 mg total) by mouth at bedtime.  Dispense: 90 capsule; Refill: 1  4. Renal calculus, left Stable, refills ordered - tamsulosin (FLOMAX) 0.4 MG CAPS capsule; Take 1 capsule (0.4 mg total) by mouth daily.  Dispense: 90 capsule; Refill: 0   General Counseling: Jailin verbalizes understanding of the findings of todays visit and agrees with plan of treatment. I have discussed any further diagnostic evaluation that may be needed or ordered today. We also reviewed his medications today. he has been encouraged to call the office with any questions or concerns that should arise related to todays visit.    No orders of the defined types were placed in this encounter.   Meds ordered this encounter  Medications   DULoxetine (CYMBALTA) 30 MG capsule    Sig: Take 1 capsule (30 mg total) by mouth at bedtime.    Dispense:  90 capsule    Refill:  1   losartan-hydrochlorothiazide (HYZAAR) 100-25 MG tablet    Sig: Take 1 tablet by mouth daily.    Dispense:  90 tablet    Refill:  0   tamsulosin (FLOMAX) 0.4 MG CAPS capsule    Sig: Take 1 capsule (0.4 mg total) by mouth daily.    Dispense:  90 capsule    Refill:  0   meloxicam (MOBIC) 15 MG tablet    Sig: Take 0.5-1 tablets (7.5-15 mg total) by mouth daily.    Dispense:  30 tablet    Refill:  2    Return in about 3 months (around 11/30/2021) for F/U, med refill, Keiva Dina PCP.   Total time spent:30 Minutes Time spent includes review of chart, medications, test results, and follow up plan with the patient.   [place] Controlled Substance Database was reviewed by me.  This patient  was seen by Jonetta Osgood, FNP-C in collaboration with Dr. Clayborn Bigness as a part of collaborative care agreement.   Zandyr Barnhill R. Valetta Fuller, MSN,  FNP-C Internal medicine

## 2021-09-20 ENCOUNTER — Encounter: Payer: Self-pay | Admitting: Nurse Practitioner

## 2021-10-27 ENCOUNTER — Other Ambulatory Visit: Payer: Self-pay | Admitting: Nurse Practitioner

## 2021-10-27 DIAGNOSIS — E782 Mixed hyperlipidemia: Secondary | ICD-10-CM

## 2021-11-30 ENCOUNTER — Encounter: Payer: Self-pay | Admitting: Nurse Practitioner

## 2021-11-30 ENCOUNTER — Other Ambulatory Visit: Payer: Self-pay

## 2021-11-30 ENCOUNTER — Ambulatory Visit (INDEPENDENT_AMBULATORY_CARE_PROVIDER_SITE_OTHER): Payer: Medicare Other | Admitting: Nurse Practitioner

## 2021-11-30 VITALS — BP 140/80 | HR 75 | Temp 98.1°F | Resp 16 | Ht 67.0 in | Wt 137.4 lb

## 2021-11-30 DIAGNOSIS — J3089 Other allergic rhinitis: Secondary | ICD-10-CM | POA: Diagnosis not present

## 2021-11-30 DIAGNOSIS — F329 Major depressive disorder, single episode, unspecified: Secondary | ICD-10-CM

## 2021-11-30 DIAGNOSIS — N200 Calculus of kidney: Secondary | ICD-10-CM

## 2021-11-30 DIAGNOSIS — M0579 Rheumatoid arthritis with rheumatoid factor of multiple sites without organ or systems involvement: Secondary | ICD-10-CM | POA: Diagnosis not present

## 2021-11-30 DIAGNOSIS — I10 Essential (primary) hypertension: Secondary | ICD-10-CM | POA: Diagnosis not present

## 2021-11-30 MED ORDER — MELOXICAM 15 MG PO TABS: 7.5000 mg | ORAL_TABLET | Freq: Every day | ORAL | 2 refills | Status: DC

## 2021-11-30 MED ORDER — LEVOCETIRIZINE DIHYDROCHLORIDE 5 MG PO TABS: 5.0000 mg | ORAL_TABLET | Freq: Every evening | ORAL | 2 refills | Status: DC

## 2021-11-30 MED ORDER — TAMSULOSIN HCL 0.4 MG PO CAPS: 0.4000 mg | ORAL_CAPSULE | Freq: Every day | ORAL | 0 refills | Status: DC

## 2021-11-30 MED ORDER — LOSARTAN POTASSIUM-HCTZ 100-25 MG PO TABS: 1.0000 | ORAL_TABLET | Freq: Every day | ORAL | 0 refills | Status: DC

## 2021-11-30 MED ORDER — MIRTAZAPINE 15 MG PO TABS: 15.0000 mg | ORAL_TABLET | Freq: Every day | ORAL | 2 refills | Status: DC

## 2021-11-30 NOTE — Progress Notes (Signed)
[facility] [place], [place] [address]  Internal MEDICINE  Office Visit Note  Patient Name: Jose Cuevas  [id number]  [id number]  Date of Service: 11/30/2021  Chief Complaint  Patient presents with   Follow-up    Watery eyes since last summer   Hyperlipidemia   Hypertension   Medication Refill    HPI Jose Cuevas presents for a follow-up visit for hypertension, hyperlipidemia and medication refills.  He also reports that he has been having watery eyes since last summer in 2022.  He has tried several over-the-counter medications for allergies/watery eyes including Zyrtec, Claritin, Allegra and Benadryl. His blood pressure is stable with current medications.     Current Medication: Outpatient Encounter Medications as of 11/30/2021  Medication Sig Note   Aspirin-Caffeine (BC FAST PAIN RELIEF ARTHRITIS PO) Take by mouth.    clonazePAM (KLONOPIN) 1 MG tablet Take 1 mg by mouth at bedtime as needed.    DULoxetine (CYMBALTA) 30 MG capsule Take 1 capsule (30 mg total) by mouth at bedtime.    folic acid (FOLVITE) 1 MG tablet Take 1 mg by mouth daily.    HUMIRA PEN 40 MG/0.4ML PNKT SMARTSIG:40 Milligram(s) SUB-Q Every 2 Weeks    levocetirizine (XYZAL) 5 MG tablet Take 1 tablet (5 mg total) by mouth every evening.    methotrexate 50 MG/2ML injection Inject 50 mg/m2 into the vein once. 0.40mls once every 2 weeks 06/19/2019: Wednesday    metoCLOPramide (REGLAN) 10 MG tablet Take by mouth.    Oxycodone HCl 10 MG TABS Take by mouth.    pantoprazole (PROTONIX) 40 MG tablet Take 40 mg by mouth 2 (two) times daily.    rosuvastatin (CRESTOR) 5 MG tablet TAKE 1 TABLET (5 MG TOTAL) BY MOUTH DAILY.    vitamin B-12 (CYANOCOBALAMIN) 1000 MCG tablet Take 1,000 mcg by mouth daily.    [DISCONTINUED] losartan-hydrochlorothiazide (HYZAAR) 100-25 MG tablet Take 1 tablet by mouth daily.    [DISCONTINUED] meloxicam (MOBIC) 15 MG tablet Take 0.5-1 tablets (7.5-15 mg total) by mouth daily.     [DISCONTINUED] mirtazapine (REMERON) 15 MG tablet TAKE 1/2 TABLETS BY MOUTH AT BEDTIME.    [DISCONTINUED] tamsulosin (FLOMAX) 0.4 MG CAPS capsule Take 1 capsule (0.4 mg total) by mouth daily.    losartan-hydrochlorothiazide (HYZAAR) 100-25 MG tablet Take 1 tablet by mouth daily.    meloxicam (MOBIC) 15 MG tablet Take 0.5-1 tablets (7.5-15 mg total) by mouth daily.    mirtazapine (REMERON) 15 MG tablet Take 1 tablet (15 mg total) by mouth at bedtime.    tamsulosin (FLOMAX) 0.4 MG CAPS capsule Take 1 capsule (0.4 mg total) by mouth daily.    No facility-administered encounter medications on file as of 11/30/2021.    Surgical History: Past Surgical History:  Procedure Laterality Date   BACK SURGERY     COLONOSCOPY WITH PROPOFOL N/A 11/11/2015   Procedure: COLONOSCOPY WITH PROPOFOL;  Surgeon: [name], MD;  [facility] ENDOSCOPY;  Service: Endoscopy;  Laterality: N/A;   COLONOSCOPY WITH PROPOFOL N/A 12/02/2015   Procedure: COLONOSCOPY WITH PROPOFOL;  Surgeon: [name], MD;  Location: [facility] ENDOSCOPY;  Service: Endoscopy;  Laterality: N/A;   COLONOSCOPY WITH PROPOFOL N/A 09/16/2019   Procedure: COLONOSCOPY WITH PROPOFOL;  Surgeon: Toledo, Benay Pike, MD;  Location: [facility] ENDOSCOPY;  Service: Gastroenterology;  Laterality: N/A;   ESOPHAGOGASTRODUODENOSCOPY (EGD) WITH PROPOFOL N/A 11/11/2015   Procedure: ESOPHAGOGASTRODUODENOSCOPY (EGD) WITH PROPOFOL;  Surgeon: [name], MD;  Location: [facility] ENDOSCOPY;  Service: Endoscopy;  Laterality: N/A;   ESOPHAGOGASTRODUODENOSCOPY (EGD) WITH PROPOFOL N/A 12/02/2015   Procedure: ESOPHAGOGASTRODUODENOSCOPY (EGD) WITH PROPOFOL;  Surgeon: [name], MD;  Location: [facility] ENDOSCOPY;  Service: Endoscopy;  Laterality: N/A;   ESOPHAGOGASTRODUODENOSCOPY (EGD) WITH PROPOFOL N/A 01/20/2016   Procedure: ESOPHAGOGASTRODUODENOSCOPY (EGD) WITH PROPOFOL;  Surgeon: [name], MD;  Location: [facility] ENDOSCOPY;  Service: Endoscopy;   Laterality: N/A;   ESOPHAGOGASTRODUODENOSCOPY (EGD) WITH PROPOFOL N/A 09/16/2019   Procedure: ESOPHAGOGASTRODUODENOSCOPY (EGD) WITH PROPOFOL;  Surgeon: Toledo, Benay Pike, MD;  Location: [facility] ENDOSCOPY;  Service: Gastroenterology;  Laterality: N/A;   KNEE RECONSTRUCTION Left     Medical History: Past Medical History:  Diagnosis Date   Back pain    Collagen vascular disease ([place])    Rhematoid Arthritis hx.   Constipation, chronic    DDD (degenerative disc disease), cervical    History of duodenal ulcer    History of kidney stones    Hyperlipidemia    Hypertension    Low kidney function    LT Kidney non-functioning   Nondiabetic gastroparesis    Peripheral blood vessel disorder (HCC)    Rheumatoid arthritis with rheumatoid factor (HCC)    Sleep apnea    Weight loss     Family History: Family History  Problem Relation Age of Onset   Diabetes Mother    Diabetes Father    Heart attack Maternal Grandmother    COPD Brother        08/2020   Tourette syndrome Son        in prison 2019   Learning disabilities Son     Social History   Socioeconomic History   Marital status: Married    Spouse name: Not on file   Number of children: Not on file   Years of education: Not on file   Highest education level: Not on file  Occupational History   Not on file  Tobacco Use   Smoking status: Some Days    Types: Cigars   Smokeless tobacco: Never   Tobacco comments:    Occasional  3 a day  Vaping Use   Vaping Use: Never used  Substance and Sexual Activity   Alcohol use: No   Drug use: No   Sexual activity: Yes    Birth control/protection: None  Other Topics Concern   Not on file  Social History Narrative   Not on file   Social Determinants of Health   Financial Resource Strain: Not on file  Food Insecurity: Not on file  Transportation Needs: Not on file  Physical Activity: Not on file  Stress: Not on file  Social Connections: Not on file  Intimate Partner Violence:  Not on file      Review of Systems  Constitutional:  Negative for chills and unexpected weight change.  HENT:  Negative for congestion, rhinorrhea, sneezing and sore throat.   Eyes:  Negative for redness.  Respiratory:  Negative for cough, chest tightness, shortness of breath and wheezing.   Cardiovascular: Negative.  Negative for chest pain and palpitations.  Gastrointestinal:  Negative for abdominal pain, constipation, diarrhea, nausea and vomiting.  Genitourinary:  Negative for dysuria and frequency.  Musculoskeletal:  Negative for arthralgias, back pain, joint swelling and neck pain.  Skin:  Negative for rash.  Neurological: Negative.  Negative for tremors and numbness.  Hematological:  Negative for adenopathy. Does not bruise/bleed easily.  Psychiatric/Behavioral:  Negative for behavioral problems (Depression), sleep disturbance and suicidal ideas. The patient is not nervous/anxious.    Vital  Signs: BP 140/80    Pulse 75    Temp 98.1 F (36.7 C)    Resp 16    Ht 5\' 7"  (1.702 m)    Wt 137 lb 6.4 oz (62.3 kg)    SpO2 98%    BMI 21.52 kg/m    Physical Exam Vitals reviewed.  Constitutional:      General: He is not in acute distress.    Appearance: Normal appearance. He is normal weight.  HENT:     Head: Normocephalic and atraumatic.  Eyes:     Pupils: Pupils are equal, round, and reactive to light.  Cardiovascular:     Rate and Rhythm: Normal rate and regular rhythm.  Pulmonary:     Effort: Pulmonary effort is normal. No respiratory distress.  Neurological:     Mental Status: He is alert and oriented to person, place, and time.  Psychiatric:        Mood and Affect: Mood normal.        Behavior: Behavior normal.       Assessment/Plan: 1. Essential hypertension BP is stable, refills ordered.  - losartan-hydrochlorothiazide (HYZAAR) 100-25 MG tablet; Take 1 tablet by mouth daily.  Dispense: 90 tablet; Refill: 0  2. Rheumatoid arthritis involving multiple sites with  positive rheumatoid factor (HCC) Refills ordered.  - meloxicam (MOBIC) 15 MG tablet; Take 0.5-1 tablets (7.5-15 mg total) by mouth daily.  Dispense: 30 tablet; Refill: 2  3. Renal calculus, left Refills ordered.  - tamsulosin (FLOMAX) 0.4 MG CAPS capsule; Take 1 capsule (0.4 mg total) by mouth daily.  Dispense: 90 capsule; Refill: 0  4. Non-seasonal allergic rhinitis due to other allergic trigger Try levocetirizine, take 1 tablet at bedtime.  - levocetirizine (XYZAL) 5 MG tablet; Take 1 tablet (5 mg total) by mouth every evening.  Dispense: 30 tablet; Refill: 2  5. Major depression, melancholic type Increased dose of mirtazapine to 15 mg at bedtime to help with his sleep - mirtazapine (REMERON) 15 MG tablet; Take 1 tablet (15 mg total) by mouth at bedtime.  Dispense: 90 tablet; Refill: 2   General Counseling: Izzac verbalizes understanding of the findings of todays visit and agrees with plan of treatment. I have discussed any further diagnostic evaluation that may be needed or ordered today. We also reviewed his medications today. he has been encouraged to call the office with any questions or concerns that should arise related to todays visit.    No orders of the defined types were placed in this encounter.   Meds ordered this encounter  Medications   levocetirizine (XYZAL) 5 MG tablet    Sig: Take 1 tablet (5 mg total) by mouth every evening.    Dispense:  30 tablet    Refill:  2    Patient has tried several OTC meds including zyrtec, claritin, allegra and benadryl.   losartan-hydrochlorothiazide (HYZAAR) 100-25 MG tablet    Sig: Take 1 tablet by mouth daily.    Dispense:  90 tablet    Refill:  0   meloxicam (MOBIC) 15 MG tablet    Sig: Take 0.5-1 tablets (7.5-15 mg total) by mouth daily.    Dispense:  30 tablet    Refill:  2   mirtazapine (REMERON) 15 MG tablet    Sig: Take 1 tablet (15 mg total) by mouth at bedtime.    Dispense:  90 tablet    Refill:  2    Note increased  dose and # of tablets. Please discontinue previous  orders for this medication.   tamsulosin (FLOMAX) 0.4 MG CAPS capsule    Sig: Take 1 capsule (0.4 mg total) by mouth daily.    Dispense:  90 capsule    Refill:  0    Return in about 1 month (around 12/31/2021) for F/U, eval new med, [place] PCP.   Total time spent:30 Minutes Time spent includes review of chart, medications, test results, and follow up plan with the patient.   [place] Controlled Substance Database was reviewed by me.  This patient was seen by Jonetta Osgood, FNP-C in collaboration with Dr. Clayborn Bigness as a part of collaborative care agreement.   Najat Olazabal R. Valetta Fuller, MSN, FNP-C Internal medicine

## 2021-12-22 ENCOUNTER — Other Ambulatory Visit: Payer: Self-pay

## 2021-12-22 ENCOUNTER — Ambulatory Visit (INDEPENDENT_AMBULATORY_CARE_PROVIDER_SITE_OTHER): Payer: Medicare Other | Admitting: Nurse Practitioner

## 2021-12-22 ENCOUNTER — Encounter: Payer: Self-pay | Admitting: Nurse Practitioner

## 2021-12-22 VITALS — BP 130/80 | HR 65 | Temp 98.7°F | Resp 16 | Ht 67.0 in | Wt 134.6 lb

## 2021-12-22 DIAGNOSIS — I10 Essential (primary) hypertension: Secondary | ICD-10-CM

## 2021-12-22 DIAGNOSIS — J3089 Other allergic rhinitis: Secondary | ICD-10-CM | POA: Diagnosis not present

## 2021-12-22 DIAGNOSIS — I700 Atherosclerosis of aorta: Secondary | ICD-10-CM | POA: Diagnosis not present

## 2021-12-22 DIAGNOSIS — G4709 Other insomnia: Secondary | ICD-10-CM

## 2021-12-22 NOTE — Progress Notes (Signed)
[facility] ?[address] ?[place], [place] [address] ? ?Internal MEDICINE  ?Office Visit Note ? ?Patient Name: Jose Cuevas ? [id number]  ?[id number] ? ?Date of Service: 12/22/2021 ? ?Chief Complaint  ?Patient presents with  ? Follow-up  ? Hyperlipidemia  ? Hypertension  ? ? ?HPI ?Jose Cuevas presents for follow-up visit for hypertension, hyperlipidemia, allergic rhinitis, and insomnia.  At his previous office visit, the patient reported that he was having a lot of issues with his environmental allergies and allergic rhinitis and he wanted to try something different that would be more effective to control his symptoms.  He also reported that he was having difficulty sleeping at night and the patient was on 7.5 mg of mirtazapine at bedtime. ?In his mirtazapine was increased to 15 mg at bedtime at that previous office visit and he reports that this increase has been helping him and he has been sleeping much better since then.  He was also started on levocetirizine for his allergic rhinitis symptoms and environmental allergies and he reports that this has also helped a lot and his eyes have not been watering as much and he has not been as congested or sneezing as much. ?His blood pressure is stable.  His other vital signs are normal as well. ? ? ?Current Medication: ?Outpatient Encounter Medications as of 12/22/2021  ?Medication Sig Note  ? Aspirin-Caffeine (BC FAST PAIN RELIEF ARTHRITIS PO) Take by mouth.   ? clonazePAM (KLONOPIN) 1 MG tablet Take 1 mg by mouth at bedtime as needed.   ? DULoxetine (CYMBALTA) 30 MG capsule Take 1 capsule (30 mg total) by mouth at bedtime.   ? folic acid (FOLVITE) 1 MG tablet Take 1 mg by mouth daily.   ? HUMIRA PEN 40 MG/0.4ML PNKT SMARTSIG:40 Milligram(s) SUB-Q Every 2 Weeks   ? levocetirizine (XYZAL) 5 MG tablet Take 1 tablet (5 mg total) by mouth every evening.   ? losartan-hydrochlorothiazide (HYZAAR) 100-25 MG tablet Take 1 tablet by mouth daily.   ? meloxicam (MOBIC) 15 MG  tablet Take 0.5-1 tablets (7.5-15 mg total) by mouth daily.   ? methotrexate 50 MG/2ML injection Inject 50 mg/m2 into the vein once. 0.71ms once every 2 weeks 06/19/2019: Wednesday   ? metoCLOPramide (REGLAN) 10 MG tablet Take by mouth.   ? mirtazapine (REMERON) 15 MG tablet Take 1 tablet (15 mg total) by mouth at bedtime.   ? Oxycodone HCl 10 MG TABS Take by mouth.   ? pantoprazole (PROTONIX) 40 MG tablet Take 40 mg by mouth 2 (two) times daily.   ? rosuvastatin (CRESTOR) 5 MG tablet TAKE 1 TABLET (5 MG TOTAL) BY MOUTH DAILY.   ? tamsulosin (FLOMAX) 0.4 MG CAPS capsule Take 1 capsule (0.4 mg total) by mouth daily.   ? vitamin B-12 (CYANOCOBALAMIN) 1000 MCG tablet Take 1,000 mcg by mouth daily.   ? ?No facility-administered encounter medications on file as of 12/22/2021.  ? ? ?Surgical History: ?Past Surgical History:  ?Procedure Laterality Date  ? BACK SURGERY    ? COLONOSCOPY WITH PROPOFOL N/A 11/11/2015  ? Procedure: COLONOSCOPY WITH PROPOFOL;  Surgeon: MJosefine Class MD;  [facility]  Service: Endoscopy;  Laterality: N/A;  ? COLONOSCOPY WITH PROPOFOL N/A 12/02/2015  ? Procedure: COLONOSCOPY WITH PROPOFOL;  Surgeon: MJosefine Class MD;  [facility]  Service: Endoscopy;  Laterality: N/A;  ? COLONOSCOPY WITH PROPOFOL N/A 09/16/2019  ? Procedure: COLONOSCOPY WITH PROPOFOL;  Surgeon: Toledo, TBenay Pike MD;  Location: [facility] ENDOSCOPY;  Service: Gastroenterology;  Laterality: N/A;  ? ESOPHAGOGASTRODUODENOSCOPY (EGD) WITH PROPOFOL N/A 11/11/2015  ? Procedure: ESOPHAGOGASTRODUODENOSCOPY (EGD) WITH PROPOFOL;  Surgeon: [name], MD;  [facility]COPY;  Service: Endoscopy;  Laterality: N/A;  ? ESOPHAGOGASTRODUODENOSCOPY (EGD) WITH PROPOFOL N/A 12/02/2015  ? Procedure: ESOPHAGOGASTRODUODENOSCOPY (EGD) WITH PROPOFOL;  Surgeon: [name], MD;  Location: [facility] ENDOSCOPY;  Service: Endoscopy;  Laterality: N/A;  ? ESOPHAGOGASTRODUODENOSCOPY (EGD) WITH PROPOFOL N/A  01/20/2016  ? Procedure: ESOPHAGOGASTRODUODENOSCOPY (EGD) WITH PROPOFOL;  Surgeon: [name], MD;  Location: [facility] ENDOSCOPY;  Service: Endoscopy;  Laterality: N/A;  ? ESOPHAGOGASTRODUODENOSCOPY (EGD) WITH PROPOFOL N/A 09/16/2019  ? Procedure: ESOPHAGOGASTRODUODENOSCOPY (EGD) WITH PROPOFOL;  Surgeon: Toledo, Benay Pike, MD;  Location: [facility] ENDOSCOPY;  Service: Gastroenterology;  Laterality: N/A;  ? KNEE RECONSTRUCTION Left   ? ? ?Medical History: ?Past Medical History:  ?Diagnosis Date  ? Back pain   ? Collagen vascular disease ([place])   ? Rhematoid Arthritis hx.  ? Constipation, chronic   ? DDD (degenerative disc disease), cervical   ? History of duodenal ulcer   ? History of kidney stones   ? Hyperlipidemia   ? Hypertension   ? Low kidney function   ? LT Kidney non-functioning  ? Nondiabetic gastroparesis   ? Peripheral blood vessel disorder ([place])   ? Rheumatoid arthritis with rheumatoid factor (HCC)   ? Sleep apnea   ? Weight loss   ? ? ?Family History: ?Family History  ?Problem Relation Age of Onset  ? Diabetes Mother   ? Diabetes Father   ? Heart attack Maternal Grandmother   ? COPD Brother   ?     08/2020  ? Tourette syndrome Son   ?     in prison 2019  ? Learning disabilities Son   ? ? ?Social History  ? ?Socioeconomic History  ? Marital status: Married  ?  Spouse name: Not on file  ? Number of children: Not on file  ? Years of education: Not on file  ? Highest education level: Not on file  ?Occupational History  ? Not on file  ?Tobacco Use  ? Smoking status: Some Days  ?  Types: Cigars  ? Smokeless tobacco: Never  ? Tobacco comments:  ?  Occasional  3 a day  ?Vaping Use  ? Vaping Use: Never used  ?Substance and Sexual Activity  ? Alcohol use: No  ? Drug use: No  ? Sexual activity: Yes  ?  Birth control/protection: None  ?Other Topics Concern  ? Not on file  ?Social History Narrative  ? Not on file  ? ?Social Determinants of Health  ? ?Financial Resource Strain: Not on file  ?Food Insecurity: Not on file   ?Transportation Needs: Not on file  ?Physical Activity: Not on file  ?Stress: Not on file  ?Social Connections: Not on file  ?Intimate Partner Violence: Not on file  ? ? ? ? ?Review of Systems  ?Constitutional:  Negative for chills, fatigue and unexpected weight change.  ?HENT:  Negative for congestion, rhinorrhea, sneezing and sore throat.   ?     Allergy symptoms improved  ?Eyes:  Negative for redness.  ?Respiratory: Negative.  Negative for cough, chest tightness and shortness of breath.   ?Cardiovascular: Negative.  Negative for chest pain and palpitations.  ?Gastrointestinal: Negative.  Negative for abdominal pain, constipation, diarrhea, nausea and vomiting.  ?Genitourinary:  Negative for dysuria and frequency.  ?Musculoskeletal:  Negative for arthralgias, back pain, joint swelling and neck pain.  ?Skin:  Negative for rash.  ?Neurological: Negative.  Negative for tremors and numbness.  ?Hematological:  Negative for adenopathy. Does not bruise/bleed easily.  ?Psychiatric/Behavioral:  Negative for behavioral problems (Depression), sleep disturbance and suicidal ideas. The patient is not nervous/anxious.   ? ?Vital Signs: ?BP 130/80   Pulse 65   Temp 98.7 ?F (37.1 ?C)   Resp 16   Ht '5\' 7"'$  (1.702 m)   Wt 134 lb 9.6 oz (61.1 kg)   SpO2 98%   BMI 21.08 kg/m?  ? ? ?Physical Exam ?Vitals reviewed.  ?Constitutional:   ?   General: He is not in acute distress. ?   Appearance: Normal appearance. He is normal weight. He is not ill-appearing.  ?HENT:  ?   Head: Normocephalic and atraumatic.  ?Eyes:  ?   Pupils: Pupils are equal, round, and reactive to light.  ?Cardiovascular:  ?   Rate and Rhythm: Normal rate and regular rhythm.  ?Pulmonary:  ?   Effort: Pulmonary effort is normal. No respiratory distress.  ?Neurological:  ?   Mental Status: He is alert and oriented to person, place, and time.  ?   Gait: Gait normal.  ?Psychiatric:     ?   Mood and Affect: Mood normal.     ?   Behavior: Behavior normal.   ? ? ? ? ? ?Assessment/Plan: ?1. Essential hypertension ?Blood pressure stable, continue current medications, follow-up in 3 months. ? ?2. Other insomnia ?Continue mirtazapine 15 mg at bedtime ? ?3. Non-seasonal allergic rhinitis

## 2022-01-22 ENCOUNTER — Other Ambulatory Visit: Payer: Self-pay

## 2022-02-12 ENCOUNTER — Other Ambulatory Visit: Payer: Self-pay | Admitting: Nurse Practitioner

## 2022-02-12 DIAGNOSIS — J3089 Other allergic rhinitis: Secondary | ICD-10-CM

## 2022-02-28 ENCOUNTER — Other Ambulatory Visit: Payer: Self-pay | Admitting: Nurse Practitioner

## 2022-02-28 DIAGNOSIS — I10 Essential (primary) hypertension: Secondary | ICD-10-CM

## 2022-03-23 ENCOUNTER — Encounter: Payer: Self-pay | Admitting: Nurse Practitioner

## 2022-03-23 ENCOUNTER — Ambulatory Visit (INDEPENDENT_AMBULATORY_CARE_PROVIDER_SITE_OTHER): Payer: Medicare Other | Admitting: Nurse Practitioner

## 2022-03-23 VITALS — BP 139/63 | HR 60 | Temp 98.3°F | Resp 16 | Ht 67.0 in | Wt 131.6 lb

## 2022-03-23 DIAGNOSIS — F329 Major depressive disorder, single episode, unspecified: Secondary | ICD-10-CM

## 2022-03-23 DIAGNOSIS — E538 Deficiency of other specified B group vitamins: Secondary | ICD-10-CM

## 2022-03-23 DIAGNOSIS — R5382 Chronic fatigue, unspecified: Secondary | ICD-10-CM

## 2022-03-23 DIAGNOSIS — R5381 Other malaise: Secondary | ICD-10-CM

## 2022-03-23 DIAGNOSIS — E782 Mixed hyperlipidemia: Secondary | ICD-10-CM

## 2022-03-23 DIAGNOSIS — E7841 Elevated Lipoprotein(a): Secondary | ICD-10-CM

## 2022-03-23 DIAGNOSIS — J432 Centrilobular emphysema: Secondary | ICD-10-CM | POA: Diagnosis not present

## 2022-03-23 DIAGNOSIS — F17200 Nicotine dependence, unspecified, uncomplicated: Secondary | ICD-10-CM

## 2022-03-23 DIAGNOSIS — G4710 Hypersomnia, unspecified: Secondary | ICD-10-CM

## 2022-03-23 DIAGNOSIS — E559 Vitamin D deficiency, unspecified: Secondary | ICD-10-CM

## 2022-03-23 DIAGNOSIS — N200 Calculus of kidney: Secondary | ICD-10-CM | POA: Diagnosis not present

## 2022-03-23 MED ORDER — DULOXETINE HCL 30 MG PO CPEP: 30.0000 mg | ORAL_CAPSULE | Freq: Every day | ORAL | 1 refills | Status: DC

## 2022-03-23 MED ORDER — ROSUVASTATIN CALCIUM 5 MG PO TABS: 5.0000 mg | ORAL_TABLET | Freq: Every day | ORAL | 1 refills | Status: DC

## 2022-03-23 MED ORDER — TAMSULOSIN HCL 0.4 MG PO CAPS: 0.4000 mg | ORAL_CAPSULE | Freq: Every day | ORAL | 3 refills | Status: DC

## 2022-03-23 NOTE — Progress Notes (Signed)
[facility] [address] [place], [place] [address]  Internal MEDICINE  Office Visit Note  Patient Name: Jose Cuevas  [id number]  [id number]  Date of Service: 03/23/2022  Chief Complaint  Patient presents with   Follow-up   Hyperlipidemia   Hypertension   Fatigue    Brain fog over the last 3 weeks    HPI Aban presents for a follow up visit for fatigue, and brain fog x3 weeks as well as hypertension and hyperlipidemia. His duloxetine dose was increased by his rheumatologist for musculoskeletal pain but then he was sleeping all day and night and had difficulty staying awake when he was up during the day. He went back to taking 30 mg about 1 week ago and is feeling better and more awake during the day. Fatigue and hypersomnia is starting to improve.  Has been having low energy and poor concentration Smokes 1/2 pack of cigars per day, denies any alcohol or recreational drug use.  Sleeping well at night, eating good meals and reports drinking enough water.      Current Medication: Outpatient Encounter Medications as of 03/23/2022  Medication Sig Note   Aspirin-Caffeine (BC FAST PAIN RELIEF ARTHRITIS PO) Take by mouth.    clonazePAM (KLONOPIN) 1 MG tablet Take 1 mg by mouth at bedtime as needed.    folic acid (FOLVITE) 1 MG tablet Take 1 mg by mouth daily.    HUMIRA PEN 40 MG/0.4ML PNKT SMARTSIG:40 Milligram(s) SUB-Q Every 2 Weeks    levocetirizine (XYZAL) 5 MG tablet TAKE 1 TABLET BY MOUTH EVERY DAY IN THE EVENING    losartan-hydrochlorothiazide (HYZAAR) 100-25 MG tablet TAKE 1 TABLET BY MOUTH EVERY DAY    methotrexate 50 MG/2ML injection Inject 50 mg/m2 into the vein once. 0.66mls once every 2 weeks 06/19/2019: Wednesday    metoCLOPramide (REGLAN) 10 MG tablet Take by mouth.    mirtazapine (REMERON) 15 MG tablet Take 1 tablet (15 mg total) by mouth at bedtime.    Oxycodone HCl 10 MG TABS Take by mouth.    pantoprazole (PROTONIX) 40 MG tablet Take 40 mg by mouth 2 (two)  times daily.    vitamin B-12 (CYANOCOBALAMIN) 1000 MCG tablet Take 1,000 mcg by mouth daily.    [DISCONTINUED] DULoxetine (CYMBALTA) 30 MG capsule Take 1 capsule (30 mg total) by mouth at bedtime.    [DISCONTINUED] rosuvastatin (CRESTOR) 5 MG tablet TAKE 1 TABLET (5 MG TOTAL) BY MOUTH DAILY.    [DISCONTINUED] tamsulosin (FLOMAX) 0.4 MG CAPS capsule Take 1 capsule (0.4 mg total) by mouth daily.    DULoxetine (CYMBALTA) 30 MG capsule Take 1 capsule (30 mg total) by mouth at bedtime.    rosuvastatin (CRESTOR) 5 MG tablet Take 1 tablet (5 mg total) by mouth daily.    tamsulosin (FLOMAX) 0.4 MG CAPS capsule Take 1 capsule (0.4 mg total) by mouth daily.    No facility-administered encounter medications on file as of 03/23/2022.    Surgical History: Past Surgical History:  Procedure Laterality Date   BACK SURGERY     COLONOSCOPY WITH PROPOFOL N/A 11/11/2015   Procedure: COLONOSCOPY WITH PROPOFOL;  Surgeon: Elnita Maxwell, MD;  [facility];  Service: Endoscopy;  Laterality: N/A;   COLONOSCOPY WITH PROPOFOL N/A 12/02/2015   Procedure: COLONOSCOPY WITH PROPOFOL;  Surgeon: Elnita Maxwell, MD;  Location: [facility] ENDOSCOPY;  Service: Endoscopy;  Laterality: N/A;   COLONOSCOPY WITH PROPOFOL N/A 09/16/2019   Procedure: COLONOSCOPY WITH PROPOFOL;  Surgeon: Norma Fredrickson, Boykin Nearing, MD;  Location: [facility] ENDOSCOPY;  Service: Gastroenterology;  Laterality: N/A;   ESOPHAGOGASTRODUODENOSCOPY (EGD) WITH PROPOFOL N/A 11/11/2015   Procedure: ESOPHAGOGASTRODUODENOSCOPY (EGD) WITH PROPOFOL;  Surgeon: Elnita Maxwell, MD;  Location: [facility] ENDOSCOPY;  Service: Endoscopy;  Laterality: N/A;   ESOPHAGOGASTRODUODENOSCOPY (EGD) WITH PROPOFOL N/A 12/02/2015   Procedure: ESOPHAGOGASTRODUODENOSCOPY (EGD) WITH PROPOFOL;  Surgeon: Elnita Maxwell, MD;  Location: [facility] ENDOSCOPY;  Service: Endoscopy;  Laterality: N/A;   ESOPHAGOGASTRODUODENOSCOPY (EGD) WITH PROPOFOL N/A 01/20/2016   Procedure:  ESOPHAGOGASTRODUODENOSCOPY (EGD) WITH PROPOFOL;  Surgeon: Elnita Maxwell, MD;  Location: [facility] ENDOSCOPY;  Service: Endoscopy;  Laterality: N/A;   ESOPHAGOGASTRODUODENOSCOPY (EGD) WITH PROPOFOL N/A 09/16/2019   Procedure: ESOPHAGOGASTRODUODENOSCOPY (EGD) WITH PROPOFOL;  Surgeon: [place], Boykin Nearing, MD;  Location: [facility] ENDOSCOPY;  Service: Gastroenterology;  Laterality: N/A;   KNEE RECONSTRUCTION Left     Medical History: Past Medical History:  Diagnosis Date   Back pain    Collagen vascular disease (HCC)    Rhematoid Arthritis hx.   Constipation, chronic    DDD (degenerative disc disease), cervical    History of duodenal ulcer    History of kidney stones    Hyperlipidemia    Hypertension    Low kidney function    LT Kidney non-functioning   Nondiabetic gastroparesis    Peripheral blood vessel disorder (HCC)    Rheumatoid arthritis with rheumatoid factor (HCC)    Sleep apnea    Weight loss     Family History: Family History  Problem Relation Age of Onset   Diabetes Mother    Diabetes Father    Heart attack Maternal Grandmother    COPD Brother        08/2020   Tourette syndrome Son        in prison 2019   Learning disabilities Son     Social History   Socioeconomic History   Marital status: Married    Spouse name: Not on file   Number of children: Not on file   Years of education: Not on file   Highest education level: Not on file  Occupational History   Not on file  Tobacco Use   Smoking status: Some Days    Types: Cigars   Smokeless tobacco: Never   Tobacco comments:    Occasional  3 a day  Vaping Use   Vaping Use: Never used  Substance and Sexual Activity   Alcohol use: No   Drug use: No   Sexual activity: Yes    Birth control/protection: None  Other Topics Concern   Not on file  Social History Narrative   Not on file   Social Determinants of Health   Financial Resource Strain: Not on file  Food Insecurity: Not on file  Transportation Needs:  Not on file  Physical Activity: Not on file  Stress: Not on file  Social Connections: Not on file  Intimate Partner Violence: Not on file      Review of Systems  Constitutional:  Negative for chills, fatigue and unexpected weight change.  HENT:  Positive for postnasal drip. Negative for congestion, rhinorrhea, sneezing and sore throat.   Eyes:  Negative for redness.  Respiratory:  Negative for cough, chest tightness and shortness of breath.   Cardiovascular:  Negative for chest pain and palpitations.  Gastrointestinal:  Negative for abdominal pain, constipation, diarrhea, nausea and vomiting.  Genitourinary:  Negative for dysuria and frequency.  Musculoskeletal:  Negative for arthralgias, back pain, joint swelling and neck pain.  Skin:  Negative for rash.  Neurological: Negative.  Negative for tremors and numbness.  Hematological:  Negative for adenopathy. Does not bruise/bleed easily.  Psychiatric/Behavioral:  Negative for behavioral problems (Depression), sleep disturbance and suicidal ideas. The patient is not nervous/anxious.     Vital Signs: BP 139/63   Pulse 60   Temp 98.3 F (36.8 C)   Resp 16   Ht 5\' 7"  (1.702 m)   Wt 131 lb 9.6 oz (59.7 kg)   SpO2 97%   BMI 20.61 kg/m    Physical Exam Vitals reviewed.  Constitutional:      General: He is not in acute distress.    Appearance: Normal appearance. He is normal weight. He is not ill-appearing.  HENT:     Head: Normocephalic and atraumatic.  Eyes:     Pupils: Pupils are equal, round, and reactive to light.  Cardiovascular:     Rate and Rhythm: Normal rate and regular rhythm.  Pulmonary:     Effort: Pulmonary effort is normal. No respiratory distress.  Neurological:     Mental Status: He is alert and oriented to person, place, and time.  Psychiatric:        Mood and Affect: Mood normal.        Behavior: Behavior normal.        Assessment/Plan: 1. Chronic fatigue and malaise Labs ordered to evaluate  fatigue and low energy.  - Iron, TIBC and Ferritin Panel - Vitamin D (25 hydroxy) - B12 and Folate Panel - TSH + free T4 - Basic Metabolic Panel (BMET)  2. Centrilobular emphysema (HCC) Continue current medications, still smoking cigars daily.   3. Mixed hyperlipidemia Rosuvastatin refills ordered., routine labs ordered.  - rosuvastatin (CRESTOR) 5 MG tablet; Take 1 tablet (5 mg total) by mouth daily.  Dispense: 90 tablet; Refill: 1 - Lipid Profile - TSH + free T4 - Basic Metabolic Panel (BMET)  4. Renal calculus, left Continue tamsulosin, refills ordered.  - tamsulosin (FLOMAX) 0.4 MG CAPS capsule; Take 1 capsule (0.4 mg total) by mouth daily.  Dispense: 90 capsule; Refill: 3  5. Vitamin D deficiency Lab ordered.  - Vitamin D (25 hydroxy)  6. B12 deficiency Lab ordered.  - B12 and Folate Panel  7. Hypersomnia Slightly improved, labs ordered.  - TSH + free T4  8. Major depression, melancholic type Duloxetine dose decreased to 30 mg daily.  - DULoxetine (CYMBALTA) 30 MG capsule; Take 1 capsule (30 mg total) by mouth at bedtime.  Dispense: 90 capsule; Refill: 1   General Counseling: Renault verbalizes understanding of the findings of todays visit and agrees with plan of treatment. I have discussed any further diagnostic evaluation that may be needed or ordered today. We also reviewed his medications today. he has been encouraged to call the office with any questions or concerns that should arise related to todays visit.    Orders Placed This Encounter  Procedures   Iron, TIBC and Ferritin Panel   Lipid Profile   Vitamin D (25 hydroxy)   B12 and Folate Panel   TSH + free T4   Basic Metabolic Panel (BMET)    Meds ordered this encounter  Medications   DULoxetine (CYMBALTA) 30 MG capsule    Sig: Take 1 capsule (30 mg total) by mouth at bedtime.    Dispense:  90 capsule    Refill:  1    Please discontinue 60 mg dose, patient decreased by back 30 mg due to increased  side effects at 60 mg dose. Please fill today.  rosuvastatin (CRESTOR) 5 MG tablet    Sig: Take 1 tablet (5 mg total) by mouth daily.    Dispense:  90 tablet    Refill:  1   tamsulosin (FLOMAX) 0.4 MG CAPS capsule    Sig: Take 1 capsule (0.4 mg total) by mouth daily.    Dispense:  90 capsule    Refill:  3    Return in about 3 weeks (around 04/13/2022) for F/U, Labs, Denessa Cavan PCP.   Total time spent:30 Minutes Time spent includes review of chart, medications, test results, and follow up plan with the patient.   [place] Controlled Substance Database was reviewed by me.  This patient was seen by Sallyanne Kuster, FNP-C in collaboration with Dr. Beverely Risen as a part of collaborative care agreement.   Lofton Leon R. Tedd Sias, MSN, FNP-C Internal medicine

## 2022-03-25 ENCOUNTER — Encounter: Payer: Self-pay | Admitting: Nurse Practitioner

## 2022-04-23 ENCOUNTER — Encounter: Payer: Self-pay | Admitting: Nurse Practitioner

## 2022-04-23 ENCOUNTER — Ambulatory Visit (INDEPENDENT_AMBULATORY_CARE_PROVIDER_SITE_OTHER): Payer: Medicare Other | Admitting: Nurse Practitioner

## 2022-04-23 VITALS — BP 138/61 | HR 74 | Temp 98.0°F | Resp 16 | Ht 67.0 in | Wt 134.0 lb

## 2022-04-23 DIAGNOSIS — I10 Essential (primary) hypertension: Secondary | ICD-10-CM

## 2022-04-23 DIAGNOSIS — R5383 Other fatigue: Secondary | ICD-10-CM

## 2022-04-23 DIAGNOSIS — M0579 Rheumatoid arthritis with rheumatoid factor of multiple sites without organ or systems involvement: Secondary | ICD-10-CM | POA: Diagnosis not present

## 2022-04-23 DIAGNOSIS — G4710 Hypersomnia, unspecified: Secondary | ICD-10-CM

## 2022-04-23 MED ORDER — HUMIRA (2 PEN) 40 MG/0.4ML ~~LOC~~ AJKT: 40.0000 mg | AUTO-INJECTOR | SUBCUTANEOUS | 3 refills | Status: DC

## 2022-04-23 NOTE — Progress Notes (Signed)
[facility] [place], [place] [address]  Internal MEDICINE  Office Visit Note  Patient Name: Jose Cuevas  [id number]  [id number]  Date of Service: 04/23/2022  Chief Complaint  Patient presents with   Follow-up    Labs    Hypertension   Hyperlipidemia    HPI Jose Cuevas presents for follow-up visit for hypertension, hyperlipidemia and to reevaluate medication adjustment. He had labs ordered at his previous office visit but has not had them drawn yet so those will be discussed at a later date --Blood pressure is stable with current medications --Earlier this year, Jose Cuevas had his duloxetine dose increased to 60 mg daily but this was making him too sleepy and fatigued and not able to stay awake and do much during the day.  At his previous office visit it had been discussed that he has went back to the 30 mg dose was already seeing some improvements in his alertness during the day.  His sister accompanied him to the previous visit and today's visit.  Jose Cuevas and his sister both confirmed that he has been much more alert and awake during the day with the 30 mg dose now that he has been on the decreased dose for several more weeks. He denies having any difficulty sleeping at night with a deep decreased dose but also reports that he is not having any problem with staying awake during the day either.   Current Medication: Outpatient Encounter Medications as of 04/23/2022  Medication Sig Note   Aspirin-Caffeine (BC FAST PAIN RELIEF ARTHRITIS PO) Take by mouth.    clonazePAM (KLONOPIN) 1 MG tablet Take 1 mg by mouth at bedtime as needed.    DULoxetine (CYMBALTA) 30 MG capsule Take 1 capsule (30 mg total) by mouth at bedtime.    folic acid (FOLVITE) 1 MG tablet Take 1 mg by mouth daily.    levocetirizine (XYZAL) 5 MG tablet TAKE 1 TABLET BY MOUTH EVERY DAY IN THE EVENING    losartan-hydrochlorothiazide (HYZAAR) 100-25 MG tablet TAKE 1 TABLET BY MOUTH EVERY DAY    methotrexate 50  MG/2ML injection Inject 50 mg/m2 into the vein once. 0.32ms once every 2 weeks 06/19/2019: Wednesday    metoCLOPramide (REGLAN) 10 MG tablet Take by mouth.    mirtazapine (REMERON) 15 MG tablet Take 1 tablet (15 mg total) by mouth at bedtime.    Oxycodone HCl 10 MG TABS Take by mouth.    pantoprazole (PROTONIX) 40 MG tablet Take 40 mg by mouth 2 (two) times daily.    rosuvastatin (CRESTOR) 5 MG tablet Take 1 tablet (5 mg total) by mouth daily.    tamsulosin (FLOMAX) 0.4 MG CAPS capsule Take 1 capsule (0.4 mg total) by mouth daily.    vitamin B-12 (CYANOCOBALAMIN) 1000 MCG tablet Take 1,000 mcg by mouth daily.    [DISCONTINUED] HUMIRA PEN 40 MG/0.4ML PNKT SMARTSIG:40 Milligram(s) SUB-Q Every 2 Weeks    Adalimumab (HUMIRA PEN) 40 MG/0.4ML PNKT Inject 40 mg into the skin every 14 (fourteen) days.    No facility-administered encounter medications on file as of 04/23/2022.    Surgical History: Past Surgical History:  Procedure Laterality Date   BACK SURGERY     COLONOSCOPY WITH PROPOFOL N/A 11/11/2015   Procedure: COLONOSCOPY WITH PROPOFOL;  Surgeon: MJosefine Class MD;  [facility]  Service: Endoscopy;  Laterality: N/A;   COLONOSCOPY WITH PROPOFOL N/A 12/02/2015   Procedure: COLONOSCOPY WITH PROPOFOL;  Surgeon: MJosefine Class MD;  [facility]  Service: Endoscopy;  Laterality: N/A;   COLONOSCOPY WITH PROPOFOL N/A 09/16/2019   Procedure: COLONOSCOPY WITH PROPOFOL;  Surgeon: Jose Cuevas, Jose Pike, MD;  Location: [facility] ENDOSCOPY;  Service: Gastroenterology;  Laterality: N/A;   ESOPHAGOGASTRODUODENOSCOPY (EGD) WITH PROPOFOL N/A 11/11/2015   Procedure: ESOPHAGOGASTRODUODENOSCOPY (EGD) WITH PROPOFOL;  Surgeon: Jose Class, MD;  Location: [facility] ENDOSCOPY;  Service: Endoscopy;  Laterality: N/A;   ESOPHAGOGASTRODUODENOSCOPY (EGD) WITH PROPOFOL N/A 12/02/2015   Procedure: ESOPHAGOGASTRODUODENOSCOPY (EGD) WITH PROPOFOL;  Surgeon: Jose Class, MD;  Location: [facility]  ENDOSCOPY;  Service: Endoscopy;  Laterality: N/A;   ESOPHAGOGASTRODUODENOSCOPY (EGD) WITH PROPOFOL N/A 01/20/2016   Procedure: ESOPHAGOGASTRODUODENOSCOPY (EGD) WITH PROPOFOL;  Surgeon: Jose Class, MD;  Location[facility] ENDOSCOPY;  Service: Endoscopy;  Laterality: N/A;   ESOPHAGOGASTRODUODENOSCOPY (EGD) WITH PROPOFOL N/A 09/16/2019   Procedure: ESOPHAGOGASTRODUODENOSCOPY (EGD) WITH PROPOFOL;  Surgeon: Jose Cuevas, Jose Pike, MD;  Location: [facility] ENDOSCOPY;  Service: Gastroenterology;  Laterality: N/A;   KNEE RECONSTRUCTION Left     Medical History: Past Medical History:  Diagnosis Date   Back pain    Collagen vascular disease ([place])    Rhematoid Arthritis hx.   Constipation, chronic    DDD (degenerative disc disease), cervical    History of duodenal ulcer    History of kidney stones    Hyperlipidemia    Hypertension    Low kidney function    LT Kidney non-functioning   Nondiabetic gastroparesis    Peripheral blood vessel disorder (HCC)    Rheumatoid arthritis with rheumatoid factor (HCC)    Sleep apnea    Weight loss     Family History: Family History  Problem Relation Age of Onset   Diabetes Mother    Diabetes Father    Heart attack Maternal Grandmother    COPD Brother        08/2020   Tourette syndrome Son        in prison 2019   Learning disabilities Son     Social History   Socioeconomic History   Marital status: Married    Spouse name: Not on file   Number of children: Not on file   Years of education: Not on file   Highest education level: Not on file  Occupational History   Not on file  Tobacco Use   Smoking status: Some Days    Types: Cigars   Smokeless tobacco: Never   Tobacco comments:    Occasional  3 a day  Vaping Use   Vaping Use: Never used  Substance and Sexual Activity   Alcohol use: No   Drug use: No   Sexual activity: Yes    Birth control/protection: None  Other Topics Concern   Not on file  Social History Narrative   Not on file    Social Determinants of Health   Financial Resource Strain: Not on file  Food Insecurity: Not on file  Transportation Needs: Not on file  Physical Activity: Not on file  Stress: Not on file  Social Connections: Not on file  Intimate Partner Violence: Not on file      Review of Systems  Constitutional:  Negative for chills, fatigue and unexpected weight change.  HENT:  Positive for postnasal drip. Negative for congestion, rhinorrhea, sneezing and sore throat.   Eyes:  Negative for redness.  Respiratory:  Negative for cough, chest tightness and shortness of breath.   Cardiovascular:  Negative for chest pain and palpitations.  Gastrointestinal:  Negative for abdominal pain, constipation, diarrhea, nausea and vomiting.  Genitourinary:  Negative for dysuria and frequency.  Musculoskeletal:  Negative for arthralgias, back pain, joint swelling and neck pain.  Skin:  Negative for rash.  Neurological: Negative.  Negative for tremors and numbness.  Hematological:  Negative for adenopathy. Does not bruise/bleed easily.  Psychiatric/Behavioral:  Negative for behavioral problems (Depression), sleep disturbance and suicidal ideas. The patient is not nervous/anxious.     Vital Signs: BP 138/61   Pulse 74   Temp 98 F (36.7 C)   Resp 16   Ht '5\' 7"'$  (1.702 m)   Wt 134 lb (60.8 kg)   SpO2 99%   BMI 20.99 kg/m    Physical Exam Vitals reviewed.  Constitutional:      General: He is not in acute distress.    Appearance: Normal appearance. He is normal weight. He is not ill-appearing.  HENT:     Head: Normocephalic and atraumatic.  Eyes:     Pupils: Pupils are equal, round, and reactive to light.  Cardiovascular:     Rate and Rhythm: Normal rate and regular rhythm.  Pulmonary:     Effort: Pulmonary effort is normal. No respiratory distress.  Neurological:     Mental Status: He is alert and oriented to person, place, and time.  Psychiatric:        Mood and Affect: Mood normal.         Behavior: Behavior normal.        Assessment/Plan: 1. Essential hypertension Stable with current medications, no changes.   2. Other fatigue Intermittent. Symptom of RA, comes and goes with episodes and flare up. Reports that he is doing ok today.   3. Hypersomnia Improved since decreasing duloxetine dose.   4. Rheumatoid arthritis involving multiple sites with positive rheumatoid factor ([place]) His rheumatologist has retired, their office is in the process of getting him in with his new rheumatologist but the patient reports he has been without his medication for 3 months now and they do not seem to be doing anything to push the PA through or get the medication refills/renewed or approved even though he has called, was told he needs to see the new provider first. He has been on the same medication for a long time. Will send in prescription and see if they are able to approve it from primary care but patient was informed that sometimes insurance will not approve unless it is ordered by their specialist for medications like humira and others.   General Counseling: Landan verbalizes understanding of the findings of todays visit and agrees with plan of treatment. I have discussed any further diagnostic evaluation that may be needed or ordered today. We also reviewed his medications today. he has been encouraged to call the office with any questions or concerns that should arise related to todays visit.    No orders of the defined types were placed in this encounter.   Meds ordered this encounter  Medications   Adalimumab (HUMIRA PEN) 40 MG/0.4ML PNKT    Sig: Inject 40 mg into the skin every 14 (fourteen) days.    Dispense:  6 each    Refill:  3    Rheumatologist retired and he is not due to see the new rheumatologist until august and has not had his injections in 3 months. Needs this ASAP.    Return in about 3 months (around 07/24/2022) for F/U, Nilo Fallin PCP general, reminded pt to  get labs drawn.   Total time spent:30 Minutes Time spent includes review of chart, medications, test  results, and follow up plan with the patient.   [place] Controlled Substance Database was reviewed by me.  This patient was seen by Jonetta Osgood, FNP-C in collaboration with Dr. Clayborn Bigness as a part of collaborative care agreement.   Deaveon Schoen R. Valetta Fuller, MSN, FNP-C Internal medicine

## 2022-05-01 ENCOUNTER — Telehealth: Payer: Self-pay

## 2022-05-03 ENCOUNTER — Other Ambulatory Visit: Payer: Self-pay | Admitting: Internal Medicine

## 2022-05-03 DIAGNOSIS — M0579 Rheumatoid arthritis with rheumatoid factor of multiple sites without organ or systems involvement: Secondary | ICD-10-CM

## 2022-05-03 NOTE — Telephone Encounter (Signed)
Thanks

## 2022-05-03 NOTE — Telephone Encounter (Signed)
I sent a referral for Rheumatology

## 2022-05-03 NOTE — Telephone Encounter (Signed)
Called and spoke to [place] at [facility] and advised that we feel that they should be helping patient get his Humira injections.  Jinny Blossom took my information and will get with the nurse and see what needs to be done.  I advised that pt has been out of his medication for a little bit and with them being his specialist they should be taking care of refilling patients medication until he can get in to see the new provider.  I am also faxing the paperwork for a PA for the Humira the we got when trying to fill the medication for [facility]..  fax to [facility]: [phone]

## 2022-05-09 ENCOUNTER — Ambulatory Visit: Payer: Medicare Other | Admitting: Nurse Practitioner

## 2022-05-11 ENCOUNTER — Telehealth: Payer: Self-pay

## 2022-05-11 NOTE — Telephone Encounter (Signed)
Left vm and sent mychart message to confirm 05/17/22 appointment-Toni 

## 2022-05-17 ENCOUNTER — Ambulatory Visit: Payer: Medicare Other | Admitting: Nurse Practitioner

## 2022-05-26 ENCOUNTER — Encounter: Payer: Self-pay | Admitting: Nurse Practitioner

## 2022-07-23 ENCOUNTER — Encounter: Payer: Self-pay | Admitting: Nurse Practitioner

## 2022-07-23 ENCOUNTER — Ambulatory Visit (INDEPENDENT_AMBULATORY_CARE_PROVIDER_SITE_OTHER): Payer: Medicare Other | Admitting: Nurse Practitioner

## 2022-07-23 VITALS — BP 180/85 | HR 75 | Temp 97.4°F | Resp 16 | Ht 67.0 in | Wt 140.3 lb

## 2022-07-23 DIAGNOSIS — Z760 Encounter for issue of repeat prescription: Secondary | ICD-10-CM | POA: Diagnosis not present

## 2022-07-23 DIAGNOSIS — I10 Essential (primary) hypertension: Secondary | ICD-10-CM | POA: Diagnosis not present

## 2022-07-23 DIAGNOSIS — G894 Chronic pain syndrome: Secondary | ICD-10-CM | POA: Diagnosis not present

## 2022-07-23 DIAGNOSIS — F329 Major depressive disorder, single episode, unspecified: Secondary | ICD-10-CM

## 2022-07-23 DIAGNOSIS — M0579 Rheumatoid arthritis with rheumatoid factor of multiple sites without organ or systems involvement: Secondary | ICD-10-CM

## 2022-07-23 DIAGNOSIS — G8929 Other chronic pain: Secondary | ICD-10-CM

## 2022-07-23 DIAGNOSIS — M5442 Lumbago with sciatica, left side: Secondary | ICD-10-CM | POA: Diagnosis not present

## 2022-07-23 DIAGNOSIS — E782 Mixed hyperlipidemia: Secondary | ICD-10-CM

## 2022-07-23 DIAGNOSIS — J432 Centrilobular emphysema: Secondary | ICD-10-CM

## 2022-07-23 DIAGNOSIS — Z23 Encounter for immunization: Secondary | ICD-10-CM | POA: Diagnosis not present

## 2022-07-23 DIAGNOSIS — M5441 Lumbago with sciatica, right side: Secondary | ICD-10-CM

## 2022-07-23 DIAGNOSIS — J3089 Other allergic rhinitis: Secondary | ICD-10-CM

## 2022-07-23 MED ORDER — MONTELUKAST SODIUM 10 MG PO TABS: 10.0000 mg | ORAL_TABLET | Freq: Every day | ORAL | 2 refills | Status: DC

## 2022-07-23 MED ORDER — CLONAZEPAM 1 MG PO TABS: 1.0000 mg | ORAL_TABLET | Freq: Every evening | ORAL | 2 refills | Status: DC | PRN

## 2022-07-23 MED ORDER — METOCLOPRAMIDE HCL 10 MG PO TABS: 10.0000 mg | ORAL_TABLET | Freq: Three times a day (TID) | ORAL | 2 refills | Status: DC

## 2022-07-23 MED ORDER — LOSARTAN POTASSIUM-HCTZ 100-25 MG PO TABS
1.0000 | ORAL_TABLET | Freq: Every day | ORAL | 1 refills | Status: DC
Start: 2022-07-23 — End: 2023-01-10

## 2022-07-23 MED ORDER — DULOXETINE HCL 30 MG PO CPEP
30.0000 mg | ORAL_CAPSULE | Freq: Every day | ORAL | 1 refills | Status: DC
Start: 2022-07-23 — End: 2022-10-18

## 2022-07-23 MED ORDER — ROSUVASTATIN CALCIUM 5 MG PO TABS: 5.0000 mg | ORAL_TABLET | Freq: Every day | ORAL | 1 refills | Status: DC

## 2022-07-23 NOTE — Progress Notes (Signed)
[facility] [place], [place] [address]  Internal MEDICINE  Office Visit Note  Patient Name: Jose Cuevas  [id number]  [id number]  Date of Service: 07/23/2022  Chief Complaint  Patient presents with   Follow-up   Hyperlipidemia   Hypertension    HPI Nihaal presents for a follow up visit for hypertension, high cholesterol and medication refills.  Hypertension -- elevated BP, ran out of medications, needs refills Chronic pain syndrome/chronic back pain -- wants to see pain management, current medications are not controlling his pain adequately.  Requests flu shot Needs med refills    Current Medication: Outpatient Encounter Medications as of 07/23/2022  Medication Sig Note   Adalimumab (HUMIRA PEN) 40 MG/0.4ML PNKT Inject 40 mg into the skin every 14 (fourteen) days.    Aspirin-Caffeine (BC FAST PAIN RELIEF ARTHRITIS PO) Take by mouth.    folic acid (FOLVITE) 1 MG tablet Take 1 mg by mouth daily.    methotrexate 50 MG/2ML injection Inject 50 mg/m2 into the vein once. 0.29ms once every 2 weeks 06/19/2019: Wednesday    mirtazapine (REMERON) 15 MG tablet Take 1 tablet (15 mg total) by mouth at bedtime.    montelukast (SINGULAIR) 10 MG tablet Take 1 tablet (10 mg total) by mouth at bedtime.    Oxycodone HCl 10 MG TABS Take by mouth.    pantoprazole (PROTONIX) 40 MG tablet Take 40 mg by mouth 2 (two) times daily.    tamsulosin (FLOMAX) 0.4 MG CAPS capsule Take 1 capsule (0.4 mg total) by mouth daily.    vitamin B-12 (CYANOCOBALAMIN) 1000 MCG tablet Take 1,000 mcg by mouth daily.    [DISCONTINUED] clonazePAM (KLONOPIN) 1 MG tablet Take 1 mg by mouth at bedtime as needed.    [DISCONTINUED] DULoxetine (CYMBALTA) 30 MG capsule Take 1 capsule (30 mg total) by mouth at bedtime.    [DISCONTINUED] levocetirizine (XYZAL) 5 MG tablet TAKE 1 TABLET BY MOUTH EVERY DAY IN THE EVENING    [DISCONTINUED] losartan-hydrochlorothiazide (HYZAAR) 100-25 MG tablet TAKE 1 TABLET BY  MOUTH EVERY DAY    [DISCONTINUED] metoCLOPramide (REGLAN) 10 MG tablet Take by mouth.    [DISCONTINUED] rosuvastatin (CRESTOR) 5 MG tablet Take 1 tablet (5 mg total) by mouth daily.    clonazePAM (KLONOPIN) 1 MG tablet Take 1 tablet (1 mg total) by mouth at bedtime as needed for anxiety.    DULoxetine (CYMBALTA) 30 MG capsule Take 1 capsule (30 mg total) by mouth at bedtime.    losartan-hydrochlorothiazide (HYZAAR) 100-25 MG tablet Take 1 tablet by mouth daily.    metoCLOPramide (REGLAN) 10 MG tablet Take 1 tablet (10 mg total) by mouth 3 (three) times daily before meals.    rosuvastatin (CRESTOR) 5 MG tablet Take 1 tablet (5 mg total) by mouth daily.    No facility-administered encounter medications on file as of 07/23/2022.    Surgical History: Past Surgical History:  Procedure Laterality Date   BACK SURGERY     COLONOSCOPY WITH PROPOFOL N/A 11/11/2015   Procedure: COLONOSCOPY WITH PROPOFOL;  Surgeon: MJosefine Class MD;  [facility]  Service: Endoscopy;  Laterality: N/A;   COLONOSCOPY WITH PROPOFOL N/A 12/02/2015   Procedure: COLONOSCOPY WITH PROPOFOL;  Surgeon: MJosefine Class MD;  [facility];  Service: Endoscopy;  Laterality: N/A;   COLONOSCOPY WITH PROPOFOL N/A 09/16/2019   Procedure: COLONOSCOPY WITH PROPOFOL;  Surgeon: Toledo, TBenay Pike MD;  Location: [facility] ENDOSCOPY;  Service: Gastroenterology;  Laterality: N/A;   ESOPHAGOGASTRODUODENOSCOPY (EGD) WITH  PROPOFOL N/A 11/11/2015   Procedure: ESOPHAGOGASTRODUODENOSCOPY (EGD) WITH PROPOFOL;  Surgeon: [name], MD;  Location:  [facility] ENDOSCOPY;  Service: Endoscopy;  Laterality: N/A;   ESOPHAGOGASTRODUODENOSCOPY (EGD) WITH PROPOFOL N/A 12/02/2015   Procedure: ESOPHAGOGASTRODUODENOSCOPY (EGD) WITH PROPOFOL;  Surgeon: [name], MD;  Location: [facility] ENDOSCOPY;  Service: Endoscopy;  Laterality: N/A;   ESOPHAGOGASTRODUODENOSCOPY (EGD) WITH PROPOFOL N/A 01/20/2016   Procedure:  ESOPHAGOGASTRODUODENOSCOPY (EGD) WITH PROPOFOL;  Surgeon: [name], MD;  Location[facility] ENDOSCOPY;  Service: Endoscopy;  Laterality: N/A;   ESOPHAGOGASTRODUODENOSCOPY (EGD) WITH PROPOFOL N/A 09/16/2019   Procedure: ESOPHAGOGASTRODUODENOSCOPY (EGD) WITH PROPOFOL;  Surgeon: Toledo, Benay Pike, MD;  Location: [facility] ENDOSCOPY;  Service: Gastroenterology;  Laterality: N/A;   KNEE RECONSTRUCTION Left     Medical History: Past Medical History:  Diagnosis Date   Back pain    Collagen vascular disease ([place])    Rhematoid Arthritis hx.   Constipation, chronic    DDD (degenerative disc disease), cervical    History of duodenal ulcer    History of kidney stones    Hyperlipidemia    Hypertension    Low kidney function    LT Kidney non-functioning   Nondiabetic gastroparesis    Peripheral blood vessel disorder (HCC)    Rheumatoid arthritis with rheumatoid factor (HCC)    Sleep apnea    Weight loss     Family History: Family History  Problem Relation Age of Onset   Diabetes Mother    Diabetes Father    Heart attack Maternal Grandmother    COPD Brother        08/2020   Tourette syndrome Son        in prison 2019   Learning disabilities Son     Social History   Socioeconomic History   Marital status: Married    Spouse name: Not on file   Number of children: Not on file   Years of education: Not on file   Highest education level: Not on file  Occupational History   Not on file  Tobacco Use   Smoking status: Some Days    Types: Cigars   Smokeless tobacco: Never   Tobacco comments:    Occasional  3 a day  Vaping Use   Vaping Use: Never used  Substance and Sexual Activity   Alcohol use: No   Drug use: No   Sexual activity: Yes    Birth control/protection: None  Other Topics Concern   Not on file  Social History Narrative   Not on file   Social Determinants of Health   Financial Resource Strain: Not on file  Food Insecurity: Not on file  Transportation Needs:  Not on file  Physical Activity: Not on file  Stress: Not on file  Social Connections: Not on file  Intimate Partner Violence: Not on file      Review of Systems  Constitutional:  Negative for chills, fatigue and unexpected weight change.  HENT:  Positive for postnasal drip. Negative for congestion, rhinorrhea, sneezing and sore throat.   Eyes:  Negative for redness.  Respiratory:  Negative for cough, chest tightness and shortness of breath.   Cardiovascular:  Negative for chest pain and palpitations.  Gastrointestinal:  Negative for abdominal pain, constipation, diarrhea, nausea and vomiting.  Genitourinary:  Negative for dysuria and frequency.  Musculoskeletal:  Negative for arthralgias, back pain, joint swelling and neck pain.  Skin:  Negative for rash.  Neurological: Negative.  Negative for tremors and numbness.  Hematological:  Negative for  adenopathy. Does not bruise/bleed easily.  Psychiatric/Behavioral:  Negative for behavioral problems (Depression), sleep disturbance and suicidal ideas. The patient is not nervous/anxious.     Vital Signs: BP (!) 180/85 Comment: 183/83  Pulse 75   Temp (!) 97.4 F (36.3 C)   Resp 16   Ht '5\' 7"'$  (1.702 m)   Wt 140 lb 4.8 oz (63.6 kg)   SpO2 98%   BMI 21.97 kg/m    Physical Exam Vitals reviewed.  Constitutional:      General: He is not in acute distress.    Appearance: Normal appearance. He is normal weight. He is not ill-appearing.  HENT:     Head: Normocephalic and atraumatic.  Eyes:     Pupils: Pupils are equal, round, and reactive to light.  Cardiovascular:     Rate and Rhythm: Normal rate and regular rhythm.  Pulmonary:     Effort: Pulmonary effort is normal. No respiratory distress.  Neurological:     Mental Status: He is alert and oriented to person, place, and time.  Psychiatric:        Mood and Affect: Mood normal.        Behavior: Behavior normal.        Assessment/Plan: 1. Essential hypertension Refills  ordered, patient had run out of medication, additional refills ordered  2. Chronic bilateral low back pain with bilateral sciatica Referred to pain management - Ambulatory referral to Pain Clinic  3. Chronic pain syndrome Referred to pain management - Ambulatory referral to Pain Clinic  4. Needs flu shot Administered flu shot at office today - Flu Vaccine MDCK QUAD PF  5. Medication refill - montelukast (SINGULAIR) 10 MG tablet; Take 1 tablet (10 mg total) by mouth at bedtime.  Dispense: 30 tablet; Refill: 2 - losartan-hydrochlorothiazide (HYZAAR) 100-25 MG tablet; Take 1 tablet by mouth daily.  Dispense: 90 tablet; Refill: 1 - DULoxetine (CYMBALTA) 30 MG capsule; Take 1 capsule (30 mg total) by mouth at bedtime.  Dispense: 90 capsule; Refill: 1 - rosuvastatin (CRESTOR) 5 MG tablet; Take 1 tablet (5 mg total) by mouth daily.  Dispense: 90 tablet; Refill: 1 - metoCLOPramide (REGLAN) 10 MG tablet; Take 1 tablet (10 mg total) by mouth 3 (three) times daily before meals.  Dispense: 90 tablet; Refill: 2 - clonazePAM (KLONOPIN) 1 MG tablet; Take 1 tablet (1 mg total) by mouth at bedtime as needed for anxiety.  Dispense: 30 tablet; Refill: 2   General Counseling: Jed verbalizes understanding of the findings of todays visit and agrees with plan of treatment. I have discussed any further diagnostic evaluation that may be needed or ordered today. We also reviewed his medications today. he has been encouraged to call the office with any questions or concerns that should arise related to todays visit.    Orders Placed This Encounter  Procedures   Flu Vaccine MDCK QUAD PF   Ambulatory referral to Pain Clinic    Meds ordered this encounter  Medications   montelukast (SINGULAIR) 10 MG tablet    Sig: Take 1 tablet (10 mg total) by mouth at bedtime.    Dispense:  30 tablet    Refill:  2   losartan-hydrochlorothiazide (HYZAAR) 100-25 MG tablet    Sig: Take 1 tablet by mouth daily.     Dispense:  90 tablet    Refill:  1   DULoxetine (CYMBALTA) 30 MG capsule    Sig: Take 1 capsule (30 mg total) by mouth at bedtime.    Dispense:  90 capsule    Refill:  1    For future refills   rosuvastatin (CRESTOR) 5 MG tablet    Sig: Take 1 tablet (5 mg total) by mouth daily.    Dispense:  90 tablet    Refill:  1    For future refills   metoCLOPramide (REGLAN) 10 MG tablet    Sig: Take 1 tablet (10 mg total) by mouth 3 (three) times daily before meals.    Dispense:  90 tablet    Refill:  2   clonazePAM (KLONOPIN) 1 MG tablet    Sig: Take 1 tablet (1 mg total) by mouth at bedtime as needed for anxiety.    Dispense:  30 tablet    Refill:  2    Return in about 3 months (around 10/23/2022) for F/U, Arlyn Buerkle PCP.   Total time spent:30 Minutes Time spent includes review of chart, medications, test results, and follow up plan with the patient.   [place] Controlled Substance Database was reviewed by me.  This patient was seen by Jonetta Osgood, FNP-C in collaboration with Dr. Clayborn Bigness as a part of collaborative care agreement.   Roby Spalla R. Valetta Fuller, MSN, FNP-C Internal medicine

## 2022-07-24 ENCOUNTER — Telehealth: Payer: Self-pay | Admitting: Nurse Practitioner

## 2022-07-24 NOTE — Telephone Encounter (Signed)
Awaiting 07/23/22 office notes for Pain Management referral-Toni

## 2022-08-06 ENCOUNTER — Other Ambulatory Visit: Payer: Self-pay | Admitting: Nurse Practitioner

## 2022-08-06 DIAGNOSIS — J3089 Other allergic rhinitis: Secondary | ICD-10-CM

## 2022-09-01 ENCOUNTER — Encounter: Payer: Self-pay | Admitting: Nurse Practitioner

## 2022-09-05 ENCOUNTER — Telehealth: Payer: Self-pay | Admitting: Nurse Practitioner

## 2022-09-05 NOTE — Telephone Encounter (Signed)
Pain Mgmt referral sent via [facility] to [facility]

## 2022-09-17 ENCOUNTER — Telehealth: Payer: Self-pay | Admitting: Nurse Practitioner

## 2022-09-17 NOTE — Telephone Encounter (Signed)
Per Rudene Christians , referral has been closed due to patient not returning their calls to schedule appointment-Toni

## 2022-10-16 ENCOUNTER — Other Ambulatory Visit: Payer: Self-pay | Admitting: Nurse Practitioner

## 2022-10-16 DIAGNOSIS — Z760 Encounter for issue of repeat prescription: Secondary | ICD-10-CM

## 2022-10-16 NOTE — Telephone Encounter (Signed)
Patient had appt on 10-18-22. Please refill on his appt day.

## 2022-10-17 ENCOUNTER — Other Ambulatory Visit: Payer: Self-pay | Admitting: Nurse Practitioner

## 2022-10-17 DIAGNOSIS — Z760 Encounter for issue of repeat prescription: Secondary | ICD-10-CM

## 2022-10-17 NOTE — Telephone Encounter (Signed)
Will send at his appt this week

## 2022-10-18 ENCOUNTER — Ambulatory Visit (INDEPENDENT_AMBULATORY_CARE_PROVIDER_SITE_OTHER): Payer: Medicare Other | Admitting: Nurse Practitioner

## 2022-10-18 ENCOUNTER — Encounter: Payer: Self-pay | Admitting: Nurse Practitioner

## 2022-10-18 VITALS — BP 160/80 | HR 79 | Temp 96.9°F | Resp 16 | Ht 67.0 in | Wt 156.0 lb

## 2022-10-18 DIAGNOSIS — Z760 Encounter for issue of repeat prescription: Secondary | ICD-10-CM

## 2022-10-18 DIAGNOSIS — G4709 Other insomnia: Secondary | ICD-10-CM | POA: Diagnosis not present

## 2022-10-18 DIAGNOSIS — M0579 Rheumatoid arthritis with rheumatoid factor of multiple sites without organ or systems involvement: Secondary | ICD-10-CM | POA: Diagnosis not present

## 2022-10-18 DIAGNOSIS — H04203 Unspecified epiphora, bilateral lacrimal glands: Secondary | ICD-10-CM

## 2022-10-18 DIAGNOSIS — F329 Major depressive disorder, single episode, unspecified: Secondary | ICD-10-CM

## 2022-10-18 DIAGNOSIS — I10 Essential (primary) hypertension: Secondary | ICD-10-CM

## 2022-10-18 MED ORDER — DULOXETINE HCL 30 MG PO CPEP: 30.0000 mg | ORAL_CAPSULE | Freq: Every day | ORAL | 1 refills | Status: DC

## 2022-10-18 MED ORDER — MIRTAZAPINE 15 MG PO TABS: 15.0000 mg | ORAL_TABLET | Freq: Every day | ORAL | 2 refills | Status: DC

## 2022-10-18 MED ORDER — CLONAZEPAM 1 MG PO TABS: 1.0000 mg | ORAL_TABLET | Freq: Every evening | ORAL | 2 refills | Status: DC | PRN

## 2022-10-18 NOTE — Progress Notes (Signed)
[facility] [place], [place] [address]  Internal MEDICINE  Office Visit Note  Patient Name: Jose Cuevas  [id number]  [id number]  Date of Service: 10/18/2022  Chief Complaint  Patient presents with   Follow-up   Hyperlipidemia   Hypertension    HPI Jose Cuevas presents for a follow-up visit for insomnia, depression, hypertension and eye pain with watery eyes.  Constantly watery eyes, burning eye pain x6 months, no improvement with montelukast or levocetirizine.  Rheumatoid arthritis -- sees rheumatology and was switched to cimzia from [place].   Clonazepam Hypertension -- did not take medication today, BP is elevated, but reports BP is fine at home when he takes his medication    Current Medication: Outpatient Encounter Medications as of 10/18/2022  Medication Sig Note   Aspirin-Caffeine (BC FAST PAIN RELIEF ARTHRITIS PO) Take by mouth.    certolizumab pegol (CIMZIA) 2 X 200 MG KIT Inject 400 mg into the skin every 30 (thirty) days. Done at rheumatology office.    folic acid (FOLVITE) 1 MG tablet Take 1 mg by mouth daily.    levocetirizine (XYZAL) 5 MG tablet TAKE 1 TABLET BY MOUTH EVERY DAY IN THE EVENING    losartan-hydrochlorothiazide (HYZAAR) 100-25 MG tablet Take 1 tablet by mouth daily.    methotrexate 50 MG/2ML injection Inject 50 mg/m2 into the vein once. 0.3ms once every 2 weeks 06/19/2019: Wednesday    metoCLOPramide (REGLAN) 10 MG tablet Take 1 tablet (10 mg total) by mouth 3 (three) times daily before meals.    Oxycodone HCl 10 MG TABS Take by mouth.    pantoprazole (PROTONIX) 40 MG tablet Take 40 mg by mouth 2 (two) times daily.    rosuvastatin (CRESTOR) 5 MG tablet Take 1 tablet (5 mg total) by mouth daily.    tamsulosin (FLOMAX) 0.4 MG CAPS capsule Take 1 capsule (0.4 mg total) by mouth daily.    vitamin B-12 (CYANOCOBALAMIN) 1000 MCG tablet Take 1,000 mcg by mouth daily.    [DISCONTINUED] Adalimumab (HUMIRA PEN) 40 MG/0.4ML PNKT Inject 40 mg  into the skin every 14 (fourteen) days.    [DISCONTINUED] clonazePAM (KLONOPIN) 1 MG tablet Take 1 tablet (1 mg total) by mouth at bedtime as needed for anxiety.    [DISCONTINUED] DULoxetine (CYMBALTA) 30 MG capsule Take 1 capsule (30 mg total) by mouth at bedtime.    [DISCONTINUED] mirtazapine (REMERON) 15 MG tablet Take 1 tablet (15 mg total) by mouth at bedtime.    [DISCONTINUED] montelukast (SINGULAIR) 10 MG tablet Take 1 tablet (10 mg total) by mouth at bedtime.    clonazePAM (KLONOPIN) 1 MG tablet Take 1 tablet (1 mg total) by mouth at bedtime as needed for anxiety.    DULoxetine (CYMBALTA) 30 MG capsule Take 1 capsule (30 mg total) by mouth at bedtime.    mirtazapine (REMERON) 15 MG tablet Take 1 tablet (15 mg total) by mouth at bedtime.    No facility-administered encounter medications on file as of 10/18/2022.    Surgical History: Past Surgical History:  Procedure Laterality Date   BACK SURGERY     COLONOSCOPY WITH PROPOFOL N/A 11/11/2015   Procedure: COLONOSCOPY WITH PROPOFOL;  Surgeon: MJosefine Class MD;  [facility];  Service: Endoscopy;  Laterality: N/A;   COLONOSCOPY WITH PROPOFOL N/A 12/02/2015   Procedure: COLONOSCOPY WITH PROPOFOL;  Surgeon: MJosefine Class MD;  [facility];  Service: Endoscopy;  Laterality: N/A;   COLONOSCOPY WITH PROPOFOL N/A 09/16/2019   Procedure: COLONOSCOPY WITH  PROPOFOL;  Surgeon: Toledo, Benay Pike, MD;  Location: [facility] ENDOSCOPY;  Service: Gastroenterology;  Laterality: N/A;   ESOPHAGOGASTRODUODENOSCOPY (EGD) WITH PROPOFOL N/A 11/11/2015   Procedure: ESOPHAGOGASTRODUODENOSCOPY (EGD) WITH PROPOFOL;  Surgeon: [name], MD;  Location: [facility] ENDOSCOPY;  Service: Endoscopy;  Laterality: N/A;   ESOPHAGOGASTRODUODENOSCOPY (EGD) WITH PROPOFOL N/A 12/02/2015   Procedure: ESOPHAGOGASTRODUODENOSCOPY (EGD) WITH PROPOFOL;  Surgeon: [name], MD;  Location: [facility] ENDOSCOPY;  Service: Endoscopy;  Laterality: N/A;    ESOPHAGOGASTRODUODENOSCOPY (EGD) WITH PROPOFOL N/A 01/20/2016   Procedure: ESOPHAGOGASTRODUODENOSCOPY (EGD) WITH PROPOFOL;  Surgeon: [name], MD;  Location: [facility] ENDOSCOPY;  Service: Endoscopy;  Laterality: N/A;   ESOPHAGOGASTRODUODENOSCOPY (EGD) WITH PROPOFOL N/A 09/16/2019   Procedure: ESOPHAGOGASTRODUODENOSCOPY (EGD) WITH PROPOFOL;  Surgeon: Toledo, Benay Pike, MD;  Location: [facility] ENDOSCOPY;  Service: Gastroenterology;  Laterality: N/A;   KNEE RECONSTRUCTION Left     Medical History: Past Medical History:  Diagnosis Date   Back pain    Collagen vascular disease ([place])    Rhematoid Arthritis hx.   Constipation, chronic    DDD (degenerative disc disease), cervical    History of duodenal ulcer    History of kidney stones    Hyperlipidemia    Hypertension    Low kidney function    LT Kidney non-functioning   Nondiabetic gastroparesis    Peripheral blood vessel disorder (HCC)    Rheumatoid arthritis with rheumatoid factor (HCC)    Sleep apnea    Weight loss     Family History: Family History  Problem Relation Age of Onset   Diabetes Mother    Diabetes Father    Heart attack Maternal Grandmother    COPD Brother        08/2020   Tourette syndrome Son        in prison 2019   Learning disabilities Son     Social History   Socioeconomic History   Marital status: Married    Spouse name: Not on file   Number of children: Not on file   Years of education: Not on file   Highest education level: Not on file  Occupational History   Not on file  Tobacco Use   Smoking status: Some Days    Types: Cigars   Smokeless tobacco: Never   Tobacco comments:    Occasional  3 a day  Vaping Use   Vaping Use: Never used  Substance and Sexual Activity   Alcohol use: No   Drug use: No   Sexual activity: Yes    Birth control/protection: None  Other Topics Concern   Not on file  Social History Narrative   Not on file   Social Determinants of Health   Financial Resource  Strain: Not on file  Food Insecurity: Not on file  Transportation Needs: Not on file  Physical Activity: Not on file  Stress: Not on file  Social Connections: Not on file  Intimate Partner Violence: Not on file      Review of Systems  Constitutional:  Negative for chills, fatigue and unexpected weight change.  HENT:  Positive for postnasal drip. Negative for congestion, rhinorrhea, sneezing and sore throat.   Eyes:  Positive for pain, discharge, redness and visual disturbance.  Respiratory:  Negative for cough, chest tightness and shortness of breath.   Cardiovascular:  Negative for chest pain and palpitations.  Gastrointestinal:  Negative for abdominal pain, constipation, diarrhea, nausea and vomiting.  Genitourinary:  Negative for dysuria and frequency.  Musculoskeletal:  Negative for arthralgias,  back pain, joint swelling and neck pain.  Skin:  Negative for rash.  Neurological: Negative.  Negative for tremors and numbness.  Hematological:  Negative for adenopathy. Does not bruise/bleed easily.  Psychiatric/Behavioral:  Negative for behavioral problems (Depression), sleep disturbance and suicidal ideas. The patient is not nervous/anxious.     Vital Signs: BP (!) 160/80 Comment: 189/102  Pulse 79   Temp (!) 96.9 F (36.1 C)   Resp 16   Ht '5\' 7"'$  (1.702 m)   Wt 156 lb (70.8 kg)   SpO2 98%   BMI 24.43 kg/m    Physical Exam Vitals reviewed.  Constitutional:      General: He is not in acute distress.    Appearance: Normal appearance. He is normal weight. He is not ill-appearing.  HENT:     Head: Normocephalic and atraumatic.  Eyes:     General: Lids are normal. Vision grossly intact. Gaze aligned appropriately.     Conjunctiva/sclera:     Right eye: Right conjunctiva is injected.     Left eye: Left conjunctiva is injected.     Pupils: Pupils are equal, round, and reactive to light.  Cardiovascular:     Rate and Rhythm: Normal rate and regular rhythm.  Pulmonary:      Effort: Pulmonary effort is normal. No respiratory distress.  Neurological:     Mental Status: He is alert and oriented to person, place, and time.  Psychiatric:        Mood and Affect: Mood normal.        Behavior: Behavior normal.        Assessment/Plan: 1. Watery eyes Referred to ophthalmology for further evaluation - Ambulatory referral to Ophthalmology  2. Essential hypertension Elevated BP today, did not take medication today. Reminded patient of the importance of adherence to medication  3. Rheumatoid arthritis involving multiple sites with positive rheumatoid factor (HCC) Was switched to cimzia. Humira was discontinued. Referred to ophthalmology for eye pain with watery discharge. - certolizumab pegol (CIMZIA) 2 X 200 MG KIT; Inject 400 mg into the skin every 30 (thirty) days. Done at rheumatology office. - Ambulatory referral to Ophthalmology  4. Other insomnia Clonazepam helps with sleep, continue as prescribed. Follow up in 3 months for additional refills - clonazePAM (KLONOPIN) 1 MG tablet; Take 1 tablet (1 mg total) by mouth at bedtime as needed for anxiety.  Dispense: 30 tablet; Refill: 2  5. Major depression, melancholic type Stable, continue duloxetine and mirtazapine as prescribed.  - DULoxetine (CYMBALTA) 30 MG capsule; Take 1 capsule (30 mg total) by mouth at bedtime.  Dispense: 90 capsule; Refill: 1 - mirtazapine (REMERON) 15 MG tablet; Take 1 tablet (15 mg total) by mouth at bedtime.  Dispense: 90 tablet; Refill: 2   General Counseling: Jose Cuevas verbalizes understanding of the findings of todays visit and agrees with plan of treatment. I have discussed any further diagnostic evaluation that may be needed or ordered today. We also reviewed his medications today. he has been encouraged to call the office with any questions or concerns that should arise related to todays visit.    Orders Placed This Encounter  Procedures   Ambulatory referral to Ophthalmology     Meds ordered this encounter  Medications   clonazePAM (KLONOPIN) 1 MG tablet    Sig: Take 1 tablet (1 mg total) by mouth at bedtime as needed for anxiety.    Dispense:  30 tablet    Refill:  2   DULoxetine (CYMBALTA) 30 MG capsule  Sig: Take 1 capsule (30 mg total) by mouth at bedtime.    Dispense:  90 capsule    Refill:  1    For future refills   mirtazapine (REMERON) 15 MG tablet    Sig: Take 1 tablet (15 mg total) by mouth at bedtime.    Dispense:  90 tablet    Refill:  2    Note increased dose and # of tablets. Please discontinue previous orders for this medication.    Return in about 12 weeks (around 01/10/2023) for F/U, anxiety med refill, Rashi Giuliani PCP.   Total time spent:30 Minutes Time spent includes review of chart, medications, test results, and follow up plan with the patient.   [place] Controlled Substance Database was reviewed by me.  This patient was seen by Jonetta Osgood, FNP-C in collaboration with Dr. Clayborn Bigness as a part of collaborative care agreement.   Holland Nickson R. Valetta Fuller, MSN, FNP-C Internal medicine

## 2022-10-19 ENCOUNTER — Telehealth: Payer: Self-pay | Admitting: Nurse Practitioner

## 2022-10-19 NOTE — Telephone Encounter (Signed)
Awaiting 10/18/22 office notes for Ophthalmology referral-Toni

## 2022-10-21 ENCOUNTER — Encounter: Payer: Self-pay | Admitting: Nurse Practitioner

## 2022-10-22 ENCOUNTER — Telehealth: Payer: Self-pay | Admitting: Nurse Practitioner

## 2022-10-22 NOTE — Telephone Encounter (Signed)
Ophthalmology referral sent via [facility] to [place]

## 2022-11-13 ENCOUNTER — Telehealth: Payer: Self-pay | Admitting: Nurse Practitioner

## 2022-11-13 NOTE — Telephone Encounter (Signed)
Per [facility] w/ [facility], referral has been closed due to patient not returning calls to schedule-Toni

## 2023-01-10 ENCOUNTER — Ambulatory Visit (INDEPENDENT_AMBULATORY_CARE_PROVIDER_SITE_OTHER): Payer: Medicare Other | Admitting: Nurse Practitioner

## 2023-01-10 ENCOUNTER — Encounter: Payer: Self-pay | Admitting: Nurse Practitioner

## 2023-01-10 VITALS — BP 146/75 | HR 80 | Temp 97.4°F | Resp 16 | Ht 67.0 in | Wt 152.4 lb

## 2023-01-10 DIAGNOSIS — E538 Deficiency of other specified B group vitamins: Secondary | ICD-10-CM

## 2023-01-10 DIAGNOSIS — G4709 Other insomnia: Secondary | ICD-10-CM | POA: Diagnosis not present

## 2023-01-10 DIAGNOSIS — F329 Major depressive disorder, single episode, unspecified: Secondary | ICD-10-CM

## 2023-01-10 DIAGNOSIS — I700 Atherosclerosis of aorta: Secondary | ICD-10-CM

## 2023-01-10 DIAGNOSIS — J3089 Other allergic rhinitis: Secondary | ICD-10-CM

## 2023-01-10 DIAGNOSIS — Z8709 Personal history of other diseases of the respiratory system: Secondary | ICD-10-CM

## 2023-01-10 DIAGNOSIS — R053 Chronic cough: Secondary | ICD-10-CM | POA: Diagnosis not present

## 2023-01-10 DIAGNOSIS — E782 Mixed hyperlipidemia: Secondary | ICD-10-CM

## 2023-01-10 DIAGNOSIS — R5382 Chronic fatigue, unspecified: Secondary | ICD-10-CM

## 2023-01-10 DIAGNOSIS — N261 Atrophy of kidney (terminal): Secondary | ICD-10-CM | POA: Diagnosis not present

## 2023-01-10 DIAGNOSIS — R0602 Shortness of breath: Secondary | ICD-10-CM

## 2023-01-10 DIAGNOSIS — Z79899 Other long term (current) drug therapy: Secondary | ICD-10-CM

## 2023-01-10 DIAGNOSIS — R0789 Other chest pain: Secondary | ICD-10-CM

## 2023-01-10 DIAGNOSIS — N200 Calculus of kidney: Secondary | ICD-10-CM

## 2023-01-10 DIAGNOSIS — R5381 Other malaise: Secondary | ICD-10-CM

## 2023-01-10 DIAGNOSIS — E870 Hyperosmolality and hypernatremia: Secondary | ICD-10-CM

## 2023-01-10 DIAGNOSIS — E559 Vitamin D deficiency, unspecified: Secondary | ICD-10-CM

## 2023-01-10 DIAGNOSIS — Z760 Encounter for issue of repeat prescription: Secondary | ICD-10-CM

## 2023-01-10 MED ORDER — CLONAZEPAM 1 MG PO TABS
1.0000 mg | ORAL_TABLET | Freq: Every evening | ORAL | 2 refills | Status: DC | PRN
Start: 2023-01-10 — End: 2023-04-11

## 2023-01-10 MED ORDER — ROSUVASTATIN CALCIUM 5 MG PO TABS
5.0000 mg | ORAL_TABLET | Freq: Every day | ORAL | 1 refills | Status: DC
Start: 2023-01-10 — End: 2023-09-23

## 2023-01-10 MED ORDER — PANTOPRAZOLE SODIUM 40 MG PO TBEC
40.0000 mg | DELAYED_RELEASE_TABLET | Freq: Two times a day (BID) | ORAL | 1 refills | Status: DC
Start: 2023-01-10 — End: 2023-07-11

## 2023-01-10 MED ORDER — LEVOCETIRIZINE DIHYDROCHLORIDE 5 MG PO TABS: ORAL_TABLET | ORAL | 1 refills | Status: DC

## 2023-01-10 MED ORDER — METOCLOPRAMIDE HCL 10 MG PO TABS
10.0000 mg | ORAL_TABLET | Freq: Three times a day (TID) | ORAL | 2 refills | Status: DC
Start: 2023-01-10 — End: 2023-07-11

## 2023-01-10 MED ORDER — LOSARTAN POTASSIUM-HCTZ 100-25 MG PO TABS
1.0000 | ORAL_TABLET | Freq: Every day | ORAL | 1 refills | Status: DC
Start: 2023-01-10 — End: 2023-07-11

## 2023-01-10 MED ORDER — MIRTAZAPINE 15 MG PO TABS
15.0000 mg | ORAL_TABLET | Freq: Every day | ORAL | 2 refills | Status: DC
Start: 2023-01-10 — End: 2023-07-11

## 2023-01-10 MED ORDER — TAMSULOSIN HCL 0.4 MG PO CAPS: 0.4000 mg | ORAL_CAPSULE | Freq: Every day | ORAL | 3 refills | Status: DC

## 2023-01-10 NOTE — Progress Notes (Signed)
[facility] [address] [place], [place] [address]  Internal MEDICINE  Office Visit Note  Patient Name: Jose Cuevas  [id number]  [id number]  Date of Service: 01/10/2023  Chief Complaint  Patient presents with   Follow-up    HPI Athen presents for a follow-up visit for insomnia/anxiety, chronic cough, chronic fatigue, routine labs  --anxiety/insomnia-- prn clonazepam due for refills.  Due for labs --routine  Feels tired and low energy all the time, on new med from rheum cimzia,  Due for several medication refills.  --has chronic cough, SOB, chest tightness, and history of pleural effusion. No recent chest xray    Current Medication: Outpatient Encounter Medications as of 01/10/2023  Medication Sig Note   Aspirin-Caffeine (BC FAST PAIN RELIEF ARTHRITIS PO) Take by mouth.    certolizumab pegol (CIMZIA) 2 X 200 MG KIT Inject 400 mg into the skin every 30 (thirty) days. Done at rheumatology office.    DULoxetine (CYMBALTA) 30 MG capsule Take 1 capsule (30 mg total) by mouth at bedtime.    folic acid (FOLVITE) 1 MG tablet Take 1 mg by mouth daily.    methotrexate 50 MG/2ML injection Inject 50 mg/m2 into the vein once. 0.42mls once every 2 weeks 06/19/2019: Wednesday    Oxycodone HCl 10 MG TABS Take by mouth.    vitamin B-12 (CYANOCOBALAMIN) 1000 MCG tablet Take 1,000 mcg by mouth daily.    [DISCONTINUED] clonazePAM (KLONOPIN) 1 MG tablet Take 1 tablet (1 mg total) by mouth at bedtime as needed for anxiety.    [DISCONTINUED] levocetirizine (XYZAL) 5 MG tablet TAKE 1 TABLET BY MOUTH EVERY DAY IN THE EVENING    [DISCONTINUED] losartan-hydrochlorothiazide (HYZAAR) 100-25 MG tablet Take 1 tablet by mouth daily.    [DISCONTINUED] metoCLOPramide (REGLAN) 10 MG tablet Take 1 tablet (10 mg total) by mouth 3 (three) times daily before meals.    [DISCONTINUED] mirtazapine (REMERON) 15 MG tablet Take 1 tablet (15 mg total) by mouth at bedtime.    [DISCONTINUED] pantoprazole (PROTONIX)  40 MG tablet Take 40 mg by mouth 2 (two) times daily.    [DISCONTINUED] rosuvastatin (CRESTOR) 5 MG tablet Take 1 tablet (5 mg total) by mouth daily.    [DISCONTINUED] tamsulosin (FLOMAX) 0.4 MG CAPS capsule Take 1 capsule (0.4 mg total) by mouth daily.    clonazePAM (KLONOPIN) 1 MG tablet Take 1 tablet (1 mg total) by mouth at bedtime as needed for anxiety.    levocetirizine (XYZAL) 5 MG tablet TAKE 1 TABLET BY MOUTH EVERY DAY IN THE EVENING    losartan-hydrochlorothiazide (HYZAAR) 100-25 MG tablet Take 1 tablet by mouth daily.    metoCLOPramide (REGLAN) 10 MG tablet Take 1 tablet (10 mg total) by mouth 3 (three) times daily before meals.    mirtazapine (REMERON) 15 MG tablet Take 1 tablet (15 mg total) by mouth at bedtime.    pantoprazole (PROTONIX) 40 MG tablet Take 1 tablet (40 mg total) by mouth 2 (two) times daily.    rosuvastatin (CRESTOR) 5 MG tablet Take 1 tablet (5 mg total) by mouth daily.    tamsulosin (FLOMAX) 0.4 MG CAPS capsule Take 1 capsule (0.4 mg total) by mouth daily.    No facility-administered encounter medications on file as of 01/10/2023.    Surgical History: Past Surgical History:  Procedure Laterality Date   BACK SURGERY     COLONOSCOPY WITH PROPOFOL N/A 11/11/2015   Procedure: COLONOSCOPY WITH PROPOFOL;  Surgeon: Elnita Maxwell, MD;  Location: [facility] ENDOSCOPY;  Service:  Endoscopy;  Laterality: N/A;   COLONOSCOPY WITH PROPOFOL N/A 12/02/2015   Procedure: COLONOSCOPY WITH PROPOFOL;  Surgeon: Elnita Maxwell, MD;  Location: [facility] ENDOSCOPY;  Service: Endoscopy;  Laterality: N/A;   COLONOSCOPY WITH PROPOFOL N/A 09/16/2019   Procedure: COLONOSCOPY WITH PROPOFOL;  Surgeon: Toledo, Boykin Nearing, MD;  Location: [facility] ENDOSCOPY;  Service: Gastroenterology;  Laterality: N/A;   ESOPHAGOGASTRODUODENOSCOPY (EGD) WITH PROPOFOL N/A 11/11/2015   Procedure: ESOPHAGOGASTRODUODENOSCOPY (EGD) WITH PROPOFOL;  Surgeon: Elnita Maxwell, MD;  Location: [facility] ENDOSCOPY;  Service:  Endoscopy;  Laterality: N/A;   ESOPHAGOGASTRODUODENOSCOPY (EGD) WITH PROPOFOL N/A 12/02/2015   Procedure: ESOPHAGOGASTRODUODENOSCOPY (EGD) WITH PROPOFOL;  Surgeon: Elnita Maxwell, MD;  Location: [facility] ENDOSCOPY;  Service: Endoscopy;  Laterality: N/A;   ESOPHAGOGASTRODUODENOSCOPY (EGD) WITH PROPOFOL N/A 01/20/2016   Procedure: ESOPHAGOGASTRODUODENOSCOPY (EGD) WITH PROPOFOL;  Surgeon: Elnita Maxwell, MD;  Location: [facility] ENDOSCOPY;  Service: Endoscopy;  Laterality: N/A;   ESOPHAGOGASTRODUODENOSCOPY (EGD) WITH PROPOFOL N/A 09/16/2019   Procedure: ESOPHAGOGASTRODUODENOSCOPY (EGD) WITH PROPOFOL;  Surgeon: Toledo, Boykin Nearing, MD;  Location: [facility] ENDOSCOPY;  Service: Gastroenterology;  Laterality: N/A;   KNEE RECONSTRUCTION Left     Medical History: Past Medical History:  Diagnosis Date   Back pain    Collagen vascular disease    Rhematoid Arthritis hx.   Constipation, chronic    DDD (degenerative disc disease), cervical    History of duodenal ulcer    History of kidney stones    Hyperlipidemia    Hypertension    Low kidney function    LT Kidney non-functioning   Nondiabetic gastroparesis    Peripheral blood vessel disorder    Rheumatoid arthritis with rheumatoid factor    Sleep apnea    Weight loss     Family History: Family History  Problem Relation Age of Onset   Diabetes Mother    Diabetes Father    Heart attack Maternal Grandmother    COPD Brother        08/2020   Tourette syndrome Son        in prison 2019   Learning disabilities Son     Social History   Socioeconomic History   Marital status: Married    Spouse name: Not on file   Number of children: Not on file   Years of education: Not on file   Highest education level: Not on file  Occupational History   Not on file  Tobacco Use   Smoking status: Some Days    Types: Cigars   Smokeless tobacco: Never   Tobacco comments:    Occasional  3 a day  Vaping Use   Vaping Use: Never used  Substance and Sexual  Activity   Alcohol use: No   Drug use: No   Sexual activity: Yes    Birth control/protection: None  Other Topics Concern   Not on file  Social History Narrative   Not on file   Social Determinants of Health   Financial Resource Strain: Not on file  Food Insecurity: Not on file  Transportation Needs: Not on file  Physical Activity: Not on file  Stress: Not on file  Social Connections: Not on file  Intimate Partner Violence: Not on file      Review of Systems  Constitutional:  Positive for fatigue. Negative for chills and unexpected weight change.  HENT:  Positive for postnasal drip. Negative for congestion, rhinorrhea, sneezing and sore throat.   Eyes:  Positive for pain, discharge, redness and visual disturbance.  Respiratory:  Positive  for cough, chest tightness and shortness of breath. Negative for wheezing.   Cardiovascular: Negative.  Negative for chest pain and palpitations.  Gastrointestinal: Negative.  Negative for abdominal pain, constipation, diarrhea, nausea and vomiting.  Musculoskeletal:  Negative for arthralgias, back pain, joint swelling and neck pain.  Skin:  Negative for rash.  Neurological: Negative.  Negative for tremors and numbness.  Hematological:  Negative for adenopathy. Does not bruise/bleed easily.  Psychiatric/Behavioral:  Positive for sleep disturbance. Negative for behavioral problems (Depression), self-injury and suicidal ideas. The patient is nervous/anxious.     Vital Signs: BP (!) 146/75   Pulse 80   Temp (!) 97.4 F (36.3 C)   Resp 16   Ht  (1.702 m)   Wt 152 lb 6.4 oz (69.1 kg)   SpO2 98%   BMI 23.87 kg/m    Physical Exam Vitals reviewed.  Constitutional:      General: He is not in acute distress.    Appearance: Normal appearance. He is normal weight. He is ill-appearing.  HENT:     Head: Normocephalic and atraumatic.  Eyes:     Pupils: Pupils are equal, round, and reactive to light.  Cardiovascular:     Rate and  Rhythm: Normal rate and regular rhythm.     Heart sounds: Normal heart sounds. No murmur heard. Pulmonary:     Effort: Pulmonary effort is normal. No respiratory distress.     Breath sounds: Normal breath sounds. No wheezing.  Neurological:     Mental Status: He is alert and oriented to person, place, and time.  Psychiatric:        Mood and Affect: Mood normal.        Behavior: Behavior normal.        Assessment/Plan: 1. Chronic fatigue and malaise Routine labs ordered for further evaluation - Iron, TIBC and Ferritin Panel - B12 and Folate Panel - Vitamin D (25 hydroxy) - Lipid Profile - TSH + free T4 - BMP8+1AC - CBC with Differential/Platelet  2. Chronic cough Chest xray ordered - DG Chest 2 View; Future  3. Atrophic kidney, acquired Routine labs ordered - BMP8+1AC - CBC with Differential/Platelet  4. Other insomnia Continue clonazepam as prescribed.  - clonazePAM (KLONOPIN) 1 MG tablet; Take 1 tablet (1 mg total) by mouth at bedtime as needed for anxiety.  Dispense: 30 tablet; Refill: 2  5. B12 deficiency Routine labs ordered - Iron, TIBC and Ferritin Panel - B12 and Folate Panel - CBC with Differential/Platelet  6. Vitamin D deficiency Routine labs ordered - Vitamin D (25 hydroxy)  7. Aortic atherosclerosis Routine labs ordered - Lipid Profile - CBC with Differential/Platelet  8. History of pleural effusion Chest xray ordered - DG Chest 2 View; Future  9. Encounter for medication review Medication list reviewed, medication refills ordered - clonazePAM (KLONOPIN) 1 MG tablet; Take 1 tablet (1 mg total) by mouth at bedtime as needed for anxiety.  Dispense: 30 tablet; Refill: 2 - levocetirizine (XYZAL) 5 MG tablet; TAKE 1 TABLET BY MOUTH EVERY DAY IN THE EVENING  Dispense: 90 tablet; Refill: 1 - losartan-hydrochlorothiazide (HYZAAR) 100-25 MG tablet; Take 1 tablet by mouth daily.  Dispense: 90 tablet; Refill: 1 - tamsulosin (FLOMAX) 0.4 MG CAPS  capsule; Take 1 capsule (0.4 mg total) by mouth daily.  Dispense: 90 capsule; Refill: 3 - rosuvastatin (CRESTOR) 5 MG tablet; Take 1 tablet (5 mg total) by mouth daily.  Dispense: 90 tablet; Refill: 1 - pantoprazole (PROTONIX) 40 MG tablet; Take 1  tablet (40 mg total) by mouth 2 (two) times daily.  Dispense: 90 tablet; Refill: 1 - mirtazapine (REMERON) 15 MG tablet; Take 1 tablet (15 mg total) by mouth at bedtime.  Dispense: 90 tablet; Refill: 2 - metoCLOPramide (REGLAN) 10 MG tablet; Take 1 tablet (10 mg total) by mouth 3 (three) times daily before meals.  Dispense: 90 tablet; Refill: 2   General Counseling: Silas verbalizes understanding of the findings of todays visit and agrees with plan of treatment. I have discussed any further diagnostic evaluation that may be needed or ordered today. We also reviewed his medications today. he has been encouraged to call the office with any questions or concerns that should arise related to todays visit.    Orders Placed This Encounter  Procedures   DG Chest 2 View   Iron, TIBC and Ferritin Panel   B12 and Folate Panel   Vitamin D (25 hydroxy)   Lipid Profile   TSH + free T4   BMP8+1AC   CBC with Differential/Platelet    Meds ordered this encounter  Medications   clonazePAM (KLONOPIN) 1 MG tablet    Sig: Take 1 tablet (1 mg total) by mouth at bedtime as needed for anxiety.    Dispense:  30 tablet    Refill:  2   levocetirizine (XYZAL) 5 MG tablet    Sig: TAKE 1 TABLET BY MOUTH EVERY DAY IN THE EVENING    Dispense:  90 tablet    Refill:  1   losartan-hydrochlorothiazide (HYZAAR) 100-25 MG tablet    Sig: Take 1 tablet by mouth daily.    Dispense:  90 tablet    Refill:  1   tamsulosin (FLOMAX) 0.4 MG CAPS capsule    Sig: Take 1 capsule (0.4 mg total) by mouth daily.    Dispense:  90 capsule    Refill:  3   rosuvastatin (CRESTOR) 5 MG tablet    Sig: Take 1 tablet (5 mg total) by mouth daily.    Dispense:  90 tablet    Refill:  1    For  future refills   pantoprazole (PROTONIX) 40 MG tablet    Sig: Take 1 tablet (40 mg total) by mouth 2 (two) times daily.    Dispense:  90 tablet    Refill:  1   mirtazapine (REMERON) 15 MG tablet    Sig: Take 1 tablet (15 mg total) by mouth at bedtime.    Dispense:  90 tablet    Refill:  2    Note increased dose and # of tablets. Please discontinue previous orders for this medication.   metoCLOPramide (REGLAN) 10 MG tablet    Sig: Take 1 tablet (10 mg total) by mouth 3 (three) times daily before meals.    Dispense:  90 tablet    Refill:  2    Return in about 3 months (around 04/03/2023) for F/U, anxiety med refill, Fleetwood Pierron PCP.   Total time spent:30 Minutes Time spent includes review of chart, medications, test results, and follow up plan with the patient.   [place] Controlled Substance Database was reviewed by me.  This patient was seen by Sallyanne Kuster, FNP-C in collaboration with Dr. Beverely Risen as a part of collaborative care agreement.   Nubia Ziesmer R. Tedd Sias, MSN, FNP-C Internal medicine

## 2023-01-19 ENCOUNTER — Encounter: Payer: Self-pay | Admitting: Nurse Practitioner

## 2023-03-26 ENCOUNTER — Ambulatory Visit
Admission: RE | Admit: 2023-03-26 | Discharge: 2023-03-26 | Disposition: A | Discharge status: Home / Self Care | Payer: Medicare Other | Source: Ambulatory Visit | Attending: Nurse Practitioner | Admitting: Nurse Practitioner

## 2023-03-26 ENCOUNTER — Ambulatory Visit
Admission: RE | Admit: 2023-03-26 | Discharge: 2023-03-26 | Disposition: A | Discharge status: Home / Self Care | Payer: Medicare Other | Attending: Nurse Practitioner | Admitting: Nurse Practitioner

## 2023-03-26 DIAGNOSIS — R053 Chronic cough: Secondary | ICD-10-CM | POA: Diagnosis present

## 2023-03-26 DIAGNOSIS — Z8709 Personal history of other diseases of the respiratory system: Secondary | ICD-10-CM | POA: Insufficient documentation

## 2023-04-02 ENCOUNTER — Other Ambulatory Visit: Payer: Self-pay | Admitting: Nurse Practitioner

## 2023-04-02 ENCOUNTER — Telehealth: Payer: Self-pay

## 2023-04-02 DIAGNOSIS — E876 Hypokalemia: Secondary | ICD-10-CM

## 2023-04-02 DIAGNOSIS — J90 Pleural effusion, not elsewhere classified: Secondary | ICD-10-CM

## 2023-04-02 MED ORDER — FUROSEMIDE 20 MG PO TABS: 20.0000 mg | ORAL_TABLET | Freq: Every day | ORAL | 0 refills | Status: DC

## 2023-04-02 NOTE — Telephone Encounter (Signed)
Sent patient a MyChart message

## 2023-04-02 NOTE — Progress Notes (Signed)
Chest xray shows a small pleural effusion on the left and a moderate pleural effusion on the right side as well as pulmonary vascular congestion. I have sent a prescription for furosemide 20 mg daily x5 days. This should help to pull the fluid off the lungs decreasing his cough and SOB. After those 5 days, he needs to get his lab drawn, I ordered a BMP. Then we need to follow up with an office visit late next week.

## 2023-04-09 ENCOUNTER — Other Ambulatory Visit
Admission: RE | Admit: 2023-04-09 | Discharge: 2023-04-09 | Disposition: A | Discharge status: Home / Self Care | Payer: Medicare Other | Attending: Nurse Practitioner | Admitting: Nurse Practitioner

## 2023-04-09 DIAGNOSIS — J90 Pleural effusion, not elsewhere classified: Secondary | ICD-10-CM | POA: Insufficient documentation

## 2023-04-09 DIAGNOSIS — E876 Hypokalemia: Secondary | ICD-10-CM | POA: Insufficient documentation

## 2023-04-09 LAB — BASIC METABOLIC PANEL
Anion gap: 9 (ref 5–15)
BUN: 11 mg/dL (ref 8–23)
CO2: 21 mmol/L — ABNORMAL LOW (ref 22–32)
Calcium: 8.4 mg/dL — ABNORMAL LOW (ref 8.9–10.3)
Chloride: 109 mmol/L (ref 98–111)
Creatinine, Ser: 0.98 mg/dL (ref 0.61–1.24)
GFR, Estimated: >60 mL/min (ref >60)
Glucose, Bld: 81 mg/dL (ref 70–99)
Potassium: 3.6 mmol/L (ref 3.5–5.1)
Sodium: 139 mmol/L (ref 135–145)

## 2023-04-11 ENCOUNTER — Encounter: Payer: Self-pay | Admitting: Nurse Practitioner

## 2023-04-11 ENCOUNTER — Ambulatory Visit (INDEPENDENT_AMBULATORY_CARE_PROVIDER_SITE_OTHER): Payer: Medicare Other | Admitting: Nurse Practitioner

## 2023-04-11 VITALS — BP 134/80 | HR 65 | Temp 98.1°F | Resp 16 | Ht 67.0 in | Wt 145.0 lb

## 2023-04-11 DIAGNOSIS — I10 Essential (primary) hypertension: Secondary | ICD-10-CM | POA: Diagnosis not present

## 2023-04-11 DIAGNOSIS — J90 Pleural effusion, not elsewhere classified: Secondary | ICD-10-CM | POA: Diagnosis not present

## 2023-04-11 DIAGNOSIS — G4709 Other insomnia: Secondary | ICD-10-CM

## 2023-04-11 MED ORDER — CLONAZEPAM 1 MG PO TABS
1.0000 mg | ORAL_TABLET | Freq: Every evening | ORAL | 2 refills | Status: DC | PRN
Start: 2023-04-11 — End: 2023-07-11

## 2023-04-11 NOTE — Progress Notes (Signed)
[facility] [address] [place], [place] [address]  Internal MEDICINE  Office Visit Note  Patient Name: Jose Cuevas  [id number]  [id number]  Date of Service: 04/11/2023  Chief Complaint  Patient presents with   Hyperlipidemia   Hypertension   Follow-up    HPI Jose Cuevas presents for a follow-up visit for chest xray result, labs and refills.  Hypocalcemia -- low at 8.4, patient had stopped taking his calcium supplement.  Bilateral pleural effusion -- R>L -- has finished the short course of furosemide and had his labs drawn sodium level is fine.  Anxiety/insomnia -- due for refills of clonazepam, no change, current dose is effective.     Current Medication: Outpatient Encounter Medications as of 04/11/2023  Medication Sig Note   Aspirin-Caffeine (BC FAST PAIN RELIEF ARTHRITIS PO) Take by mouth.    certolizumab pegol (CIMZIA) 2 X 200 MG KIT Inject 400 mg into the skin every 30 (thirty) days. Done at rheumatology office.    DULoxetine (CYMBALTA) 30 MG capsule Take 1 capsule (30 mg total) by mouth at bedtime.    folic acid (FOLVITE) 1 MG tablet Take 1 mg by mouth daily.    levocetirizine (XYZAL) 5 MG tablet TAKE 1 TABLET BY MOUTH EVERY DAY IN THE EVENING    losartan-hydrochlorothiazide (HYZAAR) 100-25 MG tablet Take 1 tablet by mouth daily.    methotrexate 50 MG/2ML injection Inject 50 mg/m2 into the vein once. 0.53mls once every 2 weeks 06/19/2019: Wednesday    metoCLOPramide (REGLAN) 10 MG tablet Take 1 tablet (10 mg total) by mouth 3 (three) times daily before meals.    mirtazapine (REMERON) 15 MG tablet Take 1 tablet (15 mg total) by mouth at bedtime.    Oxycodone HCl 10 MG TABS Take by mouth.    pantoprazole (PROTONIX) 40 MG tablet Take 1 tablet (40 mg total) by mouth 2 (two) times daily.    rosuvastatin (CRESTOR) 5 MG tablet Take 1 tablet (5 mg total) by mouth daily.    tamsulosin (FLOMAX) 0.4 MG CAPS capsule Take 1 capsule (0.4 mg total) by mouth daily.    vitamin B-12  (CYANOCOBALAMIN) 1000 MCG tablet Take 1,000 mcg by mouth daily.    [DISCONTINUED] clonazePAM (KLONOPIN) 1 MG tablet Take 1 tablet (1 mg total) by mouth at bedtime as needed for anxiety.    clonazePAM (KLONOPIN) 1 MG tablet Take 1 tablet (1 mg total) by mouth at bedtime as needed for anxiety.    furosemide (LASIX) 20 MG tablet Take 1 tablet (20 mg total) by mouth daily for 5 days.    No facility-administered encounter medications on file as of 04/11/2023.    Surgical History: Past Surgical History:  Procedure Laterality Date   BACK SURGERY     COLONOSCOPY WITH PROPOFOL N/A 11/11/2015   Procedure: COLONOSCOPY WITH PROPOFOL;  Surgeon: Elnita Maxwell, MD;  Location[facility] ENDOSCOPY;  Service: Endoscopy;  Laterality: N/A;   COLONOSCOPY WITH PROPOFOL N/A 12/02/2015   Procedure: COLONOSCOPY WITH PROPOFOL;  Surgeon: Elnita Maxwell, MD;  Location: [facility] ENDOSCOPY;  Service: Endoscopy;  Laterality: N/A;   COLONOSCOPY WITH PROPOFOL N/A 09/16/2019   Procedure: COLONOSCOPY WITH PROPOFOL;  Surgeon: Toledo, Boykin Nearing, MD;  Location: [facility] ENDOSCOPY;  Service: Gastroenterology;  Laterality: N/A;   ESOPHAGOGASTRODUODENOSCOPY (EGD) WITH PROPOFOL N/A 11/11/2015   Procedure: ESOPHAGOGASTRODUODENOSCOPY (EGD) WITH PROPOFOL;  Surgeon: Elnita Maxwell, MD;  Location: [facility] ENDOSCOPY;  Service: Endoscopy;  Laterality: N/A;   ESOPHAGOGASTRODUODENOSCOPY (EGD) WITH PROPOFOL N/A 12/02/2015   Procedure:  ESOPHAGOGASTRODUODENOSCOPY (EGD) WITH PROPOFOL;  Surgeon: Elnita Maxwell, MD;  Location: [facility] ENDOSCOPY;  Service: Endoscopy;  Laterality: N/A;   ESOPHAGOGASTRODUODENOSCOPY (EGD) WITH PROPOFOL N/A 01/20/2016   Procedure: ESOPHAGOGASTRODUODENOSCOPY (EGD) WITH PROPOFOL;  Surgeon: Elnita Maxwell, MD;  Location: [facility] ENDOSCOPY;  Service: Endoscopy;  Laterality: N/A;   ESOPHAGOGASTRODUODENOSCOPY (EGD) WITH PROPOFOL N/A 09/16/2019   Procedure: ESOPHAGOGASTRODUODENOSCOPY (EGD) WITH PROPOFOL;  Surgeon: Toledo, Boykin Nearing, MD;  Location: [facility] ENDOSCOPY;  Service: Gastroenterology;  Laterality: N/A;   KNEE RECONSTRUCTION Left     Medical History: Past Medical History:  Diagnosis Date   Back pain    Collagen vascular disease (HCC)    Rhematoid Arthritis hx.   Constipation, chronic    DDD (degenerative disc disease), cervical    History of duodenal ulcer    History of kidney stones    Hyperlipidemia    Hypertension    Low kidney function    LT Kidney non-functioning   Nondiabetic gastroparesis    Peripheral blood vessel disorder (HCC)    Rheumatoid arthritis with rheumatoid factor (HCC)    Sleep apnea    Weight loss     Family History: Family History  Problem Relation Age of Onset   Diabetes Mother    Diabetes Father    Heart attack Maternal Grandmother    COPD Brother        08/2020   Tourette syndrome Son        in prison 2019   Learning disabilities Son     Social History   Socioeconomic History   Marital status: Married    Spouse name: Not on file   Number of children: Not on file   Years of education: Not on file   Highest education level: Not on file  Occupational History   Not on file  Tobacco Use   Smoking status: Some Days    Types: Cigars   Smokeless tobacco: Never   Tobacco comments:    Occasional  3 a day  Vaping Use   Vaping status: Never Used  Substance and Sexual Activity   Alcohol use: No   Drug use: No   Sexual activity: Yes    Birth control/protection: None  Other Topics Concern   Not on file  Social History Narrative   Not on file   Social Determinants of Health   Financial Resource Strain: Not on file  Food Insecurity: Not on file  Transportation Needs: Not on file  Physical Activity: Not on file  Stress: Not on file  Social Connections: Not on file  Intimate Partner Violence: Not on file      Review of Systems  Constitutional:  Positive for fatigue. Negative for chills and unexpected weight change.  HENT:  Positive for postnasal drip.  Negative for congestion, rhinorrhea, sneezing and sore throat.   Eyes:  Positive for pain, discharge, redness and visual disturbance.  Respiratory:  Positive for cough, chest tightness and shortness of breath. Negative for wheezing.   Cardiovascular: Negative.  Negative for chest pain and palpitations.  Gastrointestinal: Negative.  Negative for abdominal pain, constipation, diarrhea, nausea and vomiting.  Musculoskeletal:  Negative for arthralgias, back pain, joint swelling and neck pain.  Skin:  Negative for rash.  Neurological: Negative.  Negative for tremors and numbness.  Hematological:  Negative for adenopathy. Does not bruise/bleed easily.  Psychiatric/Behavioral:  Positive for sleep disturbance. Negative for behavioral problems (Depression), self-injury and suicidal ideas. The patient is nervous/anxious.     Vital Signs: BP 134/80  Comment: 160/82  Pulse 65   Temp 98.1 F (36.7 C)   Resp 16   Ht 5\' 7"  (1.702 m)   Wt 145 lb (65.8 kg)   SpO2 97%   BMI 22.71 kg/m    Physical Exam Vitals reviewed.  Constitutional:      General: He is not in acute distress.    Appearance: Normal appearance. He is normal weight. He is ill-appearing.  HENT:     Head: Normocephalic and atraumatic.  Eyes:     Pupils: Pupils are equal, round, and reactive to light.  Cardiovascular:     Rate and Rhythm: Normal rate and regular rhythm.     Heart sounds: Normal heart sounds. No murmur heard. Pulmonary:     Effort: Pulmonary effort is normal. No respiratory distress.     Breath sounds: Normal breath sounds. No wheezing.  Neurological:     Mental Status: He is alert and oriented to person, place, and time.  Psychiatric:        Mood and Affect: Mood normal.        Behavior: Behavior normal.        Assessment/Plan: 1. Bilateral pleural effusion CT chest ordered with contrast due to pleural effusion and pulmonary vascular congestion. Follow up in office to discuss results.  - CT Chest W  Contrast; Future  2. Hypocalcemia May restart calcium supplement OTC  3. Essential hypertension Stable, continue losartan-hydrochlorothiazide  as prescribed.   4. Other insomnia Continue clonazepam prn as prescribed. Refills x3 months ordered, follow up in 3 months for additional refills. - clonazePAM (KLONOPIN) 1 MG tablet; Take 1 tablet (1 mg total) by mouth at bedtime as needed for anxiety.  Dispense: 30 tablet; Refill: 2   General Counseling: Jose Cuevas verbalizes understanding of the findings of todays visit and agrees with plan of treatment. I have discussed any further diagnostic evaluation that may be needed or ordered today. We also reviewed his medications today. he has been encouraged to call the office with any questions or concerns that should arise related to todays visit.    Orders Placed This Encounter  Procedures   CT Chest W Contrast    Meds ordered this encounter  Medications   clonazePAM (KLONOPIN) 1 MG tablet    Sig: Take 1 tablet (1 mg total) by mouth at bedtime as needed for anxiety.    Dispense:  30 tablet    Refill:  2    Return for F/U, Bethsaida Siegenthaler PCP to discuss CT chest results. f/u in 3 mon for med refills..   Total time spent:30 Minutes Time spent includes review of chart, medications, test results, and follow up plan with the patient.   [place] Controlled Substance Database was reviewed by me.  This patient was seen by Sallyanne Kuster, FNP-C in collaboration with Dr. Beverely Risen as a part of collaborative care agreement.   Jose Cuevas R. Tedd Sias, MSN, FNP-C Internal medicine

## 2023-04-15 ENCOUNTER — Ambulatory Visit
Admission: RE | Admit: 2023-04-15 | Discharge: 2023-04-15 | Disposition: A | Discharge status: Home / Self Care | Payer: Medicare Other | Source: Ambulatory Visit | Attending: Nurse Practitioner | Admitting: Nurse Practitioner

## 2023-04-15 DIAGNOSIS — J90 Pleural effusion, not elsewhere classified: Secondary | ICD-10-CM | POA: Insufficient documentation

## 2023-04-15 MED ORDER — IOHEXOL 300 MG/ML  SOLN
75.0000 mL | Freq: Once | INTRAMUSCULAR | Status: AC | PRN
  Administered 2023-04-15: 75 mL via INTRAVENOUS

## 2023-04-23 ENCOUNTER — Encounter: Payer: Self-pay | Admitting: Internal Medicine

## 2023-04-23 ENCOUNTER — Ambulatory Visit (INDEPENDENT_AMBULATORY_CARE_PROVIDER_SITE_OTHER): Payer: Medicare Other | Admitting: Internal Medicine

## 2023-04-23 VITALS — BP 156/90 | HR 81 | Temp 98.4°F | Resp 16 | Ht 67.0 in | Wt 144.8 lb

## 2023-04-23 DIAGNOSIS — R5383 Other fatigue: Secondary | ICD-10-CM | POA: Diagnosis not present

## 2023-04-23 DIAGNOSIS — M0579 Rheumatoid arthritis with rheumatoid factor of multiple sites without organ or systems involvement: Secondary | ICD-10-CM

## 2023-04-23 DIAGNOSIS — Z8709 Personal history of other diseases of the respiratory system: Secondary | ICD-10-CM

## 2023-04-23 DIAGNOSIS — R0602 Shortness of breath: Secondary | ICD-10-CM | POA: Diagnosis not present

## 2023-04-23 MED ORDER — FUROSEMIDE 20 MG PO TABS: ORAL_TABLET | ORAL | 3 refills | Status: DC

## 2023-04-23 MED ORDER — POTASSIUM CHLORIDE ER 8 MEQ PO CPCR: ORAL_CAPSULE | ORAL | 3 refills | Status: DC

## 2023-04-23 NOTE — Progress Notes (Signed)
[facility] [address] [place], [place] [address]  Internal MEDICINE  Office Visit Note  Patient Name: Jose Cuevas  [id number]  [id number]  Date of Service: 04/29/2023  Chief Complaint  Patient presents with   Follow-up    Review Ct     HPI Pt is here with her daughter for follow up  Elevated blood pressure CT scan chest is discussed  COPD is controlled      Current Medication: Outpatient Encounter Medications as of 04/23/2023  Medication Sig Note   Aspirin-Caffeine (BC FAST PAIN RELIEF ARTHRITIS PO) Take by mouth.    certolizumab pegol (CIMZIA) 2 X 200 MG KIT Inject 400 mg into the skin every 30 (thirty) days. Done at rheumatology office.    clonazePAM (KLONOPIN) 1 MG tablet Take 1 tablet (1 mg total) by mouth at bedtime as needed for anxiety.    DULoxetine (CYMBALTA) 30 MG capsule Take 1 capsule (30 mg total) by mouth at bedtime.    folic acid (FOLVITE) 1 MG tablet Take 1 mg by mouth daily.    furosemide (LASIX) 20 MG tablet Take one tab po M/W/F    levocetirizine (XYZAL) 5 MG tablet TAKE 1 TABLET BY MOUTH EVERY DAY IN THE EVENING    losartan-hydrochlorothiazide (HYZAAR) 100-25 MG tablet Take 1 tablet by mouth daily.    methotrexate 50 MG/2ML injection Inject 50 mg/m2 into the vein once. 0.33mls once every 2 weeks 06/19/2019: Wednesday    metoCLOPramide (REGLAN) 10 MG tablet Take 1 tablet (10 mg total) by mouth 3 (three) times daily before meals.    mirtazapine (REMERON) 15 MG tablet Take 1 tablet (15 mg total) by mouth at bedtime.    Oxycodone HCl 10 MG TABS Take by mouth.    pantoprazole (PROTONIX) 40 MG tablet Take 1 tablet (40 mg total) by mouth 2 (two) times daily.    Potassium Chloride CR (MICRO-K) 8 MEQ CPCR capsule CR TAKE ONE TAB PO M/W/F WITH LASIX    rosuvastatin (CRESTOR) 5 MG tablet Take 1 tablet (5 mg total) by mouth daily.    tamsulosin (FLOMAX) 0.4 MG CAPS capsule Take 1 capsule (0.4 mg total) by mouth daily.    vitamin B-12 (CYANOCOBALAMIN)  1000 MCG tablet Take 1,000 mcg by mouth daily.    [DISCONTINUED] furosemide (LASIX) 20 MG tablet Take 1 tablet (20 mg total) by mouth daily for 5 days.    No facility-administered encounter medications on file as of 04/23/2023.    Surgical History: Past Surgical History:  Procedure Laterality Date   BACK SURGERY     COLONOSCOPY WITH PROPOFOL N/A 11/11/2015   Procedure: COLONOSCOPY WITH PROPOFOL;  Surgeon: Elnita Maxwell, MD;  Location: [facility] ENDOSCOPY;  Service: Endoscopy;  Laterality: N/A;   COLONOSCOPY WITH PROPOFOL N/A 12/02/2015   Procedure: COLONOSCOPY WITH PROPOFOL;  Surgeon: Elnita Maxwell, MD;  Location: [facility] ENDOSCOPY;  Service: Endoscopy;  Laterality: N/A;   COLONOSCOPY WITH PROPOFOL N/A 09/16/2019   Procedure: COLONOSCOPY WITH PROPOFOL;  Surgeon: Toledo, Boykin Nearing, MD;  Location: [facility] ENDOSCOPY;  Service: Gastroenterology;  Laterality: N/A;   ESOPHAGOGASTRODUODENOSCOPY (EGD) WITH PROPOFOL N/A 11/11/2015   Procedure: ESOPHAGOGASTRODUODENOSCOPY (EGD) WITH PROPOFOL;  Surgeon: Elnita Maxwell, MD;  Location: [facility] ENDOSCOPY;  Service: Endoscopy;  Laterality: N/A;   ESOPHAGOGASTRODUODENOSCOPY (EGD) WITH PROPOFOL N/A 12/02/2015   Procedure: ESOPHAGOGASTRODUODENOSCOPY (EGD) WITH PROPOFOL;  Surgeon: Elnita Maxwell, MD;  Location: [facility] ENDOSCOPY;  Service: Endoscopy;  Laterality: N/A;   ESOPHAGOGASTRODUODENOSCOPY (EGD) WITH PROPOFOL N/A 01/20/2016  Procedure: ESOPHAGOGASTRODUODENOSCOPY (EGD) WITH PROPOFOL;  Surgeon: Elnita Maxwell, MD;  Location: [facility] ENDOSCOPY;  Service: Endoscopy;  Laterality: N/A;   ESOPHAGOGASTRODUODENOSCOPY (EGD) WITH PROPOFOL N/A 09/16/2019   Procedure: ESOPHAGOGASTRODUODENOSCOPY (EGD) WITH PROPOFOL;  Surgeon: Toledo, Boykin Nearing, MD;  Location: [facility] ENDOSCOPY;  Service: Gastroenterology;  Laterality: N/A;   KNEE RECONSTRUCTION Left     Medical History: Past Medical History:  Diagnosis Date   Back pain    Collagen vascular disease (HCC)     Rhematoid Arthritis hx.   Constipation, chronic    DDD (degenerative disc disease), cervical    History of duodenal ulcer    History of kidney stones    Hyperlipidemia    Hypertension    Low kidney function    LT Kidney non-functioning   Nondiabetic gastroparesis    Peripheral blood vessel disorder (HCC)    Rheumatoid arthritis with rheumatoid factor (HCC)    Sleep apnea    Weight loss     Family History: Family History  Problem Relation Age of Onset   Diabetes Mother    Diabetes Father    Heart attack Maternal Grandmother    COPD Brother        08/2020   Tourette syndrome Son        in prison 2019   Learning disabilities Son     Social History   Socioeconomic History   Marital status: Married    Spouse name: Not on file   Number of children: Not on file   Years of education: Not on file   Highest education level: Not on file  Occupational History   Not on file  Tobacco Use   Smoking status: Some Days    Types: Cigars   Smokeless tobacco: Never   Tobacco comments:    Occasional  3 a day  Vaping Use   Vaping status: Never Used  Substance and Sexual Activity   Alcohol use: No   Drug use: No   Sexual activity: Yes    Birth control/protection: None  Other Topics Concern   Not on file  Social History Narrative   Not on file   Social Determinants of Health   Financial Resource Strain: Not on file  Food Insecurity: Not on file  Transportation Needs: Not on file  Physical Activity: Not on file  Stress: Not on file  Social Connections: Not on file  Intimate Partner Violence: Not on file      Review of Systems  Constitutional:  Negative for fatigue and fever.  HENT:  Negative for congestion, mouth sores and postnasal drip.   Respiratory:  Negative for cough.   Cardiovascular:  Negative for chest pain.  Genitourinary:  Negative for flank pain.  Psychiatric/Behavioral: Negative.      Vital Signs: BP (!) 156/90   Pulse 81   Temp 98.4 F (36.9 C)    Resp 16   Ht 5\' 7"  (1.702 m)   Wt 144 lb 12.8 oz (65.7 kg)   SpO2 98%   BMI 22.68 kg/m    Physical Exam Constitutional:      Appearance: Normal appearance.  HENT:     Head: Normocephalic and atraumatic.     Nose: Nose normal.     Mouth/Throat:     Mouth: Mucous membranes are moist.     Pharynx: No posterior oropharyngeal erythema.  Eyes:     Extraocular Movements: Extraocular movements intact.     Pupils: Pupils are equal, round, and reactive to light.  Cardiovascular:  Pulses: Normal pulses.     Heart sounds: Normal heart sounds.  Pulmonary:     Effort: Pulmonary effort is normal.     Breath sounds: Normal breath sounds.  Neurological:     General: No focal deficit present.     Mental Status: He is alert.  Psychiatric:        Mood and Affect: Mood normal.        Behavior: Behavior normal.        Assessment/Plan: 1. Shortness of breath [R06.02] - 6 minute walk; Future No desaturation noted, will continue to monitor   2. Rheumatoid arthritis involving multiple sites with positive rheumatoid factor (HCC) Followed by rheumatology   3. Other fatigue CBC and metb ordered for future app  4. History of pleural effusion Start Lasix 20 mg M/W/F Along with KCL 8 meq m/w/f   General Counseling: Kaidan verbalizes understanding of the findings of todays visit and agrees with plan of treatment. I have discussed any further diagnostic evaluation that may be needed or ordered today. We also reviewed his medications today. he has been encouraged to call the office with any questions or concerns that should arise related to todays visit.    Orders Placed This Encounter  Procedures   6 minute walk    Meds ordered this encounter  Medications   furosemide (LASIX) 20 MG tablet    Sig: Take one tab po M/W/F    Dispense:  36 tablet    Refill:  3   Potassium Chloride CR (MICRO-K) 8 MEQ CPCR capsule CR    Sig: TAKE ONE TAB PO M/W/F WITH LASIX    Dispense:  36 capsule     Refill:  3    Total time spent:35 Minutes Time spent includes review of chart, medications, test results, and follow up plan with the patient.   [place] Controlled Substance Database was reviewed by me.   Dr Lyndon Code Internal medicine

## 2023-05-21 ENCOUNTER — Ambulatory Visit: Payer: Medicare Other | Admitting: Nurse Practitioner

## 2023-07-03 ENCOUNTER — Other Ambulatory Visit: Payer: Medicare Other

## 2023-07-11 ENCOUNTER — Encounter: Payer: Self-pay | Admitting: Nurse Practitioner

## 2023-07-11 ENCOUNTER — Ambulatory Visit (INDEPENDENT_AMBULATORY_CARE_PROVIDER_SITE_OTHER): Payer: Medicare Other | Admitting: Nurse Practitioner

## 2023-07-11 ENCOUNTER — Other Ambulatory Visit: Payer: Self-pay | Admitting: Nurse Practitioner

## 2023-07-11 VITALS — BP 138/76 | HR 75 | Temp 98.1°F | Resp 16 | Ht 67.0 in | Wt 144.0 lb

## 2023-07-11 DIAGNOSIS — G4709 Other insomnia: Secondary | ICD-10-CM

## 2023-07-11 DIAGNOSIS — Z0000 Encounter for general adult medical examination without abnormal findings: Secondary | ICD-10-CM | POA: Diagnosis not present

## 2023-07-11 DIAGNOSIS — F329 Major depressive disorder, single episode, unspecified: Secondary | ICD-10-CM

## 2023-07-11 DIAGNOSIS — Z79899 Other long term (current) drug therapy: Secondary | ICD-10-CM

## 2023-07-11 DIAGNOSIS — F17210 Nicotine dependence, cigarettes, uncomplicated: Secondary | ICD-10-CM

## 2023-07-11 DIAGNOSIS — M0579 Rheumatoid arthritis with rheumatoid factor of multiple sites without organ or systems involvement: Secondary | ICD-10-CM

## 2023-07-11 DIAGNOSIS — I700 Atherosclerosis of aorta: Secondary | ICD-10-CM

## 2023-07-11 DIAGNOSIS — R918 Other nonspecific abnormal finding of lung field: Secondary | ICD-10-CM

## 2023-07-11 DIAGNOSIS — Z23 Encounter for immunization: Secondary | ICD-10-CM | POA: Diagnosis not present

## 2023-07-11 DIAGNOSIS — I6522 Occlusion and stenosis of left carotid artery: Secondary | ICD-10-CM

## 2023-07-11 DIAGNOSIS — F331 Major depressive disorder, recurrent, moderate: Secondary | ICD-10-CM

## 2023-07-11 MED ORDER — CLONAZEPAM 1 MG PO TABS
1.0000 mg | ORAL_TABLET | Freq: Every evening | ORAL | 2 refills | Status: DC | PRN
Start: 2023-07-11 — End: 2023-10-10

## 2023-07-11 MED ORDER — METOCLOPRAMIDE HCL 10 MG PO TABS
10.0000 mg | ORAL_TABLET | Freq: Three times a day (TID) | ORAL | 2 refills | Status: DC
Start: 2023-07-11 — End: 2023-12-12

## 2023-07-11 MED ORDER — LEVOCETIRIZINE DIHYDROCHLORIDE 5 MG PO TABS: ORAL_TABLET | ORAL | 1 refills | Status: DC

## 2023-07-11 MED ORDER — MIRTAZAPINE 15 MG PO TABS
15.0000 mg | ORAL_TABLET | Freq: Every day | ORAL | 2 refills | Status: DC
Start: 2023-07-11 — End: 2024-02-03

## 2023-07-11 MED ORDER — DULOXETINE HCL 30 MG PO CPEP
30.0000 mg | ORAL_CAPSULE | Freq: Every day | ORAL | 1 refills | Status: DC
Start: 2023-07-11 — End: 2024-02-03

## 2023-07-11 MED ORDER — LOSARTAN POTASSIUM-HCTZ 100-25 MG PO TABS
1.0000 | ORAL_TABLET | Freq: Every day | ORAL | 1 refills | Status: DC
Start: 2023-07-11 — End: 2023-11-28

## 2023-07-11 MED ORDER — PANTOPRAZOLE SODIUM 40 MG PO TBEC
40.0000 mg | DELAYED_RELEASE_TABLET | Freq: Two times a day (BID) | ORAL | 1 refills | Status: AC
Start: 2023-07-11 — End: ?

## 2023-07-11 NOTE — Progress Notes (Signed)
[facility] ([facility]) [address] [place], [place] [address] [phone]  Patient Name: ERASTO KUEHLER DOB: Jun 04, 1961 MRN: [id number]  Date of Service: 07/11/23   Medicare Annual Wellness Visit (Subsequent)  PCP: Sallyanne Kuster, NP Cardiologist: Debbe Odea, MD  Pulmonologist: Dr. Freda Munro Urologist: Dr. Legrand Rams Gastroenterologist: Dr. Rosina Lowenstein Rheumatologist: Dr. Gerrie Nordmann Pain management: Dr. Marko Plume    Subjective:  CC -- Medicare Annual wellness visit.  Additional concerns? yes Patient reports taking amitiza now for constipation  --last carotid ultrasound was done in 2022 and showed greater than 70% stenosis of the left carotid artery and moderate to severe plaque formation. He is due to have this ultrasound repeated.     General Healthcare: Medication Compliance: yes  ASCVD Risk as of 07/11/23: 24% high risk Aspirin: no  Dx Hypertension: yes   Dx Hyperlipidemia: yes with aortic atherosclerosis and left carotid stenosis. Diabetes: no  Dx Obesity: no  Weight Loss: no  Urinary Incontinence: no   The 10-year ASCVD risk score (Arnett DK, et al., 2019) is: 24%   Values used to calculate the score:     Age: 2 years     Sex: Male     Is Non-Hispanic African American: No     Diabetic: No     Tobacco smoker: Yes     Systolic Blood Pressure: 138 mmHg     Is BP treated: Yes     HDL Cholesterol: 37 mg/dL     Total Cholesterol: 192 mg/dL   Social Determinants of Health: SDOH Screenings   Alcohol Screen: Low Risk  (03/23/2022)  Depression (PHQ2-9): Low Risk  (07/11/2023)  Tobacco Use: High Risk (07/11/2023)     Where does the patient live? Does the patient drive? yes Family support: Community support: Spiritual beliefs: Advance Directives?   No but patient's wife is aware of his wishes  Employment: no, retired/disabled  Able to read and write: yes    Cancer:  Colorectal >> Colonoscopy: no, not due now; Due date:  2030 Lung >> Tobacco Use: yes  - If so, previous Low-Dose CT screen: yes, most recent CT chest done in July this year.  Skin >> Suspicious lesions: no   Other: PSA level: no, last PSA was done in 2021 and was normal. Due for recheck Zoster Vaccine: no, patient declined Flu Vaccine: yes, received during visit today   RSV Vaccine: no, declined Pneumonia Vaccine: no, declined      07/11/2023    1:35 PM 05/15/2021    2:29 PM 04/25/2020    3:20 PM  MMSE - Mini Mental State Exam  Orientation to time 5 4 5   Orientation to Place 5 5 5   Registration 3 3 3   Attention/ Calculation 0 5 5  Recall 3 3 3   Language- name 2 objects 2 2 2   Language- repeat 1 1 1   Language- follow 3 step command 3 3 3   Language- read & follow direction 1 1 1   Write a sentence 0 0 1  Copy design 1 1 1   Total score 24 28 30     Functional Status Survey: Is the patient deaf or have difficulty hearing?: Yes Does the patient have difficulty seeing, even when wearing glasses/contacts?: No Does the patient have difficulty concentrating, remembering, or making decisions?: Yes Does the patient have difficulty walking or climbing stairs?: Yes Does the patient have difficulty dressing or bathing?: No Does the patient have difficulty doing errands alone such as visiting a doctor's office or shopping?:  Yes      03/23/2022   11:06 AM 04/23/2022    1:20 PM 07/23/2022    1:40 PM 10/18/2022    2:51 PM 07/11/2023    1:34 PM  Fall Risk  Falls in the past year? 0 0 0 0 0  Was there an injury with Fall?   0 0 0  Fall Risk Category Calculator   0 0 0  Fall Risk Category (Retired)   Low    (RETIRED) Patient Fall Risk Level Low fall risk Low fall risk Low fall risk    Patient at Risk for Falls Due to No Fall Risks No Fall Risks No Fall Risks No Fall Risks No Fall Risks  Fall risk Follow up Falls evaluation completed Falls evaluation completed Falls evaluation completed Falls evaluation completed Falls evaluation completed         07/11/2023    1:34 PM  Depression screen PHQ 2/9  Decreased Interest 0  Down, Depressed, Hopeless 0  PHQ - 2 Score 0         ROS   Past Medical History Patient Active Problem List   Diagnosis Date Noted   Essential hypertension 03/12/2021   Chronic fatigue and malaise 03/12/2021   Tobacco dependence with current use 03/12/2021   B12 deficiency 03/12/2021   Vitamin D deficiency 03/12/2021   Pulmonary nodules 05/28/2020   Atrophic kidney, acquired 05/28/2020   Renal calculus, left 05/28/2020   Aortic atherosclerosis (HCC) 05/28/2020   Poison oak dermatitis 05/28/2020   Encounter for general adult medical examination with abnormal findings 04/22/2018   Tick bite of abdominal wall 04/22/2018   Candida rash of groin 04/22/2018   PAD (peripheral artery disease) (HCC) 01/30/2018   Back pain 04/21/2014   Rheumatoid arthritis with rheumatoid factor (HCC) 04/21/2014    Medications- reviewed and updated Current Outpatient Medications  Medication Sig Dispense Refill   Aspirin-Caffeine (BC FAST PAIN RELIEF ARTHRITIS PO) Take by mouth.     certolizumab pegol (CIMZIA) 2 X 200 MG KIT Inject 400 mg into the skin every 30 (thirty) days. Done at rheumatology office.     folic acid (FOLVITE) 1 MG tablet Take 1 mg by mouth daily.     furosemide (LASIX) 20 MG tablet Take one tab po M/W/F 36 tablet 3   hydroxychloroquine (PLAQUENIL) 200 MG tablet Take one tablet TWICE daily weekdays. Take one tablet ONCE daily weekends. Take with food.     Oxycodone HCl 10 MG TABS Take by mouth.     Potassium Chloride CR (MICRO-K) 8 MEQ CPCR capsule CR TAKE ONE TAB PO M/W/F WITH LASIX 36 capsule 3   rosuvastatin (CRESTOR) 5 MG tablet Take 1 tablet (5 mg total) by mouth daily. 90 tablet 1   tamsulosin (FLOMAX) 0.4 MG CAPS capsule Take 1 capsule (0.4 mg total) by mouth daily. 90 capsule 3   vitamin B-12 (CYANOCOBALAMIN) 1000 MCG tablet Take 1,000 mcg by mouth daily.     clonazePAM (KLONOPIN) 1 MG tablet  Take 1 tablet (1 mg total) by mouth at bedtime as needed for anxiety. 30 tablet 2   DULoxetine (CYMBALTA) 30 MG capsule Take 1 capsule (30 mg total) by mouth at bedtime. 90 capsule 1   levocetirizine (XYZAL) 5 MG tablet TAKE 1 TABLET BY MOUTH EVERY DAY IN THE EVENING 90 tablet 1   losartan-hydrochlorothiazide (HYZAAR) 100-25 MG tablet Take 1 tablet by mouth daily. 90 tablet 1   lubiprostone (AMITIZA) 24 MCG capsule Take 24 mcg by mouth 2 (two)  times daily.     methotrexate 50 MG/2ML injection Inject 50 mg/m2 into the vein once. 0.39mls once every 2 weeks (Patient not taking: Reported on 07/11/2023)     metoCLOPramide (REGLAN) 10 MG tablet Take 1 tablet (10 mg total) by mouth 3 (three) times daily before meals. 90 tablet 2   mirtazapine (REMERON) 15 MG tablet Take 1 tablet (15 mg total) by mouth at bedtime. 90 tablet 2   pantoprazole (PROTONIX) 40 MG tablet Take 1 tablet (40 mg total) by mouth 2 (two) times daily. 90 tablet 1   No current facility-administered medications for this visit.    Objective: BP 138/76   Pulse 75   Temp 98.1 F (36.7 C)   Resp 16   Ht 5\' 7"  (1.702 m)   Wt 144 lb (65.3 kg)   SpO2 97%   BMI 22.55 kg/m  Gen: NAD, alert, cooperative with exam CV: RRR, good S1/S2, no murmur Resp: CTABL, no wheezes, non-labored Neuro: Alert and oriented, No gross deficits   Assessment/Plan: 1. Encounter for subsequent annual wellness visit (AWV) in Medicare patient Age-appropriate preventive screenings and vaccinations discussed, annual physical exam completed. Routine labs for health maintenance previously ordered, reminded patient to have labs drawn. PHM updated.   2. Aortic atherosclerosis (HCC) Continue rosuvastatin as prescribed.  Repeat carotid ultrasound ordered   3. Left carotid artery stenosis Repeat carotid ultrasound ordered   4. Rheumatoid arthritis involving multiple sites with positive rheumatoid factor (HCC) Followed by rheumatology. Continue medications and  follow up as instructed and prescribed.   5. Other insomnia Continue clonazepam as prescribed.  - clonazePAM (KLONOPIN) 1 MG tablet; Take 1 tablet (1 mg total) by mouth at bedtime as needed for anxiety.  Dispense: 30 tablet; Refill: 2  7. Multiple pulmonary nodules determined by computed tomography of lung Follow up CT chest ordered, follow up with results  - CT CHEST NODULE FOLLOW UP LOW DOSE W/O; Future  8. Encounter for medication review Medication list reviewed with patient, updated, and multiple refills ordered  - levocetirizine (XYZAL) 5 MG tablet; TAKE 1 TABLET BY MOUTH EVERY DAY IN THE EVENING  Dispense: 90 tablet; Refill: 1 - losartan-hydrochlorothiazide (HYZAAR) 100-25 MG tablet; Take 1 tablet by mouth daily.  Dispense: 90 tablet; Refill: 1 - pantoprazole (PROTONIX) 40 MG tablet; Take 1 tablet (40 mg total) by mouth 2 (two) times daily.  Dispense: 90 tablet; Refill: 1 - mirtazapine (REMERON) 15 MG tablet; Take 1 tablet (15 mg total) by mouth at bedtime.  Dispense: 90 tablet; Refill: 2 - metoCLOPramide (REGLAN) 10 MG tablet; Take 1 tablet (10 mg total) by mouth 3 (three) times daily before meals.  Dispense: 90 tablet; Refill: 2 - lubiprostone (AMITIZA) 24 MCG capsule; Take 24 mcg by mouth 2 (two) times daily. - hydroxychloroquine (PLAQUENIL) 200 MG tablet; Take one tablet TWICE daily weekdays. Take one tablet ONCE daily weekends. Take with food.  8. Needs flu shot Flu vaccine administered in office today - Influenza, MDCK, trivalent, PF(Flucelvax egg-free)  9. Smokes less than 2 packs a day with greater than 40 pack year history Follow up CT chest ordered, will follow up with results  - CT CHEST NODULE FOLLOW UP LOW DOSE W/O; Future  10. Moderate episode of recurrent major depressive disorder (HCC) Continue duloxetine as prescribed.  - DULoxetine (CYMBALTA) 30 MG capsule; Take 1 capsule (30 mg total) by mouth at bedtime.  Dispense: 90 capsule; Refill: 1    Orders Placed  This Encounter  Procedures  CT CHEST NODULE FOLLOW UP LOW DOSE W/O    Standing Status:   Future    Standing Expiration Date:   07/10/2024    Order Specific Question:   Preferred imaging location?    Answer:   [facility]   Influenza, MDCK, trivalent, PF(Flucelvax egg-free)    Meds ordered this encounter  Medications   clonazePAM (KLONOPIN) 1 MG tablet    Sig: Take 1 tablet (1 mg total) by mouth at bedtime as needed for anxiety.    Dispense:  30 tablet    Refill:  2   levocetirizine (XYZAL) 5 MG tablet    Sig: TAKE 1 TABLET BY MOUTH EVERY DAY IN THE EVENING    Dispense:  90 tablet    Refill:  1   losartan-hydrochlorothiazide (HYZAAR) 100-25 MG tablet    Sig: Take 1 tablet by mouth daily.    Dispense:  90 tablet    Refill:  1   pantoprazole (PROTONIX) 40 MG tablet    Sig: Take 1 tablet (40 mg total) by mouth 2 (two) times daily.    Dispense:  90 tablet    Refill:  1   DULoxetine (CYMBALTA) 30 MG capsule    Sig: Take 1 capsule (30 mg total) by mouth at bedtime.    Dispense:  90 capsule    Refill:  1    For future refills   mirtazapine (REMERON) 15 MG tablet    Sig: Take 1 tablet (15 mg total) by mouth at bedtime.    Dispense:  90 tablet    Refill:  2    Note increased dose and # of tablets. Please discontinue previous orders for this medication.   metoCLOPramide (REGLAN) 10 MG tablet    Sig: Take 1 tablet (10 mg total) by mouth 3 (three) times daily before meals.    Dispense:  90 tablet    Refill:  2    Return in about 3 months (around 10/04/2023) for F/U, anxiety med refill, Jenet Durio PCP.  Total time spent:30 Minutes Time spent includes review of chart, medications, test results, and follow up plan with the patient.   [place] Controlled Substance Database was reviewed by me.  This patient was seen by Sallyanne Kuster, FNP-C in collaboration with Dr. Beverely Risen as a part of collaborative care agreement.  Mannie Wineland R. Tedd Sias, MSN, FNP-C Internal medicine/Primary Care [facility]

## 2023-07-12 ENCOUNTER — Other Ambulatory Visit: Payer: Self-pay | Admitting: Nurse Practitioner

## 2023-07-13 LAB — COMPREHENSIVE METABOLIC PANEL
ALT: 21 [IU]/L (ref 0–44)
AST: 14 [IU]/L (ref 0–40)
Albumin: 3.7 g/dL — ABNORMAL LOW (ref 3.9–4.9)
Alkaline Phosphatase: 77 [IU]/L (ref 44–121)
BUN/Creatinine Ratio: 13 (ref 10–24)
BUN: 14 mg/dL (ref 8–27)
Bilirubin Total: 0.4 mg/dL (ref 0.0–1.2)
CO2: 24 mmol/L (ref 20–29)
Calcium: 8.9 mg/dL (ref 8.6–10.2)
Chloride: 107 mmol/L — ABNORMAL HIGH (ref 96–106)
Creatinine, Ser: 1.06 mg/dL (ref 0.76–1.27)
Globulin, Total: 2 g/dL (ref 1.5–4.5)
Glucose: 77 mg/dL (ref 70–99)
Potassium: 4.4 mmol/L (ref 3.5–5.2)
Sodium: 143 mmol/L (ref 134–144)
Total Protein: 5.7 g/dL — ABNORMAL LOW (ref 6.0–8.5)
eGFR: 79 mL/min/{1.73_m2} (ref >59)

## 2023-07-13 LAB — CBC WITH DIFFERENTIAL/PLATELET
Basophils Absolute: 0.1 10*3/uL (ref 0.0–0.2)
Basos: 1 %
EOS (ABSOLUTE): 0.2 10*3/uL (ref 0.0–0.4)
Eos: 3 %
Hematocrit: 42.5 % (ref 37.5–51.0)
Hemoglobin: 14.1 g/dL (ref 13.0–17.7)
Immature Grans (Abs): 0 10*3/uL (ref 0.0–0.1)
Immature Granulocytes: 1 %
Lymphocytes Absolute: 3 10*3/uL (ref 0.7–3.1)
Lymphs: 40 %
MCH: 31.1 pg (ref 26.6–33.0)
MCHC: 33.2 g/dL (ref 31.5–35.7)
MCV: 94 fL (ref 79–97)
Monocytes Absolute: 0.3 10*3/uL (ref 0.1–0.9)
Monocytes: 4 %
Neutrophils Absolute: 3.8 10*3/uL (ref 1.4–7.0)
Neutrophils: 51 %
Platelets: 190 10*3/uL (ref 150–450)
RBC: 4.53 x10E6/uL (ref 4.14–5.80)
RDW: 16.4 % — ABNORMAL HIGH (ref 11.6–15.4)
WBC: 7.5 10*3/uL (ref 3.4–10.8)

## 2023-07-13 LAB — IRON AND TIBC
Iron Saturation: 15 % (ref 15–55)
Iron: 40 ug/dL (ref 38–169)
Total Iron Binding Capacity: 260 ug/dL (ref 250–450)
UIBC: 220 ug/dL (ref 111–343)

## 2023-07-13 LAB — T4, FREE: Free T4: 0.91 ng/dL (ref 0.82–1.77)

## 2023-07-13 LAB — LIPID PANEL
Chol/HDL Ratio: 2.5 {ratio} (ref 0.0–5.0)
Cholesterol, Total: 136 mg/dL (ref 100–199)
HDL: 55 mg/dL (ref >39)
LDL Chol Calc (NIH): 60 mg/dL (ref 0–99)
Triglycerides: 120 mg/dL (ref 0–149)
VLDL Cholesterol Cal: 21 mg/dL (ref 5–40)

## 2023-07-13 LAB — B12 AND FOLATE PANEL
Folate: >20 ng/mL (ref >3.0)
Vitamin B-12: 1258 pg/mL — ABNORMAL HIGH (ref 232–1245)

## 2023-07-13 LAB — FERRITIN: Ferritin: 56 ng/mL (ref 30–400)

## 2023-07-13 LAB — VITAMIN D 25 HYDROXY (VIT D DEFICIENCY, FRACTURES): Vit D, 25-Hydroxy: 25.9 ng/mL — ABNORMAL LOW (ref 30.0–100.0)

## 2023-07-13 LAB — TSH: TSH: 1.07 u[IU]/mL (ref 0.450–4.500)

## 2023-07-17 ENCOUNTER — Encounter: Payer: Self-pay | Admitting: Nurse Practitioner

## 2023-07-18 ENCOUNTER — Ambulatory Visit
Admission: RE | Admit: 2023-07-18 | Discharge: 2023-07-18 | Disposition: A | Discharge status: Home / Self Care | Payer: Medicare Other | Source: Ambulatory Visit | Attending: Nurse Practitioner | Admitting: Nurse Practitioner

## 2023-07-18 DIAGNOSIS — R918 Other nonspecific abnormal finding of lung field: Secondary | ICD-10-CM

## 2023-07-18 DIAGNOSIS — F17210 Nicotine dependence, cigarettes, uncomplicated: Secondary | ICD-10-CM

## 2023-08-01 ENCOUNTER — Ambulatory Visit: Payer: Medicare Other | Admitting: Nurse Practitioner

## 2023-08-09 NOTE — Progress Notes (Signed)
Will discuss at upcoming visit next week.

## 2023-08-09 NOTE — Progress Notes (Signed)
Will discuss results at upcoming visit on 11/11

## 2023-08-12 ENCOUNTER — Ambulatory Visit (INDEPENDENT_AMBULATORY_CARE_PROVIDER_SITE_OTHER): Payer: Medicare Other | Admitting: Nurse Practitioner

## 2023-08-12 ENCOUNTER — Encounter: Payer: Self-pay | Admitting: Nurse Practitioner

## 2023-08-12 VITALS — BP 150/74 | HR 71 | Temp 98.2°F | Resp 16 | Ht 67.0 in | Wt 143.4 lb

## 2023-08-12 DIAGNOSIS — R918 Other nonspecific abnormal finding of lung field: Secondary | ICD-10-CM

## 2023-08-12 DIAGNOSIS — R770 Abnormality of albumin: Secondary | ICD-10-CM

## 2023-08-12 DIAGNOSIS — J432 Centrilobular emphysema: Secondary | ICD-10-CM | POA: Diagnosis not present

## 2023-08-12 DIAGNOSIS — I700 Atherosclerosis of aorta: Secondary | ICD-10-CM

## 2023-08-12 DIAGNOSIS — N261 Atrophy of kidney (terminal): Secondary | ICD-10-CM

## 2023-08-12 MED ORDER — TRELEGY ELLIPTA 100-62.5-25 MCG/ACT IN AEPB
1.0000 | INHALATION_SPRAY | Freq: Every day | RESPIRATORY_TRACT | 11 refills | Status: DC
Start: 2023-08-12 — End: 2024-02-03

## 2023-08-12 NOTE — Progress Notes (Signed)
Jose Cuevas [address] [place], [place] [address]  Internal MEDICINE  Office Visit Note  Patient Name: Jose Cuevas  [id number]  [id number]  Date of Service: 08/12/2023  Chief Complaint  Patient presents with   Hypertension   Hyperlipidemia   Follow-up    Review CT results    HPI [name] presents for a follow-up visit for COPD, high cholesterol, low protein, and low vitamin D CT results IMPRESSION: No acute intrathoracic pathology. Stable bilateral small pleural effusions. Similar appearance of bilateral pulmonary nodules measuring up to8 mm in the anterior inferior right upper lobe. Follow-up as per recommendation of prior CT. Atrophic left kidney. A 14 mm left UPJ stone. Aortic Atherosclerosis (ICD10-I70.0) and Emphysema (ICD10-J43.9). Cholesterol is back to normal Albuminemia and low protein Low vitamin D  Cough is chronic and may be associated with emphysema, patient is not on a maintenance inhaler.    Current Medication: Outpatient Encounter Medications as of 08/12/2023  Medication Sig Note   Aspirin-Caffeine (BC FAST PAIN RELIEF ARTHRITIS PO) Take by mouth.    certolizumab pegol (CIMZIA) 2 X 200 MG KIT Inject 400 mg into the skin every 30 (thirty) days. Done at rheumatology office.    clonazePAM (KLONOPIN) 1 MG tablet Take 1 tablet (1 mg total) by mouth at bedtime as needed for anxiety.    DULoxetine (CYMBALTA) 30 MG capsule Take 1 capsule (30 mg total) by mouth at bedtime.    Fluticasone-Umeclidin-Vilant (TRELEGY ELLIPTA) 100-62.5-25 MCG/ACT AEPB Inhale 1 puff into the lungs daily.    folic acid (FOLVITE) 1 MG tablet Take 1 mg by mouth daily.    furosemide (LASIX) 20 MG tablet Take one tab po M/W/F    hydroxychloroquine (PLAQUENIL) 200 MG tablet Take one tablet TWICE daily weekdays. Take one tablet ONCE daily weekends. Take with food.    levocetirizine (XYZAL) 5 MG tablet TAKE 1 TABLET BY MOUTH EVERY DAY IN THE EVENING    losartan-hydrochlorothiazide  (HYZAAR) 100-25 MG tablet Take 1 tablet by mouth daily.    lubiprostone (AMITIZA) 24 MCG capsule Take 24 mcg by mouth 2 (two) times daily.    metoCLOPramide (REGLAN) 10 MG tablet Take 1 tablet (10 mg total) by mouth 3 (three) times daily before meals.    mirtazapine (REMERON) 15 MG tablet Take 1 tablet (15 mg total) by mouth at bedtime.    Oxycodone HCl 10 MG TABS Take by mouth.    pantoprazole (PROTONIX) 40 MG tablet Take 1 tablet (40 mg total) by mouth 2 (two) times daily.    Potassium Chloride CR (MICRO-K) 8 MEQ CPCR capsule CR TAKE ONE TAB PO M/W/F WITH LASIX    rosuvastatin (CRESTOR) 5 MG tablet Take 1 tablet (5 mg total) by mouth daily.    tamsulosin (FLOMAX) 0.4 MG CAPS capsule Take 1 capsule (0.4 mg total) by mouth daily.    vitamin B-12 (CYANOCOBALAMIN) 1000 MCG tablet Take 1,000 mcg by mouth daily.    methotrexate 50 MG/2ML injection Inject 50 mg/m2 into the vein once. 0.59mls once every 2 weeks (Patient not taking: Reported on 08/12/2023) 06/19/2019: Wednesday    No facility-administered encounter medications on file as of 08/12/2023.    Surgical History: Past Surgical History:  Procedure Laterality Date   BACK SURGERY     COLONOSCOPY WITH PROPOFOL N/A 11/11/2015   Procedure: COLONOSCOPY WITH PROPOFOL;  Surgeon: Elnita Maxwell, MD;  Location: [facility] ENDOSCOPY;  Service: Endoscopy;  Laterality: N/A;   COLONOSCOPY WITH PROPOFOL N/A 12/02/2015   Procedure: COLONOSCOPY  WITH PROPOFOL;  Surgeon: Elnita Maxwell, MD;  Location: [facility] ENDOSCOPY;  Service: Endoscopy;  Laterality: N/A;   COLONOSCOPY WITH PROPOFOL N/A 09/16/2019   Procedure: COLONOSCOPY WITH PROPOFOL;  Surgeon: Toledo, Boykin Nearing, MD;  Location: [facility] ENDOSCOPY;  Service: Gastroenterology;  Laterality: N/A;   ESOPHAGOGASTRODUODENOSCOPY (EGD) WITH PROPOFOL N/A 11/11/2015   Procedure: ESOPHAGOGASTRODUODENOSCOPY (EGD) WITH PROPOFOL;  Surgeon: Elnita Maxwell, MD;  Location: [facility] END[facility]COPY;  Service: Endoscopy;  Laterality:  N/A;   ESOPHAGOGASTRODUODENOSCOPY (EGD) WITH PROPOFOL N/A 12/02/2015   Procedure: ESOPHAGOGASTRODUODENOSCOPY (EGD) WITH PROPOFOL;  Surgeon: Elnita Maxwell, MD;  Location: [facility] ENDOSCOPY;  Service: Endoscopy;  Laterality: N/A;   ESOPHAGOGASTRODUODENOSCOPY (EGD) WITH PROPOFOL N/A 01/20/2016   Procedure: ESOPHAGOGASTRODUODENOSCOPY (EGD) WITH PROPOFOL;  Surgeon: Elnita Maxwell, MD;  Location: [facility] END[facility]COPY;  Service: Endoscopy;  Laterality: N/A;   ESOPHAGOGASTRODUODENOSCOPY (EGD) WITH PROPOFOL N/A 09/16/2019   Procedure: ESOPHAGOGASTRODUODENOSCOPY (EGD) WITH PROPOFOL;  Surgeon: Toledo, Boykin Nearing, MD;  Location: [facility] ENDOSCOPY;  Service: Gastroenterology;  Laterality: N/A;   KNEE RECONSTRUCTION Left     Medical History: Past Medical History:  Diagnosis Date   Back pain    Collagen vascular disease (HCC)    Rhematoid Arthritis hx.   Constipation, chronic    DDD (degenerative disc disease), cervical    History of duodenal ulcer    History of kidney stones    Hyperlipidemia    Hypertension    Low kidney function    LT Kidney non-functioning   Nondiabetic gastroparesis    Peripheral blood vessel disorder (HCC)    Rheumatoid arthritis with rheumatoid factor (HCC)    Sleep apnea    Weight loss     Family History: Family History  Problem Relation Age of Onset   Diabetes Mother    Diabetes Father    Heart attack Maternal Grandmother    COPD Brother        08/2020   Tourette syndrome Son        in prison 2019   Learning disabilities Son     Social History   Socioeconomic History   Marital status: Married    Spouse name: Not on file   Number of children: Not on file   Years of education: Not on file   Highest education level: Not on file  Occupational History   Not on file  Tobacco Use   Smoking status: Some Days    Types: Cigars   Smokeless tobacco: Never   Tobacco comments:    Occasional  3 a day  Vaping Use   Vaping status: Never Used  Substance and Sexual  Activity   Alcohol use: No   Drug use: No   Sexual activity: Yes    Birth control/protection: None  Other Topics Concern   Not on file  Social History Narrative   Not on file   Social Determinants of Health   Financial Resource Strain: Not on file  Food Insecurity: Not on file  Transportation Needs: Not on file  Physical Activity: Not on file  Stress: Not on file  Social Connections: Not on file  Intimate Partner Violence: Not on file      Review of Systems  Constitutional:  Negative for fatigue and fever.  HENT:  Negative for congestion, mouth sores and postnasal drip.   Respiratory:  Negative for cough.   Cardiovascular:  Negative for chest pain.  Genitourinary:  Negative for flank pain.  Psychiatric/Behavioral: Negative.      Vital Signs: BP (!) 150/74  Pulse 71   Temp 98.2 F (36.8 C)   Resp 16   Ht 5\' 7"  (1.702 m)   Wt 143 lb 6.4 oz (65 kg)   SpO2 95%   BMI 22.46 kg/m    Physical Exam Vitals reviewed.  Constitutional:      General: He is not in acute distress.    Appearance: Normal appearance. He is normal weight. He is not ill-appearing.  HENT:     Head: Normocephalic and atraumatic.  Eyes:     Pupils: Pupils are equal, round, and reactive to light.  Cardiovascular:     Rate and Rhythm: Normal rate and regular rhythm.     Heart sounds: Normal heart sounds. No murmur heard. Pulmonary:     Effort: Pulmonary effort is normal. No respiratory distress.     Breath sounds: Normal breath sounds. No wheezing.  Neurological:     Mental Status: He is alert and oriented to person, place, and time.  Psychiatric:        Mood and Affect: Mood normal.        Behavior: Behavior normal.        Assessment/Plan: 1. Centrilobular emphysema (HCC) Try trelegy inhaler, follow up in 2 months - Fluticasone-Umeclidin-Vilant (TRELEGY ELLIPTA) 100-62.5-25 MCG/ACT AEPB; Inhale 1 puff into the lungs daily.  Dispense: 1 each; Refill: 11  2. Pulmonary  nodules Stable, repeat CT chest in December 2025  3. Aortic atherosclerosis (HCC) Continue rosuvastatin as prescribed   4. Atrophic kidney, acquired Continue to monitor labs routinely   5. Low serum albumin Encouraged patient to drink a protein drink/shake once daily.    General Counseling: Kiyansh verbalizes understanding of the findings of todays visit and agrees with plan of treatment. I have discussed any further diagnostic evaluation that may be needed or ordered today. We also reviewed his medications today. he has been encouraged to call the office with any questions or concerns that should arise related to todays visit.    No orders of the defined types were placed in this encounter.   Meds ordered this encounter  Medications   Fluticasone-Umeclidin-Vilant (TRELEGY ELLIPTA) 100-62.5-25 MCG/ACT AEPB    Sig: Inhale 1 puff into the lungs daily.    Dispense:  1 each    Refill:  11    Return for previously scheduled, F/U, Ailene Royal PCP in january 2025.   Total time spent:30 Minutes Time spent includes review of chart, medications, test results, and follow up plan with the patient.   [place] Controlled Substance Database was reviewed by me.  This patient was seen by Sallyanne Kuster, FNP-C in collaboration with Dr. Beverely Risen as a part of collaborative care agreement.   Punam Broussard R. Tedd Sias, MSN, FNP-C Internal medicine

## 2023-08-30 ENCOUNTER — Encounter: Payer: Self-pay | Admitting: Nurse Practitioner

## 2023-08-30 DIAGNOSIS — I6522 Occlusion and stenosis of left carotid artery: Secondary | ICD-10-CM | POA: Insufficient documentation

## 2023-08-30 DIAGNOSIS — F331 Major depressive disorder, recurrent, moderate: Secondary | ICD-10-CM | POA: Insufficient documentation

## 2023-08-30 DIAGNOSIS — G4709 Other insomnia: Secondary | ICD-10-CM | POA: Insufficient documentation

## 2023-09-22 ENCOUNTER — Other Ambulatory Visit: Payer: Self-pay | Admitting: Nurse Practitioner

## 2023-09-22 DIAGNOSIS — Z79899 Other long term (current) drug therapy: Secondary | ICD-10-CM

## 2023-10-10 ENCOUNTER — Encounter: Payer: Self-pay | Admitting: Nurse Practitioner

## 2023-10-10 ENCOUNTER — Ambulatory Visit (INDEPENDENT_AMBULATORY_CARE_PROVIDER_SITE_OTHER): Payer: Medicare Other | Admitting: Nurse Practitioner

## 2023-10-10 VITALS — BP 138/70 | HR 74 | Temp 98.3°F | Resp 16 | Ht 67.0 in | Wt 151.0 lb

## 2023-10-10 DIAGNOSIS — J432 Centrilobular emphysema: Secondary | ICD-10-CM | POA: Diagnosis not present

## 2023-10-10 DIAGNOSIS — Z716 Tobacco abuse counseling: Secondary | ICD-10-CM | POA: Diagnosis not present

## 2023-10-10 DIAGNOSIS — G4709 Other insomnia: Secondary | ICD-10-CM

## 2023-10-10 DIAGNOSIS — I10 Essential (primary) hypertension: Secondary | ICD-10-CM

## 2023-10-10 MED ORDER — VARENICLINE TARTRATE (STARTER) 0.5 MG X 11 & 1 MG X 42 PO TBPK
ORAL_TABLET | ORAL | 0 refills | Status: DC
Start: 2023-10-10 — End: 2023-11-19

## 2023-10-10 MED ORDER — CLONAZEPAM 1 MG PO TABS
1.0000 mg | ORAL_TABLET | Freq: Every evening | ORAL | 2 refills | Status: DC | PRN
Start: 2023-10-10 — End: 2023-12-05

## 2023-10-10 NOTE — Progress Notes (Signed)
 [facility] [address] [place], [place] [address]  Internal MEDICINE  Office Visit Note  Patient Name: Jose Cuevas  [id number]  [id number]  Date of Service: 10/10/2023  Chief Complaint  Patient presents with   Hypertension   Hyperlipidemia   Follow-up    HPI Corban presents for a follow-up visit for hypertension, anxiety, insomnia, and smoking cessation.  Smoking cessation -- smokes cigars, about 1 pack per day, 10-20 small cigars per day. Has tried the nicotine  patches and nicotine  gum.  Anxiety and insomnia -- takes clonazepam  at bedtime which works well.  Elevated blood pressure -- improved when rechecked.    Current Medication: Outpatient Encounter Medications as of 10/10/2023  Medication Sig Note   Aspirin -Caffeine (BC FAST PAIN RELIEF ARTHRITIS PO) Take by mouth.    certolizumab pegol (CIMZIA) 2 X 200 MG KIT Inject 400 mg into the skin every 30 (thirty) days. Done at rheumatology office.    DULoxetine  (CYMBALTA ) 30 MG capsule Take 1 capsule (30 mg total) by mouth at bedtime.    Fluticasone-Umeclidin-Vilant (TRELEGY ELLIPTA ) 100-62.5-25 MCG/ACT AEPB Inhale 1 puff into the lungs daily.    folic acid  (FOLVITE ) 1 MG tablet Take 1 mg by mouth daily.    furosemide  (LASIX ) 20 MG tablet Take one tab po M/W/F    hydroxychloroquine  (PLAQUENIL ) 200 MG tablet Take one tablet TWICE daily weekdays. Take one tablet ONCE daily weekends. Take with food.    levocetirizine (XYZAL ) 5 MG tablet TAKE 1 TABLET BY MOUTH EVERY DAY IN THE EVENING    losartan -hydrochlorothiazide  (HYZAAR) 100-25 MG tablet Take 1 tablet by mouth daily.    lubiprostone  (AMITIZA ) 24 MCG capsule Take 24 mcg by mouth 2 (two) times daily.    methotrexate  50 MG/2ML injection Inject 50 mg/m2 into the vein once. 0.4mls once every 2 weeks 06/19/2019: Wednesday    metoCLOPramide  (REGLAN ) 10 MG tablet Take 1 tablet (10 mg total) by mouth 3 (three) times daily before meals.    mirtazapine  (REMERON ) 15 MG tablet Take 1  tablet (15 mg total) by mouth at bedtime.    Oxycodone  HCl 10 MG TABS Take by mouth.    pantoprazole  (PROTONIX ) 40 MG tablet Take 1 tablet (40 mg total) by mouth 2 (two) times daily.    Potassium Chloride  CR (MICRO-K ) 8 MEQ CPCR capsule CR TAKE ONE TAB PO M/W/F WITH LASIX     rosuvastatin  (CRESTOR ) 5 MG tablet TAKE 1 TABLET (5 MG TOTAL) BY MOUTH DAILY.    tamsulosin  (FLOMAX ) 0.4 MG CAPS capsule Take 1 capsule (0.4 mg total) by mouth daily.    vitamin B-12 (CYANOCOBALAMIN ) 1000 MCG tablet Take 1,000 mcg by mouth daily.    [DISCONTINUED] clonazePAM  (KLONOPIN ) 1 MG tablet Take 1 tablet (1 mg total) by mouth at bedtime as needed for anxiety.    clonazePAM  (KLONOPIN ) 1 MG tablet Take 1 tablet (1 mg total) by mouth at bedtime as needed for anxiety.    Varenicline  Tartrate, Starter, 0.5 MG X 11 & 1 MG X 42 TBPK Take 0.5 mg by mouth once daily on days 1-3, then take 0.5 mg twice daily on days 4-7, then increase to 1 mg twice daily    No facility-administered encounter medications on file as of 10/10/2023.    Surgical History: Past Surgical History:  Procedure Laterality Date   BACK SURGERY     COLONOSCOPY WITH PROPOFOL  N/A 11/11/2015   Procedure: COLONOSCOPY WITH PROPOFOL ;  Surgeon: Donnice Vaughn Manes, MD;  Location: [facility] ENDOSCOPY;  Service: Endoscopy;  Laterality: N/A;   COLONOSCOPY WITH PROPOFOL  N/A 12/02/2015   Procedure: COLONOSCOPY WITH PROPOFOL ;  Surgeon: Donnice Vaughn Manes, MD;  Location: [facility] ENDOSCOPY;  Service: Endoscopy;  Laterality: N/A;   COLONOSCOPY WITH PROPOFOL  N/A 09/16/2019   Procedure: COLONOSCOPY WITH PROPOFOL ;  Surgeon: Toledo, Ladell POUR, MD;  Location: [facility] ENDOSCOPY;  Service: Gastroenterology;  Laterality: N/A;   ESOPHAGOGASTRODUODENOSCOPY (EGD) WITH PROPOFOL  N/A 11/11/2015   Procedure: ESOPHAGOGASTRODUODENOSCOPY (EGD) WITH PROPOFOL ;  Surgeon: Donnice Vaughn Manes, MD;  Location: [facility] ENDOSCOPY;  Service: Endoscopy;  Laterality: N/A;   ESOPHAGOGASTRODUODENOSCOPY (EGD) WITH  PROPOFOL  N/A 12/02/2015   Procedure: ESOPHAGOGASTRODUODENOSCOPY (EGD) WITH PROPOFOL ;  Surgeon: Donnice Vaughn Manes, MD;  Location: [facility] ENDOSCOPY;  Service: Endoscopy;  Laterality: N/A;   ESOPHAGOGASTRODUODENOSCOPY (EGD) WITH PROPOFOL  N/A 01/20/2016   Procedure: ESOPHAGOGASTRODUODENOSCOPY (EGD) WITH PROPOFOL ;  Surgeon: Donnice Vaughn Manes, MD;  Location: [facility] ENDOSCOPY;  Service: Endoscopy;  Laterality: N/A;   ESOPHAGOGASTRODUODENOSCOPY (EGD) WITH PROPOFOL  N/A 09/16/2019   Procedure: ESOPHAGOGASTRODUODENOSCOPY (EGD) WITH PROPOFOL ;  Surgeon: Toledo, Ladell POUR, MD;  Location: [facility] ENDOSCOPY;  Service: Gastroenterology;  Laterality: N/A;   KNEE RECONSTRUCTION Left     Medical History: Past Medical History:  Diagnosis Date   Back pain    Collagen vascular disease (HCC)    Rhematoid Arthritis hx.   Constipation, chronic    DDD (degenerative disc disease), cervical    History of duodenal ulcer    History of kidney stones    Hyperlipidemia    Hypertension    Low kidney function    LT Kidney non-functioning   Nondiabetic gastroparesis    Peripheral blood vessel disorder (HCC)    Rheumatoid arthritis with rheumatoid factor (HCC)    Sleep apnea    Weight loss     Family History: Family History  Problem Relation Age of Onset   Diabetes Mother    Diabetes Father    Heart attack Maternal Grandmother    COPD Brother        08/2020   Tourette syndrome Son        in prison 2019   Learning disabilities Son     Social History   Socioeconomic History   Marital status: Married    Spouse name: Not on file   Number of children: Not on file   Years of education: Not on file   Highest education level: Not on file  Occupational History   Not on file  Tobacco Use   Smoking status: Some Days    Types: Cigars   Smokeless tobacco: Never   Tobacco comments:    Occasional  3 a day  Vaping Use   Vaping status: Never Used  Substance and Sexual Activity   Alcohol  use: No   Drug use: No    Sexual activity: Yes    Birth control/protection: None  Other Topics Concern   Not on file  Social History Narrative   Not on file   Social Drivers of Health   Financial Resource Strain: Not on file  Food Insecurity: Not on file  Transportation Needs: Not on file  Physical Activity: Not on file  Stress: Not on file  Social Connections: Not on file  Intimate Partner Violence: Not on file      Review of Systems  Constitutional:  Negative for fatigue and fever.  HENT:  Negative for congestion, mouth sores and postnasal drip.   Respiratory:  Negative for cough.   Cardiovascular:  Negative for chest pain.  Genitourinary:  Negative for flank pain.  Psychiatric/Behavioral:  Negative.      Vital Signs: BP 138/70 Comment: 176/90  Pulse 74   Temp 98.3 F (36.8 C)   Resp 16   Ht 5' 7 (1.702 m)   Wt 151 lb (68.5 kg)   SpO2 98%   BMI 23.65 kg/m    Physical Exam Vitals reviewed.  Constitutional:      General: He is not in acute distress.    Appearance: Normal appearance. He is normal weight. He is not ill-appearing.  HENT:     Head: Normocephalic and atraumatic.  Eyes:     Pupils: Pupils are equal, round, and reactive to light.  Cardiovascular:     Rate and Rhythm: Normal rate and regular rhythm.     Heart sounds: Normal heart sounds. No murmur heard. Pulmonary:     Effort: Pulmonary effort is normal. No respiratory distress.     Breath sounds: Normal breath sounds. No wheezing.  Neurological:     Mental Status: He is alert and oriented to person, place, and time.  Psychiatric:        Mood and Affect: Mood normal.        Behavior: Behavior normal.        Assessment/Plan: 1. Essential hypertension (Primary) Stable, continue furosemide  and losartan -hydrochlorothiazide  as prescribed.   2. Centrilobular emphysema (HCC) Continue trelegy ellipta  as prescribed.   3. Other insomnia Continue clonazepam  as prescribed. Follow up in 3 months for additional refills.   - clonazePAM  (KLONOPIN ) 1 MG tablet; Take 1 tablet (1 mg total) by mouth at bedtime as needed for anxiety.  Dispense: 30 tablet; Refill: 2  4. Encounter for smoking cessation counseling Start chatix as prescribed.  - Varenicline  Tartrate, Starter, 0.5 MG X 11 & 1 MG X 42 TBPK; Take 0.5 mg by mouth once daily on days 1-3, then take 0.5 mg twice daily on days 4-7, then increase to 1 mg twice daily  Dispense: 53 each; Refill: 0   General Counseling: Lael verbalizes understanding of the findings of todays visit and agrees with plan of treatment. I have discussed any further diagnostic evaluation that may be needed or ordered today. We also reviewed his medications today. he has been encouraged to call the office with any questions or concerns that should arise related to todays visit.    No orders of the defined types were placed in this encounter.   Meds ordered this encounter  Medications   Varenicline  Tartrate, Starter, 0.5 MG X 11 & 1 MG X 42 TBPK    Sig: Take 0.5 mg by mouth once daily on days 1-3, then take 0.5 mg twice daily on days 4-7, then increase to 1 mg twice daily    Dispense:  53 each    Refill:  0    Fill new script today   clonazePAM  (KLONOPIN ) 1 MG tablet    Sig: Take 1 tablet (1 mg total) by mouth at bedtime as needed for anxiety.    Dispense:  30 tablet    Refill:  2    Return in about 3 months (around 01/06/2024) for F/U, anxiety med refill, Kenneth Lax PCP.   Total time spent:30 Minutes Time spent includes review of chart, medications, test results, and follow up plan with the patient.   [place] Controlled Substance Database was reviewed by me.  This patient was seen by Mardy Maxin, FNP-C in collaboration with Dr. Sigrid Bathe as a part of collaborative care agreement.   Chassie Pennix R. Maxin, MSN, FNP-C Internal medicine

## 2023-10-25 ENCOUNTER — Ambulatory Visit (INDEPENDENT_AMBULATORY_CARE_PROVIDER_SITE_OTHER): Payer: Medicare Other | Admitting: Physician Assistant

## 2023-10-25 ENCOUNTER — Encounter: Payer: Self-pay | Admitting: Physician Assistant

## 2023-10-25 VITALS — BP 162/90 | HR 79 | Temp 98.2°F | Resp 16 | Ht 67.0 in | Wt 148.0 lb

## 2023-10-25 DIAGNOSIS — I10 Essential (primary) hypertension: Secondary | ICD-10-CM | POA: Diagnosis not present

## 2023-10-25 DIAGNOSIS — J432 Centrilobular emphysema: Secondary | ICD-10-CM | POA: Diagnosis not present

## 2023-10-25 DIAGNOSIS — J069 Acute upper respiratory infection, unspecified: Secondary | ICD-10-CM

## 2023-10-25 MED ORDER — ALBUTEROL SULFATE HFA 108 (90 BASE) MCG/ACT IN AERS
2.0000 | INHALATION_SPRAY | Freq: Four times a day (QID) | RESPIRATORY_TRACT | 0 refills | Status: AC | PRN
Start: 2023-10-25 — End: ?

## 2023-10-25 MED ORDER — AZITHROMYCIN 250 MG PO TABS
ORAL_TABLET | ORAL | 0 refills | Status: DC
Start: 2023-10-25 — End: 2023-11-28

## 2023-10-25 MED ORDER — PREDNISONE 10 MG PO TABS
ORAL_TABLET | ORAL | 0 refills | Status: DC
Start: 2023-10-25 — End: 2023-11-28

## 2023-10-25 NOTE — Progress Notes (Signed)
[facility] [address] [place], [place] [address]  Internal MEDICINE  Office Visit Note  Patient Name: Jose Cuevas  [id number]  [id number]  Date of Service: 10/25/2023  Chief Complaint  Patient presents with   Acute Visit   Cough   Shortness of Breath     HPI Pt is here for a sick visit. -Symptoms started 1 week ago -wife has been sick for 2 weeks and was seen in office yesterday -Having cough, a little SOB, wheezing, hurts to cough, coughs so much he feels like it might make him throw up but has not -No fever or chills, no N/V - has not taken BP meds yet today and will do so now as it stayed elevated on recheck -does use trelegy daily, but does not have albuterol inhaler -does still smoke  Current Medication:  Outpatient Encounter Medications as of 10/25/2023  Medication Sig Note   albuterol (VENTOLIN HFA) 108 (90 Base) MCG/ACT inhaler Inhale 2 puffs into the lungs every 6 (six) hours as needed for wheezing or shortness of breath.    Aspirin-Caffeine (BC FAST PAIN RELIEF ARTHRITIS PO) Take by mouth.    azithromycin (ZITHROMAX) 250 MG tablet Take one tab a day for 10 days for uri    certolizumab pegol (CIMZIA) 2 X 200 MG KIT Inject 400 mg into the skin every 30 (thirty) days. Done at rheumatology office.    clonazePAM (KLONOPIN) 1 MG tablet Take 1 tablet (1 mg total) by mouth at bedtime as needed for anxiety.    DULoxetine (CYMBALTA) 30 MG capsule Take 1 capsule (30 mg total) by mouth at bedtime.    Fluticasone-Umeclidin-Vilant (TRELEGY ELLIPTA) 100-62.5-25 MCG/ACT AEPB Inhale 1 puff into the lungs daily.    folic acid (FOLVITE) 1 MG tablet Take 1 mg by mouth daily.    furosemide (LASIX) 20 MG tablet Take one tab po M/W/F    hydroxychloroquine (PLAQUENIL) 200 MG tablet Take one tablet TWICE daily weekdays. Take one tablet ONCE daily weekends. Take with food.    levocetirizine (XYZAL) 5 MG tablet TAKE 1 TABLET BY MOUTH EVERY DAY IN THE EVENING     losartan-hydrochlorothiazide (HYZAAR) 100-25 MG tablet Take 1 tablet by mouth daily.    lubiprostone (AMITIZA) 24 MCG capsule Take 24 mcg by mouth 2 (two) times daily.    methotrexate 50 MG/2ML injection Inject 50 mg/m2 into the vein once. 0.40mls once every 2 weeks 06/19/2019: Wednesday    metoCLOPramide (REGLAN) 10 MG tablet Take 1 tablet (10 mg total) by mouth 3 (three) times daily before meals.    mirtazapine (REMERON) 15 MG tablet Take 1 tablet (15 mg total) by mouth at bedtime.    Oxycodone HCl 10 MG TABS Take by mouth.    pantoprazole (PROTONIX) 40 MG tablet Take 1 tablet (40 mg total) by mouth 2 (two) times daily.    Potassium Chloride CR (MICRO-K) 8 MEQ CPCR capsule CR TAKE ONE TAB PO M/W/F WITH LASIX    predniSONE (DELTASONE) 10 MG tablet Take one tab 3 x day for 3 days, then take one tab 2 x a day for 3 days and then take one tab a day for 3 days for copd    rosuvastatin (CRESTOR) 5 MG tablet TAKE 1 TABLET (5 MG TOTAL) BY MOUTH DAILY.    tamsulosin (FLOMAX) 0.4 MG CAPS capsule Take 1 capsule (0.4 mg total) by mouth daily.    Varenicline Tartrate, Starter, 0.5 MG X 11 & 1 MG X 42 TBPK  Take 0.5 mg by mouth once daily on days 1-3, then take 0.5 mg twice daily on days 4-7, then increase to 1 mg twice daily    vitamin B-12 (CYANOCOBALAMIN) 1000 MCG tablet Take 1,000 mcg by mouth daily.    No facility-administered encounter medications on file as of 10/25/2023.      Medical History: Past Medical History:  Diagnosis Date   Back pain    Collagen vascular disease (HCC)    Rhematoid Arthritis hx.   Constipation, chronic    DDD (degenerative disc disease), cervical    History of duodenal ulcer    History of kidney stones    Hyperlipidemia    Hypertension    Low kidney function    LT Kidney non-functioning   Nondiabetic gastroparesis    Peripheral blood vessel disorder (HCC)    Rheumatoid arthritis with rheumatoid factor (HCC)    Sleep apnea    Weight loss      Vital Signs: BP  (!) 162/90   Pulse 79   Temp 98.2 F (36.8 C)   Resp 16   Ht 5\' 7"  (1.702 m)   Wt 148 lb (67.1 kg)   SpO2 98%   BMI 23.18 kg/m    Review of Systems  Constitutional:  Positive for fatigue. Negative for chills and fever.  HENT:  Positive for congestion and postnasal drip. Negative for mouth sores.   Respiratory:  Positive for cough, shortness of breath and wheezing.   Cardiovascular:  Negative for chest pain.  Gastrointestinal:  Negative for diarrhea, nausea and vomiting.  Genitourinary:  Negative for flank pain.  Skin:  Negative for rash.  Psychiatric/Behavioral: Negative.      Physical Exam Vitals and nursing note reviewed.  Constitutional:      General: He is not in acute distress.    Appearance: Normal appearance. He is normal weight. He is ill-appearing.  HENT:     Head: Normocephalic and atraumatic.  Eyes:     Pupils: Pupils are equal, round, and reactive to light.  Cardiovascular:     Rate and Rhythm: Normal rate and regular rhythm.     Heart sounds: Normal heart sounds. No murmur heard. Pulmonary:     Effort: Pulmonary effort is normal. No respiratory distress.     Breath sounds: Normal breath sounds. No wheezing, rhonchi or rales.  Neurological:     Mental Status: He is alert and oriented to person, place, and time.  Psychiatric:        Mood and Affect: Mood normal.        Behavior: Behavior normal.       Assessment/Plan: 1. Upper respiratory tract infection, unspecified type (Primary) Will send zpak and prednisone and use inhalers as prescribed. Advised to call or go to ED if new or worsening symptoms - predniSONE (DELTASONE) 10 MG tablet; Take one tab 3 x day for 3 days, then take one tab 2 x a day for 3 days and then take one tab a day for 3 days for copd  Dispense: 18 tablet; Refill: 0 - azithromycin (ZITHROMAX) 250 MG tablet; Take one tab a day for 10 days for uri  Dispense: 10 tablet; Refill: 0  2. Centrilobular emphysema (HCC) Continue trelegy  daily and may use albuterol as needed - albuterol (VENTOLIN HFA) 108 (90 Base) MCG/ACT inhaler; Inhale 2 puffs into the lungs every 6 (six) hours as needed for wheezing or shortness of breath.  Dispense: 8 g; Refill: 0  3. Essential hypertension Elevated in  office, but has not taken medication yet and will do so and monitor   General Counseling: Lakeith verbalizes understanding of the findings of todays visit and agrees with plan of treatment. I have discussed any further diagnostic evaluation that may be needed or ordered today. We also reviewed his medications today. he has been encouraged to call the office with any questions or concerns that should arise related to todays visit.    Counseling:    No orders of the defined types were placed in this encounter.   Meds ordered this encounter  Medications   azithromycin (ZITHROMAX) 250 MG tablet    Sig: Take one tab a day for 10 days for uri    Dispense:  10 tablet    Refill:  0   predniSONE (DELTASONE) 10 MG tablet    Sig: Take one tab 3 x day for 3 days, then take one tab 2 x a day for 3 days and then take one tab a day for 3 days for copd    Dispense:  18 tablet    Refill:  0   albuterol (VENTOLIN HFA) 108 (90 Base) MCG/ACT inhaler    Sig: Inhale 2 puffs into the lungs every 6 (six) hours as needed for wheezing or shortness of breath.    Dispense:  8 g    Refill:  0    Time spent:30 Minutes

## 2023-11-08 ENCOUNTER — Other Ambulatory Visit: Payer: Self-pay | Admitting: Nurse Practitioner

## 2023-11-08 DIAGNOSIS — Z79899 Other long term (current) drug therapy: Secondary | ICD-10-CM

## 2023-11-16 ENCOUNTER — Other Ambulatory Visit: Payer: Self-pay | Admitting: Nurse Practitioner

## 2023-11-16 DIAGNOSIS — Z716 Tobacco abuse counseling: Secondary | ICD-10-CM

## 2023-11-18 NOTE — Telephone Encounter (Signed)
 Please review ok to send

## 2023-11-19 MED ORDER — VARENICLINE TARTRATE 1 MG PO TABS
1.0000 mg | ORAL_TABLET | Freq: Two times a day (BID) | ORAL | 1 refills | Status: DC
Start: 2023-11-19 — End: 2023-11-27

## 2023-11-26 ENCOUNTER — Emergency Department: Payer: Medicare Other

## 2023-11-26 ENCOUNTER — Inpatient Hospital Stay
Admission: EM | Admit: 2023-11-26 | Discharge: 2023-12-05 | DRG: 853 | Disposition: A | Discharge status: Skilled Nursing Facility | Payer: Medicare Other | Attending: Student | Admitting: Student

## 2023-11-26 ENCOUNTER — Other Ambulatory Visit: Payer: Self-pay

## 2023-11-26 DIAGNOSIS — R6520 Severe sepsis without septic shock: Secondary | ICD-10-CM

## 2023-11-26 DIAGNOSIS — J189 Pneumonia, unspecified organism: Secondary | ICD-10-CM | POA: Diagnosis present

## 2023-11-26 DIAGNOSIS — M059 Rheumatoid arthritis with rheumatoid factor, unspecified: Secondary | ICD-10-CM | POA: Diagnosis present

## 2023-11-26 DIAGNOSIS — I5022 Chronic systolic (congestive) heart failure: Secondary | ICD-10-CM | POA: Diagnosis present

## 2023-11-26 DIAGNOSIS — I5A Non-ischemic myocardial injury (non-traumatic): Secondary | ICD-10-CM | POA: Diagnosis present

## 2023-11-26 DIAGNOSIS — I447 Left bundle-branch block, unspecified: Secondary | ICD-10-CM | POA: Diagnosis present

## 2023-11-26 DIAGNOSIS — Y92008 Other place in unspecified non-institutional (private) residence as the place of occurrence of the external cause: Secondary | ICD-10-CM | POA: Diagnosis not present

## 2023-11-26 DIAGNOSIS — Z833 Family history of diabetes mellitus: Secondary | ICD-10-CM

## 2023-11-26 DIAGNOSIS — J440 Chronic obstructive pulmonary disease with acute lower respiratory infection: Secondary | ICD-10-CM | POA: Diagnosis present

## 2023-11-26 DIAGNOSIS — I700 Atherosclerosis of aorta: Secondary | ICD-10-CM | POA: Diagnosis present

## 2023-11-26 DIAGNOSIS — I7121 Aneurysm of the ascending aorta, without rupture: Secondary | ICD-10-CM | POA: Diagnosis present

## 2023-11-26 DIAGNOSIS — W1830XA Fall on same level, unspecified, initial encounter: Secondary | ICD-10-CM | POA: Diagnosis present

## 2023-11-26 DIAGNOSIS — K219 Gastro-esophageal reflux disease without esophagitis: Secondary | ICD-10-CM | POA: Diagnosis present

## 2023-11-26 DIAGNOSIS — Z7902 Long term (current) use of antithrombotics/antiplatelets: Secondary | ICD-10-CM

## 2023-11-26 DIAGNOSIS — G4089 Other seizures: Secondary | ICD-10-CM | POA: Diagnosis present

## 2023-11-26 DIAGNOSIS — A419 Sepsis, unspecified organism: Secondary | ICD-10-CM | POA: Diagnosis present

## 2023-11-26 DIAGNOSIS — E872 Acidosis, unspecified: Secondary | ICD-10-CM | POA: Diagnosis present

## 2023-11-26 DIAGNOSIS — J432 Centrilobular emphysema: Secondary | ICD-10-CM | POA: Diagnosis present

## 2023-11-26 DIAGNOSIS — Z825 Family history of asthma and other chronic lower respiratory diseases: Secondary | ICD-10-CM

## 2023-11-26 DIAGNOSIS — J9601 Acute respiratory failure with hypoxia: Secondary | ICD-10-CM | POA: Diagnosis present

## 2023-11-26 DIAGNOSIS — R911 Solitary pulmonary nodule: Secondary | ICD-10-CM | POA: Diagnosis present

## 2023-11-26 DIAGNOSIS — R29714 NIHSS score 14: Secondary | ICD-10-CM | POA: Diagnosis present

## 2023-11-26 DIAGNOSIS — M6282 Rhabdomyolysis: Secondary | ICD-10-CM | POA: Diagnosis present

## 2023-11-26 DIAGNOSIS — I110 Hypertensive heart disease with heart failure: Secondary | ICD-10-CM | POA: Diagnosis present

## 2023-11-26 DIAGNOSIS — E785 Hyperlipidemia, unspecified: Secondary | ICD-10-CM | POA: Diagnosis present

## 2023-11-26 DIAGNOSIS — N179 Acute kidney failure, unspecified: Secondary | ICD-10-CM | POA: Diagnosis not present

## 2023-11-26 DIAGNOSIS — I639 Cerebral infarction, unspecified: Secondary | ICD-10-CM

## 2023-11-26 DIAGNOSIS — Z79899 Other long term (current) drug therapy: Secondary | ICD-10-CM

## 2023-11-26 DIAGNOSIS — Z8249 Family history of ischemic heart disease and other diseases of the circulatory system: Secondary | ICD-10-CM

## 2023-11-26 DIAGNOSIS — S43024A Posterior dislocation of right humerus, initial encounter: Secondary | ICD-10-CM | POA: Diagnosis present

## 2023-11-26 DIAGNOSIS — I63512 Cerebral infarction due to unspecified occlusion or stenosis of left middle cerebral artery: Secondary | ICD-10-CM | POA: Diagnosis present

## 2023-11-26 DIAGNOSIS — E559 Vitamin D deficiency, unspecified: Secondary | ICD-10-CM | POA: Diagnosis present

## 2023-11-26 DIAGNOSIS — R55 Syncope and collapse: Principal | ICD-10-CM

## 2023-11-26 DIAGNOSIS — Z7982 Long term (current) use of aspirin: Secondary | ICD-10-CM

## 2023-11-26 DIAGNOSIS — I2489 Other forms of acute ischemic heart disease: Secondary | ICD-10-CM | POA: Diagnosis present

## 2023-11-26 DIAGNOSIS — R6521 Severe sepsis with septic shock: Secondary | ICD-10-CM | POA: Diagnosis present

## 2023-11-26 DIAGNOSIS — F32A Depression, unspecified: Secondary | ICD-10-CM | POA: Diagnosis present

## 2023-11-26 DIAGNOSIS — K3184 Gastroparesis: Secondary | ICD-10-CM | POA: Diagnosis present

## 2023-11-26 DIAGNOSIS — Z1152 Encounter for screening for COVID-19: Secondary | ICD-10-CM | POA: Diagnosis not present

## 2023-11-26 DIAGNOSIS — K589 Irritable bowel syndrome without diarrhea: Secondary | ICD-10-CM | POA: Diagnosis present

## 2023-11-26 DIAGNOSIS — F419 Anxiety disorder, unspecified: Secondary | ICD-10-CM | POA: Diagnosis present

## 2023-11-26 DIAGNOSIS — R569 Unspecified convulsions: Secondary | ICD-10-CM | POA: Diagnosis not present

## 2023-11-26 DIAGNOSIS — E876 Hypokalemia: Secondary | ICD-10-CM | POA: Diagnosis present

## 2023-11-26 DIAGNOSIS — F17290 Nicotine dependence, other tobacco product, uncomplicated: Secondary | ICD-10-CM | POA: Diagnosis present

## 2023-11-26 DIAGNOSIS — I10 Essential (primary) hypertension: Secondary | ICD-10-CM | POA: Diagnosis present

## 2023-11-26 DIAGNOSIS — Z604 Social exclusion and rejection: Secondary | ICD-10-CM | POA: Diagnosis present

## 2023-11-26 DIAGNOSIS — Z7951 Long term (current) use of inhaled steroids: Secondary | ICD-10-CM

## 2023-11-26 LAB — COMPREHENSIVE METABOLIC PANEL
ALT: 35 U/L (ref 0–44)
AST: 41 U/L (ref 15–41)
Albumin: 3.5 g/dL (ref 3.5–5.0)
Alkaline Phosphatase: 93 U/L (ref 38–126)
Anion gap: 13 (ref 5–15)
BUN: 12 mg/dL (ref 8–23)
CO2: 17 mmol/L — ABNORMAL LOW (ref 22–32)
Calcium: 9 mg/dL (ref 8.9–10.3)
Chloride: 109 mmol/L (ref 98–111)
Creatinine, Ser: 1.33 mg/dL — ABNORMAL HIGH (ref 0.61–1.24)
GFR, Estimated: 60 mL/min — ABNORMAL LOW (ref >60)
Glucose, Bld: 138 mg/dL — ABNORMAL HIGH (ref 70–99)
Potassium: 3.1 mmol/L — ABNORMAL LOW (ref 3.5–5.1)
Sodium: 139 mmol/L (ref 135–145)
Total Bilirubin: 0.5 mg/dL (ref 0.0–1.2)
Total Protein: 7.3 g/dL (ref 6.5–8.1)

## 2023-11-26 LAB — ETHANOL: Alcohol, Ethyl (B): <10 mg/dL (ref <10)

## 2023-11-26 LAB — CBG MONITORING, ED: Glucose-Capillary: 135 mg/dL — ABNORMAL HIGH (ref 70–99)

## 2023-11-26 LAB — CBC WITH DIFFERENTIAL/PLATELET
Abs Immature Granulocytes: 0.82 10*3/uL — ABNORMAL HIGH (ref 0.00–0.07)
Basophils Absolute: 0.2 10*3/uL — ABNORMAL HIGH (ref 0.0–0.1)
Basophils Relative: 1 %
Eosinophils Absolute: 0.5 10*3/uL (ref 0.0–0.5)
Eosinophils Relative: 2 %
HCT: 44.6 % (ref 39.0–52.0)
Hemoglobin: 14.5 g/dL (ref 13.0–17.0)
Immature Granulocytes: 4 %
Lymphocytes Relative: 31 %
Lymphs Abs: 6.7 10*3/uL — ABNORMAL HIGH (ref 0.7–4.0)
MCH: 28.1 pg (ref 26.0–34.0)
MCHC: 32.5 g/dL (ref 30.0–36.0)
MCV: 86.4 fL (ref 80.0–100.0)
Monocytes Absolute: 0.9 10*3/uL (ref 0.1–1.0)
Monocytes Relative: 4 %
Neutro Abs: 12.5 10*3/uL — ABNORMAL HIGH (ref 1.7–7.7)
Neutrophils Relative %: 58 %
Platelets: 307 10*3/uL (ref 150–400)
RBC: 5.16 MIL/uL (ref 4.22–5.81)
RDW: 14.8 % (ref 11.5–15.5)
WBC: 21.6 10*3/uL — ABNORMAL HIGH (ref 4.0–10.5)
nRBC: 0.1 % (ref 0.0–0.2)

## 2023-11-26 LAB — RESP PANEL BY RT-PCR (RSV, FLU A&B, COVID)  RVPGX2
Influenza A by PCR: NEGATIVE
Influenza B by PCR: NEGATIVE
Resp Syncytial Virus by PCR: NEGATIVE
SARS Coronavirus 2 by RT PCR: NEGATIVE

## 2023-11-26 LAB — TROPONIN I (HIGH SENSITIVITY)
Troponin I (High Sensitivity): 54 ng/L — ABNORMAL HIGH (ref <18)
Troponin I (High Sensitivity): 125 ng/L (ref <18)

## 2023-11-26 LAB — LACTIC ACID, PLASMA
Lactic Acid, Venous: 4.4 mmol/L (ref 0.5–1.9)
Lactic Acid, Venous: 2.5 mmol/L (ref 0.5–1.9)

## 2023-11-26 LAB — LIPASE, BLOOD: Lipase: 84 U/L — ABNORMAL HIGH (ref 11–51)

## 2023-11-26 MED ORDER — IBUPROFEN 600 MG PO TABS
600.0000 mg | ORAL_TABLET | Freq: Once | ORAL | Status: AC
  Administered 2023-11-26: 600 mg via ORAL
  Filled 2023-11-26: qty 1

## 2023-11-26 MED ORDER — ONDANSETRON HCL 4 MG/2ML IJ SOLN
4.0000 mg | Freq: Four times a day (QID) | INTRAMUSCULAR | Status: DC | PRN
  Administered 2023-12-03: 4 mg via INTRAVENOUS

## 2023-11-26 MED ORDER — HYDROCOD POLI-CHLORPHE POLI ER 10-8 MG/5ML PO SUER: 5.0000 mL | Freq: Two times a day (BID) | ORAL | Status: DC | PRN

## 2023-11-26 MED ORDER — ALBUTEROL SULFATE (2.5 MG/3ML) 0.083% IN NEBU: 2.5000 mg | INHALATION_SOLUTION | Freq: Four times a day (QID) | RESPIRATORY_TRACT | Status: DC | PRN

## 2023-11-26 MED ORDER — HYDROXYCHLOROQUINE SULFATE 200 MG PO TABS
200.0000 mg | ORAL_TABLET | ORAL | Status: DC
  Administered 2023-11-27 – 2023-12-05 (×12): 200 mg via ORAL
  Filled 2023-11-26 (×14): qty 1

## 2023-11-26 MED ORDER — ENOXAPARIN SODIUM 40 MG/0.4ML IJ SOSY
40.0000 mg | PREFILLED_SYRINGE | INTRAMUSCULAR | Status: DC
  Administered 2023-11-26 – 2023-12-03 (×8): 40 mg via SUBCUTANEOUS
  Filled 2023-11-26 (×8): qty 0.4

## 2023-11-26 MED ORDER — TRAZODONE HCL 50 MG PO TABS
25.0000 mg | ORAL_TABLET | Freq: Every evening | ORAL | Status: DC | PRN
  Administered 2023-11-27 – 2023-11-28 (×2): 25 mg via ORAL
  Filled 2023-11-26 (×2): qty 1

## 2023-11-26 MED ORDER — GUAIFENESIN ER 600 MG PO TB12
600.0000 mg | ORAL_TABLET | Freq: Two times a day (BID) | ORAL | Status: DC
  Administered 2023-11-26 – 2023-12-05 (×17): 600 mg via ORAL
  Filled 2023-11-26 (×17): qty 1

## 2023-11-26 MED ORDER — SODIUM CHLORIDE 0.9 % IV SOLN
500.0000 mg | Freq: Once | INTRAVENOUS | Status: AC
  Administered 2023-11-26: 500 mg via INTRAVENOUS
  Filled 2023-11-26: qty 5

## 2023-11-26 MED ORDER — SODIUM CHLORIDE 0.9 % IV SOLN
500.0000 mg | INTRAVENOUS | Status: DC
  Administered 2023-11-27: 500 mg via INTRAVENOUS
  Filled 2023-11-26: qty 5

## 2023-11-26 MED ORDER — TAMSULOSIN HCL 0.4 MG PO CAPS
0.4000 mg | ORAL_CAPSULE | Freq: Every day | ORAL | Status: DC
  Administered 2023-11-27 – 2023-12-05 (×8): 0.4 mg via ORAL
  Filled 2023-11-26 (×8): qty 1

## 2023-11-26 MED ORDER — FOLIC ACID 1 MG PO TABS
1.0000 mg | ORAL_TABLET | Freq: Every day | ORAL | Status: AC
Start: 2023-11-27 — End: ?
  Administered 2023-11-27 – 2023-12-05 (×8): 1 mg via ORAL
  Filled 2023-11-26 (×8): qty 1

## 2023-11-26 MED ORDER — ACETAMINOPHEN 325 MG PO TABS
650.0000 mg | ORAL_TABLET | Freq: Four times a day (QID) | ORAL | Status: DC | PRN
  Administered 2023-11-29 – 2023-11-30 (×3): 650 mg via ORAL
  Filled 2023-11-26 (×3): qty 2

## 2023-11-26 MED ORDER — SODIUM CHLORIDE 0.9 % IV SOLN
1.0000 g | Freq: Once | INTRAVENOUS | Status: AC
  Administered 2023-11-26: 1 g via INTRAVENOUS
  Filled 2023-11-26: qty 10

## 2023-11-26 MED ORDER — POTASSIUM CHLORIDE CRYS ER 10 MEQ PO TBCR
10.0000 meq | EXTENDED_RELEASE_TABLET | ORAL | Status: DC
  Administered 2023-11-27 – 2023-11-29 (×2): 10 meq via ORAL
  Filled 2023-11-26 (×3): qty 1

## 2023-11-26 MED ORDER — LACTATED RINGERS IV SOLN
150.0000 mL/h | INTRAVENOUS | Status: DC
Start: 2023-11-26 — End: 2023-11-27
  Administered 2023-11-26 – 2023-11-27 (×3): 150 mL/h via INTRAVENOUS

## 2023-11-26 MED ORDER — POTASSIUM CHLORIDE 20 MEQ PO PACK
40.0000 meq | PACK | Freq: Once | ORAL | Status: AC
  Administered 2023-11-27: 40 meq via ORAL
  Filled 2023-11-26: qty 2

## 2023-11-26 MED ORDER — ONDANSETRON HCL 4 MG PO TABS: 4.0000 mg | ORAL_TABLET | Freq: Four times a day (QID) | ORAL | Status: DC | PRN

## 2023-11-26 MED ORDER — SODIUM CHLORIDE 0.9 % IV SOLN
2.0000 g | INTRAVENOUS | Status: AC
  Administered 2023-11-27 – 2023-11-30 (×4): 2 g via INTRAVENOUS
  Filled 2023-11-26 (×5): qty 20

## 2023-11-26 MED ORDER — PANTOPRAZOLE SODIUM 40 MG PO TBEC
40.0000 mg | DELAYED_RELEASE_TABLET | Freq: Two times a day (BID) | ORAL | Status: AC
Start: 2023-11-26 — End: ?
  Administered 2023-11-26 – 2023-12-05 (×17): 40 mg via ORAL
  Filled 2023-11-26 (×17): qty 1

## 2023-11-26 MED ORDER — ROSUVASTATIN CALCIUM 5 MG PO TABS
5.0000 mg | ORAL_TABLET | Freq: Every day | ORAL | Status: DC
  Administered 2023-11-27: 5 mg via ORAL
  Filled 2023-11-26: qty 1

## 2023-11-26 MED ORDER — SODIUM CHLORIDE 0.9 % IV BOLUS
1000.0000 mL | Freq: Once | INTRAVENOUS | Status: AC
  Administered 2023-11-26: 1000 mL via INTRAVENOUS

## 2023-11-26 MED ORDER — METOCLOPRAMIDE HCL 10 MG PO TABS
10.0000 mg | ORAL_TABLET | Freq: Three times a day (TID) | ORAL | Status: DC
Start: 2023-11-27 — End: 2023-12-05
  Administered 2023-11-27 – 2023-12-05 (×21): 10 mg via ORAL
  Filled 2023-11-26 (×3): qty 1
  Filled 2023-11-26: qty 2
  Filled 2023-11-26 (×9): qty 1
  Filled 2023-11-26 (×2): qty 2
  Filled 2023-11-26 (×2): qty 1
  Filled 2023-11-26: qty 2
  Filled 2023-11-26 (×2): qty 1
  Filled 2023-11-26: qty 2
  Filled 2023-11-26 (×6): qty 1

## 2023-11-26 MED ORDER — IOHEXOL 350 MG/ML SOLN
75.0000 mL | Freq: Once | INTRAVENOUS | Status: AC | PRN
  Administered 2023-11-26: 75 mL via INTRAVENOUS

## 2023-11-26 MED ORDER — MIRTAZAPINE 15 MG PO TABS
15.0000 mg | ORAL_TABLET | Freq: Every day | ORAL | Status: DC
  Administered 2023-11-26 – 2023-12-04 (×9): 15 mg via ORAL
  Filled 2023-11-26 (×9): qty 1

## 2023-11-26 MED ORDER — LOSARTAN POTASSIUM-HCTZ 100-25 MG PO TABS
1.0000 | ORAL_TABLET | Freq: Every day | ORAL | Status: DC
Start: 2023-11-26 — End: 2023-11-27

## 2023-11-26 MED ORDER — ONDANSETRON HCL 4 MG/2ML IJ SOLN
4.0000 mg | Freq: Once | INTRAMUSCULAR | Status: AC
  Administered 2023-11-26: 4 mg via INTRAVENOUS
  Filled 2023-11-26: qty 2

## 2023-11-26 MED ORDER — FUROSEMIDE 40 MG PO TABS
20.0000 mg | ORAL_TABLET | ORAL | Status: DC
  Administered 2023-11-27: 20 mg via ORAL
  Filled 2023-11-26: qty 1

## 2023-11-26 MED ORDER — ACETAMINOPHEN 650 MG RE SUPP: 650.0000 mg | Freq: Four times a day (QID) | RECTAL | Status: DC | PRN

## 2023-11-26 MED ORDER — DULOXETINE HCL 30 MG PO CPEP
30.0000 mg | ORAL_CAPSULE | Freq: Every day | ORAL | Status: DC
  Administered 2023-11-26 – 2023-12-04 (×9): 30 mg via ORAL
  Filled 2023-11-26 (×9): qty 1

## 2023-11-26 MED ORDER — IOHEXOL 300 MG/ML  SOLN: 75.0000 mL | Freq: Once | INTRAMUSCULAR | Status: DC | PRN

## 2023-11-26 MED ORDER — LUBIPROSTONE 24 MCG PO CAPS
24.0000 ug | ORAL_CAPSULE | Freq: Two times a day (BID) | ORAL | Status: DC
  Administered 2023-11-26 – 2023-12-05 (×16): 24 ug via ORAL
  Filled 2023-11-26 (×18): qty 1

## 2023-11-26 MED ORDER — VITAMIN B-12 1000 MCG PO TABS
1000.0000 ug | ORAL_TABLET | Freq: Every day | ORAL | Status: DC
  Administered 2023-11-27 – 2023-12-05 (×8): 1000 ug via ORAL
  Filled 2023-11-26 (×8): qty 1

## 2023-11-26 MED ORDER — CETIRIZINE HCL 10 MG PO TABS
10.0000 mg | ORAL_TABLET | Freq: Every evening | ORAL | Status: DC
  Administered 2023-11-26: 10 mg via ORAL
  Filled 2023-11-26: qty 1

## 2023-11-26 MED ORDER — OXYCODONE HCL 5 MG PO TABS
10.0000 mg | ORAL_TABLET | Freq: Four times a day (QID) | ORAL | Status: DC | PRN
  Administered 2023-11-26 – 2023-12-02 (×8): 10 mg via ORAL
  Filled 2023-11-26 (×9): qty 2

## 2023-11-26 MED ORDER — CLONAZEPAM 0.5 MG PO TABS
1.0000 mg | ORAL_TABLET | Freq: Every evening | ORAL | Status: DC | PRN
  Administered 2023-11-28: 1 mg via ORAL
  Filled 2023-11-26: qty 2

## 2023-11-26 MED ORDER — MAGNESIUM HYDROXIDE 400 MG/5ML PO SUSP
30.0000 mL | Freq: Every day | ORAL | Status: DC | PRN
  Administered 2023-11-30 – 2023-12-01 (×2): 30 mL via ORAL
  Filled 2023-11-26 (×2): qty 30

## 2023-11-26 MED ORDER — METHOTREXATE SODIUM CHEMO INJECTION 50 MG/2ML: 50.0000 mg/m2 | Freq: Once | INTRAMUSCULAR | Status: DC

## 2023-11-26 NOTE — ED Notes (Signed)
 Pt complaining of Right shoulder pain 3/10 sitting in bed. Pt denies needing any pain meds at this time.

## 2023-11-26 NOTE — ED Triage Notes (Signed)
 Pt BIB [facility] form Home for unresponsiveness. Pt ofund in floor covered in vomit. EMS states pt was outside moving furniture around came inside and collapsed. Pt baseline can walk and hold conversation. Pt complaining of feeling nauseated, vomitting currently. Pt denies CP, SOB, stomach pain. Pt denies any drugs or Etoh use. Pt found O2 sats 91% RA, was placed on 4L [place] with EMS, O2 at 94%.

## 2023-11-26 NOTE — ED Provider Notes (Signed)
 [facility] Provider Note   Event Date/Time   First MD Initiated Contact with Patient 11/26/23 1708     (approximate) History  unresponsive  HPI Jose Cuevas is a 63 y.o. male with a past medical history of emphysema who presents via EMS after syncopal episode at home.  Patient was walking inside a store and fell onto his right side where family found him breathing spontaneously but not responsive to voice.  EMS noted patient to be minimally responsive however protecting his airway, breathing spontaneously, and with normal vital signs.  Patient did have multiple episodes of vomiting and route as well.  Throughout transport, patient did become hypoxic with oxygen saturations as low as 90 and placed on 4 L nasal cannula with improvement to 94%.  Patient denies any complaints at this time ROS: Patient currently denies any vision changes, tinnitus, difficulty speaking, facial droop, sore throat, chest pain, shortness of breath, abdominal pain, nausea/vomiting/diarrhea, dysuria, or weakness/numbness/paresthesias in any extremity   Physical Exam  Triage Vital Signs: ED Triage Vitals  Encounter Vitals Group     BP      Systolic BP Percentile      Diastolic BP Percentile      Pulse      Resp      Temp      Temp src      SpO2      Weight      Height      Head Circumference      Peak Flow      Pain Score      Pain Loc      Pain Education      Exclude from Growth Chart    Most recent vital signs: Vitals:   11/26/23 1810 11/26/23 1830  BP: (!) 151/84   Pulse: 81 79  Resp: 18 18  SpO2: 97% 98%   General: Awake, cooperative, diaphoretic CV:  Good peripheral perfusion.  Resp:  Normal effort.  Abd:  No distention.  Other:  Diaphoretic middle-age well-developed, well-nourished Caucasian male resting comfortably in no acute distress ED Results / Procedures / Treatments  Labs (all labs ordered are listed, but only abnormal results are displayed) Labs Reviewed   COMPREHENSIVE METABOLIC PANEL - Abnormal; Notable for the following components:      Result Value   Potassium 3.1 (*)    CO2 17 (*)    Glucose, Bld 138 (*)    Creatinine, Ser 1.33 (*)    GFR, Estimated 60 (*)    All other components within normal limits  CBC WITH DIFFERENTIAL/PLATELET - Abnormal; Notable for the following components:   WBC 21.6 (*)    Neutro Abs 12.5 (*)    Lymphs Abs 6.7 (*)    Basophils Absolute 0.2 (*)    Abs Immature Granulocytes 0.82 (*)    All other components within normal limits  LACTIC ACID, PLASMA - Abnormal; Notable for the following components:   Lactic Acid, Venous 4.4 (*)    All other components within normal limits  LACTIC ACID, PLASMA - Abnormal; Notable for the following components:   Lactic Acid, Venous 2.5 (*)    All other components within normal limits  LIPASE, BLOOD - Abnormal; Notable for the following components:   Lipase 84 (*)    All other components within normal limits  CBG MONITORING, ED - Abnormal; Notable for the following components:   Glucose-Capillary 135 (*)    All other components within normal limits  TROPONIN I (HIGH SENSITIVITY) - Abnormal; Notable for the following components:   Troponin I (High Sensitivity) 54 (*)    All other components within normal limits  TROPONIN I (HIGH SENSITIVITY) - Abnormal; Notable for the following components:   Troponin I (High Sensitivity) 125 (*)    All other components within normal limits  RESP PANEL BY RT-PCR (RSV, FLU A&B, COVID)  [id number]  ETHANOL   EKG ED ECG REPORT I, Merwyn Katos, the attending physician, personally viewed and interpreted this ECG. Date: 11/26/2023 EKG Time: 1707 Rate: 92 Rhythm: normal sinus rhythm QRS Axis: normal Intervals: normal ST/T Wave abnormalities: normal Narrative Interpretation: no evidence of acute ischemia RADIOLOGY ED MD interpretation: Right shoulder x-ray independently interpreted and shows osteoarthritis with no acute displaced fracture.   There is emphysema with stable right pleural thickening CT of the head without contrast interpreted by me shows no evidence of acute abnormalities including no intracerebral hemorrhage, obvious masses, or significant edema One-view portable chest x-ray interpreted by me shows no evidence of acute abnormalities including no pneumonia, pneumothorax, or widened mediastinum -Agree with radiology assessment Official radiology report(s): CT Angio Chest PE W/Cm &/Or Wo Cm Result Date: 11/26/2023 CLINICAL DATA:  Pulmonary embolus suspected with high probability. Patient was found on the floor with vomit and unresponsive. EXAM: CT ANGIOGRAPHY CHEST WITH CONTRAST TECHNIQUE: Multidetector CT imaging of the chest was performed using the standard protocol during bolus administration of intravenous contrast. Multiplanar CT image reconstructions and MIPs were obtained to evaluate the vascular anatomy. RADIATION DOSE REDUCTION: This exam was performed according to the departmental dose-optimization program which includes automated exposure control, adjustment of the mA and/or kV according to patient size and/or use of iterative reconstruction technique. CONTRAST:  75mL OMNIPAQUE IOHEXOL 350 MG/ML SOLN COMPARISON:  Chest CT 07/18/2023.  Chest radiograph 11/26/2023 FINDINGS: Cardiovascular: Technically adequate study with good opacification of the central and segmental pulmonary arteries. Mild motion artifact. No focal filling defects. No evidence of significant pulmonary embolus. Mild cardiac enlargement with left ventricular hypertrophy. Ascending thoracic aortic aneurysm measuring 4.2 cm diameter. Minimal scattered aortic and coronary artery calcifications. Mediastinum/Nodes: Esophagus is decompressed. Thyroid gland is unremarkable. Lymphadenopathy demonstrated in the hila and mediastinum. Largest right hilar nodes measure about 1.3 cm in diameter. Largest pretracheal lymph nodes measure about 1.3 cm diameter. Lungs/Pleura:  Loculated appearing pleural collection in the right posterior lower chest. Severe emphysematous changes in the lungs. Mild peripheral fibrosis. Focal infiltrates in the right lung base posteriorly may represent pneumonia. Scattered pulmonary nodules, most prominent in the right apex, series 5, image 22, measuring 7 mm diameter. Small left pleural effusion with basilar atelectasis or infiltration. Upper Abdomen: No acute abnormalities. Diffuse atrophy of the left kidney. Musculoskeletal: Degenerative changes in the spine. No acute bony abnormalities. Old Hill-Sachs deformity of the right humerus. Review of the MIP images confirms the above findings. IMPRESSION: 1. No evidence of significant pulmonary embolus. 2. Cardiac enlargement with left ventricular hypertrophy. 3. Probable loculated collection in the right pleural space, possibly related to infection. Infiltrates in the left 4. Small left pleural effusion with basilar atelectasis. 5. Diffuse emphysematous changes throughout the lungs with subpleural fibrosis. 6. Multiple pulmonary nodules. Most significant: Right solid pulmonary nodule within the upper lobe measuring 7 mm. Per Fleischner Society Guidelines, recommend a non-contrast Chest CT at 3-6 months, then another non-contrast Chest CT at 18-24 months. These guidelines do not apply to immunocompromised patients and patients with cancer. Follow up in patients with significant comorbidities as clinically  warranted. For lung cancer screening, adhere to Lung-RADS guidelines. Reference: Radiology. 2017; 284(1):228-43. 7. Hilar and mediastinal lymphadenopathy is nonspecific and could represent reactive change versus lymphoproliferative process. Similar appearance to previous study. 8. 4.2 cm diameter ascending thoracic aortic aneurysm. Recommend annual imaging followup by CTA or MRA. This recommendation follows 2010 ACCF/AHA/AATS/ACR/ASA/SCA/SCAI/SIR/STS/SVM Guidelines for the Diagnosis and Management of Patients  with Thoracic Aortic Disease. Circulation. 2010; 121: X914-N829. Aortic aneurysm NOS (ICD10-I71.9) 9. Aortic atherosclerosis. Electronically Signed   By: Burman Nieves M.D.   On: 11/26/2023 19:58   DG Shoulder Right Result Date: 11/26/2023 CLINICAL DATA:  Larey Seat, pain EXAM: RIGHT SHOULDER - 2+ VIEW COMPARISON:  11/26/2023, 07/18/2023 FINDINGS: Internal rotation, external rotation, transscapular views of the right shoulder are obtained. No acute fracture, subluxation, or dislocation. Mild acromioclavicular and glenohumeral joint osteoarthritis. Soft tissue swelling overlying the right humeral head. Stable emphysema with small right pleural effusion versus pleural thickening. IMPRESSION: 1. Osteoarthritis.  No acute displaced fracture. 2. Emphysema, with stable right pleural thickening versus small pleural effusion. Electronically Signed   By: Sharlet Salina M.D.   On: 11/26/2023 18:48   CT Head Wo Contrast Result Date: 11/26/2023 CLINICAL DATA:  Mental status change of unknown cause. Unresponsive. Found on the floor with vomiting. EXAM: CT HEAD WITHOUT CONTRAST TECHNIQUE: Contiguous axial images were obtained from the base of the skull through the vertex without intravenous contrast. RADIATION DOSE REDUCTION: This exam was performed according to the departmental dose-optimization program which includes automated exposure control, adjustment of the mA and/or kV according to patient size and/or use of iterative reconstruction technique. COMPARISON:  None Available. FINDINGS: Brain: No evidence of acute infarction, hemorrhage, hydrocephalus, extra-axial collection or mass lesion/mass effect. Mild cerebral atrophy. Vascular: No hyperdense vessel or unexpected calcification. Skull: Normal. Negative for fracture or focal lesion. Sinuses/Orbits: Paranasal sinuses are clear. Bilateral mastoid effusions. Old nasal bone fracture deformities. Other: None. IMPRESSION: 1. No acute intracranial abnormalities. 2. Bilateral  mastoid effusions. Electronically Signed   By: Burman Nieves M.D.   On: 11/26/2023 18:31   DG Chest Port 1 View Result Date: 11/26/2023 CLINICAL DATA:  Syncope.  Altered mental status. EXAM: PORTABLE CHEST 1 VIEW COMPARISON:  03/26/2023. FINDINGS: Low lung volume. Redemonstration of homogeneous opacity along bilateral lower lateral lung zones, which may represent pleural thickening versus loculated pleural effusions. Bilateral lung fields are otherwise clear. No dense consolidation or lung collapse. No frank pulmonary edema. Bilateral costophrenic angles are clear. Stable cardio-mediastinal silhouette. No acute osseous abnormalities. The soft tissues are within normal limits. IMPRESSION: No active disease. Electronically Signed   By: Jules Schick M.D.   On: 11/26/2023 18:01   PROCEDURES: Critical Care performed: Yes, see critical care procedure note(s) .1-3 Lead EKG Interpretation  Performed by: Merwyn Katos, MD Authorized by: Merwyn Katos, MD     Interpretation: normal     ECG rate:  71   ECG rate assessment: normal     Rhythm: sinus rhythm     Ectopy: none     Conduction: normal   Comments:     [facility] CRITICAL CARE Performed by: Merwyn Katos  Total critical care time: 35 minutes  Critical care time was exclusive of separately billable procedures and treating other patients.  Critical care was necessary to treat or prevent imminent or life-threatening deterioration.  Critical care was time spent personally by me on the following activities: development of treatment plan with patient and/or surrogate as well as nursing, discussions with consultants, evaluation of patient's  response to treatment, examination of patient, obtaining history from patient or surrogate, ordering and performing treatments and interventions, ordering and review of laboratory studies, ordering and review of radiographic studies, pulse oximetry and re-evaluation of patient's condition.  MEDICATIONS  ORDERED IN ED: Medications  cefTRIAXone (ROCEPHIN) 1 g in sodium chloride 0.9 % 100 mL IVPB (1 g Intravenous New Bag/Given 11/26/23 2053)  azithromycin (ZITHROMAX) 500 mg in sodium chloride 0.9 % 250 mL IVPB (has no administration in time range)  ondansetron (ZOFRAN) injection 4 mg (4 mg Intravenous Given 11/26/23 1729)  sodium chloride 0.9 % bolus 1,000 mL (0 mLs Intravenous Stopped 11/26/23 1947)  ibuprofen (ADVIL) tablet 600 mg (600 mg Oral Given 11/26/23 1958)  iohexol (OMNIPAQUE) 350 MG/ML injection 75 mL (75 mLs Intravenous Contrast Given 11/26/23 1924)   IMPRESSION / MDM / ASSESSMENT AND PLAN / ED COURSE  I reviewed the triage vital signs and the nursing notes.                             The patient is on the cardiac monitor to evaluate for evidence of arrhythmia and/or significant heart rate changes. Patient's presentation is most consistent with acute presentation with potential threat to life or bodily function. Patient presents with complaints of syncope/presyncope ED Workup:  CBC, BMP, Troponin, BNP, ECG, CXR Differential diagnosis includes HF, ICH, seizure, stroke, HOCM, ACS, aortic dissection, malignant arrhythmia, or GI bleed. Findings: No evidence of acute laboratory abnormalities.  Troponin negative x1 EKG: No e/o STEMI. No evidence of Brugadas sign, delta wave, epsilon wave, significantly prolonged QTc, or malignant arrhythmia. Patient also shows signs of pneumonia and will be treated empirically.  Given patient's persistent need for supplemental oxygenation, he will be admitted Disposition: Admit to medicine, telemetry bed for cardiac monitoring and cardiology review.   FINAL CLINICAL IMPRESSION(S) / ED DIAGNOSES   Final diagnoses:  Syncope, unspecified syncope type  Sepsis with acute hypoxic respiratory failure without septic shock, due to unspecified organism [facility])  Pneumonia of right lower lobe due to infectious organism   Rx / DC Orders   ED Discharge Orders      None      Note:  This document was prepared using Dragon voice recognition software and may include unintentional dictation errors.   Merwyn Katos, MD 11/26/23 2100

## 2023-11-26 NOTE — ED Notes (Addendum)
 Pt currently vomitting

## 2023-11-26 NOTE — ED Notes (Signed)
 Pt staing pain in shoulder has gotten worse, now asking for pain meds. Dr Vicente Males informed.

## 2023-11-26 NOTE — ED Notes (Signed)
 Pt medicated per [facility] for pain. Pt urinates utilizing urinal.

## 2023-11-27 ENCOUNTER — Inpatient Hospital Stay: Payer: Medicare Other

## 2023-11-27 ENCOUNTER — Inpatient Hospital Stay
Admit: 2023-11-27 | Discharge: 2023-11-27 | Disposition: A | Discharge status: Home / Self Care | Payer: Medicare Other | Attending: Student | Admitting: Student

## 2023-11-27 ENCOUNTER — Encounter: Payer: Self-pay | Admitting: Radiology

## 2023-11-27 DIAGNOSIS — J9601 Acute respiratory failure with hypoxia: Secondary | ICD-10-CM

## 2023-11-27 DIAGNOSIS — R569 Unspecified convulsions: Secondary | ICD-10-CM

## 2023-11-27 DIAGNOSIS — K219 Gastro-esophageal reflux disease without esophagitis: Secondary | ICD-10-CM | POA: Insufficient documentation

## 2023-11-27 DIAGNOSIS — N179 Acute kidney failure, unspecified: Secondary | ICD-10-CM

## 2023-11-27 DIAGNOSIS — R55 Syncope and collapse: Secondary | ICD-10-CM | POA: Diagnosis not present

## 2023-11-27 DIAGNOSIS — A419 Sepsis, unspecified organism: Secondary | ICD-10-CM | POA: Diagnosis not present

## 2023-11-27 DIAGNOSIS — J189 Pneumonia, unspecified organism: Secondary | ICD-10-CM | POA: Diagnosis not present

## 2023-11-27 DIAGNOSIS — E785 Hyperlipidemia, unspecified: Secondary | ICD-10-CM | POA: Insufficient documentation

## 2023-11-27 DIAGNOSIS — E876 Hypokalemia: Secondary | ICD-10-CM

## 2023-11-27 DIAGNOSIS — K589 Irritable bowel syndrome without diarrhea: Secondary | ICD-10-CM | POA: Insufficient documentation

## 2023-11-27 DIAGNOSIS — F32A Depression, unspecified: Secondary | ICD-10-CM | POA: Insufficient documentation

## 2023-11-27 LAB — LIPASE, BLOOD: Lipase: 27 U/L (ref 11–51)

## 2023-11-27 LAB — BASIC METABOLIC PANEL
Anion gap: 7 (ref 5–15)
BUN: 13 mg/dL (ref 8–23)
CO2: 19 mmol/L — ABNORMAL LOW (ref 22–32)
Calcium: 8.4 mg/dL — ABNORMAL LOW (ref 8.9–10.3)
Chloride: 112 mmol/L — ABNORMAL HIGH (ref 98–111)
Creatinine, Ser: 1.03 mg/dL (ref 0.61–1.24)
GFR, Estimated: >60 mL/min (ref >60)
Glucose, Bld: 125 mg/dL — ABNORMAL HIGH (ref 70–99)
Potassium: 5 mmol/L (ref 3.5–5.1)
Sodium: 138 mmol/L (ref 135–145)

## 2023-11-27 LAB — PROTIME-INR
INR: 1.2 (ref 0.8–1.2)
Prothrombin Time: 15.8 s — ABNORMAL HIGH (ref 11.4–15.2)

## 2023-11-27 LAB — LACTIC ACID, PLASMA: Lactic Acid, Venous: 1.4 mmol/L (ref 0.5–1.9)

## 2023-11-27 LAB — CBC
HCT: 37.7 % — ABNORMAL LOW (ref 39.0–52.0)
Hemoglobin: 12.6 g/dL — ABNORMAL LOW (ref 13.0–17.0)
MCH: 28.1 pg (ref 26.0–34.0)
MCHC: 33.4 g/dL (ref 30.0–36.0)
MCV: 84 fL (ref 80.0–100.0)
Platelets: 236 10*3/uL (ref 150–400)
RBC: 4.49 MIL/uL (ref 4.22–5.81)
RDW: 15 % (ref 11.5–15.5)
WBC: 13 10*3/uL — ABNORMAL HIGH (ref 4.0–10.5)
nRBC: 0 % (ref 0.0–0.2)

## 2023-11-27 LAB — HIV ANTIBODY (ROUTINE TESTING W REFLEX): HIV Screen 4th Generation wRfx: NONREACTIVE

## 2023-11-27 LAB — CK: Total CK: 1700 U/L — ABNORMAL HIGH (ref 49–397)

## 2023-11-27 LAB — TROPONIN I (HIGH SENSITIVITY)
Troponin I (High Sensitivity): 174 ng/L (ref <18)
Troponin I (High Sensitivity): 156 ng/L (ref <18)

## 2023-11-27 LAB — CORTISOL-AM, BLOOD: Cortisol - AM: 15 ug/dL (ref 6.7–22.6)

## 2023-11-27 LAB — MAGNESIUM: Magnesium: 1.6 mg/dL — ABNORMAL LOW (ref 1.7–2.4)

## 2023-11-27 LAB — PHOSPHORUS: Phosphorus: 2.7 mg/dL (ref 2.5–4.6)

## 2023-11-27 MED ORDER — MORPHINE SULFATE (PF) 2 MG/ML IV SOLN
2.0000 mg | INTRAVENOUS | Status: DC | PRN
  Administered 2023-11-27 (×3): 2 mg via INTRAVENOUS
  Filled 2023-11-27 (×3): qty 1

## 2023-11-27 MED ORDER — LOSARTAN POTASSIUM 50 MG PO TABS
100.0000 mg | ORAL_TABLET | Freq: Every day | ORAL | Status: DC
  Administered 2023-11-27: 100 mg via ORAL
  Filled 2023-11-27: qty 2

## 2023-11-27 MED ORDER — HYDROXYCHLOROQUINE SULFATE 200 MG PO TABS
200.0000 mg | ORAL_TABLET | ORAL | Status: DC
  Administered 2023-11-30 – 2023-12-01 (×2): 200 mg via ORAL
  Filled 2023-11-27 (×2): qty 1

## 2023-11-27 MED ORDER — HYDROCHLOROTHIAZIDE 25 MG PO TABS
25.0000 mg | ORAL_TABLET | Freq: Every day | ORAL | Status: DC
  Filled 2023-11-27: qty 1

## 2023-11-27 MED ORDER — LORAZEPAM 2 MG/ML IJ SOLN: 1.0000 mg | INTRAMUSCULAR | Status: DC | PRN

## 2023-11-27 MED ORDER — MAGNESIUM SULFATE 2 GM/50ML IV SOLN
2.0000 g | Freq: Once | INTRAVENOUS | Status: AC
  Administered 2023-11-27: 2 g via INTRAVENOUS
  Filled 2023-11-27: qty 50

## 2023-11-27 MED ORDER — SODIUM CHLORIDE 0.9 % IV SOLN: INTRAVENOUS | Status: DC

## 2023-11-27 MED ORDER — SODIUM BICARBONATE 650 MG PO TABS
650.0000 mg | ORAL_TABLET | Freq: Three times a day (TID) | ORAL | Status: AC
  Administered 2023-11-27 (×2): 650 mg via ORAL
  Filled 2023-11-27 (×2): qty 1

## 2023-11-27 NOTE — Assessment & Plan Note (Addendum)
-   The patient will be admitted to a medical telemetry bed. - Sepsis manifested by leukocytosis and tachypnea. - Will continue antibiotic therapy with IV Rocephin and Zithromax. - Mucolytic therapy be provided as well as duo nebs q.i.d. and q.4 hours p.r.n. - We will follow blood cultures. -The patient be hydrated with IV ringer. - This is likely the reason for his acute respiratory failure with hypoxia.

## 2023-11-27 NOTE — TOC Initial Note (Signed)
 Transition of Care [facility]) - Initial/Assessment Note    Patient Details  Name: Jose Cuevas MRN: [id number] Date of Birth: [birth year]  Transition of Care [facility]) CM/SW Contact:    Marlowe Sax, RN Phone Number: 11/27/2023, 2:47 PM  Clinical Narrative:                  Transition of Care [facility]) Screening Note   Patient Details  Name: Jose Cuevas Date of Birth: 01-23-1961   Transition of Care [facility]) CM/SW Contact:    Marlowe Sax, RN Phone Number: 11/27/2023, 2:47 PM    Transition of Care Department [facility]) has reviewed patient and no TOC needs have been identified at this time. We will continue to monitor patient advancement through interdisciplinary progression rounds. If new patient transition needs arise, please place a TOC consult.          Patient Goals and CMS Choice            Expected Discharge Plan and Services                                              Prior Living Arrangements/Services                       Activities of Daily Living   ADL Screening (condition at time of admission) Independently performs ADLs?: Yes (appropriate for developmental age) Is the patient deaf or have difficulty hearing?: Yes Does the patient have difficulty seeing, even when wearing glasses/contacts?: No Does the patient have difficulty concentrating, remembering, or making decisions?: No  Permission Sought/Granted                  Emotional Assessment              Admission diagnosis:  Sepsis due to pneumonia (HCC) [J18.9, A41.9] Syncope, unspecified syncope type [R55] Pneumonia of right lower lobe due to infectious organism [J18.9] Sepsis with acute hypoxic respiratory failure without septic shock, due to unspecified organism (HCC) [A41.9, R65.20, J96.01] Patient Active Problem List   Diagnosis Date Noted   New onset seizure (HCC) 11/27/2023   IBS (irritable bowel syndrome) 11/27/2023   AKI (acute kidney injury) (HCC)  11/27/2023   Acute respiratory failure with hypoxia (HCC) 11/27/2023   Dyslipidemia 11/27/2023   Anxiety and depression 11/27/2023   GERD without esophagitis 11/27/2023   Hypokalemia 11/27/2023   Sepsis due to pneumonia (HCC) 11/26/2023   Left carotid artery stenosis 08/30/2023   Other insomnia 08/30/2023   Moderate episode of recurrent major depressive disorder (HCC) 08/30/2023   Centrilobular emphysema (HCC) 08/12/2023   Essential hypertension 03/12/2021   Chronic fatigue and malaise 03/12/2021   Tobacco dependence with current use 03/12/2021   B12 deficiency 03/12/2021   Vitamin D deficiency 03/12/2021   Multiple pulmonary nodules determined by computed tomography of lung 05/28/2020   Atrophic kidney, acquired 05/28/2020   Renal calculus, left 05/28/2020   Aortic atherosclerosis (HCC) 05/28/2020   Poison oak dermatitis 05/28/2020   Encounter for general adult medical examination with abnormal findings 04/22/2018   Tick bite of abdominal wall 04/22/2018   Candida rash of groin 04/22/2018   PAD (peripheral artery disease) (HCC) 01/30/2018   Back pain 04/21/2014   Rheumatoid arthritis with rheumatoid factor (HCC) 04/21/2014   PCP:  Sallyanne Kuster, NP Pharmacy:   [facility] [phone] -  [place], [place] - [address] [place] [address] Phone: [phone] Fax: [phone]     Social Drivers of Health (SDOH) Social History: SDOH Screenings   Food Insecurity: No Food Insecurity (11/27/2023)  Housing: Low Risk  (11/27/2023)  Transportation Needs: No Transportation Needs (11/27/2023)  Utilities: Not At Risk (11/27/2023)  Alcohol Screen: Low Risk  (03/23/2022)  Depression (PHQ2-9): Low Risk  (07/11/2023)  Social Connections: Socially Isolated (11/27/2023)  Tobacco Use: High Risk (10/28/2023)   Received from [facility]   SDOH Interventions:     Readmission Risk Interventions     No data to display

## 2023-11-27 NOTE — H&P (Addendum)
 [place]   PATIENT NAME: Jose Cuevas    MR#:  [id number]  DATE OF BIRTH:  [birth year]  DATE OF ADMISSION:  11/26/2023  PRIMARY CARE PHYSICIAN: Sallyanne Kuster, NP   Patient is coming from: Home  REQUESTING/REFERRING PHYSICIAN: Donna Bernard, MD  CHIEF COMPLAINT:   Chief Complaint  Patient presents with   unresponsive    HISTORY OF PRESENT ILLNESS:  Jose Cuevas is a 63 y.o. Caucasian male with medical history significant for hypertension, dyslipidemia, gastroparesis, rheumatoid arthritis, OSA and DDD, who presented to the emergency room with acute onset of tonic-clonic seizure witnessed by his family with postictal confusion and lack of memory about the event.  The patient later had nausea and vomiting.  Did not any fever or chills.  No chest pain or palpitations.  He had right shoulder pain when he fell during his seizure.  The patient denies any significant cough or wheezing or dyspnea.  No paresthesias or focal muscle weakness.  He had no urinary or stool incontinence.  No tongue bites.  No dysuria, oliguria or hematuria or flank pain.  ED Course: When the patient came to the ER, BP was 151/84 with pulse oximetry of 98% on 4 L of O2 by nasal cannula and later respiratory rate was 30 and otherwise normal vital signs.  Labs revealed hypokalemia 3.1 and creatinine 1.33 above previous levels.  High-sensitivity troponin I was 54 and later 125.  Lactic acid was 4.4 and later 2.5.  CBC showed leukocytosis 21.6 with neutrophilia. EKG as reviewed by me : EKG showed sinus rhythm with rate of 92 and left bundle branch block. Imaging: Right shoulder x-ray showed osteoarthritis with no acute displaced fracture.  It showed emphysema with stable right pleural thickening versus small pleural effusion.  Portable chest x-ray showed no acute cardiopulmonary disease. Chest CTA revealed the following: 1. No evidence of significant pulmonary embolus. 2. Cardiac enlargement with left ventricular  hypertrophy. 3. Probable loculated collection in the right pleural space, possibly related to infection. Infiltrates in the left 4. Small left pleural effusion with basilar atelectasis. 5. Diffuse emphysematous changes throughout the lungs with subpleural fibrosis. 6. Multiple pulmonary nodules. Most significant: Right solid pulmonary nodule within the upper lobe measuring 7 mm. Per Fleischner Society Guidelines, recommend a non-contrast Chest CT at 3-6 months, then another non-contrast Chest CT at 18-24 months. These guidelines do not apply to immunocompromised patients and patients with cancer. Follow up in patients with significant comorbidities as clinically warranted. For lung cancer screening, adhere to Lung-RADS guidelines. Reference: Radiology. 2017; 284(1):228-43. 7. Hilar and mediastinal lymphadenopathy is nonspecific and could represent reactive change versus lymphoproliferative process. Similar appearance to previous study. 8. 4.2 cm diameter ascending thoracic aortic aneurysm. Recommend annual imaging followup by CTA or MRA. This recommendation follows 2010 ACCF/AHA/AATS/ACR/ASA/SCA/SCAI/SIR/STS/SVM Guidelines for the Diagnosis and Management of Patients with Thoracic Aortic Disease. Circulation. 2010; 121: X528-U132. Aortic aneurysm NOS (ICD10-I71.9) 9. Aortic atherosclerosis.  Noncontrasted head CT scan revealed no acute intracranial normalities.  It showed bilateral mastoid effusions.  The patient was given IV Rocephin and Zithromax, p.o. ibuprofen, 4 mg of IV Zofran, 40 mill [place] of p.o. potassium chloride and 1 L bolus of IV normal saline.  He will be admitted to a medical telemetry bed for further evaluation and management. PAST MEDICAL HISTORY:   Past Medical History:  Diagnosis Date   Back pain    Collagen vascular disease (HCC)    Rhematoid Arthritis hx.   Constipation, chronic  DDD (degenerative disc disease), cervical    History of duodenal ulcer     History of kidney stones    Hyperlipidemia    Hypertension    Low kidney function    LT Kidney non-functioning   Nondiabetic gastroparesis    Peripheral blood vessel disorder (HCC)    Rheumatoid arthritis with rheumatoid factor (HCC)    Sleep apnea    Weight loss     PAST SURGICAL HISTORY:   Past Surgical History:  Procedure Laterality Date   BACK SURGERY     COLONOSCOPY WITH PROPOFOL N/A 11/11/2015   Procedure: COLONOSCOPY WITH PROPOFOL;  Surgeon: Elnita Maxwell, MD;  Location: [facility] ENDOSCOPY;  Service: Endoscopy;  Laterality: N/A;   COLONOSCOPY WITH PROPOFOL N/A 12/02/2015   Procedure: COLONOSCOPY WITH PROPOFOL;  Surgeon: Elnita Maxwell, MD;  Location: [facility] ENDOSCOPY;  Service: Endoscopy;  Laterality: N/A;   COLONOSCOPY WITH PROPOFOL N/A 09/16/2019   Procedure: COLONOSCOPY WITH PROPOFOL;  Surgeon: Toledo, Boykin Nearing, MD;  Location: [facility] ENDOSCOPY;  Service: Gastroenterology;  Laterality: N/A;   ESOPHAGOGASTRODUODENOSCOPY (EGD) WITH PROPOFOL N/A 11/11/2015   Procedure: ESOPHAGOGASTRODUODENOSCOPY (EGD) WITH PROPOFOL;  Surgeon: Elnita Maxwell, MD;  Location: [facility] ENDOSCOPY;  Service: Endoscopy;  Laterality: N/A;   ESOPHAGOGASTRODUODENOSCOPY (EGD) WITH PROPOFOL N/A 12/02/2015   Procedure: ESOPHAGOGASTRODUODENOSCOPY (EGD) WITH PROPOFOL;  Surgeon: Elnita Maxwell, MD;  Location: [facility] ENDOSCOPY;  Service: Endoscopy;  Laterality: N/A;   ESOPHAGOGASTRODUODENOSCOPY (EGD) WITH PROPOFOL N/A 01/20/2016   Procedure: ESOPHAGOGASTRODUODENOSCOPY (EGD) WITH PROPOFOL;  Surgeon: Elnita Maxwell, MD;  Location: [facility] ENDOSCOPY;  Service: Endoscopy;  Laterality: N/A;   ESOPHAGOGASTRODUODENOSCOPY (EGD) WITH PROPOFOL N/A 09/16/2019   Procedure: ESOPHAGOGASTRODUODENOSCOPY (EGD) WITH PROPOFOL;  Surgeon: Toledo, Boykin Nearing, MD;  Location: [facility] ENDOSCOPY;  Service: Gastroenterology;  Laterality: N/A;   KNEE RECONSTRUCTION Left     SOCIAL HISTORY:   Social History   Tobacco Use   Smoking  status: Some Days    Types: Cigars   Smokeless tobacco: Never   Tobacco comments:    Occasional  3 a day  Substance Use Topics   Alcohol use: No    FAMILY HISTORY:   Family History  Problem Relation Age of Onset   Diabetes Mother    Diabetes Father    Heart attack Maternal Grandmother    COPD Brother        08/2020   Tourette syndrome Son        in prison 2019   Learning disabilities Son     DRUG ALLERGIES:  No Known Allergies  REVIEW OF SYSTEMS:   ROS As per history of present illness. All pertinent systems were reviewed above. Constitutional, HEENT, cardiovascular, respiratory, GI, GU, musculoskeletal, neuro, psychiatric, endocrine, integumentary and hematologic systems were reviewed and are otherwise negative/unremarkable except for positive findings mentioned above in the HPI.   MEDICATIONS AT HOME:   Prior to Admission medications   Medication Sig Start Date End Date Taking? Authorizing Provider  albuterol (VENTOLIN HFA) 108 (90 Base) MCG/ACT inhaler Inhale 2 puffs into the lungs every 6 (six) hours as needed for wheezing or shortness of breath. 10/25/23   McDonough, Lauren K, PA-C  Aspirin-Caffeine (BC FAST PAIN RELIEF ARTHRITIS PO) Take by mouth.    [provider]  azithromycin (ZITHROMAX) 250 MG tablet Take one tab a day for 10 days for uri 10/25/23   McDonough, Lauren K, PA-C  certolizumab pegol (CIMZIA) 2 X 200 MG KIT Inject 400 mg into the skin every 30 (thirty) days. Done at  rheumatology office.    [provider]  clonazePAM (KLONOPIN) 1 MG tablet Take 1 tablet (1 mg total) by mouth at bedtime as needed for anxiety. 10/10/23   Sallyanne Kuster, NP  DULoxetine (CYMBALTA) 30 MG capsule Take 1 capsule (30 mg total) by mouth at bedtime. 07/11/23   Sallyanne Kuster, NP  Fluticasone-Umeclidin-Vilant (TRELEGY ELLIPTA) 100-62.5-25 MCG/ACT AEPB Inhale 1 puff into the lungs daily. 08/12/23   Sallyanne Kuster, NP  folic acid (FOLVITE) 1 MG tablet Take 1  mg by mouth daily.    [provider]  furosemide (LASIX) 20 MG tablet Take one tab po M/W/F 04/23/23   Lyndon Code, MD  hydroxychloroquine (PLAQUENIL) 200 MG tablet Take one tablet TWICE daily weekdays. Take one tablet ONCE daily weekends. Take with food. 03/14/23   [provider]  levocetirizine (XYZAL) 5 MG tablet TAKE 1 TABLET BY MOUTH EVERY DAY IN THE EVENING 07/11/23   Abernathy, Arlyss Repress, NP  losartan-hydrochlorothiazide (HYZAAR) 100-25 MG tablet Take 1 tablet by mouth daily. 07/11/23   Sallyanne Kuster, NP  lubiprostone (AMITIZA) 24 MCG capsule Take 24 mcg by mouth 2 (two) times daily.    [provider]  methotrexate 50 MG/2ML injection Inject 50 mg/m2 into the vein once. 0.75mls once every 2 weeks    [provider]  metoCLOPramide (REGLAN) 10 MG tablet Take 1 tablet (10 mg total) by mouth 3 (three) times daily before meals. 07/11/23   Sallyanne Kuster, NP  mirtazapine (REMERON) 15 MG tablet Take 1 tablet (15 mg total) by mouth at bedtime. 07/11/23   Sallyanne Kuster, NP  Oxycodone HCl 10 MG TABS Take by mouth. 10/19/20   [provider]  pantoprazole (PROTONIX) 40 MG tablet Take 1 tablet (40 mg total) by mouth 2 (two) times daily. 07/11/23   Sallyanne Kuster, NP  Potassium Chloride CR (MICRO-K) 8 MEQ CPCR capsule CR TAKE ONE TAB PO M/W/F WITH LASIX 04/23/23   Lyndon Code, MD  predniSONE (DELTASONE) 10 MG tablet Take one tab 3 x day for 3 days, then take one tab 2 x a day for 3 days and then take one tab a day for 3 days for copd 10/25/23   McDonough, Lauren K, PA-C  rosuvastatin (CRESTOR) 5 MG tablet TAKE 1 TABLET (5 MG TOTAL) BY MOUTH DAILY. 09/23/23   Sallyanne Kuster, NP  tamsulosin (FLOMAX) 0.4 MG CAPS capsule Take 1 capsule (0.4 mg total) by mouth daily. 01/10/23   Sallyanne Kuster, NP  varenicline (CHANTIX) 1 MG tablet Take 1 tablet (1 mg total) by mouth 2 (two) times daily. 11/19/23   Sallyanne Kuster, NP  vitamin B-12 (CYANOCOBALAMIN)  1000 MCG tablet Take 1,000 mcg by mouth daily.    [provider]      VITAL SIGNS:  Blood pressure 123/84, pulse (!) 103, temperature 98 F (36.7 C), resp. rate 18, height 5\' 9"  (1.753 m), weight 68 kg, SpO2 97%.  PHYSICAL EXAMINATION:  Physical Exam  GENERAL:  63 y.o.-year-old Caucasian male patient lying in the bed with no acute distress.  EYES: Pupils equal, round, reactive to light and accommodation. No scleral icterus. Extraocular muscles intact.  HEENT: Head atraumatic, normocephalic. Oropharynx and nasopharynx clear.  NECK:  Supple, no jugular venous distention. No thyroid enlargement, no tenderness.  LUNGS: Diminished bibasal breath sounds with bibasal crackles.. No use of accessory muscles of respiration.  CARDIOVASCULAR: Regular rate and rhythm, S1, S2 normal. No murmurs, rubs, or gallops.  ABDOMEN: Soft, nondistended, nontender. Bowel sounds present. No organomegaly  or mass.  EXTREMITIES: No pedal edema, cyanosis, or clubbing.  NEUROLOGIC: Cranial nerves II through XII are intact. Muscle strength 5/5 in all extremities. Sensation intact. Gait not checked.  PSYCHIATRIC: The patient is alert and oriented x 3.  Normal affect and good eye contact. SKIN: No obvious rash, lesion, or ulcer.   LABORATORY PANEL:   CBC Recent Labs  Lab 11/26/23 1709  WBC 21.6*  HGB 14.5  HCT 44.6  PLT 307   ------------------------------------------------------------------------------------------------------------------  Chemistries  Recent Labs  Lab 11/26/23 1709  NA 139  K 3.1*  CL 109  CO2 17*  GLUCOSE 138*  BUN 12  CREATININE 1.33*  CALCIUM 9.0  AST 41  ALT 35  ALKPHOS 93  BILITOT 0.5   ------------------------------------------------------------------------------------------------------------------  Cardiac Enzymes No results for input(s): "TROPONINI" in the last 168  hours. ------------------------------------------------------------------------------------------------------------------  RADIOLOGY:  CT Angio Chest PE W/Cm &/Or Wo Cm Result Date: 11/26/2023 CLINICAL DATA:  Pulmonary embolus suspected with high probability. Patient was found on the floor with vomit and unresponsive. EXAM: CT ANGIOGRAPHY CHEST WITH CONTRAST TECHNIQUE: Multidetector CT imaging of the chest was performed using the standard protocol during bolus administration of intravenous contrast. Multiplanar CT image reconstructions and MIPs were obtained to evaluate the vascular anatomy. RADIATION DOSE REDUCTION: This exam was performed according to the departmental dose-optimization program which includes automated exposure control, adjustment of the mA and/or kV according to patient size and/or use of iterative reconstruction technique. CONTRAST:  75mL OMNIPAQUE IOHEXOL 350 MG/ML SOLN COMPARISON:  Chest CT 07/18/2023.  Chest radiograph 11/26/2023 FINDINGS: Cardiovascular: Technically adequate study with good opacification of the central and segmental pulmonary arteries. Mild motion artifact. No focal filling defects. No evidence of significant pulmonary embolus. Mild cardiac enlargement with left ventricular hypertrophy. Ascending thoracic aortic aneurysm measuring 4.2 cm diameter. Minimal scattered aortic and coronary artery calcifications. Mediastinum/Nodes: Esophagus is decompressed. Thyroid gland is unremarkable. Lymphadenopathy demonstrated in the hila and mediastinum. Largest right hilar nodes measure about 1.3 cm in diameter. Largest pretracheal lymph nodes measure about 1.3 cm diameter. Lungs/Pleura: Loculated appearing pleural collection in the right posterior lower chest. Severe emphysematous changes in the lungs. Mild peripheral fibrosis. Focal infiltrates in the right lung base posteriorly may represent pneumonia. Scattered pulmonary nodules, most prominent in the right apex, series 5, image  22, measuring 7 mm diameter. Small left pleural effusion with basilar atelectasis or infiltration. Upper Abdomen: No acute abnormalities. Diffuse atrophy of the left kidney. Musculoskeletal: Degenerative changes in the spine. No acute bony abnormalities. [place]-Sachs deformity of the right humerus. Review of the MIP images confirms the above findings. IMPRESSION: 1. No evidence of significant pulmonary embolus. 2. Cardiac enlargement with left ventricular hypertrophy. 3. Probable loculated collection in the right pleural space, possibly related to infection. Infiltrates in the left 4. Small left pleural effusion with basilar atelectasis. 5. Diffuse emphysematous changes throughout the lungs with subpleural fibrosis. 6. Multiple pulmonary nodules. Most significant: Right solid pulmonary nodule within the upper lobe measuring 7 mm. Per Fleischner Society Guidelines, recommend a non-contrast Chest CT at 3-6 months, then another non-contrast Chest CT at 18-24 months. These guidelines do not apply to immunocompromised patients and patients with cancer. Follow up in patients with significant comorbidities as clinically warranted. For lung cancer screening, adhere to Lung-RADS guidelines. Reference: Radiology. 2017; 284(1):228-43. 7. Hilar and mediastinal lymphadenopathy is nonspecific and could represent reactive change versus lymphoproliferative process. Similar appearance to previous study. 8. 4.2 cm diameter ascending thoracic aortic aneurysm. Recommend annual imaging followup by  CTA or MRA. This recommendation follows 2010 ACCF/AHA/AATS/ACR/ASA/SCA/SCAI/SIR/STS/SVM Guidelines for the Diagnosis and Management of Patients with Thoracic Aortic Disease. Circulation. 2010; 121: Z610-R604. Aortic aneurysm NOS (ICD10-I71.9) 9. Aortic atherosclerosis. Electronically Signed   By: Burman Nieves M.D.   On: 11/26/2023 19:58   DG Shoulder Right Result Date: 11/26/2023 CLINICAL DATA:  Larey Seat, pain EXAM: RIGHT SHOULDER - 2+  VIEW COMPARISON:  11/26/2023, 07/18/2023 FINDINGS: Internal rotation, external rotation, transscapular views of the right shoulder are obtained. No acute fracture, subluxation, or dislocation. Mild acromioclavicular and glenohumeral joint osteoarthritis. Soft tissue swelling overlying the right humeral head. Stable emphysema with small right pleural effusion versus pleural thickening. IMPRESSION: 1. Osteoarthritis.  No acute displaced fracture. 2. Emphysema, with stable right pleural thickening versus small pleural effusion. Electronically Signed   By: Sharlet Salina M.D.   On: 11/26/2023 18:48   CT Head Wo Contrast Result Date: 11/26/2023 CLINICAL DATA:  Mental status change of unknown cause. Unresponsive. Found on the floor with vomiting. EXAM: CT HEAD WITHOUT CONTRAST TECHNIQUE: Contiguous axial images were obtained from the base of the skull through the vertex without intravenous contrast. RADIATION DOSE REDUCTION: This exam was performed according to the departmental dose-optimization program which includes automated exposure control, adjustment of the mA and/or kV according to patient size and/or use of iterative reconstruction technique. COMPARISON:  None Available. FINDINGS: Brain: No evidence of acute infarction, hemorrhage, hydrocephalus, extra-axial collection or mass lesion/mass effect. Mild cerebral atrophy. Vascular: No hyperdense vessel or unexpected calcification. Skull: Normal. Negative for fracture or focal lesion. Sinuses/Orbits: Paranasal sinuses are clear. Bilateral mastoid effusions. Old nasal bone fracture deformities. Other: None. IMPRESSION: 1. No acute intracranial abnormalities. 2. Bilateral mastoid effusions. Electronically Signed   By: Burman Nieves M.D.   On: 11/26/2023 18:31   DG Chest Port 1 View Result Date: 11/26/2023 CLINICAL DATA:  Syncope.  Altered mental status. EXAM: PORTABLE CHEST 1 VIEW COMPARISON:  03/26/2023. FINDINGS: Low lung volume. Redemonstration of  homogeneous opacity along bilateral lower lateral lung zones, which may represent pleural thickening versus loculated pleural effusions. Bilateral lung fields are otherwise clear. No dense consolidation or lung collapse. No frank pulmonary edema. Bilateral costophrenic angles are clear. Stable cardio-mediastinal silhouette. No acute osseous abnormalities. The soft tissues are within normal limits. IMPRESSION: No active disease. Electronically Signed   By: Jules Schick M.D.   On: 11/26/2023 18:01      IMPRESSION AND PLAN:  Assessment and Plan: * Sepsis due to pneumonia [facility]) - The patient will be admitted to a medical telemetry bed. - Sepsis manifested by leukocytosis and tachypnea. - Will continue antibiotic therapy with IV Rocephin and Zithromax. - Mucolytic therapy be provided as well as duo nebs q.i.d. and q.4 hours p.r.n. - We will follow blood cultures. -The patient be hydrated with IV ringer. - This is likely the reason for his acute respiratory failure with hypoxia.  New onset seizure [facility]) - The will be placed on seizure precautions. - We will obtain an EEG. - Neurology consult to be obtained in a.m. - I notified Dr. Selina Cooley about the patient. - We will place him on as needed IV Ativan for now.  Acute respiratory failure with hypoxia (HCC) - This is secondary to #1. - Management as above. - O2 protocol will be followed.  Hypokalemia - We will replace potassium and check magnesium level.  AKI (acute kidney injury) (HCC) - The patient will be hydrated with IV lactated ringer. - Will follow BMP. - We will avoid nephrotoxins.  Centrilobular emphysema (HCC) - The patient be placed on scheduled and as needed DuoNebs. - Mucolytic therapy will be provided.  GERD without esophagitis - We will continue PPI therapy.  Anxiety and depression - We will continue Klonopin, Remeron and Cymbalta.  Dyslipidemia - We will continue therapy.  IBS (irritable bowel syndrome) - We  will continue Amitiza.  Essential hypertension Will hold off ARB therapy given his AKI. - BP has been fairly controlled.  Rheumatoid arthritis with rheumatoid factor (HCC) - We will continue methotrexate.       DVT prophylaxis: Lovenox.  Advanced Care Planning:  Code Status: full code.  Family Communication:  The plan of care was discussed in details with the patient (and family). I answered all questions. The patient agreed to proceed with the above mentioned plan. Further management will depend upon hospital course. Disposition Plan: Back to previous home environment Consults called: Neurology. All the records are reviewed and case discussed with ED provider.  Status is: Inpatient   At the time of the admission, it appears that the appropriate admission status for this patient is inpatient.  This is judged to be reasonable and necessary in order to provide the required intensity of service to ensure the patient's safety given the presenting symptoms, physical exam findings and initial radiographic and laboratory data in the context of comorbid conditions.  The patient requires inpatient status due to high intensity of service, high risk of further deterioration and high frequency of surveillance required.  I certify that at the time of admission, it is my clinical judgment that the patient will require inpatient hospital care extending more than 2 midnights.                            Dispo: The patient is from: Home              Anticipated d/c is to: Home              Patient currently is not medically stable to d/c.              Difficult to place patient: No  Hannah Beat M.D on 11/27/2023 at 3:26 AM  [facility]   From 7 PM-7 AM, contact night-coverage [email or web address]  CC: Primary care physician; Sallyanne Kuster, NP

## 2023-11-27 NOTE — Progress Notes (Addendum)
 [facility] Progress Note  Patient: Jose Cuevas    OZH:[id number]  DOA: 11/26/2023     Date of Service: the patient was seen and examined on 11/27/2023  Chief Complaint  Patient presents with   unresponsive   Brief hospital course: Jose Cuevas is a 63 y.o. Caucasian male with medical history significant for hypertension, dyslipidemia, gastroparesis, rheumatoid arthritis, OSA and DDD, who presented to the emergency room with acute onset of tonic-clonic seizure witnessed by his family with postictal confusion and lack of memory about the event.  The patient later had nausea and vomiting.  Did not any fever or chills.  No chest pain or palpitations.  He had right shoulder pain when he fell during his seizure.  The patient denies any significant cough or wheezing or dyspnea.  No paresthesias or focal muscle weakness.  He had no urinary or stool incontinence.  No tongue bites.  No dysuria, oliguria or hematuria or flank pain.   ED Course: When the patient came to the ER, BP was 151/84 with pulse oximetry of 98% on 4 L of O2 by nasal cannula and later respiratory rate was 30 and otherwise normal vital signs.  Labs revealed hypokalemia 3.1 and creatinine 1.33 above previous levels.  High-sensitivity troponin I was 54 and later 125.  Lactic acid was 4.4 and later 2.5.  CBC showed leukocytosis 21.6 with neutrophilia. EKG as reviewed by me : EKG showed sinus rhythm with rate of 92 and left bundle branch block. Imaging: Right shoulder x-ray showed osteoarthritis with no acute displaced fracture.  It showed emphysema with stable right pleural thickening versus small pleural effusion.  Portable chest x-ray showed no acute cardiopulmonary disease. Chest CTA revealed the following: 1. No evidence of significant pulmonary embolus. 2. Cardiac enlargement with left ventricular hypertrophy. 3. Probable loculated collection in the right pleural space, possibly related to infection. Infiltrates in the left 4. Small  left pleural effusion with basilar atelectasis. 5. Diffuse emphysematous changes throughout the lungs with subpleural fibrosis. 6. Multiple pulmonary nodules. Most significant: Right solid pulmonary nodule within the upper lobe measuring 7 mm. Per Fleischner Society Guidelines, recommend a non-contrast Chest CT at 3-6 months, then another non-contrast Chest CT at 18-24 months. 7. Hilar and mediastinal lymphadenopathy is nonspecific and could represent reactive change versus lymphoproliferative process. Similar appearance to previous study. 8. 4.2 cm diameter ascending thoracic aortic aneurysm. Recommend annual imaging followup by CTA or MRA. 9. Aortic atherosclerosis.   Noncontrasted head CT scan revealed no acute intracranial normalities.  It showed bilateral mastoid effusions.  The patient was given IV Rocephin and Zithromax, p.o. ibuprofen, 4 mg of IV Zofran, 40 mill Cabbell of p.o. potassium chloride and 1 L bolus of IV normal saline.  He will be admitted to a medical telemetry bed for further evaluation and management.   Assessment and Plan:  * Sepsis due to pneumonia  Acute respiratory failure with hypoxia due to PNA - Sepsis manifested by leukocytosis and tachypnea. - Continue IV Rocephin and Zithromax. - Mucolytic therapy be provided as well as duo nebs q.i.d. and q.4 hours p.r.n. - We will follow blood cultures. -The patient be hydrated with IV ringer. - This is likely the reason for his acute respiratory failure with hypoxia.   New onset seizure (HCC) - Continue seizure precautions. - EEG negative - Neurology consulted, recommended MRI brain and C-spine -Continue prn IV Ativan for now.   Elevated troponin, most likely demand ischemia Trop peaked 174 Patient denies any  chest pain Follow 2D echocardiogram  Rhabdomyolysis most likely due to seizures CK17 100 Continue IV fluid for hydration Trend CK level  Hypokalemia, potassium repleted Hypomagnesemia, mag  repleted. Monitor electrolytes and replete as needed.    AKI (acute kidney injury) resolved Renal functions improved after IV fluid Monitor renal functions daily Discontinued Lasix and hydrochlorothiazide for now    Centrilobular emphysema (HCC) - The patient be placed on scheduled and as needed DuoNebs. - Mucolytic therapy will be provided.   GERD without esophagitis - We will continue PPI therapy.   Anxiety and depression - We will continue Klonopin, Remeron and Cymbalta.   Dyslipidemia - Hold the statin for now due to elevated CK level   IBS (irritable bowel syndrome) - We will continue Amitiza.   Essential hypertension Resumed losartan 100 mg p.o. daily with holding parameters Discontinued Lasix and hydrochlorothiazide Monitor BP and titrate medications accordingly  Rheumatoid arthritis with rheumatoid factor (HCC) - We will continue methotrexate.   Pulmonary nodules, incidental finding CT scan: Right solid pulmonary nodule within the upper lobe measuring 7 mm. Per Fleischner Society Guidelines, recommend a non-contrast Chest CT at 3-6 months, then another non-contrast Chest CT at 18-24 months. Recommended to follow with PCP and pulmonary as an outpatient  Ascending aortic aneurysm, incidental finding CT scan: 4.2 cm diameter ascending thoracic aortic aneurysm. Recommend annual imaging followup by CTA or MRA. 9. Aortic atherosclerosis. Follow with PCP as an outpatient  Body mass index is 22.15 kg/m.  Interventions:  Diet: Heart healthy diet DVT Prophylaxis: Subcutaneous Lovenox   Advance goals of care discussion: Full code  Family Communication: family was present at bedside, at the time of interview.  The pt provided permission to discuss medical plan with the family. Opportunity was given to ask question and all questions were answered satisfactorily.   Disposition:  Pt is from Home, admitted with seizures, respiratory failure, pneumonia, still has  failure on IV antibiotics, which precludes a safe discharge. Discharge to SNF, TBD after PT/OT eval, when stable.  Subjective: No significant events overnight, patient still has mild shortness of breath and cough.  Having significant pain in the right arm 10/10, denied any other significant complaints.  Physical Exam: General: NAD, lying comfortably Appear in no distress, affect appropriate Eyes: PERRLA ENT: Oral Mucosa Clear, moist  Neck: no JVD,  Cardiovascular: S1 and S2 Present, no Murmur,  Respiratory: Equal air entry bilaterally, mild bibasilar crackles, no wheezes.   Abdomen: Bowel Sound present, Soft and no tenderness,  Skin: no rashes Extremities: no Pedal edema, no calf tenderness Neurologic: CN grossly intact, right arm pain 10/10, power 2/5, RLE power 4/5, left side intact. Gait not checked due to patient safety concerns  Vitals:   11/26/23 2322 11/27/23 0000 11/27/23 0043 11/27/23 0835  BP:  117/85 123/84 136/80  Pulse:  (!) 107 (!) 103 93  Resp:  (!) 24 18 16   Temp: 98.2 F (36.8 C)  98 F (36.7 C) 98.6 F (37 C)  TempSrc: Oral   Oral  SpO2:  96% 97% 98%  Weight:      Height:        Intake/Output Summary (Last 24 hours) at 11/27/2023 1528 Last data filed at 11/27/2023 0900 Gross per 24 hour  Intake 970.29 ml  Output 1050 ml  Net -79.71 ml   Filed Weights   11/26/23 1716  Weight: 68 kg    Data Reviewed: I have personally reviewed and interpreted daily labs, tele strips, imagings as discussed above.  I reviewed all nursing notes, pharmacy notes, vitals, pertinent old records I have discussed plan of care as described above with RN and patient/family.  CBC: Recent Labs  Lab 11/26/23 1709 11/27/23 0620  WBC 21.6* 13.0*  NEUTROABS 12.5*  --   HGB 14.5 12.6*  HCT 44.6 37.7*  MCV 86.4 84.0  PLT 307 236   Basic Metabolic Panel: Recent Labs  Lab 11/26/23 1709 11/27/23 0620 11/27/23 0916  NA 139 138  --   K 3.1* 5.0  --   CL 109 112*  --   CO2  17* 19*  --   GLUCOSE 138* 125*  --   BUN 12 13  --   CREATININE 1.33* 1.03  --   CALCIUM 9.0 8.4*  --   MG  --   --  1.6*  PHOS  --   --  2.7    Studies: EEG adult Result Date: 11/27/2023 Charlsie Quest, MD     11/27/2023  1:04 PM Patient Name: MAYFORD ALBERG MRN: [id number] Epilepsy Attending: Charlsie Quest Referring Physician/Provider: Hannah Beat, MD Date: 11/27/2023 Duration: 31.22 mins Patient history: 63yo M who presented to the emergency room with acute onset of tonic-clonic seizure witnessed by his family with postictal confusion and lack of memory about the event. Level of alertness: Awake AEDs during EEG study: None Technical aspects: This EEG study was done with scalp electrodes positioned according to the 10-20 International system of electrode placement. Electrical activity was reviewed with band pass filter of 1-70Hz , sensitivity of 7 uV/mm, display speed of 97mm/sec with a 60Hz  notched filter applied as appropriate. EEG data were recorded continuously and digitally stored.  Video monitoring was available and reviewed as appropriate. Description: The posterior dominant rhythm consists of 8 Hz activity of moderate voltage (25-35 uV) seen predominantly in posterior head regions, symmetric and reactive to eye opening and eye closing. Hyperventilation and photic stimulation were not performed.   IMPRESSION: This study is within normal limits. No seizures or epileptiform discharges were seen throughout the recording. A normal interictal EEG does not exclude the diagnosis of epilepsy. Charlsie Quest   CT Angio Chest PE W/Cm &/Or Wo Cm Result Date: 11/26/2023 CLINICAL DATA:  Pulmonary embolus suspected with high probability. Patient was found on the floor with vomit and unresponsive. EXAM: CT ANGIOGRAPHY CHEST WITH CONTRAST TECHNIQUE: Multidetector CT imaging of the chest was performed using the standard protocol during bolus administration of intravenous contrast. Multiplanar CT image  reconstructions and MIPs were obtained to evaluate the vascular anatomy. RADIATION DOSE REDUCTION: This exam was performed according to the departmental dose-optimization program which includes automated exposure control, adjustment of the mA and/or kV according to patient size and/or use of iterative reconstruction technique. CONTRAST:  75mL OMNIPAQUE IOHEXOL 350 MG/ML SOLN COMPARISON:  Chest CT 07/18/2023.  Chest radiograph 11/26/2023 FINDINGS: Cardiovascular: Technically adequate study with good opacification of the central and segmental pulmonary arteries. Mild motion artifact. No focal filling defects. No evidence of significant pulmonary embolus. Mild cardiac enlargement with left ventricular hypertrophy. Ascending thoracic aortic aneurysm measuring 4.2 cm diameter. Minimal scattered aortic and coronary artery calcifications. Mediastinum/Nodes: Esophagus is decompressed. Thyroid gland is unremarkable. Lymphadenopathy demonstrated in the hila and mediastinum. Largest right hilar nodes measure about 1.3 cm in diameter. Largest pretracheal lymph nodes measure about 1.3 cm diameter. Lungs/Pleura: Loculated appearing pleural collection in the right posterior lower chest. Severe emphysematous changes in the lungs. Mild peripheral fibrosis. Focal infiltrates in the right lung base posteriorly  may represent pneumonia. Scattered pulmonary nodules, most prominent in the right apex, series 5, image 22, measuring 7 mm diameter. Small left pleural effusion with basilar atelectasis or infiltration. Upper Abdomen: No acute abnormalities. Diffuse atrophy of the left kidney. Musculoskeletal: Degenerative changes in the spine. No acute bony abnormalities. [place]-Sachs deformity of the right humerus. Review of the MIP images confirms the above findings. IMPRESSION: 1. No evidence of significant pulmonary embolus. 2. Cardiac enlargement with left ventricular hypertrophy. 3. Probable loculated collection in the right pleural  space, possibly related to infection. Infiltrates in the left 4. Small left pleural effusion with basilar atelectasis. 5. Diffuse emphysematous changes throughout the lungs with subpleural fibrosis. 6. Multiple pulmonary nodules. Most significant: Right solid pulmonary nodule within the upper lobe measuring 7 mm. Per Fleischner Society Guidelines, recommend a non-contrast Chest CT at 3-6 months, then another non-contrast Chest CT at 18-24 months. These guidelines do not apply to immunocompromised patients and patients with cancer. Follow up in patients with significant comorbidities as clinically warranted. For lung cancer screening, adhere to Lung-RADS guidelines. Reference: Radiology. 2017; 284(1):228-43. 7. Hilar and mediastinal lymphadenopathy is nonspecific and could represent reactive change versus lymphoproliferative process. Similar appearance to previous study. 8. 4.2 cm diameter ascending thoracic aortic aneurysm. Recommend annual imaging followup by CTA or MRA. This recommendation follows 2010 ACCF/AHA/AATS/ACR/ASA/SCA/SCAI/SIR/STS/SVM Guidelines for the Diagnosis and Management of Patients with Thoracic Aortic Disease. Circulation. 2010; 121: Z610-R604. Aortic aneurysm NOS (ICD10-I71.9) 9. Aortic atherosclerosis. Electronically Signed   By: Burman Nieves M.D.   On: 11/26/2023 19:58   DG Shoulder Right Result Date: 11/26/2023 CLINICAL DATA:  Larey Seat, pain EXAM: RIGHT SHOULDER - 2+ VIEW COMPARISON:  11/26/2023, 07/18/2023 FINDINGS: Internal rotation, external rotation, transscapular views of the right shoulder are obtained. No acute fracture, subluxation, or dislocation. Mild acromioclavicular and glenohumeral joint osteoarthritis. Soft tissue swelling overlying the right humeral head. Stable emphysema with small right pleural effusion versus pleural thickening. IMPRESSION: 1. Osteoarthritis.  No acute displaced fracture. 2. Emphysema, with stable right pleural thickening versus small pleural effusion.  Electronically Signed   By: Sharlet Salina M.D.   On: 11/26/2023 18:48   CT Head Wo Contrast Result Date: 11/26/2023 CLINICAL DATA:  Mental status change of unknown cause. Unresponsive. Found on the floor with vomiting. EXAM: CT HEAD WITHOUT CONTRAST TECHNIQUE: Contiguous axial images were obtained from the base of the skull through the vertex without intravenous contrast. RADIATION DOSE REDUCTION: This exam was performed according to the departmental dose-optimization program which includes automated exposure control, adjustment of the mA and/or kV according to patient size and/or use of iterative reconstruction technique. COMPARISON:  None Available. FINDINGS: Brain: No evidence of acute infarction, hemorrhage, hydrocephalus, extra-axial collection or mass lesion/mass effect. Mild cerebral atrophy. Vascular: No hyperdense vessel or unexpected calcification. Skull: Normal. Negative for fracture or focal lesion. Sinuses/Orbits: Paranasal sinuses are clear. Bilateral mastoid effusions. Old nasal bone fracture deformities. Other: None. IMPRESSION: 1. No acute intracranial abnormalities. 2. Bilateral mastoid effusions. Electronically Signed   By: Burman Nieves M.D.   On: 11/26/2023 18:31   DG Chest Port 1 View Result Date: 11/26/2023 CLINICAL DATA:  Syncope.  Altered mental status. EXAM: PORTABLE CHEST 1 VIEW COMPARISON:  03/26/2023. FINDINGS: Low lung volume. Redemonstration of homogeneous opacity along bilateral lower lateral lung zones, which may represent pleural thickening versus loculated pleural effusions. Bilateral lung fields are otherwise clear. No dense consolidation or lung collapse. No frank pulmonary edema. Bilateral costophrenic angles are clear. Stable cardio-mediastinal silhouette. No acute osseous  abnormalities. The soft tissues are within normal limits. IMPRESSION: No active disease. Electronically Signed   By: Jules Schick M.D.   On: 11/26/2023 18:01    Scheduled Meds:  cyanocobalamin   1,000 mcg Oral Daily   DULoxetine  30 mg Oral QHS   enoxaparin (LOVENOX) injection  40 mg Subcutaneous Q24H   folic acid  1 mg Oral Daily   furosemide  20 mg Oral Q M,W,F   guaiFENesin  600 mg Oral BID   losartan  100 mg Oral Daily   And   hydrochlorothiazide  25 mg Oral Daily   hydroxychloroquine  200 mg Oral 2 times per day on Monday Tuesday Wednesday Thursday Friday   [START ON 11/30/2023] hydroxychloroquine  200 mg Oral Once per day on Sunday Saturday   lubiprostone  24 mcg Oral BID   metoCLOPramide  10 mg Oral TID AC   mirtazapine  15 mg Oral QHS   pantoprazole  40 mg Oral BID   potassium chloride  10 mEq Oral Q M,W,F   sodium bicarbonate  650 mg Oral TID   tamsulosin  0.4 mg Oral Daily   Continuous Infusions:  azithromycin     cefTRIAXone (ROCEPHIN)  IV     lactated ringers 150 mL/hr (11/27/23 0555)   PRN Meds: acetaminophen **OR** acetaminophen, albuterol, chlorpheniramine-HYDROcodone, clonazePAM, LORazepam, magnesium hydroxide, morphine injection, ondansetron **OR** ondansetron (ZOFRAN) IV, oxyCODONE, traZODone  Time spent: 55 minutes  Author: Gillis Santa. MD [facility] 11/27/2023 3:28 PM  To reach On-call, see care teams to locate the attending and reach out to them via [email or web address]. If 7PM-7AM, please contact night-coverage If you still have difficulty reaching the attending provider, please page the [facility] (Director on Call) for [facility] on amion for assistance.

## 2023-11-27 NOTE — Assessment & Plan Note (Signed)
-   We will replace potassium and check magnesium level.

## 2023-11-27 NOTE — Assessment & Plan Note (Signed)
-   We will continue Klonopin, Remeron and Cymbalta.

## 2023-11-27 NOTE — Progress Notes (Signed)
 Eeg done

## 2023-11-27 NOTE — Assessment & Plan Note (Signed)
-   The patient be placed on scheduled and as needed DuoNebs. - Mucolytic therapy will be provided.

## 2023-11-27 NOTE — Assessment & Plan Note (Signed)
-   We will continue methotrexate. 

## 2023-11-27 NOTE — Progress Notes (Signed)
*  PRELIMINARY RESULTS* Echocardiogram 2D Echocardiogram has been performed.  Carolyne Fiscal 11/27/2023, 2:47 PM

## 2023-11-27 NOTE — Assessment & Plan Note (Signed)
-   The will be placed on seizure precautions. - We will obtain an EEG. - Neurology consult to be obtained in a.m. - I notified Dr. Selina Cooley about the patient. - We will place him on as needed IV Ativan for now.

## 2023-11-27 NOTE — Assessment & Plan Note (Signed)
-   We will continue Amitiza.

## 2023-11-27 NOTE — Plan of Care (Signed)
  Problem: Fluid Volume: Goal: Hemodynamic stability will improve Outcome: Progressing   Problem: Clinical Measurements: Goal: Diagnostic test results will improve Outcome: Progressing   Problem: Respiratory: Goal: Ability to maintain adequate ventilation will improve Outcome: Progressing   Problem: Nutrition: Goal: Adequate nutrition will be maintained Outcome: Progressing   Problem: Coping: Goal: Level of anxiety will decrease Outcome: Progressing

## 2023-11-27 NOTE — Assessment & Plan Note (Signed)
 -  We will continue PPI therapy

## 2023-11-27 NOTE — Assessment & Plan Note (Signed)
-   This is secondary to #1. - Management as above. - O2 protocol will be followed.

## 2023-11-27 NOTE — Procedures (Signed)
 Patient Name: Jose Cuevas  MRN: [id number]  Epilepsy Attending: Charlsie Quest  Referring Physician/Provider: Hannah Beat, MD  Date: 11/27/2023 Duration: 31.22 mins  Patient history: 63yo M who presented to the emergency room with acute onset of tonic-clonic seizure witnessed by his family with postictal confusion and lack of memory about the event.   Level of alertness: Awake  AEDs during EEG study: None  Technical aspects: This EEG study was done with scalp electrodes positioned according to the 10-20 International system of electrode placement. Electrical activity was reviewed with band pass filter of 1-70Hz , sensitivity of 7 uV/mm, display speed of 86mm/sec with a 60Hz  notched filter applied as appropriate. EEG data were recorded continuously and digitally stored.  Video monitoring was available and reviewed as appropriate.  Description: The posterior dominant rhythm consists of 8 Hz activity of moderate voltage (25-35 uV) seen predominantly in posterior head regions, symmetric and reactive to eye opening and eye closing. Hyperventilation and photic stimulation were not performed.     IMPRESSION: This study is within normal limits. No seizures or epileptiform discharges were seen throughout the recording.  A normal interictal EEG does not exclude the diagnosis of epilepsy.  Yvonnia Tango Annabelle Harman

## 2023-11-27 NOTE — Assessment & Plan Note (Signed)
-   We will continue therapy.

## 2023-11-27 NOTE — Assessment & Plan Note (Signed)
-   The patient will be hydrated with IV lactated ringer. - Will follow BMP. - We will avoid nephrotoxins.

## 2023-11-27 NOTE — Assessment & Plan Note (Signed)
 Will hold off ARB therapy given his AKI. - BP has been fairly controlled.

## 2023-11-28 ENCOUNTER — Inpatient Hospital Stay: Payer: Medicare Other

## 2023-11-28 DIAGNOSIS — A419 Sepsis, unspecified organism: Secondary | ICD-10-CM | POA: Diagnosis not present

## 2023-11-28 DIAGNOSIS — J189 Pneumonia, unspecified organism: Secondary | ICD-10-CM | POA: Diagnosis not present

## 2023-11-28 DIAGNOSIS — I63512 Cerebral infarction due to unspecified occlusion or stenosis of left middle cerebral artery: Secondary | ICD-10-CM

## 2023-11-28 LAB — BASIC METABOLIC PANEL
Anion gap: 9 (ref 5–15)
BUN: 12 mg/dL (ref 8–23)
CO2: 19 mmol/L — ABNORMAL LOW (ref 22–32)
Calcium: 8.2 mg/dL — ABNORMAL LOW (ref 8.9–10.3)
Chloride: 105 mmol/L (ref 98–111)
Creatinine, Ser: 1.15 mg/dL (ref 0.61–1.24)
GFR, Estimated: >60 mL/min (ref >60)
Glucose, Bld: 122 mg/dL — ABNORMAL HIGH (ref 70–99)
Potassium: 4.3 mmol/L (ref 3.5–5.1)
Sodium: 133 mmol/L — ABNORMAL LOW (ref 135–145)

## 2023-11-28 LAB — CBC
HCT: 34 % — ABNORMAL LOW (ref 39.0–52.0)
Hemoglobin: 11.2 g/dL — ABNORMAL LOW (ref 13.0–17.0)
MCH: 28.1 pg (ref 26.0–34.0)
MCHC: 32.9 g/dL (ref 30.0–36.0)
MCV: 85.2 fL (ref 80.0–100.0)
Platelets: 199 10*3/uL (ref 150–400)
RBC: 3.99 MIL/uL — ABNORMAL LOW (ref 4.22–5.81)
RDW: 15.7 % — ABNORMAL HIGH (ref 11.5–15.5)
WBC: 10.4 10*3/uL (ref 4.0–10.5)
nRBC: 0 % (ref 0.0–0.2)

## 2023-11-28 LAB — ECHOCARDIOGRAM COMPLETE
AR max vel: 2.14 cm2
AV Area VTI: 2.26 cm2
AV Area mean vel: 2.14 cm2
AV Mean grad: 8 mm[Hg]
AV Peak grad: 19.9 mm[Hg]
Ao pk vel: 2.23 m/s
Area-P 1/2: 6.54 cm2
Calc EF: 38.8 %
Height: 69 in
MV VTI: 2.7 cm2
P 1/2 time: 374 ms
S' Lateral: 2.7 cm
Single Plane A2C EF: 43 %
Single Plane A4C EF: 32.4 %
Weight: 2400 [oz_av]

## 2023-11-28 LAB — LIPID PANEL
Cholesterol: 115 mg/dL (ref 0–200)
HDL: 51 mg/dL (ref >40)
LDL Cholesterol: 47 mg/dL (ref 0–99)
Total CHOL/HDL Ratio: 2.3 {ratio}
Triglycerides: 86 mg/dL (ref <150)
VLDL: 17 mg/dL (ref 0–40)

## 2023-11-28 LAB — MAGNESIUM: Magnesium: 2.1 mg/dL (ref 1.7–2.4)

## 2023-11-28 LAB — TSH: TSH: 1.817 u[IU]/mL (ref 0.350–4.500)

## 2023-11-28 LAB — PHOSPHORUS: Phosphorus: 3.2 mg/dL (ref 2.5–4.6)

## 2023-11-28 LAB — CK: Total CK: 625 U/L — ABNORMAL HIGH (ref 49–397)

## 2023-11-28 MED ORDER — GADOBUTROL 1 MMOL/ML IV SOLN
7.0000 mL | Freq: Once | INTRAVENOUS | Status: AC | PRN
  Administered 2023-11-28: 7 mL via INTRAVENOUS

## 2023-11-28 MED ORDER — LEVETIRACETAM 500 MG PO TABS
500.0000 mg | ORAL_TABLET | Freq: Two times a day (BID) | ORAL | Status: DC
  Administered 2023-11-29 – 2023-12-05 (×12): 500 mg via ORAL
  Filled 2023-11-28 (×12): qty 1

## 2023-11-28 MED ORDER — STROKE: EARLY STAGES OF RECOVERY BOOK: Freq: Once | Status: AC

## 2023-11-28 MED ORDER — IOHEXOL 350 MG/ML SOLN
75.0000 mL | Freq: Once | INTRAVENOUS | Status: AC | PRN
  Administered 2023-11-28: 75 mL via INTRAVENOUS

## 2023-11-28 MED ORDER — CLOPIDOGREL BISULFATE 75 MG PO TABS
75.0000 mg | ORAL_TABLET | Freq: Every day | ORAL | Status: DC
  Administered 2023-11-28: 75 mg via ORAL
  Filled 2023-11-28: qty 1

## 2023-11-28 MED ORDER — ASPIRIN 81 MG PO TBEC
81.0000 mg | DELAYED_RELEASE_TABLET | Freq: Every day | ORAL | Status: DC
  Administered 2023-11-28: 81 mg via ORAL
  Filled 2023-11-28: qty 1

## 2023-11-28 MED ORDER — SODIUM CHLORIDE 0.9 % IV SOLN
500.0000 mg | INTRAVENOUS | Status: AC
  Administered 2023-11-28: 500 mg via INTRAVENOUS
  Filled 2023-11-28 (×2): qty 5

## 2023-11-28 MED ORDER — LEVETIRACETAM IN NACL 1500 MG/100ML IV SOLN
1500.0000 mg | Freq: Once | INTRAVENOUS | Status: AC
  Administered 2023-11-28: 1500 mg via INTRAVENOUS
  Filled 2023-11-28: qty 100

## 2023-11-28 MED ORDER — SODIUM CHLORIDE 0.9 % IV SOLN: INTRAVENOUS | Status: AC

## 2023-11-28 MED ORDER — SODIUM BICARBONATE 650 MG PO TABS
650.0000 mg | ORAL_TABLET | Freq: Three times a day (TID) | ORAL | Status: AC
  Administered 2023-11-28 – 2023-11-29 (×6): 650 mg via ORAL
  Filled 2023-11-28 (×7): qty 1

## 2023-11-28 NOTE — Evaluation (Signed)
 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 Associated hemorrhage or mass effect., No other acute intracranial abnormality. Solitary chronic  microhemorrhage and mild for age underlying white matter signal  changes, most commonly due to chronic small vessel disease, Bilateral mastoid effusions, likely postinflammatory. No  complicating features are identified.      PMH significant for HL, HTN, gastroparesis, RA, OSA, Centrilobular emphysema (HCC), IBS     Clinical Impressions Chart reviewed, pt in room with family present, oriented to self. Increased time required for all one step directions, pt is a poor historian but per family pt is MOD I-I in ADL/IADL, drives, amb with no AD. Pt recently fell in the shower, wife helped him up. Pt presents with deficits in strength, endurance, activity tolerance, balance, cognition, RUE function affecting safe and optimal ADL completion. MAX A required for bed mobility, fair sitting balance on edge of bed for approx 5 minutes, MAX A via HHA (L hand) for STS two attempts with pt unable to come into full stand/maintain standing. MAX A for grooming/dressing tasks required. Pt with extreme pain with any attempted RUE movement, nurse/doctor notified. RUE positionied for support in bed. Pt will benefit from acute OT to address deficits and to facilitate optimal ADL performance. Pt is left in bed, all needs met. OT will follow acutely.      If plan is discharge home, recommend the following:   A lot of help with bathing/dressing/bathroom;A lot of help with walking and/or transfers;Direct supervision/assist for medications management;Direct supervision/assist for financial  management;Assist for transportation;Help with stairs or ramp for entrance;Assistance with feeding;Assistance with cooking/housework     Functional Status Assessment   Patient has had a recent decline in their functional status and demonstrates the ability to make significant improvements in function in a reasonable and predictable amount of time.     Equipment Recommendations   Other (comment) (defer to next venue of care)     Recommendations for Other Services         Precautions/Restrictions   Precautions Precautions: Fall Recall of Precautions/Restrictions: Impaired Restrictions Weight Bearing Restrictions Per Provider Order: No     Mobility Bed Mobility Overal bed mobility: Needs Assistance Bed Mobility: Supine to Sit, Sit to Supine     Supine to sit: Max assist, HOB elevated Sit to supine: Max assist, HOB elevated        Transfers Overall transfer level: Needs assistance Equipment used: 1 person hand held assist Transfers: Sit to/from Stand Sit to Stand: Max assist (2 attempts, unable to maintain full standing)                  Balance Overall balance assessment: Needs assistance Sitting-balance support: Feet supported Sitting balance-Leahy Scale: Poor     Standing balance support: Single extremity supported Standing balance-Leahy Scale: Zero                             ADL either performed or assessed with clinical judgement   ADL Overall ADL's : Needs assistance/impaired     Grooming: Maximal assistance           Upper Body Dressing : Maximal assistance   Lower Body  Dressing: Maximal assistance Lower Body Dressing Details (indicate cue type and reason): socks     Toileting- Clothing Manipulation and Hygiene: Maximal assistance Toileting - Clothing Manipulation Details (indicate cue type and reason): anticipate             Vision   Additional Comments: will continue to assess, limited on this date but no  functional vision deficits noted     Perception Perception: Impaired   Perception-Other Comments: will continue to assess   Praxis Praxis: Impaired Praxis Impairment Details: Initiation, Motor planning Praxis-Other Comments: will continue to assess   Pertinent Vitals/Pain Pain Assessment Pain Assessment: Faces Faces Pain Scale: Hurts worst Pain Location: RUE with attempted mobility Pain Descriptors / Indicators: Moaning, Grimacing Pain Intervention(s): Monitored during session, Limited activity within patient's tolerance, Repositioned (notified nurse and doctor regarding pain)     Extremity/Trunk Assessment Upper Extremity Assessment Upper Extremity Assessment: Right hand dominant;RUE deficits/detail;Generalized weakness RUE Deficits / Details: unable to tolerate any RUE PROM. Pt able to wiggle fingers, flex/extend wrist; unable to perform any additional AROM due to pain; RUE positioned to support shoulder/arm with pillows at end of session RUE: Unable to fully assess due to pain   Lower Extremity Assessment Lower Extremity Assessment: Defer to PT evaluation       Communication Communication Communication: Impaired Factors Affecting Communication: Difficulty expressing self;Reduced clarity of speech   Cognition Arousal: Alert Behavior During Therapy: Flat affect Cognition: Cognition impaired   Orientation impairments: Place, Time, Situation Awareness: Intellectual awareness impaired, Online awareness impaired Memory impairment (select all impairments): Short-term memory Attention impairment (select first level of impairment): Sustained attention Executive functioning impairment (select all impairments): Initiation, Organization, Sequencing, Reasoning, Problem solving                   Following commands: Impaired Following commands impaired: Follows one step commands with increased time (step by step -frequent multi modal cueing)     Cueing  General  Comments   Cueing Techniques: Verbal cues;Tactile cues;Visual cues;Gestural cues      Exercises Other Exercises Other Exercises: edu pt/family re: role of OT, role of rehab   Shoulder Instructions      Home Living Family/patient expects to be discharged to:: Private residence Living Arrangements: Spouse/significant other Available Help at Discharge: Family;Available 24 hours/day Type of Home: Mobile home Home Access: Stairs to enter Entrance Stairs-Number of Steps: 8 Entrance Stairs-Rails: Can reach both Home Layout: One level     Bathroom Shower/Tub: Chief Strategy Officer: Standard     Home Equipment: Cane - single Librarian, academic (2 wheels)          Prior Functioning/Environment Prior Level of Function : Independent/Modified Independent;History of Falls (last six months);Driving             Mobility Comments: amb with no AD ADLs Comments: MOD I-I in ADL/IADL, retired    OT Problem List: Decreased strength;Decreased activity tolerance;Decreased knowledge of use of DME or AE;Decreased safety awareness;Decreased cognition;Impaired balance (sitting and/or standing);Impaired UE functional use   OT Treatment/Interventions: Self-care/ADL training;DME and/or AE instruction;Therapeutic activities;Balance training;Therapeutic exercise;Cognitive remediation/compensation;Neuromuscular education;Energy conservation;Patient/family education;Modalities;Splinting      OT Goals(Current goals can be found in the care plan section)   Acute Rehab OT Goals Patient Stated Goal: return to PLOF OT Goal Formulation: With patient/family Time For Goal Achievement: 12/12/23 Potential to Achieve Goals: Good ADL Goals Pt Will Perform Grooming: with modified independence;sitting Pt Will Perform Lower Body Dressing: with modified independence;sitting/lateral leans;sit to/from stand  Pt Will Transfer to Toilet: with modified independence;ambulating Pt Will Perform  Toileting - Clothing Manipulation and hygiene: with modified independence;sit to/from stand;sitting/lateral leans   OT Frequency:  Min 1X/week    Co-evaluation              AM-PAC OT "6 Clicks" Daily Activity     Outcome Measure Help from another person eating meals?: A Lot Help from another person taking care of personal grooming?: A Lot Help from another person toileting, which includes using toliet, bedpan, or urinal?: A Lot Help from another person bathing (including washing, rinsing, drying)?: A Lot Help from another person to put on and taking off regular upper body clothing?: A Lot Help from another person to put on and taking off regular lower body clothing?: A Lot 6 Click Score: 12   End of Session Equipment Utilized During Treatment: Gait belt Nurse Communication: Mobility status;Other (comment) (nurse/doctor re: RUE function/pain)  Activity Tolerance: Patient limited by pain Patient left: in bed;with call bell/phone within reach;with bed alarm set;with family/visitor present  OT Visit Diagnosis: Other abnormalities of gait and mobility (R26.89);Muscle weakness (generalized) (M62.81)                Time: 0981-1914 OT Time Calculation (min): 27 min Charges:  OT General Charges $OT Visit: 1 Visit OT Evaluation $OT Eval Moderate Complexity: 1 Mod  Oleta Mouse, OTD OTR/L  11/28/23, 1:44 PM

## 2023-11-28 NOTE — Progress Notes (Signed)
  Inpatient Rehab Admissions Coordinator :  Per OT therapy recommendations patient was screened for CIR candidacy by Ottie Glazier RN MSN.  PT recommends SNF. Patient is not at a level to tolerate the intensity required to pursue a CIR admit . The CIR admissions team will follow and monitor for progress and place a Rehab Consult order if felt to be appropriate. Otherwise other rehab venues should be pursued. Please contact me with any questions.  Ottie Glazier RN MSN Admissions Coordinator [phone]

## 2023-11-28 NOTE — Progress Notes (Signed)
 [facility] Progress Note  Patient: Jose Cuevas    ZHY:[id number]  DOA: 11/26/2023     Date of Service: the patient was seen and examined on 11/28/2023  Chief Complaint  Patient presents with   unresponsive   Brief hospital course: Jose Cuevas is a 63 y.o. Caucasian male with medical history significant for hypertension, dyslipidemia, gastroparesis, rheumatoid arthritis, OSA and DDD, who presented to the emergency room with acute onset of tonic-clonic seizure witnessed by his family with postictal confusion and lack of memory about the event.  The patient later had nausea and vomiting.  Did not any fever or chills.  No chest pain or palpitations.  He had right shoulder pain when he fell during his seizure.  The patient denies any significant cough or wheezing or dyspnea.  No paresthesias or focal muscle weakness.  He had no urinary or stool incontinence.  No tongue bites.  No dysuria, oliguria or hematuria or flank pain.   ED Course: When the patient came to the ER, BP was 151/84 with pulse oximetry of 98% on 4 L of O2 by nasal cannula and later respiratory rate was 30 and otherwise normal vital signs.  Labs revealed hypokalemia 3.1 and creatinine 1.33 above previous levels.  High-sensitivity troponin I was 54 and later 125.  Lactic acid was 4.4 and later 2.5.  CBC showed leukocytosis 21.6 with neutrophilia. EKG as reviewed by me : EKG showed sinus rhythm with rate of 92 and left bundle branch block. Imaging: Right shoulder x-ray showed osteoarthritis with no acute displaced fracture.  It showed emphysema with stable right pleural thickening versus small pleural effusion.  Portable chest x-ray showed no acute cardiopulmonary disease. Chest CTA revealed the following: 1. No evidence of significant pulmonary embolus. 2. Cardiac enlargement with left ventricular hypertrophy. 3. Probable loculated collection in the right pleural space, possibly related to infection. Infiltrates in the left 4. Small  left pleural effusion with basilar atelectasis. 5. Diffuse emphysematous changes throughout the lungs with subpleural fibrosis. 6. Multiple pulmonary nodules. Most significant: Right solid pulmonary nodule within the upper lobe measuring 7 mm. Per Fleischner Society Guidelines, recommend a non-contrast Chest CT at 3-6 months, then another non-contrast Chest CT at 18-24 months. 7. Hilar and mediastinal lymphadenopathy is nonspecific and could represent reactive change versus lymphoproliferative process. Similar appearance to previous study. 8. 4.2 cm diameter ascending thoracic aortic aneurysm. Recommend annual imaging followup by CTA or MRA. 9. Aortic atherosclerosis.   Noncontrasted head CT scan revealed no acute intracranial normalities.  It showed bilateral mastoid effusions.  The patient was given IV Rocephin and Zithromax, p.o. ibuprofen, 4 mg of IV Zofran, 40 mill Cabbell of p.o. potassium chloride and 1 L bolus of IV normal saline.  He will be admitted to a medical telemetry bed for further evaluation and management.   Assessment and Plan:  # Acute left MCA infarct MRI: Positive for several punctate acute infarcts scattered in the Left MCA territory. Associated hemorrhage or mass effect. Started aspirin and Plavix,  LDL 47, below goal 70, TSH 1.8 within normal range Held statin for now due to elevated CK level Continue telemetry Continue NIH stroke scale as per protocol Follow PT OT and SLP TTE LVEF 35 to 40%, no WMA, moderate MR and AR Follow neurology for further recommendation   # Sepsis due to pneumonia  # Acute respiratory failure with hypoxia due to PNA - Sepsis manifested by leukocytosis and tachypnea. - Continue IV Rocephin and Zithromax. - Mucolytic  therapy be provided as well as duo nebs q.i.d. and q.4 hours p.r.n. - We will follow blood cultures. -The patient be hydrated with IV ringer. - This is likely the reason for his acute respiratory failure with hypoxia.    New onset seizure most likely due to acute CVA - Continue seizure precautions.  EEG negative - Neurology consulted, recommended MRI brain and C-spine.  MRI positive for acute CVA -Continue prn IV Ativan for now.   Elevated troponin, most likely demand ischemia Trop peaked 174 Patient denies any chest pain TTE LVEF 35 to 40%, no WMA, moderate MR and AR  Rhabdomyolysis most likely due to seizures CK 1700--625 Continue IV fluid for hydration Trend CK level  Hypokalemia, potassium repleted Hypomagnesemia, mag repleted. Monitor electrolytes and replete as needed.    AKI (acute kidney injury) resolved Renal functions improved after IV fluid Monitor renal functions daily Discontinued Lasix and hydrochlorothiazide for now Metabolic acidosis, started oral bicarbonate. Monitor BMP daily    Centrilobular emphysema (HCC) - The patient be placed on scheduled and as needed DuoNebs. - Mucolytic therapy will be provided.   GERD without esophagitis - We will continue PPI therapy.   Anxiety and depression - We will continue Klonopin, Remeron and Cymbalta.   Dyslipidemia - Hold the statin for now due to elevated CK level   IBS (irritable bowel syndrome) - We will continue Amitiza.   Essential hypertension Resumed losartan 100 mg p.o. daily with holding parameters Discontinued Lasix and hydrochlorothiazide Monitor BP and titrate medications accordingly  Rheumatoid arthritis with rheumatoid factor (HCC) - We will continue methotrexate.   Pulmonary nodules, incidental finding CT scan: Right solid pulmonary nodule within the upper lobe measuring 7 mm. Per Fleischner Society Guidelines, recommend a non-contrast Chest CT at 3-6 months, then another non-contrast Chest CT at 18-24 months. Recommended to follow with PCP and pulmonary as an outpatient  Ascending aortic aneurysm, incidental finding CT scan: 4.2 cm diameter ascending thoracic aortic aneurysm. Recommend annual imaging  followup by CTA or MRA. 9. Aortic atherosclerosis. Follow with PCP as an outpatient  # Right arm pain Follow-up right humerus CT scan to rule out fracture    Body mass index is 22.15 kg/m.  Interventions:  Diet: Heart healthy diet DVT Prophylaxis: Subcutaneous Lovenox   Advance goals of care discussion: Full code  Family Communication: family was present at bedside, at the time of interview.  The pt provided permission to discuss medical plan with the family. Opportunity was given to ask question and all questions were answered satisfactorily.   Disposition:  Pt is from Home, admitted with Acute CVA, seizures, respiratory failure, pneumonia, which precludes a safe discharge. Discharge to SNF, TBD after PT/OT eval, when stable.  Subjective: No significant events overnight, patient still having significant pain in the right arm, unable to move.  Patient is little bit confused, AO x 2.  Denied any specific complaints.  Patient's family was at bedside, management plan discussed.   Physical Exam: General: NAD, lying comfortably Appear in no distress, affect appropriate Eyes: PERRLA ENT: Oral Mucosa Clear, moist  Neck: no JVD,  Cardiovascular: S1 and S2 Present, no Murmur,  Respiratory: Equal air entry bilaterally, mild bibasilar crackles, no wheezes.   Abdomen: Bowel Sound present, Soft and no tenderness,  Skin: no rashes Extremities: no Pedal edema, no calf tenderness Neurologic: CN grossly intact, c/o right arm pain, power 1-2/5, RLE power 3/5, left side intact. Gait not checked due to patient safety concerns  Vitals:  11/27/23 1615 11/28/23 0120 11/28/23 0851 11/28/23 1239  BP: 117/70 126/77 (!) 142/89 135/81  Pulse: 98 (!) 110 (!) 108 (!) 106  Resp: 17 18 18 16   Temp:  98.2 F (36.8 C) 98.3 F (36.8 C)   TempSrc:   Oral   SpO2: 97% 95% 96% 96%  Weight:      Height:        Intake/Output Summary (Last 24 hours) at 11/28/2023 1525 Last data filed at 11/28/2023  0539 Gross per 24 hour  Intake 351.45 ml  Output 500 ml  Net -148.55 ml   Filed Weights   11/26/23 1716  Weight: 68 kg    Data Reviewed: I have personally reviewed and interpreted daily labs, tele strips, imagings as discussed above. I reviewed all nursing notes, pharmacy notes, vitals, pertinent old records I have discussed plan of care as described above with RN and patient/family.  CBC: Recent Labs  Lab 11/26/23 1709 11/27/23 0620 11/28/23 0559  WBC 21.6* 13.0* 10.4  NEUTROABS 12.5*  --   --   HGB 14.5 12.6* 11.2*  HCT 44.6 37.7* 34.0*  MCV 86.4 84.0 85.2  PLT 307 236 199   Basic Metabolic Panel: Recent Labs  Lab 11/26/23 1709 11/27/23 0620 11/27/23 0916 11/28/23 0559  NA 139 138  --  133*  K 3.1* 5.0  --  4.3  CL 109 112*  --  105  CO2 17* 19*  --  19*  GLUCOSE 138* 125*  --  122*  BUN 12 13  --  12  CREATININE 1.33* 1.03  --  1.15  CALCIUM 9.0 8.4*  --  8.2*  MG  --   --  1.6* 2.1  PHOS  --   --  2.7 3.2    Studies: MR CERVICAL SPINE WO CONTRAST Result Date: 11/28/2023 CLINICAL DATA:  63 year old male with altered mental status, seizure. Found unresponsive. EXAM: MRI CERVICAL SPINE WITHOUT CONTRAST TECHNIQUE: Multiplanar, multisequence MR imaging of the cervical spine was performed. No intravenous contrast was administered. COMPARISON:  Brain MRI without and with contrast the same day reported separately. Cervical spine CT 10/15/2009. FINDINGS: Alignment: Normal cervical lordosis. Vertebrae: Maintained vertebral body height. Normal background bone marrow signal. No marrow edema or evidence of acute osseous abnormality. Cord: Normal.  Capacious spinal canal at most levels. Posterior Fossa, vertebral arteries, paraspinal tissues: Cervicomedullary junction is within normal limits. Brain is detailed separately today. Preserved major vascular flow voids in the visible neck. Negative visible neck soft tissues, grossly negative visible lung apices. Disc levels: C2-C3:   Minor disc bulging and facet hypertrophy.  No stenosis. C3-C4: Small right paracentral disc protrusion (series 12, image 8). Mild facet hypertrophy. Mild ligament flavum hypertrophy. No spinal stenosis. Mild to moderate left C4 foraminal stenosis due to combined disc osteophyte and facet disease. C4-C5: Mild mostly foraminal disc bulging and endplate spurring. Mild to moderate facet and ligament flavum hypertrophy. No spinal stenosis. Moderate to severe left, mild right C5 foraminal stenosis. C5-C6: Disc space loss. Circumferential disc osteophyte complex asymmetric to the left. Mild facet and ligament flavum hypertrophy. Borderline to mild spinal stenosis. Severe left and moderate to severe right C6 foraminal stenosis. C6-C7:  Mild disc bulging and facet hypertrophy.  No stenosis. C7-T1: Mild central disc bulge or protrusion. Mild facet hypertrophy. No spinal stenosis. Mild bilateral C8 neural foraminal stenosis. No visible upper thoracic spinal stenosis. IMPRESSION: 1. No acute osseous abnormality. Normal cervical spinal cord. 2. Cervical spine degeneration with isolated borderline to mild  spinal stenosis at C6-C7. Multilevel degenerative neural foraminal stenosis, up to severe at the left C5 and left greater than right C6 nerve levels. Otherwise mild to moderate. Electronically Signed   By: Odessa Fleming M.D.   On: 11/28/2023 04:12   MR BRAIN W WO CONTRAST Result Date: 11/28/2023 CLINICAL DATA:  63 year old male with altered mental status, seizure. Found unresponsive. EXAM: MRI HEAD WITHOUT AND WITH CONTRAST TECHNIQUE: Multiplanar, multiecho pulse sequences of the brain and surrounding structures were obtained without and with intravenous contrast. CONTRAST:  7mL GADAVIST GADOBUTROL 1 MMOL/ML IV SOLN COMPARISON:  Head CT 11/26/2023. Cervical spine MRI the same day reported separately. FINDINGS: Brain: Several punctate foci of abnormal diffusion in the left hemisphere both the anterior MCA territory (series 8, image  37) and at the posterior left MCA/PCA watershed (image 29). Faint if any associated T2 and FLAIR hyperintensity. No abnormal enhancement identified. No right hemisphere or posterior fossa diffusion abnormality. No midline shift, mass effect, evidence of mass lesion, ventriculomegaly, extra-axial collection or acute intracranial hemorrhage. Mild for age scattered nonspecific cerebral white matter T2 and FLAIR hyperintensity, mostly subcortical. Solitary chronic microhemorrhage in the right parietal lobe series 12, image 42 on SWI. No cortical encephalomalacia identified. Deep gray matter nuclei, brainstem and cerebellum appear negative. Thin slice T2 images demonstrate hippocampal formations, mesial temporal lobes are symmetric and within normal limits. No abnormal enhancement identified. No dural thickening. Vascular: Major intracranial vascular flow voids are preserved. Following contrast the major dural venous sinuses are enhancing and appear to be patent. Skull and upper cervical spine: Negative for age visible cervical spine and normal background bone marrow signal. Sinuses/Orbits: Negative orbits. Mild bilateral paranasal sinus mucosal thickening. Other: Bilateral mastoid effusions. No associated enhancement or complicating features. Negative visible pharynx. Negative visible scalp and face. IMPRESSION: 1. 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 Associated hemorrhage or mass effect. 2. No other acute intracranial abnormality. Solitary chronic microhemorrhage and mild for age underlying white matter signal changes, most commonly due to chronic small vessel disease. 3. Bilateral mastoid effusions, likely postinflammatory. No complicating features are identified. Electronically Signed   By: Odessa Fleming M.D.   On: 11/28/2023 04:03    Scheduled Meds:  [START ON 11/29/2023]  stroke: early stages of recovery book   Does not apply Once   aspirin EC  81 mg Oral Daily    clopidogrel  75 mg Oral Daily   cyanocobalamin  1,000 mcg Oral Daily   DULoxetine  30 mg Oral QHS   enoxaparin (LOVENOX) injection  40 mg Subcutaneous Q24H   folic acid  1 mg Oral Daily   guaiFENesin  600 mg Oral BID   hydroxychloroquine  200 mg Oral 2 times per day on Monday Tuesday Wednesday Thursday Friday   [START ON 11/30/2023] hydroxychloroquine  200 mg Oral Once per day on Sunday Saturday   lubiprostone  24 mcg Oral BID   metoCLOPramide  10 mg Oral TID AC   mirtazapine  15 mg Oral QHS   pantoprazole  40 mg Oral BID   potassium chloride  10 mEq Oral Q M,W,F   sodium bicarbonate  650 mg Oral TID   tamsulosin  0.4 mg Oral Daily   Continuous Infusions:  sodium chloride 75 mL/hr at 11/28/23 1011   azithromycin     cefTRIAXone (ROCEPHIN)  IV Stopped (11/27/23 2037)   PRN Meds: acetaminophen **OR** acetaminophen, albuterol, chlorpheniramine-HYDROcodone, clonazePAM, LORazepam, magnesium hydroxide, morphine injection, ondansetron **OR** ondansetron (ZOFRAN) IV,  oxyCODONE, traZODone  Time spent: 55 minutes  Author: Gillis Santa. MD [facility] 11/28/2023 3:25 PM  To reach On-call, see care teams to locate the attending and reach out to them via [email or web address]. If 7PM-7AM, please contact night-coverage If you still have difficulty reaching the attending provider, please page the [facility] (Director on Call) for [facility] on amion for assistance.

## 2023-11-28 NOTE — Evaluation (Signed)
 Physical Therapy Evaluation Patient Details Name: Jose Cuevas MRN: [id number] DOB: [birth year] Today's Date: 11/28/2023  History of Present Illness  63 yo man who presented to hospital after first lifetime GTC in the setting of CAP, MRI showing Positive for several punctate acute infarcts scattered in the  Left MCA territory. Associated hemorrhage or mass effect., No other acute intracranial abnormality. Solitary chronic  microhemorrhage and mild for age underlying white matter signal  changes, most commonly due to chronic small vessel disease, Bilateral mastoid effusions, likely postinflammatory. No  complicating features are identified.      PMH significant for HL, HTN, gastroparesis, RA, OSA, Centrilobular emphysema (HCC), IBS  Clinical Impression  Pt with severe R sided deficits.  Despite R shoulder pain he showed good effort with trying to participate as able but profound weakness and pain limited R side signficantly.  He was able to show some trace R quad and hip movement and a little R hand and elbow motion but lacked control, quality of movement for really functional strength.  Pt could not transition to sitting w/o total assist, he did manage to maintain sitting balance with only minA for a little, but ultimately is limited.  Pt will require further PT to address significant functional limitations.        If plan is discharge home, recommend the following: Two people to help with walking and/or transfers;Two people to help with bathing/dressing/bathroom;Assistance with cooking/housework;Assistance with feeding;Direct supervision/assist for medications management;Direct supervision/assist for financial management;Assist for transportation;Help with stairs or ramp for entrance   Can travel by private vehicle   No    Equipment Recommendations  (TBD at next venue of care)  Recommendations for Other Services       Functional Status Assessment Patient has had a recent decline in their  functional status and demonstrates the ability to make significant improvements in function in a reasonable and predictable amount of time.     Precautions / Restrictions Precautions Precautions: Fall Restrictions Weight Bearing Restrictions Per Provider Order: No      Mobility  Bed Mobility Overal bed mobility: Needs Assistance Bed Mobility: Supine to Sit, Sit to Supine     Supine to sit: Max assist, HOB elevated Sit to supine: Max assist, HOB elevated   General bed mobility comments: Pt able to give some minimal effort to get up to sitting EOB but ultimatley was max/total assist    Transfers                   General transfer comment: deferred, pt doing down to MRI soon and was unable to attain standing with attempts earlier today during OT eval    Ambulation/Gait                  Stairs            Wheelchair Mobility     Tilt Bed    Modified Rankin (Stroke Patients Only)       Balance Overall balance assessment: Needs assistance Sitting-balance support: Feet supported Sitting balance-Leahy Scale: Fair                                       Pertinent Vitals/Pain Pain Assessment Pain Assessment: Faces Faces Pain Scale: Hurts whole lot Pain Location: R shoulder with any attempted movement    Home Living Family/patient expects to be discharged to:: Skilled nursing facility Living Arrangements:  Spouse/significant other Available Help at Discharge: Family;Available 24 hours/day Type of Home: Mobile home Home Access: Stairs to enter Entrance Stairs-Rails: Can reach both Entrance Stairs-Number of Steps: 8   Home Layout: One level Home Equipment: Cane - single point;Rolling Walker (2 wheels)      Prior Function Prior Level of Function : Independent/Modified Independent;History of Falls (last six months);Driving             Mobility Comments: amb with no AD ADLs Comments: MOD I-I in ADL/IADL, retired      Extremity/Trunk Assessment   Upper Extremity Assessment Upper Extremity Assessment: Right hand dominant;RUE deficits/detail;Generalized weakness RUE Deficits / Details: unable to tolerate any R shoulder ROM, c/o pain but less so with elbow and wrist attempts. Pt able to minimally  wiggle fingers, flex/extend wrist; labored, minimal AROM R elbow flx/ext RUE: Unable to fully assess due to pain    Lower Extremity Assessment Lower Extremity Assessment: Overall WFL for tasks assessed       Communication   Communication Communication: Impaired Factors Affecting Communication: Difficulty expressing self;Reduced clarity of speech    Cognition Arousal: Alert Behavior During Therapy: Flat affect                             Following commands: Impaired (wife reports cognition is altered, he did show good effort trying to follow commands) Following commands impaired: Follows one step commands with increased time     Cueing       General Comments General comments (skin integrity, edema, etc.): Pt wanting to doft O2, sats dropped to the 80s relatively quickly, reapplied and back to mid/low 90s    Exercises     Assessment/Plan    PT Assessment Patient needs continued PT services  PT Problem List Decreased strength;Decreased range of motion;Decreased activity tolerance;Decreased balance;Decreased mobility;Decreased coordination;Decreased cognition;Decreased knowledge of use of DME;Decreased safety awareness;Cardiopulmonary status limiting activity;Decreased knowledge of precautions;Pain       PT Treatment Interventions DME instruction;Stair training;Gait training;Functional mobility training;Therapeutic activities;Therapeutic exercise;Balance training;Neuromuscular re-education;Cognitive remediation;Patient/family education;Wheelchair mobility training    PT Goals (Current goals can be found in the Care Plan section)  Acute Rehab PT Goals Patient Stated Goal: get moving  again PT Goal Formulation: With patient/family Time For Goal Achievement: 12/11/23 Potential to Achieve Goals: Fair    Frequency Min 1X/week     Co-evaluation               AM-PAC PT "6 Clicks" Mobility  Outcome Measure Help needed turning from your back to your side while in a flat bed without using bedrails?: Total Help needed moving from lying on your back to sitting on the side of a flat bed without using bedrails?: Total Help needed moving to and from a bed to a chair (including a wheelchair)?: Total Help needed standing up from a chair using your arms (e.g., wheelchair or bedside chair)?: Total Help needed to walk in hospital room?: Total Help needed climbing 3-5 steps with a railing? : Total 6 Click Score: 6    End of Session   Activity Tolerance: Patient limited by pain;Patient limited by fatigue Patient left: in bed;with call bell/phone within reach;with family/visitor present   PT Visit Diagnosis: Unsteadiness on feet (R26.81);Muscle weakness (generalized) (M62.81);History of falling (Z91.81);Other symptoms and signs involving the nervous system (R29.898);Hemiplegia and hemiparesis;Pain Hemiplegia - Right/Left: Right Hemiplegia - dominant/non-dominant: Dominant Hemiplegia - caused by: Cerebral infarction Pain - Right/Left: Right Pain - part  of body: Shoulder    Time: 1610-9604 PT Time Calculation (min) (ACUTE ONLY): 20 min   Charges:   PT Evaluation $PT Eval Moderate Complexity: 1 Mod   PT General Charges $$ ACUTE PT VISIT: 1 Visit         Malachi Pro, PDT 11/28/2023, 4:54 PM

## 2023-11-28 NOTE — Progress Notes (Signed)
 MEWS Progress Note  Patient Details Name: Jose Cuevas MRN: [id number] DOB: 1960-11-28 Today's Date: 11/28/2023   MEWS Flowsheet Documentation:  Assess: MEWS Score Temp: 98.6 F (37 C) BP: 134/80 MAP (mmHg): 96 Pulse Rate: (!) 116 ECG Heart Rate: (!) 108 Resp: 20 Level of Consciousness: Alert SpO2: 96 % O2 Device: Nasal Cannula O2 Flow Rate (L/min): 2 L/min Assess: MEWS Score MEWS Temp: 0 MEWS Systolic: 0 MEWS Pulse: 2 MEWS RR: 0 MEWS LOC: 0 MEWS Score: 2 MEWS Score Color: Yellow Assess: SIRS CRITERIA SIRS Temperature : 0 SIRS Respirations : 0 SIRS Pulse: 1 SIRS WBC: 0 SIRS Score Sum : 1 Assess: if the MEWS score is Yellow or Red Were vital signs accurate and taken at a resting state?: Yes Does the patient meet 2 or more of the SIRS criteria?: No MEWS guidelines implemented : Yes, yellow Treat MEWS Interventions: Considered administering scheduled or prn medications/treatments as ordered Take Vital Signs Increase Vital Sign Frequency : Yellow: Q2hr x1, continue Q4hrs until patient remains green for 12hrs Escalate MEWS: Escalate: Yellow: Discuss with charge nurse and consider notifying provider and/or RRT Notify: Charge Nurse/RN Name of Charge Nurse/RN Notified: Counsellor Provider Notification Provider Name/Title: Gillis Santa MD Date Provider Notified: 11/28/23 Time Provider Notified: 1616 Method of Notification: Page Notification Reason: Other (Comment) (HR elevated, yellow MEWS) Provider response: No new orders (give prn pain med) Date of Provider Response: 11/28/23 Time of Provider Response: 1619      Nghia Mcentee 11/28/2023, 4:27 PM

## 2023-11-28 NOTE — Plan of Care (Signed)
  Problem: Clinical Measurements: Goal: Diagnostic test results will improve Outcome: Progressing   Problem: Clinical Measurements: Goal: Ability to maintain clinical measurements within normal limits will improve Outcome: Progressing Goal: Diagnostic test results will improve Outcome: Progressing   Problem: Nutrition: Goal: Adequate nutrition will be maintained Outcome: Progressing   Problem: Pain Managment: Goal: General experience of comfort will improve and/or be controlled Outcome: Progressing

## 2023-11-28 NOTE — Progress Notes (Signed)
   11/28/23 2155  Assess: MEWS Score  Temp 99.7 F (37.6 C)  BP (!) 144/80  MAP (mmHg) 97  Pulse Rate (!) 121  Resp (!) 24  Level of Consciousness Alert  SpO2 94 %  O2 Device Nasal Cannula  Patient Activity (if Appropriate) In bed  O2 Flow Rate (L/min) 2 L/min  Assess: MEWS Score  MEWS Temp 0  MEWS Systolic 0  MEWS Pulse 2  MEWS RR 1  MEWS LOC 0  MEWS Score 3  MEWS Score Color Yellow  Assess: if the MEWS score is Yellow or Red  Were vital signs accurate and taken at a resting state? Yes  Does the patient meet 2 or more of the SIRS criteria? Yes  Does the patient have a confirmed or suspected source of infection? Yes  MEWS guidelines implemented  No, previously yellow, continue vital signs every 4 hours  Notify: Charge Nurse/RN  Name of Charge Nurse/RN Notified Jackie, RN  Assess: SIRS CRITERIA  SIRS Temperature  0  SIRS Respirations  1  SIRS Pulse 1  SIRS WBC 0  SIRS Score Sum  2

## 2023-11-28 NOTE — Progress Notes (Signed)
 SLP Cancellation Note  Patient Details Name: Jose Cuevas MRN: [id number] DOB: [birth year]   Cancelled treatment:       Reason Eval/Treat Not Completed:  (chart reviewed; consulted NSG/MD and met w/ Family and pt in room.)   Reviewed chart notes; Neurology note. Met w/ pt and Family in room. Pt admitted w/ Fall. 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 Associated hemorrhage or mass effect. 2. Solitary chronic microhemorrhage and mild for age underlying white matter signal changes, most commonly due to chronic small vessel disease. Per Family in room, pt is communicating w/ them "at his Baseline" -- he is [facility] and does NOT wear bottom Denture plate(which can impact precision of certain speech sounds). Pt is answering basic questions, following basic commands, and responds in general conversation -- discussed the importance of Hearing for accurate responses also(encouraged lowering tv volume, closing door, speaking directly in front of the pt, increasing volume of voice a little, and slowing down when talking to him).  Encouraged Family and pt to have Speech Therapy f/u upon return home(known environment)/at next venue of care in order to assess pt's speech-language needs in his ADLs to determine IF any change from his Baseline is occurring. Family stated pt's speech was "really at" his usual Baseline.    Family fully agreed that pt did not have any Acute needs currently but that f/u at Discharge would be considered IF needs indicated. Discussed the above w/ NSG/MD. MD to reconsult if new needs arise during this admit.     Jerilynn Som, MS, CCC-SLP Speech Language Pathologist Rehab Services; [facility] [phone] (ascom) Nasri Boakye 11/28/2023, 3:25 PM

## 2023-11-28 NOTE — Progress Notes (Signed)
 NEUROLOGY CONSULTATION NOTE   Date of service: November 28, 2023 Patient Name: Jose Cuevas MRN:  [id number] DOB:  [birth year] Reason for consult: first-time seizure Requesting physician: Dr. Gillis Santa _ _ _   _ __   _ __ _ _  __ __   _ __   __ _  History of Present Illness   63 yo man with pmhx significant for HL, HTN, gastroparesis, RA, OSA who presented to hospital after first lifetime GTC. He has no memory of the event. He fell during the event and had R shoulder pain after. He now reports pain in his neck and all extremities limiting exam. EEG WNL. No further seizures since admission. He is on azithromycin for CAP.   ROS   Per HPI: all other systems reviewed and are negative  Past History   I have reviewed the following:  Past Medical History:  Diagnosis Date   Back pain    Collagen vascular disease (HCC)    Rhematoid Arthritis hx.   Constipation, chronic    DDD (degenerative disc disease), cervical    History of duodenal ulcer    History of kidney stones    Hyperlipidemia    Hypertension    Low kidney function    LT Kidney non-functioning   Nondiabetic gastroparesis    Peripheral blood vessel disorder (HCC)    Rheumatoid arthritis with rheumatoid factor (HCC)    Sleep apnea    Weight loss    Past Surgical History:  Procedure Laterality Date   BACK SURGERY     COLONOSCOPY WITH PROPOFOL N/A 11/11/2015   Procedure: COLONOSCOPY WITH PROPOFOL;  Surgeon: Elnita Maxwell, MD;  Location: [facility] ENDOSCOPY;  Service: Endoscopy;  Laterality: N/A;   COLONOSCOPY WITH PROPOFOL N/A 12/02/2015   Procedure: COLONOSCOPY WITH PROPOFOL;  Surgeon: Elnita Maxwell, MD;  Location: [facility] ENDOSCOPY;  Service: Endoscopy;  Laterality: N/A;   COLONOSCOPY WITH PROPOFOL N/A 09/16/2019   Procedure: COLONOSCOPY WITH PROPOFOL;  Surgeon: Toledo, Boykin Nearing, MD;  Location: [facility] ENDOSCOPY;  Service: Gastroenterology;  Laterality: N/A;   ESOPHAGOGASTRODUODENOSCOPY (EGD) WITH PROPOFOL N/A  11/11/2015   Procedure: ESOPHAGOGASTRODUODENOSCOPY (EGD) WITH PROPOFOL;  Surgeon: Elnita Maxwell, MD;  Location: [facility] ENDOSCOPY;  Service: Endoscopy;  Laterality: N/A;   ESOPHAGOGASTRODUODENOSCOPY (EGD) WITH PROPOFOL N/A 12/02/2015   Procedure: ESOPHAGOGASTRODUODENOSCOPY (EGD) WITH PROPOFOL;  Surgeon: Elnita Maxwell, MD;  Location: [facility] ENDOSCOPY;  Service: Endoscopy;  Laterality: N/A;   ESOPHAGOGASTRODUODENOSCOPY (EGD) WITH PROPOFOL N/A 01/20/2016   Procedure: ESOPHAGOGASTRODUODENOSCOPY (EGD) WITH PROPOFOL;  Surgeon: Elnita Maxwell, MD;  Location: [facility] ENDOSCOPY;  Service: Endoscopy;  Laterality: N/A;   ESOPHAGOGASTRODUODENOSCOPY (EGD) WITH PROPOFOL N/A 09/16/2019   Procedure: ESOPHAGOGASTRODUODENOSCOPY (EGD) WITH PROPOFOL;  Surgeon: Toledo, Boykin Nearing, MD;  Location: [facility] ENDOSCOPY;  Service: Gastroenterology;  Laterality: N/A;   KNEE RECONSTRUCTION Left    Family History  Problem Relation Age of Onset   Diabetes Mother    Diabetes Father    Heart attack Maternal Grandmother    COPD Brother        08/2020   Tourette syndrome Son        in prison 2019   Learning disabilities Son    Social History   Socioeconomic History   Marital status: Married    Spouse name: Not on file   Number of children: Not on file   Years of education: Not on file   Highest education level: Not on file  Occupational History   Not on file  Tobacco  Use   Smoking status: Some Days    Types: Cigars   Smokeless tobacco: Never   Tobacco comments:    Occasional  3 a day  Vaping Use   Vaping status: Never Used  Substance and Sexual Activity   Alcohol use: No   Drug use: No   Sexual activity: Yes    Birth control/protection: None  Other Topics Concern   Not on file  Social History Narrative   Not on file   Social Drivers of Health   Financial Resource Strain: Not on file  Food Insecurity: No Food Insecurity (11/27/2023)   Hunger Vital Sign    Worried About Running Out of Food in the  Last Year: Never true    Ran Out of Food in the Last Year: Never true  Transportation Needs: No Transportation Needs (11/27/2023)   PRAPARE - Administrator, Civil Service (Medical): No    Lack of Transportation (Non-Medical): No  Physical Activity: Not on file  Stress: Not on file  Social Connections: Socially Isolated (11/27/2023)   Social Connection and Isolation Panel [NHANES]    Frequency of Communication with Friends and Family: Once a week    Frequency of Social Gatherings with Friends and Family: Once a week    Attends Religious Services: Never    Database administrator or Organizations: No    Attends Engineer, structural: Never    Marital Status: Married   No Known Allergies  Medications   Medications Prior to Admission  Medication Sig Dispense Refill Last Dose/Taking   albuterol (VENTOLIN HFA) 108 (90 Base) MCG/ACT inhaler Inhale 2 puffs into the lungs every 6 (six) hours as needed for wheezing or shortness of breath. 8 g 0    Aspirin-Caffeine (BC FAST PAIN RELIEF ARTHRITIS PO) Take by mouth.      azithromycin (ZITHROMAX) 250 MG tablet Take one tab a day for 10 days for uri 10 tablet 0    certolizumab pegol (CIMZIA) 2 X 200 MG KIT Inject 400 mg into the skin every 30 (thirty) days. Done at rheumatology office.      clonazePAM (KLONOPIN) 1 MG tablet Take 1 tablet (1 mg total) by mouth at bedtime as needed for anxiety. 30 tablet 2    DULoxetine (CYMBALTA) 30 MG capsule Take 1 capsule (30 mg total) by mouth at bedtime. 90 capsule 1    Fluticasone-Umeclidin-Vilant (TRELEGY ELLIPTA) 100-62.5-25 MCG/ACT AEPB Inhale 1 puff into the lungs daily. 1 each 11    folic acid (FOLVITE) 1 MG tablet Take 1 mg by mouth daily.      furosemide (LASIX) 20 MG tablet Take one tab po M/W/F 36 tablet 3    hydroxychloroquine (PLAQUENIL) 200 MG tablet Take one tablet TWICE daily weekdays. Take one tablet ONCE daily weekends. Take with food.      losartan-hydrochlorothiazide (HYZAAR)  100-25 MG tablet Take 1 tablet by mouth daily. 90 tablet 1    lubiprostone (AMITIZA) 24 MCG capsule Take 24 mcg by mouth 2 (two) times daily.      metoCLOPramide (REGLAN) 10 MG tablet Take 1 tablet (10 mg total) by mouth 3 (three) times daily before meals. 90 tablet 2    mirtazapine (REMERON) 15 MG tablet Take 1 tablet (15 mg total) by mouth at bedtime. 90 tablet 2    Oxycodone HCl 10 MG TABS Take by mouth.      pantoprazole (PROTONIX) 40 MG tablet Take 1 tablet (40 mg total) by mouth 2 (two)  times daily. 90 tablet 1    Potassium Chloride CR (MICRO-K) 8 MEQ CPCR capsule CR TAKE ONE TAB PO M/W/F WITH LASIX 36 capsule 3    predniSONE (DELTASONE) 10 MG tablet Take one tab 3 x day for 3 days, then take one tab 2 x a day for 3 days and then take one tab a day for 3 days for copd 18 tablet 0    rosuvastatin (CRESTOR) 5 MG tablet TAKE 1 TABLET (5 MG TOTAL) BY MOUTH DAILY. 90 tablet 1    tamsulosin (FLOMAX) 0.4 MG CAPS capsule Take 1 capsule (0.4 mg total) by mouth daily. 90 capsule 3    vitamin B-12 (CYANOCOBALAMIN) 1000 MCG tablet Take 1,000 mcg by mouth daily.         Current Facility-Administered Medications:    0.9 %  sodium chloride infusion, , Intravenous, Continuous, Gillis Santa, MD, Last Rate: 100 mL/hr at 11/28/23 0401, New Bag at 11/28/23 0401   acetaminophen (TYLENOL) tablet 650 mg, 650 mg, Oral, Q6H PRN **OR** acetaminophen (TYLENOL) suppository 650 mg, 650 mg, Rectal, Q6H PRN, Mansy, Jan A, MD   albuterol (PROVENTIL) (2.5 MG/3ML) 0.083% nebulizer solution 2.5 mg, 2.5 mg, Inhalation, Q6H PRN, Mansy, Jan A, MD   azithromycin (ZITHROMAX) 500 mg in sodium chloride 0.9 % 250 mL IVPB, 500 mg, Intravenous, Q24H, Mansy, Jan A, MD, Stopped at 11/27/23 2151   cefTRIAXone (ROCEPHIN) 2 g in sodium chloride 0.9 % 100 mL IVPB, 2 g, Intravenous, Q24H, Mansy, Jan A, MD, Stopped at 11/27/23 2037   chlorpheniramine-HYDROcodone (TUSSIONEX) 10-8 MG/5ML suspension 5 mL, 5 mL, Oral, Q12H PRN, Mansy, Jan A,  MD   clonazePAM (KLONOPIN) tablet 1 mg, 1 mg, Oral, QHS PRN, Mansy, Jan A, MD   cyanocobalamin (VITAMIN B12) tablet 1,000 mcg, 1,000 mcg, Oral, Daily, Mansy, Jan A, MD, 1,000 mcg at 11/27/23 0932   DULoxetine (CYMBALTA) DR capsule 30 mg, 30 mg, Oral, QHS, Mansy, Jan A, MD, 30 mg at 11/27/23 2141   enoxaparin (LOVENOX) injection 40 mg, 40 mg, Subcutaneous, Q24H, Mansy, Jan A, MD, 40 mg at 11/27/23 2037   folic acid (FOLVITE) tablet 1 mg, 1 mg, Oral, Daily, Mansy, Jan A, MD, 1 mg at 11/27/23 4098   guaiFENesin (MUCINEX) 12 hr tablet 600 mg, 600 mg, Oral, BID, Mansy, Jan A, MD, 600 mg at 11/27/23 2138   hydroxychloroquine (PLAQUENIL) tablet 200 mg, 200 mg, Oral, 2 times per day on Monday Tuesday Wednesday Thursday Friday, Mansy, Jan A, MD, 200 mg at 11/27/23 2002   [START ON 11/30/2023] hydroxychloroquine (PLAQUENIL) tablet 200 mg, 200 mg, Oral, Once per day on Sunday Saturday, [place], Jan A, MD   LORazepam (ATIVAN) injection 1 mg, 1 mg, Intravenous, Q1H PRN, Mansy, Jan A, MD   losartan (COZAAR) tablet 100 mg, 100 mg, Oral, Daily, 100 mg at 11/27/23 0930 **AND** [DISCONTINUED] hydrochlorothiazide (HYDRODIURIL) tablet 25 mg, 25 mg, Oral, Daily, Mansy, Jan A, MD   lubiprostone (AMITIZA) capsule 24 mcg, 24 mcg, Oral, BID, Mansy, Jan A, MD, 24 mcg at 11/27/23 2144   magnesium hydroxide (MILK OF MAGNESIA) suspension 30 mL, 30 mL, Oral, Daily PRN, Mansy, Jan A, MD   metoCLOPramide (REGLAN) tablet 10 mg, 10 mg, Oral, TID AC, Mansy, Jan A, MD, 10 mg at 11/27/23 1624   mirtazapine (REMERON) tablet 15 mg, 15 mg, Oral, QHS, Mansy, Jan A, MD, 15 mg at 11/27/23 2137   morphine (PF) 2 MG/ML injection 2 mg, 2 mg, Intravenous, Q4H PRN, Gillis Santa, MD, 2  mg at 11/27/23 2355   ondansetron (ZOFRAN) tablet 4 mg, 4 mg, Oral, Q6H PRN **OR** ondansetron (ZOFRAN) injection 4 mg, 4 mg, Intravenous, Q6H PRN, Mansy, Jan A, MD   oxyCODONE (Oxy IR/ROXICODONE) immediate release tablet 10 mg, 10 mg, Oral, QID PRN, Mansy, Jan A, MD,  10 mg at 11/27/23 1921   pantoprazole (PROTONIX) EC tablet 40 mg, 40 mg, Oral, BID, Mansy, Jan A, MD, 40 mg at 11/27/23 2140   potassium chloride SA (KLOR-CON M) CR tablet 10 mEq, 10 mEq, Oral, Q M,W,F, Mansy, Jan A, MD, 10 mEq at 11/27/23 0932   tamsulosin [facility]) capsule 0.4 mg, 0.4 mg, Oral, Daily, Mansy, Jan A, MD, 0.4 mg at 11/27/23 0930   traZODone (DESYREL) tablet 25 mg, 25 mg, Oral, QHS PRN, Mansy, Jan A, MD, 25 mg at 11/27/23 2139  Vitals   Vitals:   11/27/23 0043 11/27/23 0835 11/27/23 1615 11/28/23 0120  BP: 123/84 136/80 117/70 126/77  Pulse: (!) 103 93 98 (!) 110  Resp: 18 16 17 18   Temp: 98 F (36.7 C) 98.6 F (37 C)  98.2 F (36.8 C)  TempSrc:  Oral    SpO2: 97% 98% 97% 95%  Weight:      Height:         Body mass index is 22.15 kg/m.  Physical Exam   Physical Exam Gen: A&O x4, NAD HEENT: Atraumatic, normocephalic;mucous membranes moist; oropharynx clear, tongue without atrophy or fasciculations. Neck: Supple, trachea midline. Resp: CTAB, no w/r/r CV: RRR, no m/g/r; nml S1 and S2. 2+ symmetric peripheral pulses. Abd: soft/NT/ND; nabs x 4 quad Extrem: Nml bulk; no cyanosis, clubbing, or edema.  Neuro: *MS: A&O x4. Follows multi-step commands.  *Speech: fluid, mild dysarthria, able to name and repeat *CN:    I: Deferred   II,III: PERRLA, VFF by confrontation, optic discs unable to be visualized 2/2 pupillary constriction   III,IV,VI: EOMI w/o nystagmus, no ptosis   V: Sensation intact from V1 to V3 to LT   VII: Eyelid closure was full.  Smile symmetric.   VIII: Hearing intact to voice   IX,X: Voice normal, palate elevates symmetrically    XI: SCM/trap 5/5 bilat   XII: Tongue protrudes midline, no atrophy or fasciculations   *Motor:   Normal bulk.  No tremor, rigidity or bradykinesia. Barely-antigravity in all extremities, symmetric, pain-limited *Sensory: Intact to light touch, pinprick, temperature vibration throughout. Symmetric. Propioception intact  bilat.  No double-simultaneous extinction.  *Coordination:  Finger-to-nose, heel-to-shin, rapid alternating motions were intact. *Reflexes:  2+ and symmetric throughout without clonus; toes down-going bilat *Gait: deferred  NIHSS  Premorbid mRS = 2   Labs   CBC:  Recent Labs  Lab 11/26/23 1709 11/27/23 0620  WBC 21.6* 13.0*  NEUTROABS 12.5*  --   HGB 14.5 12.6*  HCT 44.6 37.7*  MCV 86.4 84.0  PLT 307 236    Basic Metabolic Panel:  Lab Results  Component Value Date   NA 138 11/27/2023   K 5.0 11/27/2023   CO2 19 (L) 11/27/2023   GLUCOSE 125 (H) 11/27/2023   BUN 13 11/27/2023   CREATININE 1.03 11/27/2023   CALCIUM 8.4 (L) 11/27/2023   GFRNONAA >60 11/27/2023   GFRAA >60 04/26/2020   Lipid Panel:  Lab Results  Component Value Date   LDLCALC 60 07/12/2023   HgbA1c: No results found for: "HGBA1C" Urine Drug Screen: No results found for: "LABOPIA", "COCAINSCRNUR", "LABBENZ", "AMPHETMU", "THCU", "LABBARB"  Alcohol Level     Component Value  Date/Time   ETH <10 11/26/2023 1714    MRI Brain pending  rEEG: WNL  Impression   63 yo man with pmhx significant for HL, HTN, gastroparesis, RA, OSA who presented to hospital after first lifetime GTC in the setting of CAP.  Recommendations   - MRI brain wwo contrast - MRI c spine 2/2 neck pain with hx trauma - No indication for AED unless MRI abnormal  Will continue to follow ______________________________________________________________________   Thank you for the opportunity to take part in the care of this patient. If you have any further questions, please contact the neurology consultation attending.  Signed,  Bing Neighbors, MD [facility] [phone]  If 7pm- 7am, please page neurology on call as listed in AMION.  **Any copied and pasted documentation in this note was written by me in another application not billed for and pasted by me into this document.

## 2023-11-29 ENCOUNTER — Inpatient Hospital Stay: Payer: Medicare Other

## 2023-11-29 DIAGNOSIS — J189 Pneumonia, unspecified organism: Secondary | ICD-10-CM | POA: Diagnosis not present

## 2023-11-29 DIAGNOSIS — A419 Sepsis, unspecified organism: Secondary | ICD-10-CM | POA: Diagnosis not present

## 2023-11-29 LAB — BLOOD GAS, VENOUS
Acid-base deficit: 2.3 mmol/L — ABNORMAL HIGH (ref 0.0–2.0)
Bicarbonate: 21.4 mmol/L (ref 20.0–28.0)
O2 Content: 2 L/min
O2 Saturation: 95.4 %
Patient temperature: 37
pCO2, Ven: 33 mm[Hg] — ABNORMAL LOW (ref 44–60)
pH, Ven: 7.42 (ref 7.25–7.43)
pO2, Ven: 70 mm[Hg] — ABNORMAL HIGH (ref 32–45)

## 2023-11-29 LAB — CBC WITH DIFFERENTIAL/PLATELET
Abs Immature Granulocytes: 0.05 10*3/uL (ref 0.00–0.07)
Basophils Absolute: 0.1 10*3/uL (ref 0.0–0.1)
Basophils Relative: 1 %
Eosinophils Absolute: 0.1 10*3/uL (ref 0.0–0.5)
Eosinophils Relative: 1 %
HCT: 34.7 % — ABNORMAL LOW (ref 39.0–52.0)
Hemoglobin: 11.4 g/dL — ABNORMAL LOW (ref 13.0–17.0)
Immature Granulocytes: 1 %
Lymphocytes Relative: 21 %
Lymphs Abs: 1.9 10*3/uL (ref 0.7–4.0)
MCH: 28.6 pg (ref 26.0–34.0)
MCHC: 32.9 g/dL (ref 30.0–36.0)
MCV: 87 fL (ref 80.0–100.0)
Monocytes Absolute: 0.9 10*3/uL (ref 0.1–1.0)
Monocytes Relative: 10 %
Neutro Abs: 5.9 10*3/uL (ref 1.7–7.7)
Neutrophils Relative %: 66 %
Platelets: 176 10*3/uL (ref 150–400)
RBC: 3.99 MIL/uL — ABNORMAL LOW (ref 4.22–5.81)
RDW: 15.5 % (ref 11.5–15.5)
WBC: 8.9 10*3/uL (ref 4.0–10.5)
nRBC: 0 % (ref 0.0–0.2)

## 2023-11-29 LAB — GLUCOSE, CAPILLARY: Glucose-Capillary: 112 mg/dL — ABNORMAL HIGH (ref 70–99)

## 2023-11-29 LAB — COMPREHENSIVE METABOLIC PANEL
ALT: 26 U/L (ref 0–44)
AST: 35 U/L (ref 15–41)
Albumin: 2.7 g/dL — ABNORMAL LOW (ref 3.5–5.0)
Alkaline Phosphatase: 56 U/L (ref 38–126)
Anion gap: 7 (ref 5–15)
BUN: 13 mg/dL (ref 8–23)
CO2: 20 mmol/L — ABNORMAL LOW (ref 22–32)
Calcium: 8.3 mg/dL — ABNORMAL LOW (ref 8.9–10.3)
Chloride: 109 mmol/L (ref 98–111)
Creatinine, Ser: 1.1 mg/dL (ref 0.61–1.24)
GFR, Estimated: >60 mL/min (ref >60)
Glucose, Bld: 113 mg/dL — ABNORMAL HIGH (ref 70–99)
Potassium: 4.1 mmol/L (ref 3.5–5.1)
Sodium: 136 mmol/L (ref 135–145)
Total Bilirubin: 0.9 mg/dL (ref 0.0–1.2)
Total Protein: 5.8 g/dL — ABNORMAL LOW (ref 6.5–8.1)

## 2023-11-29 LAB — RESP PANEL BY RT-PCR (RSV, FLU A&B, COVID)  RVPGX2
Influenza A by PCR: NEGATIVE
Influenza B by PCR: NEGATIVE
Resp Syncytial Virus by PCR: NEGATIVE
SARS Coronavirus 2 by RT PCR: NEGATIVE

## 2023-11-29 LAB — MAGNESIUM: Magnesium: 1.9 mg/dL (ref 1.7–2.4)

## 2023-11-29 LAB — LACTIC ACID, PLASMA
Lactic Acid, Venous: 0.8 mmol/L (ref 0.5–1.9)
Lactic Acid, Venous: 0.7 mmol/L (ref 0.5–1.9)

## 2023-11-29 LAB — CK: Total CK: 318 U/L (ref 49–397)

## 2023-11-29 LAB — PHOSPHORUS: Phosphorus: 3.3 mg/dL (ref 2.5–4.6)

## 2023-11-29 MED ORDER — CLOPIDOGREL BISULFATE 75 MG PO TABS: 75.0000 mg | ORAL_TABLET | Freq: Every day | ORAL | Status: DC

## 2023-11-29 MED ORDER — POLYVINYL ALCOHOL 1.4 % OP SOLN
1.0000 [drp] | OPHTHALMIC | Status: DC | PRN
  Administered 2023-11-29: 1 [drp] via OPHTHALMIC
  Filled 2023-11-29: qty 15

## 2023-11-29 MED ORDER — VITAMIN D (ERGOCALCIFEROL) 1.25 MG (50000 UNIT) PO CAPS
50000.0000 [IU] | ORAL_CAPSULE | ORAL | Status: DC
  Administered 2023-11-29: 50000 [IU] via ORAL
  Filled 2023-11-29: qty 1

## 2023-11-29 MED ORDER — TRAZODONE HCL 50 MG PO TABS
50.0000 mg | ORAL_TABLET | Freq: Every day | ORAL | Status: DC
  Administered 2023-11-29 – 2023-12-04 (×6): 50 mg via ORAL
  Filled 2023-11-29 (×6): qty 1

## 2023-11-29 MED ORDER — SODIUM CHLORIDE 0.9 % IV SOLN: INTRAVENOUS | Status: AC

## 2023-11-29 MED ORDER — ASPIRIN 81 MG PO TBEC
81.0000 mg | DELAYED_RELEASE_TABLET | Freq: Every day | ORAL | Status: DC
  Administered 2023-11-29 – 2023-12-02 (×4): 81 mg via ORAL
  Filled 2023-11-29 (×4): qty 1

## 2023-11-29 MED ORDER — ASPIRIN 81 MG PO TBEC: 81.0000 mg | DELAYED_RELEASE_TABLET | Freq: Every day | ORAL | Status: DC

## 2023-11-29 NOTE — Plan of Care (Signed)
  Problem: Fluid Volume: Goal: Hemodynamic stability will improve Outcome: Progressing   Problem: Clinical Measurements: Goal: Diagnostic test results will improve Outcome: Progressing Goal: Signs and symptoms of infection will decrease Outcome: Progressing   Problem: Respiratory: Goal: Ability to maintain adequate ventilation will improve Outcome: Progressing   Problem: Education: Goal: Knowledge of General Education information will improve Description: Including pain rating scale, medication(s)/side effects and non-pharmacologic comfort measures Outcome: Progressing   Problem: Health Behavior/Discharge Planning: Goal: Ability to manage health-related needs will improve Outcome: Progressing   Problem: Clinical Measurements: Goal: Ability to maintain clinical measurements within normal limits will improve Outcome: Progressing Goal: Will remain free from infection Outcome: Progressing Goal: Diagnostic test results will improve Outcome: Progressing Goal: Respiratory complications will improve Outcome: Progressing Goal: Cardiovascular complication will be avoided Outcome: Progressing   Problem: Activity: Goal: Risk for activity intolerance will decrease Outcome: Progressing   Problem: Nutrition: Goal: Adequate nutrition will be maintained Outcome: Progressing   Problem: Coping: Goal: Level of anxiety will decrease Outcome: Progressing   Problem: Elimination: Goal: Will not experience complications related to bowel motility Outcome: Progressing Goal: Will not experience complications related to urinary retention Outcome: Progressing   Problem: Pain Managment: Goal: General experience of comfort will improve and/or be controlled Outcome: Progressing   Problem: Safety: Goal: Ability to remain free from injury will improve Outcome: Progressing   Problem: Skin Integrity: Goal: Risk for impaired skin integrity will decrease Outcome: Progressing   Problem:  Education: Goal: Knowledge of disease or condition will improve Outcome: Progressing Goal: Knowledge of secondary prevention will improve (MUST DOCUMENT ALL) Outcome: Progressing Goal: Knowledge of patient specific risk factors will improve (DELETE if not current risk factor) Outcome: Progressing   Problem: Ischemic Stroke/TIA Tissue Perfusion: Goal: Complications of ischemic stroke/TIA will be minimized Outcome: Progressing   Problem: Coping: Goal: Will verbalize positive feelings about self Outcome: Progressing Goal: Will identify appropriate support needs Outcome: Progressing   Problem: Health Behavior/Discharge Planning: Goal: Ability to manage health-related needs will improve Outcome: Progressing Goal: Goals will be collaboratively established with patient/family Outcome: Progressing   Problem: Self-Care: Goal: Ability to participate in self-care as condition permits will improve Outcome: Progressing Goal: Verbalization of feelings and concerns over difficulty with self-care will improve Outcome: Progressing Goal: Ability to communicate needs accurately will improve Outcome: Progressing   Problem: Nutrition: Goal: Risk of aspiration will decrease Outcome: Progressing Goal: Dietary intake will improve Outcome: Progressing

## 2023-11-29 NOTE — Progress Notes (Signed)
 NEUROLOGY CONSULT FOLLOW UP NOTE   Date of service: November 29, 2023 Patient Name: Jose Cuevas MRN:  [id number] DOB:  01-14-1961  Interval Hx/subjective   - No further clinical seizure activity - MRI brain showed several scattered acute infarcts L MCA territory   Vitals   Vitals:   11/29/23 0139 11/29/23 0343 11/29/23 0444 11/29/23 0551  BP: 137/83 (!) 152/93 (!) 140/78 (!) 155/91  Pulse: (!) 121 (!) 117 (!) 105 (!) 110  Resp: (!) 25 (!) 26 (!) 24 (!) 26  Temp: 100.1 F (37.8 C) (!) 101.2 F (38.4 C) 97.8 F (36.6 C) 98.6 F (37 C)  TempSrc: Axillary Axillary Oral Oral  SpO2: 95% 96% 98% 98%  Weight:      Height:         Body mass index is 22.15 kg/m.  Exam   Physical Exam Gen: A&O x4, NAD HEENT: Atraumatic, normocephalic;mucous membranes moist; oropharynx clear, tongue without atrophy or fasciculations. Neck: Supple, trachea midline. Resp: CTAB, no w/r/r CV: RRR, no m/g/r; nml S1 and S2. 2+ symmetric peripheral pulses. Abd: soft/NT/ND; nabs x 4 quad Extrem: Nml bulk; no cyanosis, clubbing, or edema.   Neuro: *MS: A&O x4. Follows multi-step commands.  *Speech: fluid, mild dysarthria, able to name and repeat *CN:    I: Deferred   II,III: PERRLA, VFF by confrontation, optic discs unable to be visualized 2/2 pupillary constriction   III,IV,VI: EOMI w/o nystagmus, no ptosis   V: Sensation intact from V1 to V3 to LT   VII: Eyelid closure was full.  Smile symmetric.   VIII: Hearing intact to voice   IX,X: Voice normal, palate elevates symmetrically    XI: SCM/trap 5/5 bilat   XII: Tongue protrudes midline, no atrophy or fasciculations  *Motor:   Normal bulk.  No tremor, rigidity or bradykinesia. Barely-antigravity in all extremities, slightly weaker on the R, limited by pain and effort *Sensory: Intact to light touch, pinprick, temperature vibration throughout. Symmetric. Propioception intact bilat.  No double-simultaneous extinction.  *Coordination:  UTA 2/2  pain *Reflexes:  2+ and symmetric throughout without clonus; toes down-going bilat *Gait: deferred   NIHSS components Score: Comment  1a Level of Conscious 0[x]  1[]  2[]  3[]      1b LOC Questions 0[x]  1[]  2[]       1c LOC Commands 0[x]  1[]  2[]       2 Best Gaze 0[x]  1[]  2[]       3 Visual 0[x]  1[]  2[]  3[]      4 Facial Palsy 0[x]  1[]  2[]  3[]      5a Motor Arm - left 0[]  1[]  2[x]  3[]  4[]  UN[]    5b Motor Arm - Right 0[]  1[]  2[x]  3[]  4[]  UN[]    6a Motor Leg - Left 0[]  1[]  2[x]  3[]  4[]  UN[]    6b Motor Leg - Right 0[]  1[]  2[x]  3[]  4[]  UN[]    7 Limb Ataxia 0[x]  1[]  2[]  3[]  UN[]     8 Sensory 0[x]  1[]  2[]  UN[]      9 Best Language 0[x]  1[]  2[]  3[]      10 Dysarthria 0[]  1[x]  2[]  UN[]      11 Extinct. 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  Medications  Current Facility-Administered Medications:     stroke: early stages of recovery book, , Does not apply, Once, Gillis Santa, MD   0.9 %  sodium chloride infusion, , Intravenous, Continuous, Gillis Santa, MD, Last Rate: 75 mL/hr at 11/28/23  2155, Restarted at 11/28/23 2155   acetaminophen (TYLENOL) tablet 650 mg, 650 mg, Oral, Q6H PRN, 650 mg at 11/29/23 0258 **OR** acetaminophen (TYLENOL) suppository 650 mg, 650 mg, Rectal, Q6H PRN, Mansy, Jan A, MD   albuterol (PROVENTIL) (2.5 MG/3ML) 0.083% nebulizer solution 2.5 mg, 2.5 mg, Inhalation, Q6H PRN, Mansy, Jan A, MD   aspirin EC tablet 81 mg, 81 mg, Oral, Daily, Gillis Santa, MD, 81 mg at 11/28/23 1218   cefTRIAXone (ROCEPHIN) 2 g in sodium chloride 0.9 % 100 mL IVPB, 2 g, Intravenous, Q24H, Mansy, Jan A, MD, Stopped at 11/29/23 0021   chlorpheniramine-HYDROcodone (TUSSIONEX) 10-8 MG/5ML suspension 5 mL, 5 mL, Oral, Q12H PRN, Mansy, Jan A, MD   clonazePAM Scarlette Calico) tablet 1 mg, 1 mg, Oral, QHS PRN, Mansy, Jan A, MD, 1 mg at 11/28/23 2214   clopidogrel (PLAVIX) tablet 75 mg, 75 mg, Oral, Daily, Gillis Santa, MD, 75 mg at 11/28/23 1218   cyanocobalamin (VITAMIN B12) tablet  1,000 mcg, 1,000 mcg, Oral, Daily, Mansy, Jan A, MD, 1,000 mcg at 11/28/23 1006   DULoxetine (CYMBALTA) DR capsule 30 mg, 30 mg, Oral, QHS, Mansy, Jan A, MD, 30 mg at 11/28/23 2215   enoxaparin (LOVENOX) injection 40 mg, 40 mg, Subcutaneous, Q24H, Mansy, Jan A, MD, 40 mg at 11/28/23 2215   folic acid (FOLVITE) tablet 1 mg, 1 mg, Oral, Daily, Mansy, Jan A, MD, 1 mg at 11/28/23 1005   guaiFENesin (MUCINEX) 12 hr tablet 600 mg, 600 mg, Oral, BID, Mansy, Jan A, MD, 600 mg at 11/28/23 2214   hydroxychloroquine (PLAQUENIL) tablet 200 mg, 200 mg, Oral, 2 times per day on Monday Tuesday Wednesday Thursday Friday, Mansy, Jan A, MD, 200 mg at 11/28/23 2215   [START ON 11/30/2023] hydroxychloroquine (PLAQUENIL) tablet 200 mg, 200 mg, Oral, Once per day on Sunday Saturday, Mansy, Jan A, MD   [COMPLETED] levETIRAcetam (KEPPRA) IVPB 1500 mg/ 100 mL premix, 1,500 mg, Intravenous, Once, Stopped at 11/28/23 2230 **FOLLOWED BY** levETIRAcetam (KEPPRA) tablet 500 mg, 500 mg, Oral, BID, Jefferson Fuel, MD   LORazepam (ATIVAN) injection 1 mg, 1 mg, Intravenous, Q1H PRN, Mansy, Jan A, MD   lubiprostone (AMITIZA) capsule 24 mcg, 24 mcg, Oral, BID, Mansy, Jan A, MD, 24 mcg at 11/28/23 2215   magnesium hydroxide (MILK OF MAGNESIA) suspension 30 mL, 30 mL, Oral, Daily PRN, Mansy, Jan A, MD   metoCLOPramide (REGLAN) tablet 10 mg, 10 mg, Oral, TID AC, Mansy, Jan A, MD, 10 mg at 11/28/23 1714   mirtazapine (REMERON) tablet 15 mg, 15 mg, Oral, QHS, Mansy, Jan A, MD, 15 mg at 11/28/23 2214   morphine (PF) 2 MG/ML injection 2 mg, 2 mg, Intravenous, Q4H PRN, Gillis Santa, MD, 2 mg at 11/27/23 2355   ondansetron (ZOFRAN) tablet 4 mg, 4 mg, Oral, Q6H PRN **OR** ondansetron (ZOFRAN) injection 4 mg, 4 mg, Intravenous, Q6H PRN, Mansy, Jan A, MD   oxyCODONE (Oxy IR/ROXICODONE) immediate release tablet 10 mg, 10 mg, Oral, QID PRN, Mansy, Jan A, MD, 10 mg at 11/28/23 1714   pantoprazole (PROTONIX) EC tablet 40 mg, 40 mg, Oral, BID, Mansy,  Jan A, MD, 40 mg at 11/28/23 2214   potassium chloride (KLOR-CON M) CR tablet 10 mEq, 10 mEq, Oral, Q M,W,F, Mansy, Jan A, MD, 10 mEq at 11/27/23 0932   sodium bicarbonate tablet 650 mg, 650 mg, Oral, TID, Gillis Santa, MD, 650 mg at 11/28/23 2214   tamsulosin (FLOMAX) capsule 0.4 mg, 0.4 mg, Oral, Daily, Mansy, Jan A,  MD, 0.4 mg at 11/28/23 1008   traZODone (DESYREL) tablet 25 mg, 25 mg, Oral, QHS PRN, Mansy, Jan A, MD, 25 mg at 11/28/23 2214  Labs and Diagnostic Imaging   CBC:  Recent Labs  Lab 11/26/23 1709 11/27/23 0620 11/28/23 0559 11/29/23 0456  WBC 21.6*   < > 10.4 8.9  NEUTROABS 12.5*  --   --  5.9  HGB 14.5   < > 11.2* 11.4*  HCT 44.6   < > 34.0* 34.7*  MCV 86.4   < > 85.2 87.0  PLT 307   < > 199 176   < > = values in this interval not displayed.    Basic Metabolic Panel:  Lab Results  Component Value Date   NA 136 11/29/2023   K 4.1 11/29/2023   CO2 20 (L) 11/29/2023   GLUCOSE 113 (H) 11/29/2023   BUN 13 11/29/2023   CREATININE 1.10 11/29/2023   CALCIUM 8.3 (L) 11/29/2023   GFRNONAA >60 11/29/2023   GFRAA >60 04/26/2020   Lipid Panel:  Lab Results  Component Value Date   LDLCALC 47 11/28/2023   HgbA1c: No results found for: "HGBA1C" Urine Drug Screen: No results found for: "LABOPIA", "COCAINSCRNUR", "LABBENZ", "AMPHETMU", "THCU", "LABBARB"  Alcohol Level     Component Value Date/Time   ETH <10 11/26/2023 1714   INR  Lab Results  Component Value Date   INR 1.2 11/27/2023   APTT No results found for: "APTT" AED levels: No results found for: "PHENYTOIN", "ZONISAMIDE", "LAMOTRIGINE", "LEVETIRACETA"  CT Head without contrast(Personally reviewed):   CT angio Head and Neck with contrast(Personally reviewed): 1. No emergent finding. 2. 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 3. Extensive intracranial atheromatous irregularity without correctable, proximal flow  reducing stenosis.  MRI Brain(Personally reviewed): 1. 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 No associated hemorrhage or mass effect. 2. No other acute intracranial abnormality. Solitary chronic microhemorrhage and mild for age underlying white matter signal changes, most commonly due to chronic small vessel disease. 3. Bilateral mastoid effusions, likely postinflammatory. No complicating features are identified.   rEEG:  Normal  TTE 1. Left ventricular ejection fraction, by estimation, is 35 to 40% . The left ventricle has moderately decreased function. The left ventricle has no regional wall motion abnormalities. Indeterminate diastolic filling due to E- A fusion. 2. Right ventricular systolic function is normal. The right ventricular size is normal. There is normal pulmonary artery systolic pressure. 3. The mitral valve is normal in structure. Moderate mitral valve regurgitation. No evidence of mitral stenosis. 4. The aortic valve is normal in structure. Aortic valve regurgitation is moderate. No aortic stenosis is present. 5. The inferior vena cava is normal in size with greater than 50% respiratory variability, suggesting right atrial pressure of 3 mmHg.  Assessment   Jose Cuevas is a 63 y.o. 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  Recommendations  - Patient will require ambulatory cardiac monitoring upon discharge, otherwise stroke workup is completed. - ASA 81mg  daily + plavix 75mg  daily x90 days f/b ASA 81mg  daily monotherapy after that - Continue keppra 500mg  bid - I will arrange outpatient neuro f/u  Neurology to sign off, please re-engage if additional neurologic concerns arise. ______________________________________________________________________   Signed, Jefferson Fuel, MD [facility]

## 2023-11-29 NOTE — Plan of Care (Signed)
  Problem: Fluid Volume: Goal: Hemodynamic stability will improve Outcome: Progressing   Problem: Clinical Measurements: Goal: Diagnostic test results will improve Outcome: Progressing Goal: Signs and symptoms of infection will decrease Outcome: Progressing   Problem: Respiratory: Goal: Ability to maintain adequate ventilation will improve Outcome: Progressing   Problem: Education: Goal: Knowledge of General Education information will improve Description: Including pain rating scale, medication(s)/side effects and non-pharmacologic comfort measures Outcome: Progressing   Problem: Activity: Goal: Risk for activity intolerance will decrease Outcome: Progressing   Problem: Clinical Measurements: Goal: Ability to maintain clinical measurements within normal limits will improve Outcome: Progressing Goal: Will remain free from infection Outcome: Progressing Goal: Diagnostic test results will improve Outcome: Progressing Goal: Respiratory complications will improve Outcome: Progressing Goal: Cardiovascular complication will be avoided Outcome: Progressing

## 2023-11-29 NOTE — Progress Notes (Signed)
   11/29/23 0444  Assess: MEWS Score  Temp 97.8 F (36.6 C)  BP (!) 140/78  MAP (mmHg) 95  Pulse Rate (!) 105  Resp (!) 24  SpO2 98 %  O2 Device Nasal Cannula  Patient Activity (if Appropriate) In bed  O2 Flow Rate (L/min) 2 L/min  Assess: MEWS Score  MEWS Temp 0  MEWS Systolic 0  MEWS Pulse 1  MEWS RR 1  MEWS LOC 0  MEWS Score 2  MEWS Score Color Yellow  Assess: if the MEWS score is Yellow or Red  Were vital signs accurate and taken at a resting state? Yes  Does the patient meet 2 or more of the SIRS criteria? Yes  Does the patient have a confirmed or suspected source of infection? Yes  MEWS guidelines implemented  No, previously red, continue vital signs every 4 hours  Notify: Charge Nurse/RN  Name of Charge Nurse/RN Notified Jackie, RN  Assess: SIRS CRITERIA  SIRS Temperature  0  SIRS Respirations  1  SIRS Pulse 1  SIRS WBC 0  SIRS Score Sum  2

## 2023-11-29 NOTE — Consult Note (Signed)
 ORTHOPAEDIC CONSULTATION  REQUESTING PHYSICIAN: Gillis Santa, MD  Chief Complaint:   R hand pain  History of Present Illness: Jose Cuevas is a 63 y.o. right-hand-dominant male with a past medical history significant for hypertension, hyperlipidemia, rheumatoid arthritis, and OSA who initially presented to the emergency department on 11/26/2023.  He was found by his family to have a seizure and then fell.  He developed significant right shoulder pain after that time.  Shoulder radiographs in the emergency department at that time were read as negative.  CT scan yesterday was read as an impaction fracture.  I was consulted for further evaluation of his right shoulder pain and imaging findings.  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  He was also found to have a pneumonia with signs of sepsis with leukocytosis and tachypnea.  He has been on IV antibiotics.  This was also his first seizure, and this was thought to be prompted by CVA.  He lives at home with his wife, who was present at the bedside today.  The patient is a retired Psychologist, occupational.  He quit smoking approximately 6 weeks ago.  Past Medical History:  Diagnosis Date   Back pain    Collagen vascular disease (HCC)    Rhematoid Arthritis hx.   Constipation, chronic    DDD (degenerative disc disease), cervical    History of duodenal ulcer    History of kidney stones    Hyperlipidemia    Hypertension    Low kidney function    LT Kidney non-functioning   Nondiabetic gastroparesis    Peripheral blood vessel disorder (HCC)    Rheumatoid arthritis with rheumatoid factor (HCC)    Sleep apnea    Weight loss    Past Surgical History:  Procedure Laterality Date   BACK SURGERY     COLONOSCOPY WITH PROPOFOL N/A 11/11/2015   Procedure: COLONOSCOPY WITH PROPOFOL;  Surgeon: Elnita Maxwell, MD;  Location: [facility] ENDOSCOPY;   Service: Endoscopy;  Laterality: N/A;   COLONOSCOPY WITH PROPOFOL N/A 12/02/2015   Procedure: COLONOSCOPY WITH PROPOFOL;  Surgeon: Elnita Maxwell, MD;  Location: [facility] ENDOSCOPY;  Service: Endoscopy;  Laterality: N/A;   COLONOSCOPY WITH PROPOFOL N/A 09/16/2019   Procedure: COLONOSCOPY WITH PROPOFOL;  Surgeon: Toledo, Boykin Nearing, MD;  Location: [facility] ENDOSCOPY;  Service: Gastroenterology;  Laterality: N/A;   ESOPHAGOGASTRODUODENOSCOPY (EGD) WITH PROPOFOL N/A 11/11/2015   Procedure: ESOPHAGOGASTRODUODENOSCOPY (EGD) WITH PROPOFOL;  Surgeon: Elnita Maxwell, MD;  Location: [facility] ENDOSCOPY;  Service: Endoscopy;  Laterality: N/A;   ESOPHAGOGASTRODUODENOSCOPY (EGD) WITH PROPOFOL N/A 12/02/2015   Procedure: ESOPHAGOGASTRODUODENOSCOPY (EGD) WITH PROPOFOL;  Surgeon: Elnita Maxwell, MD;  Location: [facility] ENDOSCOPY;  Service: Endoscopy;  Laterality: N/A;   ESOPHAGOGASTRODUODENOSCOPY (EGD) WITH PROPOFOL N/A 01/20/2016   Procedure: ESOPHAGOGASTRODUODENOSCOPY (EGD) WITH PROPOFOL;  Surgeon: Elnita Maxwell, MD;  Location: [facility] ENDOSCOPY;  Service: Endoscopy;  Laterality: N/A;   ESOPHAGOGASTRODUODENOSCOPY (EGD) WITH PROPOFOL N/A 09/16/2019   Procedure: ESOPHAGOGASTRODUODENOSCOPY (EGD) WITH PROPOFOL;  Surgeon: Toledo, Boykin Nearing, MD;  Location: [facility] ENDOSCOPY;  Service: Gastroenterology;  Laterality: N/A;   KNEE RECONSTRUCTION Left    Social History   Socioeconomic History   Marital status: Married    Spouse name: Not on file   Number of children: Not on file   Years of education: Not on file   Highest education level: Not on file  Occupational History   Not on file  Tobacco Use   Smoking status:  Some Days    Types: Cigars   Smokeless tobacco: Never   Tobacco comments:    Occasional  3 a day  Vaping Use   Vaping status: Never Used  Substance and Sexual Activity   Alcohol use: No   Drug use: No   Sexual activity: Yes    Birth control/protection: None  Other Topics Concern   Not on file   Social History Narrative   Not on file   Social Drivers of Health   Financial Resource Strain: Not on file  Food Insecurity: No Food Insecurity (11/27/2023)   Hunger Vital Sign    Worried About Running Out of Food in the Last Year: Never true    Ran Out of Food in the Last Year: Never true  Transportation Needs: No Transportation Needs (11/27/2023)   PRAPARE - Administrator, Civil Service (Medical): No    Lack of Transportation (Non-Medical): No  Physical Activity: Not on file  Stress: Not on file  Social Connections: Socially Isolated (11/27/2023)   Social Connection and Isolation Panel [NHANES]    Frequency of Communication with Friends and Family: Once a week    Frequency of Social Gatherings with Friends and Family: Once a week    Attends Religious Services: Never    Database administrator or Organizations: No    Attends Engineer, structural: Never    Marital Status: Married   Family History  Problem Relation Age of Onset   Diabetes Mother    Diabetes Father    Heart attack Maternal Grandmother    COPD Brother        08/2020   Tourette syndrome Son        in prison 2019   Learning disabilities Son    No Known Allergies Prior to Admission medications   Medication Sig Start Date End Date Taking? Authorizing Provider  albuterol (VENTOLIN HFA) 108 (90 Base) MCG/ACT inhaler Inhale 2 puffs into the lungs every 6 (six) hours as needed for wheezing or shortness of breath. 10/25/23  Yes McDonough, Lauren K, PA-C  Aspirin-Caffeine (BC FAST PAIN RELIEF ARTHRITIS PO) Take 1 packet by mouth daily as needed.   Yes [provider]  clonazePAM (KLONOPIN) 1 MG tablet Take 1 tablet (1 mg total) by mouth at bedtime as needed for anxiety. 10/10/23  Yes Abernathy, Arlyss Repress, NP  DULoxetine (CYMBALTA) 30 MG capsule Take 1 capsule (30 mg total) by mouth at bedtime. 07/11/23  Yes Abernathy, Arlyss Repress, NP  folic acid (FOLVITE) 1 MG tablet Take 1 mg by mouth daily.   Yes  [provider]  furosemide (LASIX) 20 MG tablet Take one tab po M/W/F Patient taking differently: Take 20 mg by mouth daily. Take one tab po M/W/F 04/23/23  Yes Lyndon Code, MD  lubiprostone (AMITIZA) 24 MCG capsule Take 24 mcg by mouth daily.   Yes [provider]  metoCLOPramide (REGLAN) 10 MG tablet Take 1 tablet (10 mg total) by mouth 3 (three) times daily before meals. Patient taking differently: Take 10 mg by mouth 2 (two) times daily. 07/11/23  Yes Abernathy, Arlyss Repress, NP  mirtazapine (REMERON) 15 MG tablet Take 1 tablet (15 mg total) by mouth at bedtime. 07/11/23  Yes Abernathy, Alyssa, NP  Oxycodone HCl 10 MG TABS Take 60 mg by mouth daily. Takes 10 mg throughout the day per spouse not at once 10/19/20  Yes [provider]  pantoprazole (PROTONIX) 40 MG tablet Take 1 tablet (40 mg total) by  mouth 2 (two) times daily. 07/11/23  Yes Abernathy, Arlyss Repress, NP  Potassium Chloride CR (MICRO-K) 8 MEQ CPCR capsule CR TAKE ONE TAB PO M/W/F WITH LASIX 04/23/23  Yes Lyndon Code, MD  rosuvastatin (CRESTOR) 5 MG tablet TAKE 1 TABLET (5 MG TOTAL) BY MOUTH DAILY. Patient taking differently: Take 5 mg by mouth at bedtime. 09/23/23  Yes Abernathy, Arlyss Repress, NP  tamsulosin (FLOMAX) 0.4 MG CAPS capsule Take 1 capsule (0.4 mg total) by mouth daily. 01/10/23  Yes Abernathy, Arlyss Repress, NP  vitamin B-12 (CYANOCOBALAMIN) 1000 MCG tablet Take 1,000 mcg by mouth daily.   Yes [provider]  Fluticasone-Umeclidin-Vilant (TRELEGY ELLIPTA) 100-62.5-25 MCG/ACT AEPB Inhale 1 puff into the lungs daily. Patient not taking: Reported on 11/28/2023 08/12/23   Sallyanne Kuster, NP  hydroxychloroquine (PLAQUENIL) 200 MG tablet Take one tablet TWICE daily weekdays. Take one tablet ONCE daily weekends. Take with food. Patient not taking: Reported on 11/28/2023 03/14/23   [provider]  leflunomide (ARAVA) 20 MG tablet Take 20 mg by mouth daily. Patient not taking: Reported on 11/28/2023  10/28/23 10/27/24  [provider]   Recent Labs    11/26/23 1709 11/27/23 1610 11/28/23 0559 11/29/23 0456  WBC 21.6* 13.0* 10.4 8.9  HGB 14.5 12.6* 11.2* 11.4*  HCT 44.6 37.7* 34.0* 34.7*  PLT 307 236 199 176  K 3.1* 5.0 4.3 4.1  CL 109 112* 105 109  CO2 17* 19* 19* 20*  BUN 12 13 12 13   CREATININE 1.33* 1.03 1.15 1.10  GLUCOSE 138* 125* 122* 113*  CALCIUM 9.0 8.4* 8.2* 8.3*  INR  --  1.2  --   --    US ARTERIAL ABI (SCREENING LOWER EXTREMITY) Result Date: 11/29/2023 CLINICAL DATA:  Peripheral arterial disease EXAM: NONINVASIVE PHYSIOLOGIC VASCULAR STUDY OF BILATERAL LOWER EXTREMITIES TECHNIQUE: Evaluation of both lower extremities were performed at rest, including calculation of ankle-brachial indices with single level Doppler, pressure and pulse volume recording. COMPARISON:  None Available. FINDINGS: Right ABI:  1.1 Left ABI:  1.0 Right Lower Extremity:  Normal arterial waveforms at the ankle. Left Lower Extremity:  Normal arterial waveforms at the ankle. 1.0-1.4 Normal IMPRESSION: Normal bilateral resting ankle-brachial indices. Electronically Signed   By: Malachy Moan M.D.   On: 11/29/2023 10:50   DG Chest Port 1 View Result Date: 11/29/2023 CLINICAL DATA:  [id number] Fever [id number] Follow-up exam [id number] EXAM: PORTABLE CHEST 1 VIEW COMPARISON:  Chest radiograph 11/26/2023 FINDINGS: Decreased small bilateral pleural effusions. Cardiomegaly. Unchanged cardiac and mediastinal contours. No pneumothorax. No new focal airspace opacity. There are prominent bilateral interstitial opacities that could represent mild pulmonary edema, unchanged from prior exam. No new focal airspace opacity. No radiographically apparent displaced rib fractures visualized upper abdomen is notable for mild colonic gaseous distention. IMPRESSION: Mild pulmonary edema with improved small bilateral pleural effusions. Electronically Signed   By: Lorenza Cambridge M.D.   On: 11/29/2023 08:19   CT HEAD WO  CONTRAST ( ) Result Date: 11/29/2023 CLINICAL DATA:  Mental status change with unknown cause. EXAM: CT HEAD WITHOUT CONTRAST TECHNIQUE: Contiguous axial images were obtained from the base of the skull through the vertex without intravenous contrast. RADIATION DOSE REDUCTION: This exam was performed according to the departmental dose-optimization program which includes automated exposure control, adjustment of the mA and/or kV according to patient size and/or use of iterative reconstruction technique. COMPARISON:  11/28/2023 FINDINGS: Brain: No evidence of acute infarction, hemorrhage, hydrocephalus, extra-axial collection or mass lesion/mass effect. Cerebral volume loss, mainly perisylvian. Vascular:  No hyperdense vessel or unexpected calcification. Skull: Normal. Negative for fracture or focal lesion. Sinuses/Orbits: Partial bilateral mastoid opacification, chronic and with negative nasopharynx. IMPRESSION: Acute left cerebral infarcts by MRI are occult. No hemorrhage or progressive finding. Electronically Signed   By: Tiburcio Pea M.D.   On: 11/29/2023 06:27   CT ANGIO HEAD NECK W WO CM Result Date: 11/29/2023 CLINICAL DATA:  Acute stroke suspected EXAM: CT ANGIOGRAPHY HEAD AND NECK WITH AND WITHOUT CONTRAST TECHNIQUE: Multidetector CT imaging of the head and neck was performed using the standard protocol during bolus administration of intravenous contrast. Multiplanar CT image reconstructions and MIPs were obtained to evaluate the vascular anatomy. Carotid stenosis measurements (when applicable) are obtained utilizing NASCET criteria, using the distal internal carotid diameter as the denominator. RADIATION DOSE REDUCTION: This exam was performed according to the departmental dose-optimization program which includes automated exposure control, adjustment of the mA and/or kV according to patient size and/or use of iterative reconstruction technique. CONTRAST:  75mL OMNIPAQUE IOHEXOL 350 MG/ML SOLN  COMPARISON:  Brain MRI from yesterday.  CTA of the neck 02/03/2021 FINDINGS: CT HEAD FINDINGS Brain: Infarct by prior brain MRI are occult by CT. No evidence of progressive infarct, hydrocephalus, mass, or hemorrhage. Vascular: No hyperdense vessel or unexpected calcification. Skull: Normal. Negative for fracture or focal lesion. Sinuses/Orbits: No acute finding. Review of the MIP images confirms the above findings CTA NECK FINDINGS Aortic arch: Atheromatous plaque without dissection or aneurysm. Two vessel branching. Right carotid system: Atheromatous wall thickening diffusely with mixed density plaque greatest at the bifurcation, proximal ICA stenosis measures nearly 50%. No ulceration or beading although notably tortuous ICA with loop. Left carotid system: Atheromatous wall thickening of the common carotid with fairly bulky mixed density plaque at the proximal ICA causing 60% stenosis measured on coronal reformats. Much of this plaque is low-density with some positive remodeling suspected. No clear ulceration or beading. There is ICA tortuosity in the upper neck with loop. Vertebral arteries: Proximal subclavian atherosclerosis with advanced stenosis on the right measuring 80% on coronal reformats, measurements accentuated by poststenotic dilatation, progressed. Atheromatous plaque at both vertebral ostia with nonvisualized lumen on the left and likely also severe stenosis on the right although visualization is complicated by motion artifact. Symmetrically enhancing vertebral arteries to the dura without beading or dissection. Skeleton: No acute finding Other neck: No acute finding Upper chest: Pulmonary fibrosis and partially covered right pleural fluid, reference recent chest CT. Review of the MIP images confirms the above findings CTA HEAD FINDINGS Anterior circulation: Atheromatous calcification of the cavernous carotids with mild to moderate right supraclinoid ICA stenosis. Extensive atheromatous  irregularity of branch vessels. No branch occlusion, beading, or aneurysm. Posterior circulation: The right vertebral artery is dominant. Extensive atheromatous irregularity. No branch occlusion, beading, or aneurysm. Fetal type right PCA flow. Venous sinuses: Negative Anatomic variants: As above Review of the MIP images confirms the above findings IMPRESSION: 1. No emergent finding. 2. 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 3. Extensive intracranial atheromatous irregularity without correctable, proximal flow reducing stenosis. Electronically Signed   By: Tiburcio Pea M.D.   On: 11/29/2023 05:34   CT HUMERUS RIGHT WO CONTRAST Result Date: 11/28/2023 CLINICAL DATA:  Upper arm pain, history of recent fall EXAM: CT OF THE RIGHT HUMERUS WITHOUT CONTRAST TECHNIQUE: Multidetector CT imaging was performed according to the standard protocol. Multiplanar CT image reconstructions were also generated. RADIATION DOSE REDUCTION: This exam was performed  according to the departmental dose-optimization program which includes automated exposure control, adjustment of the mA and/or kV according to patient size and/or use of iterative reconstruction technique. COMPARISON:  Plain film from 11/26/2023 and CT from 07/18/2023. FINDINGS: Bones/Joint/Cartilage There is a new impaction fracture identified in the medial aspect of the humeral head which conforms to the shape of the posterior aspect of the glenoid. This is likely related to the recent injury as this was not seen on a prior CT from 07/18/2023. The remainder of the humerus is within normal limits. Visualized proximal radius and ulna are unremarkable. The glenoid and remainder of the scapula appear within normal limits. Ligaments Suboptimally assessed by CT. Muscles and Tendons Surrounding musculature appears within normal limits. Soft tissues No focal hematoma or other soft tissue  abnormality is noted. IMPRESSION: Impaction fracture in the medial aspect of the humeral head which conforms to the shape of the adjacent glenoid likely related to the recent injury as this was not seen on prior exams. Electronically Signed   By: Alcide Clever M.D.   On: 11/28/2023 19:14   MR CERVICAL SPINE WO CONTRAST Result Date: 11/28/2023 CLINICAL DATA:  63 year old male with altered mental status, seizure. Found unresponsive. EXAM: MRI CERVICAL SPINE WITHOUT CONTRAST TECHNIQUE: Multiplanar, multisequence MR imaging of the cervical spine was performed. No intravenous contrast was administered. COMPARISON:  Brain MRI without and with contrast the same day reported separately. Cervical spine CT 10/15/2009. FINDINGS: Alignment: Normal cervical lordosis. Vertebrae: Maintained vertebral body height. Normal background bone marrow signal. No marrow edema or evidence of acute osseous abnormality. Cord: Normal.  Capacious spinal canal at most levels. Posterior Fossa, vertebral arteries, paraspinal tissues: Cervicomedullary junction is within normal limits. Brain is detailed separately today. Preserved major vascular flow voids in the visible neck. Negative visible neck soft tissues, grossly negative visible lung apices. Disc levels: C2-C3:  Minor disc bulging and facet hypertrophy.  No stenosis. C3-C4: Small right paracentral disc protrusion (series 12, image 8). Mild facet hypertrophy. Mild ligament flavum hypertrophy. No spinal stenosis. Mild to moderate left C4 foraminal stenosis due to combined disc osteophyte and facet disease. C4-C5: Mild mostly foraminal disc bulging and endplate spurring. Mild to moderate facet and ligament flavum hypertrophy. No spinal stenosis. Moderate to severe left, mild right C5 foraminal stenosis. C5-C6: Disc space loss. Circumferential disc osteophyte complex asymmetric to the left. Mild facet and ligament flavum hypertrophy. Borderline to mild spinal stenosis. Severe left and moderate  to severe right C6 foraminal stenosis. C6-C7:  Mild disc bulging and facet hypertrophy.  No stenosis. C7-T1: Mild central disc bulge or protrusion. Mild facet hypertrophy. No spinal stenosis. Mild bilateral C8 neural foraminal stenosis. No visible upper thoracic spinal stenosis. IMPRESSION: 1. No acute osseous abnormality. Normal cervical spinal cord. 2. 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 Multilevel degenerative neural foraminal stenosis, up to severe at the left C5 and left greater than right C6 nerve levels. Otherwise mild to moderate. Electronically Signed   By: Odessa Fleming M.D.   On: 11/28/2023 04:12   MR BRAIN W WO CONTRAST Result Date: 11/28/2023 CLINICAL DATA:  63 year old male with altered mental status, seizure. Found unresponsive. EXAM: MRI HEAD WITHOUT AND WITH CONTRAST TECHNIQUE: Multiplanar, multiecho pulse sequences of the brain and surrounding structures were obtained without and with intravenous contrast. CONTRAST:  7mL GADAVIST GADOBUTROL 1 MMOL/ML IV SOLN COMPARISON:  Head CT 11/26/2023. Cervical spine MRI the same day reported separately. FINDINGS: Brain: Several punctate foci of  abnormal diffusion in the left hemisphere both the anterior MCA territory (series 8, image 37) and at the posterior left MCA/PCA watershed (image 29). Faint if any associated T2 and FLAIR hyperintensity. No abnormal enhancement identified. No right hemisphere or posterior fossa diffusion abnormality. No midline shift, mass effect, evidence of mass lesion, ventriculomegaly, extra-axial collection or acute intracranial hemorrhage. Mild for age scattered nonspecific cerebral white matter T2 and FLAIR hyperintensity, mostly subcortical. Solitary chronic microhemorrhage in the right parietal lobe series 12, image 42 on SWI. No cortical encephalomalacia identified. Deep gray matter nuclei, brainstem and cerebellum appear negative. Thin slice T2 images demonstrate hippocampal  formations, mesial temporal lobes are symmetric and within normal limits. No abnormal enhancement identified. No dural thickening. Vascular: Major intracranial vascular flow voids are preserved. Following contrast the major dural venous sinuses are enhancing and appear to be patent. Skull and upper cervical spine: Negative for age visible cervical spine and normal background bone marrow signal. Sinuses/Orbits: Negative orbits. Mild bilateral paranasal sinus mucosal thickening. Other: Bilateral mastoid effusions. No associated enhancement or complicating features. Negative visible pharynx. Negative visible scalp and face. IMPRESSION: 1. 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 Associated hemorrhage or mass effect. 2. No other acute intracranial abnormality. Solitary chronic microhemorrhage and mild for age underlying white matter signal changes, most commonly due to chronic small vessel disease. 3. Bilateral mastoid effusions, likely postinflammatory. No complicating features are identified. Electronically Signed   By: Odessa Fleming M.D.   On: 11/28/2023 04:03     Positive ROS: All other systems have been reviewed and were otherwise negative with the exception of those mentioned in the HPI and as above.  Physical Exam: BP 130/77 (BP Location: Left Arm)   Pulse (!) 119   Temp 99.7 F (37.6 C)   Resp 20   Ht 5\' 9"  (1.753 m)   Wt 68 kg   SpO2 96%   BMI 22.15 kg/m  General: Mildly drowsy, but able to respond appropriately, no acute distress Psychiatric:  Patient is competent for consent with normal mood and affect    Orthopedic Exam:  RUE: +ain/pin/u/ motor. Deltoid function intact SILT r/u/m/ax +rad pulse Severe pain with attempted RoM of R shoulder. 0 ER, ~20 deg FF passively.  Imaging:  As above:   Assessment/Plan: 63 y.o. male with right posterior glenohumeral dislocation with very large reverse Hill-Sachs lesion 1.  We discussed the diagnosis as well  as various treatment options.  We discussed the possibility of performing a closed reduction of the glenohumeral joint.  However, given the size of the reverse Hill-Sachs lesion, concern would be that he would be at an extremely high risk of  repeat dislocation.  We discussed a more definitive option would likely involve reverse shoulder arthroplasty, although this would come with increased risks as well.  The patient and his wife would prefer to perform more definitive surgery.  Given his recent medical issues including diagnosis of stroke and pneumonia, we agreed to tentatively plan for surgery on Tuesday, 12/03/23.  This would consist of right reverse shoulder arthroplasty.  2.  Patient may progress with PT/OT.  Restrictions on right upper extremity would consist of sling wear for comfort.  If he is able to perform some basic ADLs, he may do so.  However, given persistent dislocation, it may be too painful for any significant range of motion of the left upper extremity.  No restriction on lower extremity function.    3.  Continue to  medically optimize as able.  4.  Hold Plavix/anticoagulation if not medically contraindicated.  May continue aspirin.  5.  N.p.o. after midnight on Monday, 12/02/2023  Signa Kell   11/29/2023 2:49 PM

## 2023-11-29 NOTE — Care Management Important Message (Signed)
 Important Message  Patient Details  Name: Jose Cuevas MRN: [id number] Date of Birth: April 03, 1961   Important Message Given:  Yes - Medicare IM     Cristela Blue, CMA 11/29/2023, 10:57 AM

## 2023-11-29 NOTE — Progress Notes (Signed)
Patient sent for procedure via bed in stable condition.

## 2023-11-29 NOTE — Progress Notes (Signed)
   11/29/23 0343  Assess: MEWS Score  Temp (!) 101.2 F (38.4 C)  BP (!) 152/93  MAP (mmHg) 110  Pulse Rate (!) 117  ECG Heart Rate (!) 118  Resp (!) 26  Level of Consciousness Alert  SpO2 96 %  O2 Device Nasal Cannula  Patient Activity (if Appropriate) In bed  O2 Flow Rate (L/min) 2 L/min  Assess: MEWS Score  MEWS Temp 1  MEWS Systolic 0  MEWS Pulse 2  MEWS RR 2  MEWS LOC 0  MEWS Score 5  MEWS Score Color Red  Assess: if the MEWS score is Yellow or Red  Were vital signs accurate and taken at a resting state? Yes  Does the patient meet 2 or more of the SIRS criteria? Yes  Does the patient have a confirmed or suspected source of infection? Yes  MEWS guidelines implemented  Yes, red  Treat  MEWS Interventions Considered administering scheduled or prn medications/treatments as ordered  Take Vital Signs  Increase Vital Sign Frequency  Red: Q1hr x2, continue Q4hrs until patient remains green for 12hrs  Escalate  MEWS: Escalate Red: Discuss with charge nurse and notify provider. Consider notifying RRT. If remains red for 2 hours consider need for higher level of care  Notify: Charge Nurse/RN  Name of Charge Nurse/RN Notified Kingsley, RN  Provider Notification  Provider Name/Title Manuela Schwartz, NP  Date Provider Notified 11/29/23  Time Provider Notified 0345  Method of Notification Page  Notification Reason Change in status (Red MEWS)  Provider response See new orders (VBG)  Date of Provider Response 11/29/23  Time of Provider Response 0400  Notify: Rapid Response  Name of Rapid Response RN Notified Maisie Fus, RN  Date Rapid Response Notified 11/29/23  Time Rapid Response Notified 0400  Assess: SIRS CRITERIA  SIRS Temperature  1  SIRS Respirations  1  SIRS Pulse 1  SIRS WBC 0  SIRS Score Sum  3

## 2023-11-29 NOTE — Progress Notes (Signed)
   11/29/23 0139  Assess: MEWS Score  Temp 100.1 F (37.8 C)  BP 137/83  MAP (mmHg) 99  Pulse Rate (!) 121  Resp (!) 25  Level of Consciousness Alert  SpO2 95 %  O2 Device Nasal Cannula  Patient Activity (if Appropriate) In bed  O2 Flow Rate (L/min) 2 L/min  Assess: MEWS Score  MEWS Temp 0  MEWS Systolic 0  MEWS Pulse 2  MEWS RR 1  MEWS LOC 0  MEWS Score 3  MEWS Score Color Yellow  Assess: if the MEWS score is Yellow or Red  Were vital signs accurate and taken at a resting state? Yes  Does the patient meet 2 or more of the SIRS criteria? Yes  Does the patient have a confirmed or suspected source of infection? Yes  MEWS guidelines implemented  No, previously yellow, continue vital signs every 4 hours  Notify: Charge Nurse/RN  Name of Charge Nurse/RN Notified Jackie, RN  Assess: SIRS CRITERIA  SIRS Temperature  0  SIRS Respirations  1  SIRS Pulse 1  SIRS WBC 0  SIRS Score Sum  2

## 2023-11-29 NOTE — Progress Notes (Signed)
 Rapid Response Event Note   Reason for Call : Red Mews     Initial Focused Assessment:  NP and RN ad bedside when I arrived explained that patient had an elevated heart rate and temperature as well as decreased LOC leading to a red mews. The patient had received tylenol less than an hour before the call.      Interventions:  NP Manuela Schwartz ordered a VBG to be done and asked to follow up on temp after tylenol had some time.    Plan of Care:  Remain on 1C for vital signs recheck.    Event Summary:   MD Notified:NP Manuela Schwartz  Call [id number] Arrival Time:0405 End YSAY:3016  Followed up at 0600 patient still comnolent but temprature and heart rate improved no longer a red mews. No needs expressed by bedside RN Betsey Amen, RN

## 2023-11-29 NOTE — Progress Notes (Signed)
 [facility] Progress Note  Patient: Jose Cuevas    ZOX:[id number]  DOA: 11/26/2023     Date of Service: the patient was seen and examined on 11/29/2023  Chief Complaint  Patient presents with   unresponsive   Brief hospital course: Jose Cuevas is a 63 y.o. Caucasian male with medical history significant for hypertension, dyslipidemia, gastroparesis, rheumatoid arthritis, OSA and DDD, who presented to the emergency room with acute onset of tonic-clonic seizure witnessed by his family with postictal confusion and lack of memory about the event.  The patient later had nausea and vomiting.  Did not any fever or chills.  No chest pain or palpitations.  He had right shoulder pain when he fell during his seizure.  The patient denies any significant cough or wheezing or dyspnea.  No paresthesias or focal muscle weakness.  He had no urinary or stool incontinence.  No tongue bites.  No dysuria, oliguria or hematuria or flank pain.   ED Course: When the patient came to the ER, BP was 151/84 with pulse oximetry of 98% on 4 L of O2 by nasal cannula and later respiratory rate was 30 and otherwise normal vital signs.  Labs revealed hypokalemia 3.1 and creatinine 1.33 above previous levels.  High-sensitivity troponin I was 54 and later 125.  Lactic acid was 4.4 and later 2.5.  CBC showed leukocytosis 21.6 with neutrophilia. EKG as reviewed by me : EKG showed sinus rhythm with rate of 92 and left bundle branch block. Imaging: Right shoulder x-ray showed osteoarthritis with no acute displaced fracture.  It showed emphysema with stable right pleural thickening versus small pleural effusion.  Portable chest x-ray showed no acute cardiopulmonary disease. Chest CTA revealed the following: 1. No evidence of significant pulmonary embolus. 2. Cardiac enlargement with left ventricular hypertrophy. 3. Probable loculated collection in the right pleural space, possibly related to infection. Infiltrates in the left 4. Small  left pleural effusion with basilar atelectasis. 5. Diffuse emphysematous changes throughout the lungs with subpleural fibrosis. 6. Multiple pulmonary nodules. Most significant: Right solid pulmonary nodule within the upper lobe measuring 7 mm. Per Fleischner Society Guidelines, recommend a non-contrast Chest CT at 3-6 months, then another non-contrast Chest CT at 18-24 months. 7. Hilar and mediastinal lymphadenopathy is nonspecific and could represent reactive change versus lymphoproliferative process. Similar appearance to previous study. 8. 4.2 cm diameter ascending thoracic aortic aneurysm. Recommend annual imaging followup by CTA or MRA. 9. Aortic atherosclerosis.   Noncontrasted head CT scan revealed no acute intracranial normalities.  It showed bilateral mastoid effusions.  The patient was given IV Rocephin and Zithromax, p.o. ibuprofen, 4 mg of IV Zofran, 40 mill Cabbell of p.o. potassium chloride and 1 L bolus of IV normal saline.  He will be admitted to a medical telemetry bed for further evaluation and management.   Assessment and Plan:  # Acute left MCA infarct MRI: Positive for several punctate acute infarcts scattered in the Left MCA territory. Associated hemorrhage or mass effect. DAPT x 90 days f/b ASA 81 mg pod LDL 47, below goal 70, TSH 1.8 within normal range Held statin due to elevated CK level, but he does not need a statin as per neurology because LDL is below goal. Continue telemetry Continue NIH stroke scale as per protocol Follow PT OT and SLP TTE LVEF 35 to 40%, no WMA, moderate MR and AR Follow neurology for further recommendation 2/28 held Plavix due to right shoulder surgery planned on Tuesday, 12/03/2023   #  Right shoulder dislocation and possible fracture?  S/p fall and seizures Hold Plavix for now, continue aspirin Pain meds as needed Currently patient is not stable for surgery Discussed with orthopedic surgery, patient will be kept on the schedule for next  week Tuesday    # Sepsis due to pneumonia  # Acute respiratory failure with hypoxia due to PNA - Sepsis manifested by leukocytosis and tachypnea. - Continue IV Rocephin x 5 days and Zithromax x 3 days - Mucolytic therapy be provided as well as duo nebs q.i.d. and q.4 hours p.r.n. - We will follow blood cultures. S/p IVF, decrease NS 50 mL/h 2/28, fever Tmax 101.  Follow blood cultures, continue to biotics Negative COVID flu and RSV   New onset seizure most likely due to acute CVA Continue Keppra 500 twice daily as per neuro - Continue seizure precautions.  EEG negative - Neurology consulted, recommended MRI brain and C-spine.  MRI positive for acute CVA -Continue prn IV Ativan for now.    Elevated troponin, most likely demand ischemia Trop peaked 174 Patient denies any chest pain TTE LVEF 35 to 40%, no WMA, moderate MR and AR  Rhabdomyolysis most likely due to seizures.  Resolved CK 1700--625--318 S/p IV fluid, continue gentle hydration and encourage oral intake  Hypokalemia, potassium repleted Hypomagnesemia, mag repleted. Monitor electrolytes and replete as needed.    AKI (acute kidney injury) resolved Renal functions improved after IV fluid Monitor renal functions daily Discontinued Lasix and hydrochlorothiazide for now Metabolic acidosis, started oral bicarbonate. Monitor BMP daily    Centrilobular emphysema (HCC) - The patient be placed on scheduled and as needed DuoNebs. - Mucolytic therapy will be provided.   GERD without esophagitis - We will continue PPI therapy.   Anxiety and depression - We will continue Klonopin, Remeron and Cymbalta.   Dyslipidemia - Hold the statin for now due to elevated CK level   IBS (irritable bowel syndrome) - We will continue Amitiza.   Essential hypertension Resumed losartan 100 mg p.o. daily with holding parameters Discontinued Lasix and hydrochlorothiazide Monitor BP and titrate medications  accordingly  Rheumatoid arthritis with rheumatoid factor (HCC) - We will continue methotrexate.   Pulmonary nodules, incidental finding CT scan: Right solid pulmonary nodule within the upper lobe measuring 7 mm. Per Fleischner Society Guidelines, recommend a non-contrast Chest CT at 3-6 months, then another non-contrast Chest CT at 18-24 months. Recommended to follow with PCP and pulmonary as an outpatient  Ascending aortic aneurysm, incidental finding CT scan: 4.2 cm diameter ascending thoracic aortic aneurysm. Recommend annual imaging followup by CTA or MRA. 9. Aortic atherosclerosis. Follow with PCP as an outpatient    Body mass index is 22.15 kg/m.  Interventions:  Diet: Heart healthy diet DVT Prophylaxis: Subcutaneous Lovenox   Advance goals of care discussion: Full code  Family Communication: family was present at bedside, at the time of interview.  The pt provided permission to discuss medical plan with the family. Opportunity was given to ask question and all questions were answered satisfactorily.   Disposition:  Pt is from Home, admitted with Acute CVA, seizures, respiratory failure, pneumonia, which precludes a safe discharge. Discharge to SNF, TBD after PT/OT eval, when stable.  Subjective: Overnight patient had fever Tmax 101.2 and no respiratory distress.  Patient could not sleep and was given Klonopin.  In the morning time patient was sleepy, family was at bedside, management plan discussed.   Physical Exam: General: NAD, sleeping comfortably Appear in no distress, affect appropriate Eyes:  closed, pt was sleeping  ENT: Oral Mucosa Clear, moist  Neck: no JVD,  Cardiovascular: S1 and S2 Present, no Murmur,  Respiratory: Equal air entry bilaterally, mild bibasilar crackles, no wheezes.   Abdomen: Bowel Sound present, Soft and no tenderness,  Skin: no rashes Extremities: no Pedal edema, no calf tenderness Neurologic: CN grossly intact, patient was sleepy  today (RUE power 1-2/5, RLE power 3/5, left side intact) Gait not checked due to patient safety concerns  Vitals:   11/29/23 0343 11/29/23 0444 11/29/23 0551 11/29/23 0830  BP: (!) 152/93 (!) 140/78 (!) 155/91 (!) 147/91  Pulse: (!) 117 (!) 105 (!) 110 (!) 110  Resp: (!) 26 (!) 24 (!) 26 (!) 22  Temp: (!) 101.2 F (38.4 C) 97.8 F (36.6 C) 98.6 F (37 C) 98.5 F (36.9 C)  TempSrc: Axillary Oral Oral   SpO2: 96% 98% 98% 97%  Weight:      Height:        Intake/Output Summary (Last 24 hours) at 11/29/2023 1254 Last data filed at 11/29/2023 0444 Gross per 24 hour  Intake 1365.02 ml  Output 900 ml  Net 465.02 ml   Filed Weights   11/26/23 1716  Weight: 68 kg    Data Reviewed: I have personally reviewed and interpreted daily labs, tele strips, imagings as discussed above. I reviewed all nursing notes, pharmacy notes, vitals, pertinent old records I have discussed plan of care as described above with RN and patient/family.  CBC: Recent Labs  Lab 11/26/23 1709 11/27/23 0620 11/28/23 0559 11/29/23 0456  WBC 21.6* 13.0* 10.4 8.9  NEUTROABS 12.5*  --   --  5.9  HGB 14.5 12.6* 11.2* 11.4*  HCT 44.6 37.7* 34.0* 34.7*  MCV 86.4 84.0 85.2 87.0  PLT 307 236 199 176   Basic Metabolic Panel: Recent Labs  Lab 11/26/23 1709 11/27/23 0620 11/27/23 0916 11/28/23 0559 11/29/23 0409 11/29/23 0456  NA 139 138  --  133*  --  136  K 3.1* 5.0  --  4.3  --  4.1  CL 109 112*  --  105  --  109  CO2 17* 19*  --  19*  --  20*  GLUCOSE 138* 125*  --  122*  --  113*  BUN 12 13  --  12  --  13  CREATININE 1.33* 1.03  --  1.15  --  1.10  CALCIUM 9.0 8.4*  --  8.2*  --  8.3*  MG  --   --  1.6* 2.1 1.9  --   PHOS  --   --  2.7 3.2 3.3  --     Studies: US ARTERIAL ABI (SCREENING LOWER EXTREMITY) Result Date: 11/29/2023 CLINICAL DATA:  Peripheral arterial disease EXAM: NONINVASIVE PHYSIOLOGIC VASCULAR STUDY OF BILATERAL LOWER EXTREMITIES TECHNIQUE: Evaluation of both lower extremities  were performed at rest, including calculation of ankle-brachial indices with single level Doppler, pressure and pulse volume recording. COMPARISON:  None Available. FINDINGS: Right ABI:  1.1 Left ABI:  1.0 Right Lower Extremity:  Normal arterial waveforms at the ankle. Left Lower Extremity:  Normal arterial waveforms at the ankle. 1.0-1.4 Normal IMPRESSION: Normal bilateral resting ankle-brachial indices. Electronically Signed   By: Malachy Moan M.D.   On: 11/29/2023 10:50   DG Chest Port 1 View Result Date: 11/29/2023 CLINICAL DATA:  [id number] Fever [id number] Follow-up exam [id number] EXAM: PORTABLE CHEST 1 VIEW COMPARISON:  Chest radiograph 11/26/2023 FINDINGS: Decreased small bilateral pleural effusions. Cardiomegaly. Unchanged cardiac and  mediastinal contours. No pneumothorax. No new focal airspace opacity. There are prominent bilateral interstitial opacities that could represent mild pulmonary edema, unchanged from prior exam. No new focal airspace opacity. No radiographically apparent displaced rib fractures visualized upper abdomen is notable for mild colonic gaseous distention. IMPRESSION: Mild pulmonary edema with improved small bilateral pleural effusions. Electronically Signed   By: Lorenza Cambridge M.D.   On: 11/29/2023 08:19   CT HEAD WO CONTRAST ( ) Result Date: 11/29/2023 CLINICAL DATA:  Mental status change with unknown cause. EXAM: CT HEAD WITHOUT CONTRAST TECHNIQUE: Contiguous axial images were obtained from the base of the skull through the vertex without intravenous contrast. RADIATION DOSE REDUCTION: This exam was performed according to the departmental dose-optimization program which includes automated exposure control, adjustment of the mA and/or kV according to patient size and/or use of iterative reconstruction technique. COMPARISON:  11/28/2023 FINDINGS: Brain: No evidence of acute infarction, hemorrhage, hydrocephalus, extra-axial collection or mass lesion/mass effect. Cerebral  volume loss, mainly perisylvian. Vascular: No hyperdense vessel or unexpected calcification. Skull: Normal. Negative for fracture or focal lesion. Sinuses/Orbits: Partial bilateral mastoid opacification, chronic and with negative nasopharynx. IMPRESSION: Acute left cerebral infarcts by MRI are occult. No hemorrhage or progressive finding. Electronically Signed   By: Tiburcio Pea M.D.   On: 11/29/2023 06:27   CT ANGIO HEAD NECK W WO CM Result Date: 11/29/2023 CLINICAL DATA:  Acute stroke suspected EXAM: CT ANGIOGRAPHY HEAD AND NECK WITH AND WITHOUT CONTRAST TECHNIQUE: Multidetector CT imaging of the head and neck was performed using the standard protocol during bolus administration of intravenous contrast. Multiplanar CT image reconstructions and MIPs were obtained to evaluate the vascular anatomy. Carotid stenosis measurements (when applicable) are obtained utilizing NASCET criteria, using the distal internal carotid diameter as the denominator. RADIATION DOSE REDUCTION: This exam was performed according to the departmental dose-optimization program which includes automated exposure control, adjustment of the mA and/or kV according to patient size and/or use of iterative reconstruction technique. CONTRAST:  75mL OMNIPAQUE IOHEXOL 350 MG/ML SOLN COMPARISON:  Brain MRI from yesterday.  CTA of the neck 02/03/2021 FINDINGS: CT HEAD FINDINGS Brain: Infarct by prior brain MRI are occult by CT. No evidence of progressive infarct, hydrocephalus, mass, or hemorrhage. Vascular: No hyperdense vessel or unexpected calcification. Skull: Normal. Negative for fracture or focal lesion. Sinuses/Orbits: No acute finding. Review of the MIP images confirms the above findings CTA NECK FINDINGS Aortic arch: Atheromatous plaque without dissection or aneurysm. Two vessel branching. Right carotid system: Atheromatous wall thickening diffusely with mixed density plaque greatest at the bifurcation, proximal ICA stenosis measures nearly  50%. No ulceration or beading although notably tortuous ICA with loop. Left carotid system: Atheromatous wall thickening of the common carotid with fairly bulky mixed density plaque at the proximal ICA causing 60% stenosis measured on coronal reformats. Much of this plaque is low-density with some positive remodeling suspected. No clear ulceration or beading. There is ICA tortuosity in the upper neck with loop. Vertebral arteries: Proximal subclavian atherosclerosis with advanced stenosis on the right measuring 80% on coronal reformats, measurements accentuated by poststenotic dilatation, progressed. Atheromatous plaque at both vertebral ostia with nonvisualized lumen on the left and likely also severe stenosis on the right although visualization is complicated by motion artifact. Symmetrically enhancing vertebral arteries to the dura without beading or dissection. Skeleton: No acute finding Other neck: No acute finding Upper chest: Pulmonary fibrosis and partially covered right pleural fluid, reference recent chest CT. Review of the MIP images confirms the above findings  CTA HEAD FINDINGS Anterior circulation: Atheromatous calcification of the cavernous carotids with mild to moderate right supraclinoid ICA stenosis. Extensive atheromatous irregularity of branch vessels. No branch occlusion, beading, or aneurysm. Posterior circulation: The right vertebral artery is dominant. Extensive atheromatous irregularity. No branch occlusion, beading, or aneurysm. Fetal type right PCA flow. Venous sinuses: Negative Anatomic variants: As above Review of the MIP images confirms the above findings IMPRESSION: 1. No emergent finding. 2. 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 3. Extensive intracranial atheromatous irregularity without correctable, proximal flow reducing stenosis. Electronically Signed   By: Tiburcio Pea M.D.   On:  11/29/2023 05:34   CT HUMERUS RIGHT WO CONTRAST Result Date: 11/28/2023 CLINICAL DATA:  Upper arm pain, history of recent fall EXAM: CT OF THE RIGHT HUMERUS WITHOUT CONTRAST TECHNIQUE: Multidetector CT imaging was performed according to the standard protocol. Multiplanar CT image reconstructions were also generated. RADIATION DOSE REDUCTION: This exam was performed according to the departmental dose-optimization program which includes automated exposure control, adjustment of the mA and/or kV according to patient size and/or use of iterative reconstruction technique. COMPARISON:  Plain film from 11/26/2023 and CT from 07/18/2023. FINDINGS: Bones/Joint/Cartilage There is a new impaction fracture identified in the medial aspect of the humeral head which conforms to the shape of the posterior aspect of the glenoid. This is likely related to the recent injury as this was not seen on a prior CT from 07/18/2023. The remainder of the humerus is within normal limits. Visualized proximal radius and ulna are unremarkable. The glenoid and remainder of the scapula appear within normal limits. Ligaments Suboptimally assessed by CT. Muscles and Tendons Surrounding musculature appears within normal limits. Soft tissues No focal hematoma or other soft tissue abnormality is noted. IMPRESSION: Impaction fracture in the medial aspect of the humeral head which conforms to the shape of the adjacent glenoid likely related to the recent injury as this was not seen on prior exams. Electronically Signed   By: Alcide Clever M.D.   On: 11/28/2023 19:14    Scheduled Meds:  [START ON 11/30/2023] aspirin EC  81 mg Oral Daily   [START ON 11/30/2023] clopidogrel  75 mg Oral Daily   cyanocobalamin  1,000 mcg Oral Daily   DULoxetine  30 mg Oral QHS   enoxaparin (LOVENOX) injection  40 mg Subcutaneous Q24H   folic acid  1 mg Oral Daily   guaiFENesin  600 mg Oral BID   hydroxychloroquine  200 mg Oral 2 times per day on Monday Tuesday Wednesday  Thursday Friday   [START ON 11/30/2023] hydroxychloroquine  200 mg Oral Once per day on Sunday Saturday   levETIRAcetam  500 mg Oral BID   lubiprostone  24 mcg Oral BID   metoCLOPramide  10 mg Oral TID AC   mirtazapine  15 mg Oral QHS   pantoprazole  40 mg Oral BID   potassium chloride  10 mEq Oral Q M,W,F   sodium bicarbonate  650 mg Oral TID   tamsulosin  0.4 mg Oral Daily   traZODone  50 mg Oral QHS   Vitamin D (Ergocalciferol)  50,000 Units Oral Q7 days   Continuous Infusions:  cefTRIAXone (ROCEPHIN)  IV Stopped (11/29/23 0021)   PRN Meds: acetaminophen **OR** acetaminophen, albuterol, chlorpheniramine-HYDROcodone, LORazepam, magnesium hydroxide, morphine injection, ondansetron **OR** ondansetron (ZOFRAN) IV, oxyCODONE  Time spent: 55 minutes  Author: Gillis Santa. MD [facility] 11/29/2023 12:54 PM  To reach On-call, see  care teams to locate the attending and reach out to them via [email or web address]. If 7PM-7AM, please contact night-coverage If you still have difficulty reaching the attending provider, please page the [facility] (Director on Call) for [facility] on amion for assistance.

## 2023-11-29 NOTE — Progress Notes (Addendum)
       CROSS COVER NOTE  NAME: COBIE MARCOUX MRN: [id number] DOB : 12/15/1960 ATTENDING PHYSICIAN: Gillis Santa, MD    Date of Service   11/29/2023   HPI/Events of Note   Rapid called secondary to patietn with yellow mews now red Paitent with fever and tachycardia Patient admitted with PNA. L MCA territory punctate infarcts, new onset seizures and rhabdomyolysis  Interventions   Assessment/Plan:    11/29/2023    4:44 AM 11/29/2023    3:43 AM 11/29/2023    1:39 AM  Vitals with BMI  Systolic 140 152 130  Diastolic 78 93 83  Pulse 105 117 121   Temp      101.1 axillary Patient lethargic - non verbal, face symmetrical no commands followed Pulm - sonorous respirations in setting of post 1 mg klonopin - sats stable on 2 L [place] VBG good  Chest xray - no official read yet . Viewed bedside, no areas definitive for pneumonia noted  Repeat Head CT - no acute findings -  Chem panel without concerns for acidosis - albuminemia not new CBC without leukocytosis Repeat resp panel - fever likely viral or atelectasis  Discontinue klonopin        Donnie Mesa NP [facility] Cross Cover 7pm-7am - check amion for availability Pager [phone]

## 2023-11-29 NOTE — Progress Notes (Signed)
 Patient received from procedure via bed in stable condition.

## 2023-11-30 DIAGNOSIS — J189 Pneumonia, unspecified organism: Secondary | ICD-10-CM | POA: Diagnosis not present

## 2023-11-30 DIAGNOSIS — A419 Sepsis, unspecified organism: Secondary | ICD-10-CM | POA: Diagnosis not present

## 2023-11-30 LAB — CBC
HCT: 35.7 % — ABNORMAL LOW (ref 39.0–52.0)
Hemoglobin: 11.7 g/dL — ABNORMAL LOW (ref 13.0–17.0)
MCH: 28.1 pg (ref 26.0–34.0)
MCHC: 32.8 g/dL (ref 30.0–36.0)
MCV: 85.8 fL (ref 80.0–100.0)
Platelets: 226 10*3/uL (ref 150–400)
RBC: 4.16 MIL/uL — ABNORMAL LOW (ref 4.22–5.81)
RDW: 15.3 % (ref 11.5–15.5)
WBC: 9.2 10*3/uL (ref 4.0–10.5)
nRBC: 0 % (ref 0.0–0.2)

## 2023-11-30 LAB — BASIC METABOLIC PANEL
Anion gap: 10 (ref 5–15)
BUN: 14 mg/dL (ref 8–23)
CO2: 19 mmol/L — ABNORMAL LOW (ref 22–32)
Calcium: 8.6 mg/dL — ABNORMAL LOW (ref 8.9–10.3)
Chloride: 111 mmol/L (ref 98–111)
Creatinine, Ser: 0.91 mg/dL (ref 0.61–1.24)
GFR, Estimated: >60 mL/min (ref >60)
Glucose, Bld: 82 mg/dL (ref 70–99)
Potassium: 3.9 mmol/L (ref 3.5–5.1)
Sodium: 140 mmol/L (ref 135–145)

## 2023-11-30 LAB — MAGNESIUM: Magnesium: 1.9 mg/dL (ref 1.7–2.4)

## 2023-11-30 LAB — PHOSPHORUS: Phosphorus: 3.3 mg/dL (ref 2.5–4.6)

## 2023-11-30 MED ORDER — METOPROLOL TARTRATE 25 MG PO TABS
25.0000 mg | ORAL_TABLET | Freq: Two times a day (BID) | ORAL | Status: DC
  Administered 2023-11-30 – 2023-12-04 (×9): 25 mg via ORAL
  Filled 2023-11-30 (×9): qty 1

## 2023-11-30 NOTE — Plan of Care (Signed)

## 2023-11-30 NOTE — Plan of Care (Signed)
  Problem: Fluid Volume: Goal: Hemodynamic stability will improve 11/30/2023 1724 by Leandro Reasoner, RN Outcome: Progressing 11/30/2023 1522 by Leandro Reasoner, RN Outcome: Progressing   Problem: Clinical Measurements: Goal: Diagnostic test results will improve 11/30/2023 1724 by Leandro Reasoner, RN Outcome: Progressing 11/30/2023 1522 by Leandro Reasoner, RN Outcome: Progressing Goal: Signs and symptoms of infection will decrease 11/30/2023 1724 by Leandro Reasoner, RN Outcome: Progressing 11/30/2023 1522 by Leandro Reasoner, RN Outcome: Progressing   Problem: Respiratory: Goal: Ability to maintain adequate ventilation will improve 11/30/2023 1724 by Leandro Reasoner, RN Outcome: Progressing 11/30/2023 1522 by Leandro Reasoner, RN Outcome: Progressing   Problem: Education: Goal: Knowledge of General Education information will improve Description: Including pain rating scale, medication(s)/side effects and non-pharmacologic comfort measures 11/30/2023 1724 by Leandro Reasoner, RN Outcome: Progressing 11/30/2023 1522 by Leandro Reasoner, RN Outcome: Progressing   Problem: Health Behavior/Discharge Planning: Goal: Ability to manage health-related needs will improve 11/30/2023 1724 by Leandro Reasoner, RN Outcome: Progressing 11/30/2023 1522 by Leandro Reasoner, RN Outcome: Progressing

## 2023-11-30 NOTE — Progress Notes (Signed)
 [facility] Progress Note  Patient: Jose Cuevas    IEP:[id number]  DOA: 11/26/2023     Date of Service: the patient was seen and examined on 11/30/2023  Chief Complaint  Patient presents with   unresponsive   Brief hospital course: Jose Cuevas is a 64 y.o. Caucasian male with medical history significant for hypertension, dyslipidemia, gastroparesis, rheumatoid arthritis, OSA and DDD, who presented to the emergency room with acute onset of tonic-clonic seizure witnessed by his family with postictal confusion and lack of memory about the event.  The patient later had nausea and vomiting.  Did not any fever or chills.  No chest pain or palpitations.  He had right shoulder pain when he fell during his seizure.  The patient denies any significant cough or wheezing or dyspnea.  No paresthesias or focal muscle weakness.  He had no urinary or stool incontinence.  No tongue bites.  No dysuria, oliguria or hematuria or flank pain.   ED Course: When the patient came to the ER, BP was 151/84 with pulse oximetry of 98% on 4 L of O2 by nasal cannula and later respiratory rate was 30 and otherwise normal vital signs.  Labs revealed hypokalemia 3.1 and creatinine 1.33 above previous levels.  High-sensitivity troponin I was 54 and later 125.  Lactic acid was 4.4 and later 2.5.  CBC showed leukocytosis 21.6 with neutrophilia. EKG as reviewed by me : EKG showed sinus rhythm with rate of 92 and left bundle branch block. Imaging: Right shoulder x-ray showed osteoarthritis with no acute displaced fracture.  It showed emphysema with stable right pleural thickening versus small pleural effusion.  Portable chest x-ray showed no acute cardiopulmonary disease. Chest CTA revealed the following: 1. No evidence of significant pulmonary embolus. 2. Cardiac enlargement with left ventricular hypertrophy. 3. Probable loculated collection in the right pleural space, possibly related to infection. Infiltrates in the left 4. Small  left pleural effusion with basilar atelectasis. 5. Diffuse emphysematous changes throughout the lungs with subpleural fibrosis. 6. Multiple pulmonary nodules. Most significant: Right solid pulmonary nodule within the upper lobe measuring 7 mm. Per Fleischner Society Guidelines, recommend a non-contrast Chest CT at 3-6 months, then another non-contrast Chest CT at 18-24 months. 7. Hilar and mediastinal lymphadenopathy is nonspecific and could represent reactive change versus lymphoproliferative process. Similar appearance to previous study. 8. 4.2 cm diameter ascending thoracic aortic aneurysm. Recommend annual imaging followup by CTA or MRA. 9. Aortic atherosclerosis.   Noncontrasted head CT scan revealed no acute intracranial normalities.  It showed bilateral mastoid effusions.  The patient was given IV Rocephin and Zithromax, p.o. ibuprofen, 4 mg of IV Zofran, 40 mill Cabbell of p.o. potassium chloride and 1 L bolus of IV normal saline.  He will be admitted to a medical telemetry bed for further evaluation and management.   Assessment and Plan:  # Acute left MCA infarct MRI: Positive for several punctate acute infarcts scattered in the Left MCA territory. Associated hemorrhage or mass effect. DAPT x 90 days f/b ASA 81 mg pod LDL 47, below goal 70, TSH 1.8 within normal range Held statin due to elevated CK level, but he does not need a statin as per neurology because LDL is below goal. Continue telemetry Continue NIH stroke scale as per protocol Follow PT OT and SLP TTE LVEF 35 to 40%, no WMA, moderate MR and AR Follow neurology for further recommendation 2/28 held Plavix due to right shoulder surgery planned on Tuesday, 12/03/2023   #  Right shoulder dislocation and possible fracture?  S/p fall and seizures Hold Plavix for now, continue aspirin Pain meds as needed Currently patient is not stable for surgery Discussed with orthopedic surgery, patient will be kept on the schedule for next  week Tuesday Patient needs reverse shoulder arthroplasty, which will be done on Tuesday   # Sepsis due to pneumonia  # Acute respiratory failure with hypoxia due to PNA - Sepsis manifested by leukocytosis and tachypnea. - Continue IV Rocephin x 5 days and Zithromax x 3 days - Mucolytic therapy be provided as well as duo nebs q.i.d. and q.4 hours p.r.n. - We will follow blood cultures. S/p IVF, decrease NS 50 mL/h 2/28, fever Tmax 101.  Follow blood cultures, continue to biotics Negative COVID flu and RSV Gradually wean off oxygen   New onset seizure most likely due to acute CVA Continue Keppra 500 twice daily as per neuro - Continue seizure precautions.  EEG negative - Neurology consulted, recommended MRI brain and C-spine.  MRI positive for acute CVA -Continue prn IV Ativan for now.    Elevated troponin, most likely demand ischemia Trop peaked 174 Patient denies any chest pain TTE LVEF 35 to 40%, no WMA, moderate MR and AR  Rhabdomyolysis most likely due to seizures.  Resolved CK 1700--625--318 S/p IV fluid, continue gentle hydration and encourage oral intake  Hypokalemia, potassium repleted Hypomagnesemia, mag repleted. Monitor electrolytes and replete as needed.    AKI (acute kidney injury) resolved Renal functions improved after IV fluid Monitor renal functions daily Discontinued Lasix and hydrochlorothiazide for now Metabolic acidosis, started oral bicarbonate. Monitor BMP daily    Centrilobular emphysema (HCC) - The patient be placed on scheduled and as needed DuoNebs. - Mucolytic therapy will be provided.   GERD without esophagitis - We will continue PPI therapy.   Anxiety and depression - We will continue Klonopin, Remeron and Cymbalta.   Dyslipidemia - Hold the statin for now due to elevated CK level   IBS (irritable bowel syndrome) - We will continue Amitiza.   Essential hypertension Resumed losartan 100 mg p.o. daily with holding  parameters Discontinued Lasix and hydrochlorothiazide Monitor BP and titrate medications accordingly  Rheumatoid arthritis with rheumatoid factor (HCC) - We will continue methotrexate.   Pulmonary nodules, incidental finding CT scan: Right solid pulmonary nodule within the upper lobe measuring 7 mm. Per Fleischner Society Guidelines, recommend a non-contrast Chest CT at 3-6 months, then another non-contrast Chest CT at 18-24 months. Recommended to follow with PCP and pulmonary as an outpatient  Ascending aortic aneurysm, incidental finding CT scan: 4.2 cm diameter ascending thoracic aortic aneurysm. Recommend annual imaging followup by CTA or MRA. 9. Aortic atherosclerosis. Follow with PCP as an outpatient    Body mass index is 22.15 kg/m.  Interventions:  Diet: Heart healthy diet DVT Prophylaxis: Subcutaneous Lovenox   Advance goals of care discussion: Full code  Family Communication: family was present at bedside, at the time of interview.  The pt provided permission to discuss medical plan with the family. Opportunity was given to ask question and all questions were answered satisfactorily.   Disposition:  Pt is from Home, admitted with Acute CVA, seizures, respiratory failure, pneumonia, which precludes a safe discharge. Discharge to SNF, TBD after PT/OT eval, when stable.  Subjective: No significant overnight event, as per RN patient had an episode of apnea no any other problems.  Patient was awake and alert today, right arm pain 4/10 at rest, stated that it hurts more  on the movement.  Breathing is getting better, denied any chest pain or palpitation, no any other complaints.   Physical Exam: General: NAD, looked tired Appear in no distress, affect depressed Eyes: PERRLA   ENT: Oral Mucosa Clear, moist  Neck: no JVD,  Cardiovascular: S1 and S2 Present, no Murmur,  Respiratory: Equal air entry bilaterally, mild bibasilar crackles, no wheezes.   Abdomen: Bowel Sound  present, Soft and no tenderness,  Skin: no rashes Extremities: no Pedal edema, no calf tenderness Neurologic: CN grossly intact, RUE power 1-2/5, RLE power 3/5, left side intact Gait not checked due to patient safety concerns  Vitals:   11/29/23 2103 11/30/23 0056 11/30/23 0827 11/30/23 1202  BP: (!) 146/97 130/84 (!) 151/91 137/81  Pulse: (!) 122 (!) 109 (!) 115 84  Resp: (!) 22 (!) 22 20 18   Temp: 98.9 F (37.2 C) 99.2 F (37.3 C) 97.7 F (36.5 C) 98 F (36.7 C)  TempSrc:      SpO2: 97% 96% 96% 100%  Weight:      Height:        Intake/Output Summary (Last 24 hours) at 11/30/2023 1237 Last data filed at 11/30/2023 0600 Gross per 24 hour  Intake 798.33 ml  Output 750 ml  Net 48.33 ml   Filed Weights   11/26/23 1716  Weight: 68 kg    Data Reviewed: I have personally reviewed and interpreted daily labs, tele strips, imagings as discussed above. I reviewed all nursing notes, pharmacy notes, vitals, pertinent old records I have discussed plan of care as described above with RN and patient/family.  CBC: Recent Labs  Lab 11/26/23 1709 11/27/23 0620 11/28/23 0559 11/29/23 0456 11/30/23 0518  WBC 21.6* 13.0* 10.4 8.9 9.2  NEUTROABS 12.5*  --   --  5.9  --   HGB 14.5 12.6* 11.2* 11.4* 11.7*  HCT 44.6 37.7* 34.0* 34.7* 35.7*  MCV 86.4 84.0 85.2 87.0 85.8  PLT 307 236 199 176 226   Basic Metabolic Panel: Recent Labs  Lab 11/26/23 1709 11/27/23 0620 11/27/23 0916 11/28/23 0559 11/29/23 0409 11/29/23 0456 11/30/23 0518  NA 139 138  --  133*  --  136 140  K 3.1* 5.0  --  4.3  --  4.1 3.9  CL 109 112*  --  105  --  109 111  CO2 17* 19*  --  19*  --  20* 19*  GLUCOSE 138* 125*  --  122*  --  113* 82  BUN 12 13  --  12  --  13 14  CREATININE 1.33* 1.03  --  1.15  --  1.10 0.91  CALCIUM 9.0 8.4*  --  8.2*  --  8.3* 8.6*  MG  --   --  1.6* 2.1 1.9  --  1.9  PHOS  --   --  2.7 3.2 3.3  --  3.3    Studies: No results found.   Scheduled Meds:  aspirin EC  81 mg  Oral Daily   [START ON 12/04/2023] clopidogrel  75 mg Oral Daily   cyanocobalamin  1,000 mcg Oral Daily   DULoxetine  30 mg Oral QHS   enoxaparin (LOVENOX) injection  40 mg Subcutaneous Q24H   folic acid  1 mg Oral Daily   guaiFENesin  600 mg Oral BID   hydroxychloroquine  200 mg Oral 2 times per day on Monday Tuesday Wednesday Thursday Friday   hydroxychloroquine  200 mg Oral Once per day on Sunday Saturday  levETIRAcetam  500 mg Oral BID   lubiprostone  24 mcg Oral BID   metoCLOPramide  10 mg Oral TID AC   metoprolol tartrate  25 mg Oral BID   mirtazapine  15 mg Oral QHS   pantoprazole  40 mg Oral BID   potassium chloride  10 mEq Oral Q M,W,F   tamsulosin  0.4 mg Oral Daily   traZODone  50 mg Oral QHS   Vitamin D (Ergocalciferol)  50,000 Units Oral Q7 days   Continuous Infusions:  sodium chloride 50 mL/hr at 11/29/23 1402   cefTRIAXone (ROCEPHIN)  IV 2 g (11/29/23 2210)   PRN Meds: acetaminophen **OR** acetaminophen, albuterol, chlorpheniramine-HYDROcodone, LORazepam, magnesium hydroxide, morphine injection, ondansetron **OR** ondansetron (ZOFRAN) IV, oxyCODONE, polyvinyl alcohol  Time spent: 40 minutes  Author: Gillis Santa. MD [facility] 11/30/2023 12:37 PM  To reach On-call, see care teams to locate the attending and reach out to them via [email or web address]. If 7PM-7AM, please contact night-coverage If you still have difficulty reaching the attending provider, please page the [facility] (Director on Call) for [facility] on amion for assistance.

## 2023-12-01 ENCOUNTER — Other Ambulatory Visit: Payer: Self-pay

## 2023-12-01 ENCOUNTER — Encounter: Payer: Self-pay | Admitting: Family Medicine

## 2023-12-01 DIAGNOSIS — A419 Sepsis, unspecified organism: Secondary | ICD-10-CM | POA: Diagnosis not present

## 2023-12-01 DIAGNOSIS — J189 Pneumonia, unspecified organism: Secondary | ICD-10-CM | POA: Diagnosis not present

## 2023-12-01 LAB — BASIC METABOLIC PANEL
Anion gap: 9 (ref 5–15)
BUN: 17 mg/dL (ref 8–23)
CO2: 20 mmol/L — ABNORMAL LOW (ref 22–32)
Calcium: 8.6 mg/dL — ABNORMAL LOW (ref 8.9–10.3)
Chloride: 109 mmol/L (ref 98–111)
Creatinine, Ser: 0.95 mg/dL (ref 0.61–1.24)
GFR, Estimated: >60 mL/min (ref >60)
Glucose, Bld: 123 mg/dL — ABNORMAL HIGH (ref 70–99)
Potassium: 3.4 mmol/L — ABNORMAL LOW (ref 3.5–5.1)
Sodium: 138 mmol/L (ref 135–145)

## 2023-12-01 LAB — CBC
HCT: 34.8 % — ABNORMAL LOW (ref 39.0–52.0)
Hemoglobin: 11.5 g/dL — ABNORMAL LOW (ref 13.0–17.0)
MCH: 28.8 pg (ref 26.0–34.0)
MCHC: 33 g/dL (ref 30.0–36.0)
MCV: 87 fL (ref 80.0–100.0)
Platelets: 256 10*3/uL (ref 150–400)
RBC: 4 MIL/uL — ABNORMAL LOW (ref 4.22–5.81)
RDW: 15.1 % (ref 11.5–15.5)
WBC: 10.9 10*3/uL — ABNORMAL HIGH (ref 4.0–10.5)
nRBC: 0 % (ref 0.0–0.2)

## 2023-12-01 LAB — PHOSPHORUS: Phosphorus: 2.9 mg/dL (ref 2.5–4.6)

## 2023-12-01 LAB — MAGNESIUM: Magnesium: 1.8 mg/dL (ref 1.7–2.4)

## 2023-12-01 MED ORDER — MAGNESIUM OXIDE -MG SUPPLEMENT 400 (240 MG) MG PO TABS
400.0000 mg | ORAL_TABLET | Freq: Two times a day (BID) | ORAL | Status: DC
  Administered 2023-12-01 – 2023-12-05 (×8): 400 mg via ORAL
  Filled 2023-12-01 (×8): qty 1

## 2023-12-01 MED ORDER — POTASSIUM CHLORIDE CRYS ER 20 MEQ PO TBCR
40.0000 meq | EXTENDED_RELEASE_TABLET | Freq: Once | ORAL | Status: AC
  Administered 2023-12-01: 40 meq via ORAL
  Filled 2023-12-01: qty 2

## 2023-12-01 NOTE — Progress Notes (Signed)
 Inpatient Rehab Admissions Coordinator:   Per therapy recommendations, patient was screened for CIR candidacy by Megan Salon, MS, CCC-SLP. At this time, Pt. is not at a level to tolerate the intensity of CIR  Pt. may have potential to progress to becoming a potential CIR candidate, so CIR admissions team will follow and monitor for progress and participation with therapies and place consult order if Pt. appears to be an appropriate candidate.  If Pt is ready for d/c, recommend TOC look at other rehab venues. Please contact me with any questions.   Megan Salon, MS, CCC-SLP Rehab Admissions Coordinator  [phone] (celll) [phone] (office)

## 2023-12-01 NOTE — Progress Notes (Signed)
 [facility] Progress Note  Patient: Jose Cuevas    OZH:[id number]  DOA: 11/26/2023     Date of Service: the patient was seen and examined on 12/01/2023  Chief Complaint  Patient presents with   unresponsive   Brief hospital course: Jose Cuevas is a 63 y.o. Caucasian male with medical history significant for hypertension, dyslipidemia, gastroparesis, rheumatoid arthritis, OSA and DDD, who presented to the emergency room with acute onset of tonic-clonic seizure witnessed by his family with postictal confusion and lack of memory about the event.  The patient later had nausea and vomiting.  Did not any fever or chills.  No chest pain or palpitations.  He had right shoulder pain when he fell during his seizure.  The patient denies any significant cough or wheezing or dyspnea.  No paresthesias or focal muscle weakness.  He had no urinary or stool incontinence.  No tongue bites.  No dysuria, oliguria or hematuria or flank pain.   ED Course: When the patient came to the ER, BP was 151/84 with pulse oximetry of 98% on 4 L of O2 by nasal cannula and later respiratory rate was 30 and otherwise normal vital signs.  Labs revealed hypokalemia 3.1 and creatinine 1.33 above previous levels.  High-sensitivity troponin I was 54 and later 125.  Lactic acid was 4.4 and later 2.5.  CBC showed leukocytosis 21.6 with neutrophilia. EKG as reviewed by me : EKG showed sinus rhythm with rate of 92 and left bundle branch block. Imaging: Right shoulder x-ray showed osteoarthritis with no acute displaced fracture.  It showed emphysema with stable right pleural thickening versus small pleural effusion.  Portable chest x-ray showed no acute cardiopulmonary disease. Chest CTA revealed the following: 1. No evidence of significant pulmonary embolus. 2. Cardiac enlargement with left ventricular hypertrophy. 3. Probable loculated collection in the right pleural space, possibly related to infection. Infiltrates in the left 4. Small  left pleural effusion with basilar atelectasis. 5. Diffuse emphysematous changes throughout the lungs with subpleural fibrosis. 6. Multiple pulmonary nodules. Most significant: Right solid pulmonary nodule within the upper lobe measuring 7 mm. Per Fleischner Society Guidelines, recommend a non-contrast Chest CT at 3-6 months, then another non-contrast Chest CT at 18-24 months. 7. Hilar and mediastinal lymphadenopathy is nonspecific and could represent reactive change versus lymphoproliferative process. Similar appearance to previous study. 8. 4.2 cm diameter ascending thoracic aortic aneurysm. Recommend annual imaging followup by CTA or MRA. 9. Aortic atherosclerosis.   Noncontrasted head CT scan revealed no acute intracranial normalities.  It showed bilateral mastoid effusions.  The patient was given IV Rocephin and Zithromax, p.o. ibuprofen, 4 mg of IV Zofran, 40 mill Cabbell of p.o. potassium chloride and 1 L bolus of IV normal saline.  He will be admitted to a medical telemetry bed for further evaluation and management.   Assessment and Plan:  # Acute left MCA infarct MRI: Positive for several punctate acute infarcts scattered in the Left MCA territory. Associated hemorrhage or mass effect. DAPT x 90 days f/b ASA 81 mg pod LDL 47, below goal 70, TSH 1.8 within normal range Held statin due to elevated CK level, but he does not need a statin as per neurology because LDL is below goal. Continue telemetry Continue NIH stroke scale as per protocol Follow PT OT and SLP TTE LVEF 35 to 40%, no WMA, moderate MR and AR Follow neurology for further recommendation 2/28 held Plavix due to right shoulder surgery planned on Tuesday, 12/03/2023   #  Right shoulder dislocation and possible fracture?  S/p fall and seizures Hold Plavix for now, continue aspirin Pain meds as needed Currently patient is not stable for surgery Discussed with orthopedic surgery, patient will be kept on the schedule for next  week Tuesday Patient needs reverse shoulder arthroplasty, which will be done on Tuesday   # Sepsis due to pneumonia  # Acute respiratory failure with hypoxia due to PNA, Resolved Sepsis manifested by leukocytosis and tachypnea. S/p IV Rocephin x 5 days and Zithromax x 3 days, completed course Continue mucolytic therapy  and DuoNeb p.r.n. Blood cultures NGTD S/p IVF, and s/p NS 50 mL/h 2/28, fever Tmax 101. blood cultures NGTD, s/p Antibiotics Negative COVID flu and RSV Supplemental O2 inhalation weaned off, currently saturating well on room air   New onset seizure most likely due to acute CVA Continue Keppra 500 twice daily as per neuro - Continue seizure precautions.  EEG negative - Neurology consulted, recommended MRI brain and C-spine.  MRI positive for acute CVA -Continue prn IV Ativan for now.    Elevated troponin, most likely demand ischemia Trop peaked 174 Patient denies any chest pain TTE LVEF 35 to 40%, no WMA, moderate MR and AR  Rhabdomyolysis most likely due to seizures.  Resolved CK 1700--625--318 S/p IV fluid, continue gentle hydration and encourage oral intake  Hypokalemia, potassium repleted Hypomagnesemia, mag repleted. Monitor electrolytes and replete as needed.    AKI (acute kidney injury) resolved Renal functions improved after IV fluid Monitor renal functions daily Discontinued Lasix and hydrochlorothiazide for now Metabolic acidosis, started oral bicarbonate. Monitor BMP daily    Centrilobular emphysema (HCC) - The patient be placed on scheduled and as needed DuoNebs. - Mucolytic therapy will be provided.   GERD without esophagitis - We will continue PPI therapy.   Anxiety and depression - We will continue Klonopin, Remeron and Cymbalta.   Dyslipidemia - Hold the statin for now due to elevated CK level   IBS (irritable bowel syndrome) - We will continue Amitiza.   Essential hypertension Resumed losartan 100 mg p.o. daily with holding  parameters Discontinued Lasix and hydrochlorothiazide Monitor BP and titrate medications accordingly  Rheumatoid arthritis with rheumatoid factor (HCC) - We will continue methotrexate.   Pulmonary nodules, incidental finding CT scan: Right solid pulmonary nodule within the upper lobe measuring 7 mm. Per Fleischner Society Guidelines, recommend a non-contrast Chest CT at 3-6 months, then another non-contrast Chest CT at 18-24 months. Recommended to follow with PCP and pulmonary as an outpatient  Ascending aortic aneurysm, incidental finding CT scan: 4.2 cm diameter ascending thoracic aortic aneurysm. Recommend annual imaging followup by CTA or MRA. 9. Aortic atherosclerosis. Follow with PCP as an outpatient  Vitamin D insufficiency, started vitamin D 50,000 units p.o. weekly.  Body mass index is 22.15 kg/m.  Interventions:  Diet: Heart healthy diet DVT Prophylaxis: Subcutaneous Lovenox   Advance goals of care discussion: Full code  Family Communication: family was present at bedside, at the time of interview.  The pt provided permission to discuss medical plan with the family. Opportunity was given to ask question and all questions were answered satisfactorily.   Disposition:  Pt is from Home, admitted with Acute CVA, seizures, respiratory failure, pneumonia, right shoulder dislocation, which precludes a safe discharge. Scheduled for right shoulder surgery on 2/4 Discharge to SNF, TBD after PT/OT eval, when stable.  Subjective: No significant overnight event, patient awake and alert today, stated no pain at rest but it hurts when he moves  the arm.  Denied any complaints, no chest pain or palpitation, shortness of breath is getting better.  Physical Exam: General: NAD, looked tired Appear in no distress, affect depressed Eyes: PERRLA   ENT: Oral Mucosa Clear, moist  Neck: no JVD,  Cardiovascular: S1 and S2 Present, no Murmur,  Respiratory: Equal air entry bilaterally, mild  crackles, no wheezes.   Abdomen: Bowel Sound present, Soft and no tenderness,  Skin: no rashes Extremities: no Pedal edema, no calf tenderness Neurologic: CN grossly intact, RUE power 1-2/5, RLE power 3/5, left side intact Gait not checked due to patient safety concerns  Vitals:   12/01/23 0002 12/01/23 0647 12/01/23 0842 12/01/23 1243  BP: 136/68 130/83 (!) 137/92 121/72  Pulse: 96 94 100 73  Resp: 20  18 20   Temp: 98.7 F (37.1 C) 98.1 F (36.7 C) 98.2 F (36.8 C) 98.1 F (36.7 C)  TempSrc: Oral     SpO2: 95% 94% 94% 97%  Weight:      Height:        Intake/Output Summary (Last 24 hours) at 12/01/2023 1310 Last data filed at 11/30/2023 2051 Gross per 24 hour  Intake --  Output 500 ml  Net -500 ml   Filed Weights   11/26/23 1716  Weight: 68 kg    Data Reviewed: I have personally reviewed and interpreted daily labs, tele strips, imagings as discussed above. I reviewed all nursing notes, pharmacy notes, vitals, pertinent old records I have discussed plan of care as described above with RN and patient/family.  CBC: Recent Labs  Lab 11/26/23 1709 11/27/23 0620 11/28/23 0559 11/29/23 0456 11/30/23 0518 12/01/23 0216  WBC 21.6* 13.0* 10.4 8.9 9.2 10.9*  NEUTROABS 12.5*  --   --  5.9  --   --   HGB 14.5 12.6* 11.2* 11.4* 11.7* 11.5*  HCT 44.6 37.7* 34.0* 34.7* 35.7* 34.8*  MCV 86.4 84.0 85.2 87.0 85.8 87.0  PLT 307 236 199 176 226 256   Basic Metabolic Panel: Recent Labs  Lab 11/27/23 0620 11/27/23 0916 11/28/23 0559 11/29/23 0409 11/29/23 0456 11/30/23 0518 12/01/23 0216  NA 138  --  133*  --  136 140 138  K 5.0  --  4.3  --  4.1 3.9 3.4*  CL 112*  --  105  --  109 111 109  CO2 19*  --  19*  --  20* 19* 20*  GLUCOSE 125*  --  122*  --  113* 82 123*  BUN 13  --  12  --  13 14 17   CREATININE 1.03  --  1.15  --  1.10 0.91 0.95  CALCIUM 8.4*  --  8.2*  --  8.3* 8.6* 8.6*  MG  --  1.6* 2.1 1.9  --  1.9 1.8  PHOS  --  2.7 3.2 3.3  --  3.3 2.9     Studies: No results found.   Scheduled Meds:  aspirin EC  81 mg Oral Daily   [START ON 12/04/2023] clopidogrel  75 mg Oral Daily   cyanocobalamin  1,000 mcg Oral Daily   DULoxetine  30 mg Oral QHS   enoxaparin (LOVENOX) injection  40 mg Subcutaneous Q24H   folic acid  1 mg Oral Daily   guaiFENesin  600 mg Oral BID   hydroxychloroquine  200 mg Oral 2 times per day on Monday Tuesday Wednesday Thursday Friday   hydroxychloroquine  200 mg Oral Once per day on Sunday Saturday   levETIRAcetam  500  mg Oral BID   lubiprostone  24 mcg Oral BID   magnesium oxide  400 mg Oral BID   metoCLOPramide  10 mg Oral TID AC   metoprolol tartrate  25 mg Oral BID   mirtazapine  15 mg Oral QHS   pantoprazole  40 mg Oral BID   tamsulosin  0.4 mg Oral Daily   traZODone  50 mg Oral QHS   Vitamin D (Ergocalciferol)  50,000 Units Oral Q7 days   Continuous Infusions:   PRN Meds: acetaminophen **OR** acetaminophen, albuterol, chlorpheniramine-HYDROcodone, LORazepam, magnesium hydroxide, morphine injection, ondansetron **OR** ondansetron (ZOFRAN) IV, oxyCODONE, polyvinyl alcohol  Time spent: 35 minutes  Author: Gillis Santa. MD [facility] 12/01/2023 1:10 PM  To reach On-call, see care teams to locate the attending and reach out to them via [email or web address]. If 7PM-7AM, please contact night-coverage If you still have difficulty reaching the attending provider, please page the [facility] (Director on Call) for [facility] on amion for assistance.

## 2023-12-01 NOTE — Progress Notes (Signed)
 Physical Therapy Treatment Patient Details Name: Jose Cuevas MRN: [id number] DOB: [birth year] Today's Date: 12/01/2023   History of Present Illness 63 yo man who presented to hospital after first lifetime GTC in the setting of CAP, MRI showing Positive for several punctate acute infarcts scattered in the  Left MCA territory. Associated hemorrhage or mass effect., No other acute intracranial abnormality. Solitary chronic  microhemorrhage and mild for age underlying white matter signal  changes, most commonly due to chronic small vessel disease, Bilateral mastoid effusions, likely postinflammatory. No  complicating features are identified.      PMH significant for HL, HTN, gastroparesis, RA, OSA, Centrilobular emphysema (HCC), IBS    PT Comments  Pt ready for session despite B UE pain and back discomfort he is able to transition to sitting with max a x 1 and remain sitting with no LOB x 5 minutes.  Overall does well but limited by general fatigue.  Attempted lateral scoot in sitting and he is unable to assist and relies on max a x 1 to slightly move up in bed despite encouragement to assist with BLE's.   If plan is discharge home, recommend the following: Two people to help with walking and/or transfers;Two people to help with bathing/dressing/bathroom;Assistance with cooking/housework;Assistance with feeding;Direct supervision/assist for medications management;Direct supervision/assist for financial management;Assist for transportation;Help with stairs or ramp for entrance   Can travel by private vehicle        Equipment Recommendations       Recommendations for Other Services       Precautions / Restrictions Precautions Precautions: Fall Recall of Precautions/Restrictions: Impaired Precaution/Restrictions Comments: R shoulder pain > L shoulder discomfort Restrictions Weight Bearing Restrictions Per Provider Order: No     Mobility  Bed Mobility Overal bed mobility: Needs Assistance Bed  Mobility: Supine to Sit, Sit to Supine     Supine to sit: Mod assist, Max assist, HOB elevated Sit to supine: Max assist        Transfers                   General transfer comment: deferred.  unable to assist with BUE's and poor push power from LE;'s to assist with lateral scoot    Ambulation/Gait                   Stairs             Wheelchair Mobility     Tilt Bed    Modified Rankin (Stroke Patients Only)       Balance Overall balance assessment: Needs assistance Sitting-balance support: Feet supported Sitting balance-Leahy Scale: Fair                                      Musician Factors Affecting Communication: Difficulty expressing self;Reduced clarity of speech  Cognition Arousal: Alert Behavior During Therapy: Flat affect   PT - Cognitive impairments: No apparent impairments                         Following commands: Intact Following commands impaired: Follows one step commands with increased time    Cueing    Exercises      General Comments        Pertinent Vitals/Pain Pain Assessment Pain Assessment: Faces Faces Pain Scale: Hurts even more Pain Location: R shoulder with any attempted movement Pain Descriptors /  Indicators: Moaning, Grimacing Pain Intervention(s): Limited activity within patient's tolerance, Monitored during session, Repositioned    Home Living                          Prior Function            PT Goals (current goals can now be found in the care plan section) Progress towards PT goals: Progressing toward goals    Frequency    Min 1X/week      PT Plan      Co-evaluation              AM-PAC PT "6 Clicks" Mobility   Outcome Measure  Help needed turning from your back to your side while in a flat bed without using bedrails?: Total Help needed moving from lying on your back to sitting on the side of a flat bed without using  bedrails?: Total Help needed moving to and from a bed to a chair (including a wheelchair)?: Total Help needed standing up from a chair using your arms (e.g., wheelchair or bedside chair)?: Total Help needed to walk in hospital room?: Total Help needed climbing 3-5 steps with a railing? : Total 6 Click Score: 6    End of Session   Activity Tolerance: Patient limited by pain;Patient limited by fatigue Patient left: in bed;with call bell/phone within reach;with family/visitor present;with bed alarm set   PT Visit Diagnosis: Unsteadiness on feet (R26.81);Muscle weakness (generalized) (M62.81);History of falling (Z91.81);Other symptoms and signs involving the nervous system (R29.898);Hemiplegia and hemiparesis;Pain Hemiplegia - Right/Left: Right Hemiplegia - dominant/non-dominant: Dominant Hemiplegia - caused by: Cerebral infarction Pain - Right/Left: Right Pain - part of body: Shoulder     Time: 1327-1340 PT Time Calculation (min) (ACUTE ONLY): 13 min  Charges:    $Therapeutic Activity: 8-22 mins PT General Charges $$ ACUTE PT VISIT: 1 Visit                   Danielle Dess, PTA 12/01/23, 2:26 PM

## 2023-12-01 NOTE — Plan of Care (Signed)
  Problem: Fluid Volume: Goal: Hemodynamic stability will improve 12/01/2023 1747 by Leandro Reasoner, RN Outcome: Progressing 12/01/2023 1746 by Leandro Reasoner, RN Outcome: Progressing   Problem: Clinical Measurements: Goal: Diagnostic test results will improve 12/01/2023 1747 by Leandro Reasoner, RN Outcome: Progressing 12/01/2023 1746 by Leandro Reasoner, RN Outcome: Progressing Goal: Signs and symptoms of infection will decrease 12/01/2023 1747 by Leandro Reasoner, RN Outcome: Progressing 12/01/2023 1746 by Leandro Reasoner, RN Outcome: Progressing   Problem: Respiratory: Goal: Ability to maintain adequate ventilation will improve 12/01/2023 1747 by Leandro Reasoner, RN Outcome: Progressing 12/01/2023 1746 by Leandro Reasoner, RN Outcome: Progressing   Problem: Education: Goal: Knowledge of General Education information will improve Description: Including pain rating scale, medication(s)/side effects and non-pharmacologic comfort measures 12/01/2023 1747 by Leandro Reasoner, RN Outcome: Progressing 12/01/2023 1746 by Leandro Reasoner, RN Outcome: Progressing   Problem: Health Behavior/Discharge Planning: Goal: Ability to manage health-related needs will improve 12/01/2023 1747 by Leandro Reasoner, RN Outcome: Progressing 12/01/2023 1746 by Leandro Reasoner, RN Outcome: Progressing   Problem: Clinical Measurements: Goal: Ability to maintain clinical measurements within normal limits will improve 12/01/2023 1747 by Leandro Reasoner, RN Outcome: Progressing 12/01/2023 1746 by Leandro Reasoner, RN Outcome: Progressing Goal: Will remain free from infection 12/01/2023 1747 by Leandro Reasoner, RN Outcome: Progressing 12/01/2023 1746 by Leandro Reasoner, RN Outcome: Progressing Goal: Diagnostic test results will improve 12/01/2023 1747 by Leandro Reasoner, RN Outcome: Progressing 12/01/2023 1746 by Leandro Reasoner, RN Outcome: Progressing Goal: Respiratory complications will improve 12/01/2023 1747 by Leandro Reasoner, RN Outcome: Progressing 12/01/2023 1746 by Leandro Reasoner, RN Outcome: Progressing Goal: Cardiovascular complication will be avoided 12/01/2023 1747 by Leandro Reasoner, RN Outcome: Progressing 12/01/2023 1746 by Leandro Reasoner, RN Outcome: Progressing   Problem: Activity: Goal: Risk for activity intolerance will decrease 12/01/2023 1747 by Leandro Reasoner, RN Outcome: Progressing 12/01/2023 1746 by Leandro Reasoner, RN Outcome: Progressing

## 2023-12-01 NOTE — Plan of Care (Signed)
  Problem: Fluid Volume: Goal: Hemodynamic stability will improve Outcome: Progressing   Problem: Clinical Measurements: Goal: Diagnostic test results will improve Outcome: Progressing Goal: Signs and symptoms of infection will decrease Outcome: Progressing   Problem: Respiratory: Goal: Ability to maintain adequate ventilation will improve Outcome: Progressing   Problem: Health Behavior/Discharge Planning: Goal: Ability to manage health-related needs will improve Outcome: Progressing   Problem: Education: Goal: Knowledge of General Education information will improve Description: Including pain rating scale, medication(s)/side effects and non-pharmacologic comfort measures Outcome: Progressing   Problem: Clinical Measurements: Goal: Ability to maintain clinical measurements within normal limits will improve Outcome: Progressing Goal: Will remain free from infection Outcome: Progressing Goal: Diagnostic test results will improve Outcome: Progressing Goal: Respiratory complications will improve Outcome: Progressing Goal: Cardiovascular complication will be avoided Outcome: Progressing   Problem: Activity: Goal: Risk for activity intolerance will decrease Outcome: Progressing   Problem: Nutrition: Goal: Adequate nutrition will be maintained Outcome: Progressing   Problem: Elimination: Goal: Will not experience complications related to bowel motility Outcome: Progressing Goal: Will not experience complications related to urinary retention Outcome: Progressing   Problem: Safety: Goal: Ability to remain free from injury will improve Outcome: Progressing   Problem: Skin Integrity: Goal: Risk for impaired skin integrity will decrease Outcome: Progressing   Problem: Education: Goal: Knowledge of disease or condition will improve Outcome: Progressing Goal: Knowledge of secondary prevention will improve (MUST DOCUMENT ALL) Outcome: Progressing Goal: Knowledge of  patient specific risk factors will improve (DELETE if not current risk factor) Outcome: Progressing   Problem: Ischemic Stroke/TIA Tissue Perfusion: Goal: Complications of ischemic stroke/TIA will be minimized Outcome: Progressing

## 2023-12-02 DIAGNOSIS — A419 Sepsis, unspecified organism: Secondary | ICD-10-CM | POA: Diagnosis not present

## 2023-12-02 DIAGNOSIS — J189 Pneumonia, unspecified organism: Secondary | ICD-10-CM | POA: Diagnosis not present

## 2023-12-02 LAB — BASIC METABOLIC PANEL
Anion gap: 9 (ref 5–15)
BUN: 15 mg/dL (ref 8–23)
CO2: 20 mmol/L — ABNORMAL LOW (ref 22–32)
Calcium: 8.6 mg/dL — ABNORMAL LOW (ref 8.9–10.3)
Chloride: 108 mmol/L (ref 98–111)
Creatinine, Ser: 0.93 mg/dL (ref 0.61–1.24)
GFR, Estimated: >60 mL/min (ref >60)
Glucose, Bld: 113 mg/dL — ABNORMAL HIGH (ref 70–99)
Potassium: 3.6 mmol/L (ref 3.5–5.1)
Sodium: 137 mmol/L (ref 135–145)

## 2023-12-02 LAB — CBC
HCT: 35.1 % — ABNORMAL LOW (ref 39.0–52.0)
Hemoglobin: 11.6 g/dL — ABNORMAL LOW (ref 13.0–17.0)
MCH: 28.7 pg (ref 26.0–34.0)
MCHC: 33 g/dL (ref 30.0–36.0)
MCV: 86.9 fL (ref 80.0–100.0)
Platelets: 265 10*3/uL (ref 150–400)
RBC: 4.04 MIL/uL — ABNORMAL LOW (ref 4.22–5.81)
RDW: 15.2 % (ref 11.5–15.5)
WBC: 8.5 10*3/uL (ref 4.0–10.5)
nRBC: 0 % (ref 0.0–0.2)

## 2023-12-02 LAB — MAGNESIUM: Magnesium: 2 mg/dL (ref 1.7–2.4)

## 2023-12-02 LAB — PHOSPHORUS: Phosphorus: 3.6 mg/dL (ref 2.5–4.6)

## 2023-12-02 MED ORDER — CEFAZOLIN SODIUM-DEXTROSE 2-4 GM/100ML-% IV SOLN
2.0000 g | Freq: Once | INTRAVENOUS | Status: AC
  Administered 2023-12-03: 2 g via INTRAVENOUS
  Filled 2023-12-02: qty 100

## 2023-12-02 NOTE — Progress Notes (Signed)
 Heart Failure Navigator Progress Note  Assessed for Heart & Vascular TOC clinic readiness.  Patient does not meet criteria due to current [facility] patient of Dr, Marcina Millard, MD.   Navigator will sign off at this time.  Roxy Horseman, RN, BSN [facility] Heart Failure Navigator Secure Chat Only

## 2023-12-02 NOTE — Progress Notes (Signed)
 Orthopedic evaluation:  Surgical planning for right reverse shoulder arthroplasty to be done tomorrow by Dr. Allena Katz.  Patient is currently comfortable.  Limited range of motion of the shoulder.  No edema or ecchymosis.  Tender to palpation.  Limited grip on the right.  Neurovascular intact.    Patient is currently comfortable and has limited use of the right shoulder.  Planning for surgery tomorrow.

## 2023-12-02 NOTE — Progress Notes (Signed)
 Occupational Therapy Treatment Patient Details Name: Jose Cuevas MRN: [id number] DOB: October 30, 1960 Today's Date: 12/02/2023   History of present illness 63 yo man admitted with L MCA infarct s/p fall at home with seizures and sepsis due to pneumonia. R shoulder x-ray showed dislocation and possible fracture. PMH significant for HLD, HTN, gastroparesis, RA, OSA, Centrilobular emphysema, anxiety and depression, ascending aortic aneurysm & IBS   OT comments  Pt seen for OT tx this date, focusing on bed mobility, improving activity tolerance and seated ADLs with LE exercises. Pt currently requires maxA for supine > sit, able to offer minimal efforts due to impaired functional use of BUEs (pt scheduled for R reverse shoulder arthroplasty on 3/4; RUE kept NWB for comfort with supportive pillow for positioning during session). Once seated, pt able to perform grooming tasks with close supervision for sitting balance. Tolerates sitting ~10 mins to perform LE exercises to improve strength needed for functional transfers. Anticipate pt requiring +2 assist for further progression of mobility. MaxA for lateral scoots, and maxA to return to supine. Pt able to bridge slightly at hips to allow for bed pad repositioning. RN alerted to pt's request for pain meds. Discharge recommendation updated. OT will continue to follow for functional gains.       If plan is discharge home, recommend the following:  Direct supervision/assist for medications management;Direct supervision/assist for financial management;Assist for transportation;Help with stairs or ramp for entrance;Assistance with feeding;Assistance with cooking/housework;Two people to help with walking and/or transfers;Two people to help with bathing/dressing/bathroom;Supervision due to cognitive status   Equipment Recommendations  Other (comment)       Precautions / Restrictions Precautions Precautions: Fall Recall of Precautions/Restrictions:  Impaired Precaution/Restrictions Comments: R shoulder pain > L shoulder discomfort Required Braces or Orthoses: Sling (may wear sling for comfort (sling not in room currently, noted that pt scheduled for surgery tomorrow). RUE NWB throughout session for comfort supported on pillows) Restrictions Weight Bearing Restrictions Per Provider Order: No       Mobility Bed Mobility Overal bed mobility: Needs Assistance Bed Mobility: Supine to Sit, Sit to Supine     Supine to sit: Max assist, HOB elevated Sit to supine: Max assist   General bed mobility comments: pt able to provide minimal effort; required step-by-step cuing, kept NWB of RUE for comfort with little ROM (stabilized by pillow).    Transfers Overall transfer level: Needs assistance   Transfers: Bed to chair/wheelchair/BSC            Lateral/Scoot Transfers: Max assist General transfer comment: pt requires step-by-step cuing, use of bed pad, and maxA from OT to perform lateral scoots up towards HOB. Pt able to assist minimally due to limited use of BUEs     Balance Overall balance assessment: Needs assistance Sitting-balance support: Feet supported Sitting balance-Leahy Scale: Fair                                     ADL either performed or assessed with clinical judgement   ADL Overall ADL's : Needs assistance/impaired     Grooming: Wash/dry hands;Wash/dry face;Sitting;Minimal assistance Grooming Details (indicate cue type and reason): sitting EOB                               General ADL Comments: Pt will require +2 for further mobility progression attempts  Communication Communication Communication: Impaired Factors Affecting Communication: Difficulty expressing self;Reduced clarity of speech   Cognition Arousal: Alert Behavior During Therapy: Flat affect Cognition: Cognition impaired   Orientation impairments: Place, Time, Situation Awareness: Intellectual awareness  impaired, Online awareness impaired Memory impairment (select all impairments): Short-term memory Attention impairment (select first level of impairment): Sustained attention Executive functioning impairment (select all impairments): Initiation, Organization, Sequencing, Reasoning, Problem solving                   Following commands: Intact Following commands impaired: Follows one step commands with increased time      Cueing   Cueing Techniques: Verbal cues, Tactile cues, Visual cues, Gestural cues  Exercises Exercises: General Lower Extremity General Exercises - Lower Extremity Long Arc Quad: Strengthening, Seated, Both, 10 reps Hip Flexion/Marching: Both, 10 reps, Seated, Strengthening            Pertinent Vitals/ Pain       Pain Assessment Pain Assessment: 0-10 Pain Score: 9  ("a little" start of session) Pain Location: low back, R shoulder, "everywhere" Pain Descriptors / Indicators: Moaning, Grimacing Pain Intervention(s): Limited activity within patient's tolerance, Patient requesting pain meds-RN notified, Repositioned, Monitored during session         Frequency  Min 1X/week        Progress Toward Goals  OT Goals(current goals can now be found in the care plan section)  Progress towards OT goals: Progressing toward goals  Acute Rehab OT Goals OT Goal Formulation: With patient/family Time For Goal Achievement: 12/12/23 Potential to Achieve Goals: Good ADL Goals Pt Will Perform Grooming: with modified independence;sitting Pt Will Perform Lower Body Dressing: with modified independence;sitting/lateral leans;sit to/from stand Pt Will Transfer to Toilet: with modified independence;ambulating Pt Will Perform Toileting - Clothing Manipulation and hygiene: with modified independence;sit to/from stand;sitting/lateral leans  Plan         AM-PAC OT "6 Clicks" Daily Activity     Outcome Measure   Help from another person eating meals?: A Lot Help from  another person taking care of personal grooming?: A Lot Help from another person toileting, which includes using toliet, bedpan, or urinal?: A Lot Help from another person bathing (including washing, rinsing, drying)?: A Lot Help from another person to put on and taking off regular upper body clothing?: A Lot Help from another person to put on and taking off regular lower body clothing?: A Lot 6 Click Score: 12    End of Session    OT Visit Diagnosis: Other abnormalities of gait and mobility (R26.89);Muscle weakness (generalized) (M62.81)   Activity Tolerance Patient limited by pain   Patient Left in bed;with call bell/phone within reach;with bed alarm set;with family/visitor present   Nurse Communication Patient requests pain meds        Time: 1610-9604 OT Time Calculation (min): 30 min  Charges: OT General Charges $OT Visit: 1 Visit OT Treatments $Self Care/Home Management : 8-22 mins $Therapeutic Activity: 8-22 mins  Anddy Wingert L. Truxton Stupka, OTR/L  12/02/23, 1:17 PM

## 2023-12-02 NOTE — Progress Notes (Signed)
 [facility] Progress Note  Patient: Jose Cuevas    HYQ:[id number]  DOA: 11/26/2023     Date of Service: the patient was seen and examined on 12/02/2023  Chief Complaint  Patient presents with   unresponsive   Brief hospital course: HAROON SHATTO is a 63 y.o. Caucasian male with medical history significant for hypertension, dyslipidemia, gastroparesis, rheumatoid arthritis, OSA and DDD, who presented to the emergency room with acute onset of tonic-clonic seizure witnessed by his family with postictal confusion and lack of memory about the event.  The patient later had nausea and vomiting.  Did not any fever or chills.  No chest pain or palpitations.  He had right shoulder pain when he fell during his seizure.  The patient denies any significant cough or wheezing or dyspnea.  No paresthesias or focal muscle weakness.  He had no urinary or stool incontinence.  No tongue bites.  No dysuria, oliguria or hematuria or flank pain.   ED Course: When the patient came to the ER, BP was 151/84 with pulse oximetry of 98% on 4 L of O2 by nasal cannula and later respiratory rate was 30 and otherwise normal vital signs.  Labs revealed hypokalemia 3.1 and creatinine 1.33 above previous levels.  High-sensitivity troponin I was 54 and later 125.  Lactic acid was 4.4 and later 2.5.  CBC showed leukocytosis 21.6 with neutrophilia. EKG as reviewed by me : EKG showed sinus rhythm with rate of 92 and left bundle branch block. Imaging: Right shoulder x-ray showed osteoarthritis with no acute displaced fracture.  It showed emphysema with stable right pleural thickening versus small pleural effusion.  Portable chest x-ray showed no acute cardiopulmonary disease. Chest CTA revealed the following: 1. No evidence of significant pulmonary embolus. 2. Cardiac enlargement with left ventricular hypertrophy. 3. Probable loculated collection in the right pleural space, possibly related to infection. Infiltrates in the left 4. Small  left pleural effusion with basilar atelectasis. 5. Diffuse emphysematous changes throughout the lungs with subpleural fibrosis. 6. Multiple pulmonary nodules. Most significant: Right solid pulmonary nodule within the upper lobe measuring 7 mm. Per Fleischner Society Guidelines, recommend a non-contrast Chest CT at 3-6 months, then another non-contrast Chest CT at 18-24 months. 7. Hilar and mediastinal lymphadenopathy is nonspecific and could represent reactive change versus lymphoproliferative process. Similar appearance to previous study. 8. 4.2 cm diameter ascending thoracic aortic aneurysm. Recommend annual imaging followup by CTA or MRA. 9. Aortic atherosclerosis.   Noncontrasted head CT scan revealed no acute intracranial normalities.  It showed bilateral mastoid effusions.  The patient was given IV Rocephin and Zithromax, p.o. ibuprofen, 4 mg of IV Zofran, 40 mill Cabbell of p.o. potassium chloride and 1 L bolus of IV normal saline.  He will be admitted to a medical telemetry bed for further evaluation and management.   Assessment and Plan:  # Acute left MCA infarct MRI: Positive for several punctate acute infarcts scattered in the Left MCA territory. Associated hemorrhage or mass effect. DAPT x 90 days f/b ASA 81 mg pod LDL 47, below goal 70, TSH 1.8 within normal range Held statin due to elevated CK level, but he does not need a statin as per neurology because LDL is below goal. Continue telemetry Continue NIH stroke scale as per protocol Follow PT OT and SLP TTE LVEF 35 to 40%, no WMA, moderate MR and AR Follow neurology for further recommendation 2/28 held Plavix due to right shoulder surgery planned on Tuesday, 12/03/2023   #  Right shoulder dislocation and possible fracture?  S/p fall and seizures Hold Plavix for now, continue aspirin Pain meds as needed Currently patient is not stable for surgery Discussed with orthopedic surgery, patient is scheduled for reverse shoulder  arthroplasty tomorrow a.m. Keep n.p.o. after midnight   # Sepsis due to pneumonia  # Acute respiratory failure with hypoxia due to PNA, Resolved Sepsis manifested by leukocytosis and tachypnea. S/p IV Rocephin x 5 days and Zithromax x 3 days, completed course Continue mucolytic therapy  and DuoNeb p.r.n. Blood cultures NGTD S/p IVF, and s/p NS 50 mL/h 2/28, fever Tmax 101. blood cultures NGTD, s/p Antibiotics Negative COVID flu and RSV Supplemental O2 inhalation weaned off, currently saturating well on room air   New onset seizure most likely due to acute CVA Continue Keppra 500 twice daily as per neuro - Continue seizure precautions.  EEG negative - Neurology consulted, recommended MRI brain and C-spine.  MRI positive for acute CVA -Continue prn IV Ativan for now.    Elevated troponin, most likely demand ischemia Trop peaked 174 Patient denies any chest pain TTE LVEF 35 to 40%, no WMA, moderate MR and AR  Rhabdomyolysis most likely due to seizures.  Resolved CK 1700--625--318 S/p IV fluid, continue gentle hydration and encourage oral intake  Hypokalemia, potassium repleted Hypomagnesemia, mag repleted. Monitor electrolytes and replete as needed.    AKI (acute kidney injury) resolved Renal functions improved after IV fluid Monitor renal functions daily Discontinued Lasix and hydrochlorothiazide for now Metabolic acidosis, started oral bicarbonate. Monitor BMP daily    Centrilobular emphysema (HCC) - The patient be placed on scheduled and as needed DuoNebs. - Mucolytic therapy will be provided.   GERD without esophagitis - We will continue PPI therapy.   Anxiety and depression - We will continue Klonopin, Remeron and Cymbalta.   Dyslipidemia - Hold the statin for now due to elevated CK level   IBS (irritable bowel syndrome) - continue Amitiza.   Essential hypertension Resumed losartan 100 mg p.o. daily with holding parameters Discontinued Lasix and  hydrochlorothiazide Monitor BP and titrate medications accordingly  Rheumatoid arthritis with rheumatoid factor (HCC) -continue methotrexate.   Pulmonary nodules, incidental finding CT scan: Right solid pulmonary nodule within the upper lobe measuring 7 mm. Per Fleischner Society Guidelines, recommend a non-contrast Chest CT at 3-6 months, then another non-contrast Chest CT at 18-24 months. Recommended to follow with PCP and pulmonary as an outpatient  Ascending aortic aneurysm, incidental finding CT scan: 4.2 cm diameter ascending thoracic aortic aneurysm. Recommend annual imaging followup by CTA or MRA. 9. Aortic atherosclerosis. Follow with PCP as an outpatient  Vitamin D insufficiency, started vitamin D 50,000 units p.o. weekly.  Body mass index is 22.15 kg/m.  Interventions:  Diet: Heart healthy diet DVT Prophylaxis: Subcutaneous Lovenox   Advance goals of care discussion: Full code  Family Communication: family was present at bedside, at the time of interview.  The pt provided permission to discuss medical plan with the family. Opportunity was given to ask question and all questions were answered satisfactorily.   Disposition:  Pt is from Home, admitted with Acute CVA, seizures, respiratory failure, pneumonia, right shoulder dislocation, which precludes a safe discharge. Scheduled for right shoulder surgery on 2/4 Discharge to SNF, TBD after PT/OT eval, when stable.  Subjective: No significant overnight event, patient was sleepy but he woke up by calling his name.  Patient was feeling improvement, still has pain in the right arm on movement, no pain at rest.  Denied any worsening of shortness of breath, no chest pain or palpitation, no any other complaints.   Physical Exam: General: NAD, looked tired Appear in no distress, affect depressed Eyes: PERRLA   ENT: Oral Mucosa Clear, moist  Neck: no JVD,  Cardiovascular: S1 and S2 Present, no Murmur,  Respiratory: Equal  air entry bilaterally, mild crackles, no wheezes.   Abdomen: Bowel Sound present, Soft and no tenderness,  Skin: no rashes Extremities: no Pedal edema, no calf tenderness Neurologic: CN grossly intact, RUE power 1-2/5, RLE power 3/5, left side intact Gait not checked due to patient safety concerns  Vitals:   12/01/23 2039 12/02/23 0103 12/02/23 0503 12/02/23 0724  BP: 122/69 133/77 (!) 142/79 (!) 146/77  Pulse: 92 72 79 84  Resp:    18  Temp: 98.4 F (36.9 C) 98.6 F (37 C) 98.5 F (36.9 C) 98.3 F (36.8 C)  TempSrc: Oral Oral Oral Oral  SpO2: 93% 94% 98% 96%  Weight:      Height:        Intake/Output Summary (Last 24 hours) at 12/02/2023 1429 Last data filed at 12/02/2023 0900 Gross per 24 hour  Intake --  Output 800 ml  Net -800 ml   Filed Weights   11/26/23 1716  Weight: 68 kg    Data Reviewed: I have personally reviewed and interpreted daily labs, tele strips, imagings as discussed above. I reviewed all nursing notes, pharmacy notes, vitals, pertinent old records I have discussed plan of care as described above with RN and patient/family.  CBC: Recent Labs  Lab 11/26/23 1709 11/27/23 0620 11/28/23 0559 11/29/23 0456 11/30/23 0518 12/01/23 0216 12/02/23 0230  WBC 21.6*   < > 10.4 8.9 9.2 10.9* 8.5  NEUTROABS 12.5*  --   --  5.9  --   --   --   HGB 14.5   < > 11.2* 11.4* 11.7* 11.5* 11.6*  HCT 44.6   < > 34.0* 34.7* 35.7* 34.8* 35.1*  MCV 86.4   < > 85.2 87.0 85.8 87.0 86.9  PLT 307   < > 199 176 226 256 265   < > = values in this interval not displayed.   Basic Metabolic Panel: Recent Labs  Lab 11/28/23 0559 11/29/23 0409 11/29/23 0456 11/30/23 0518 12/01/23 0216 12/02/23 0230  NA 133*  --  136 140 138 137  K 4.3  --  4.1 3.9 3.4* 3.6  CL 105  --  109 111 109 108  CO2 19*  --  20* 19* 20* 20*  GLUCOSE 122*  --  113* 82 123* 113*  BUN 12  --  13 14 17 15   CREATININE 1.15  --  1.10 0.91 0.95 0.93  CALCIUM 8.2*  --  8.3* 8.6* 8.6* 8.6*  MG 2.1  1.9  --  1.9 1.8 2.0  PHOS 3.2 3.3  --  3.3 2.9 3.6    Studies: No results found.   Scheduled Meds:  aspirin EC  81 mg Oral Daily   [START ON 12/04/2023] clopidogrel  75 mg Oral Daily   cyanocobalamin  1,000 mcg Oral Daily   DULoxetine  30 mg Oral QHS   enoxaparin (LOVENOX) injection  40 mg Subcutaneous Q24H   folic acid  1 mg Oral Daily   guaiFENesin  600 mg Oral BID   hydroxychloroquine  200 mg Oral 2 times per day on Monday Tuesday Wednesday Thursday Friday   hydroxychloroquine  200 mg Oral Once per day on Sunday Saturday  levETIRAcetam  500 mg Oral BID   lubiprostone  24 mcg Oral BID   magnesium oxide  400 mg Oral BID   metoCLOPramide  10 mg Oral TID AC   metoprolol tartrate  25 mg Oral BID   mirtazapine  15 mg Oral QHS   pantoprazole  40 mg Oral BID   tamsulosin  0.4 mg Oral Daily   traZODone  50 mg Oral QHS   Vitamin D (Ergocalciferol)  50,000 Units Oral Q7 days   Continuous Infusions:   PRN Meds: acetaminophen **OR** acetaminophen, albuterol, chlorpheniramine-HYDROcodone, LORazepam, magnesium hydroxide, morphine injection, ondansetron **OR** ondansetron (ZOFRAN) IV, oxyCODONE, polyvinyl alcohol  Time spent: 35 minutes  Author: Gillis Santa. MD [facility] 12/02/2023 2:29 PM  To reach On-call, see care teams to locate the attending and reach out to them via [email or web address]. If 7PM-7AM, please contact night-coverage If you still have difficulty reaching the attending provider, please page the [facility] (Director on Call) for [facility] on amion for assistance.

## 2023-12-03 ENCOUNTER — Other Ambulatory Visit: Payer: Self-pay

## 2023-12-03 ENCOUNTER — Encounter: Payer: Self-pay | Admitting: Family Medicine

## 2023-12-03 ENCOUNTER — Inpatient Hospital Stay: Payer: Self-pay | Admitting: Certified Registered"

## 2023-12-03 ENCOUNTER — Inpatient Hospital Stay

## 2023-12-03 ENCOUNTER — Encounter
Admission: EM | Disposition: A | Discharge status: Skilled Nursing Facility | Payer: Self-pay | Source: Home / Self Care | Attending: Student

## 2023-12-03 DIAGNOSIS — A419 Sepsis, unspecified organism: Secondary | ICD-10-CM | POA: Diagnosis not present

## 2023-12-03 DIAGNOSIS — J189 Pneumonia, unspecified organism: Secondary | ICD-10-CM | POA: Diagnosis not present

## 2023-12-03 HISTORY — PX: REVERSE SHOULDER ARTHROPLASTY: SHX5054

## 2023-12-03 LAB — BASIC METABOLIC PANEL
Anion gap: 9 (ref 5–15)
BUN: 14 mg/dL (ref 8–23)
CO2: 22 mmol/L (ref 22–32)
Calcium: 8.9 mg/dL (ref 8.9–10.3)
Chloride: 107 mmol/L (ref 98–111)
Creatinine, Ser: 0.89 mg/dL (ref 0.61–1.24)
GFR, Estimated: >60 mL/min (ref >60)
Glucose, Bld: 99 mg/dL (ref 70–99)
Potassium: 3.6 mmol/L (ref 3.5–5.1)
Sodium: 138 mmol/L (ref 135–145)

## 2023-12-03 LAB — CBC
HCT: 34.3 % — ABNORMAL LOW (ref 39.0–52.0)
Hemoglobin: 11.5 g/dL — ABNORMAL LOW (ref 13.0–17.0)
MCH: 29 pg (ref 26.0–34.0)
MCHC: 33.5 g/dL (ref 30.0–36.0)
MCV: 86.4 fL (ref 80.0–100.0)
Platelets: 294 10*3/uL (ref 150–400)
RBC: 3.97 MIL/uL — ABNORMAL LOW (ref 4.22–5.81)
RDW: 15.1 % (ref 11.5–15.5)
WBC: 9.1 10*3/uL (ref 4.0–10.5)
nRBC: 0 % (ref 0.0–0.2)

## 2023-12-03 SURGERY — ARTHROPLASTY, SHOULDER, TOTAL, REVERSE
Anesthesia: General | Site: Shoulder | Laterality: Right

## 2023-12-03 MED ORDER — LACTATED RINGERS IV SOLN: INTRAVENOUS | Status: DC | PRN

## 2023-12-03 MED ORDER — MIDAZOLAM HCL 2 MG/2ML IJ SOLN: 1.0000 mg | INTRAMUSCULAR | Status: DC | PRN

## 2023-12-03 MED ORDER — MIDAZOLAM HCL 2 MG/2ML IJ SOLN
INTRAMUSCULAR | Status: AC
  Filled 2023-12-03: qty 2

## 2023-12-03 MED ORDER — FENTANYL CITRATE (PF) 100 MCG/2ML IJ SOLN
INTRAMUSCULAR | Status: AC
Start: 2023-12-03 — End: ?
  Filled 2023-12-03: qty 2

## 2023-12-03 MED ORDER — BUPIVACAINE-EPINEPHRINE (PF) 0.25% -1:200000 IJ SOLN
INTRAMUSCULAR | Status: AC
  Filled 2023-12-03: qty 30

## 2023-12-03 MED ORDER — PHENYLEPHRINE HCL-NACL 20-0.9 MG/250ML-% IV SOLN
INTRAVENOUS | Status: DC | PRN
Start: 2023-12-03 — End: 2023-12-03
  Administered 2023-12-03: 6.667 ug/min via INTRAVENOUS

## 2023-12-03 MED ORDER — PHENYLEPHRINE HCL-NACL 20-0.9 MG/250ML-% IV SOLN
INTRAVENOUS | Status: AC
  Filled 2023-12-03: qty 250

## 2023-12-03 MED ORDER — SUGAMMADEX SODIUM 200 MG/2ML IV SOLN
INTRAVENOUS | Status: AC
  Filled 2023-12-03: qty 2

## 2023-12-03 MED ORDER — FENTANYL CITRATE (PF) 100 MCG/2ML IJ SOLN: 25.0000 ug | INTRAMUSCULAR | Status: DC | PRN

## 2023-12-03 MED ORDER — SUGAMMADEX SODIUM 200 MG/2ML IV SOLN
INTRAVENOUS | Status: DC | PRN
  Administered 2023-12-03 (×2): 200 mg via INTRAVENOUS

## 2023-12-03 MED ORDER — ACETAMINOPHEN 10 MG/ML IV SOLN: 1000.0000 mg | Freq: Once | INTRAVENOUS | Status: DC | PRN

## 2023-12-03 MED ORDER — SUCCINYLCHOLINE CHLORIDE 200 MG/10ML IV SOSY
PREFILLED_SYRINGE | INTRAVENOUS | Status: DC | PRN
  Administered 2023-12-03: 100 mg via INTRAVENOUS

## 2023-12-03 MED ORDER — FENTANYL CITRATE (PF) 100 MCG/2ML IJ SOLN
INTRAMUSCULAR | Status: DC | PRN
Start: 2023-12-03 — End: 2023-12-03
  Administered 2023-12-03 (×4): 50 ug via INTRAVENOUS

## 2023-12-03 MED ORDER — DEXAMETHASONE SODIUM PHOSPHATE 10 MG/ML IJ SOLN
INTRAMUSCULAR | Status: DC | PRN
  Administered 2023-12-03: 10 mg via INTRAVENOUS

## 2023-12-03 MED ORDER — VANCOMYCIN HCL 1000 MG IV SOLR
INTRAVENOUS | Status: AC
  Filled 2023-12-03: qty 20

## 2023-12-03 MED ORDER — LIDOCAINE HCL (CARDIAC) PF 100 MG/5ML IV SOSY: PREFILLED_SYRINGE | INTRAVENOUS | Status: DC | PRN

## 2023-12-03 MED ORDER — BUPIVACAINE-EPINEPHRINE (PF) 0.25% -1:200000 IJ SOLN
INTRAMUSCULAR | Status: DC | PRN
  Administered 2023-12-03: 50 mL via INTRAMUSCULAR

## 2023-12-03 MED ORDER — PROPOFOL 10 MG/ML IV BOLUS
INTRAVENOUS | Status: DC | PRN
  Administered 2023-12-03: 100 mg via INTRAVENOUS

## 2023-12-03 MED ORDER — KETAMINE HCL 50 MG/5ML IJ SOSY
PREFILLED_SYRINGE | INTRAMUSCULAR | Status: DC | PRN
  Administered 2023-12-03 (×2): 20 mg via INTRAVENOUS
  Administered 2023-12-03: 10 mg via INTRAVENOUS

## 2023-12-03 MED ORDER — ONDANSETRON HCL 4 MG/2ML IJ SOLN: 4.0000 mg | Freq: Once | INTRAMUSCULAR | Status: DC | PRN

## 2023-12-03 MED ORDER — OXYCODONE HCL 5 MG PO TABS: 5.0000 mg | ORAL_TABLET | Freq: Once | ORAL | Status: DC | PRN

## 2023-12-03 MED ORDER — SODIUM CHLORIDE 0.9 % IR SOLN
Status: DC | PRN
  Administered 2023-12-03: 3000 mL

## 2023-12-03 MED ORDER — VASOPRESSIN 20 UNIT/ML IV SOLN
INTRAVENOUS | Status: DC | PRN
  Administered 2023-12-03: 2 [IU] via INTRAVENOUS
  Administered 2023-12-03 (×4): 1 [IU] via INTRAVENOUS

## 2023-12-03 MED ORDER — GLYCOPYRROLATE 0.2 MG/ML IJ SOLN
INTRAMUSCULAR | Status: DC | PRN
  Administered 2023-12-03: .2 mg via INTRAVENOUS

## 2023-12-03 MED ORDER — VANCOMYCIN HCL 1000 MG IV SOLR
INTRAVENOUS | Status: DC | PRN
  Administered 2023-12-03: 1000 mg via TOPICAL

## 2023-12-03 MED ORDER — KETAMINE HCL 50 MG/5ML IJ SOSY
PREFILLED_SYRINGE | INTRAMUSCULAR | Status: AC
  Filled 2023-12-03: qty 5

## 2023-12-03 MED ORDER — FENTANYL CITRATE (PF) 100 MCG/2ML IJ SOLN
INTRAMUSCULAR | Status: AC
  Filled 2023-12-03: qty 2

## 2023-12-03 MED ORDER — ROCURONIUM BROMIDE 100 MG/10ML IV SOLN
INTRAVENOUS | Status: DC | PRN
  Administered 2023-12-03 (×2): 20 mg via INTRAVENOUS
  Administered 2023-12-03: 50 mg via INTRAVENOUS
  Administered 2023-12-03: 10 mg via INTRAVENOUS

## 2023-12-03 MED ORDER — LOSARTAN POTASSIUM 25 MG PO TABS
25.0000 mg | ORAL_TABLET | Freq: Every day | ORAL | Status: DC
  Administered 2023-12-04 – 2023-12-05 (×2): 25 mg via ORAL
  Filled 2023-12-03 (×2): qty 1

## 2023-12-03 MED ORDER — OXYCODONE HCL 5 MG/5ML PO SOLN: 5.0000 mg | Freq: Once | ORAL | Status: DC | PRN

## 2023-12-03 MED ORDER — BUPIVACAINE LIPOSOME 1.3 % IJ SUSP
INTRAMUSCULAR | Status: AC
Start: 2023-12-03 — End: ?
  Filled 2023-12-03: qty 20

## 2023-12-03 SURGICAL SUPPLY — 82 items
BIT DRILL 12.7X2STRG SHNK (BIT) IMPLANT
BIT DRILL 3 FOR 4.5 SCREW (BIT) ×1 IMPLANT
BIT DRILL 3MM FOR 4.5 SCREW (BIT) IMPLANT
BIT DRL 12.7X2STRG SHNK (BIT) ×1 IMPLANT
BLADE SAGITTAL WIDE XTHICK NO (BLADE) ×1 IMPLANT
BSPLAT GLND 10D RSS AETOS (Plate) ×1 IMPLANT
CABLE CERLAGE W/CRIMP 1.8 (Cable) IMPLANT
CHLORAPREP W/TINT 26 (MISCELLANEOUS) ×1 IMPLANT
CNTNR URN SCR LID CUP LEK RST (MISCELLANEOUS) IMPLANT
COOLER POLAR GLACIER W/PUMP (MISCELLANEOUS) ×1 IMPLANT
DERMABOND ADVANCED .7 DNX12 (GAUZE/BANDAGES/DRESSINGS) IMPLANT
DRAPE INCISE IOBAN 66X45 STRL (DRAPES) ×2 IMPLANT
DRAPE SHEET LG 3/4 BI-LAMINATE (DRAPES) ×2 IMPLANT
DRAPE TABLE BACK 80X90 (DRAPES) ×1 IMPLANT
DRAPE U-SHAPE 47X51 STRL (DRAPES) ×1 IMPLANT
DRSG OPSITE POSTOP 3X4 (GAUZE/BANDAGES/DRESSINGS) IMPLANT
DRSG OPSITE POSTOP 4X6 (GAUZE/BANDAGES/DRESSINGS) IMPLANT
DRSG OPSITE POSTOP 4X8 (GAUZE/BANDAGES/DRESSINGS) IMPLANT
DRSG TEGADERM 2-3/8X2-3/4 SM (GAUZE/BANDAGES/DRESSINGS) IMPLANT
ELECT REM PT RETURN 9FT ADLT (ELECTROSURGICAL) ×1 IMPLANT
ELECTRODE REM PT RTRN 9FT ADLT (ELECTROSURGICAL) ×1 IMPLANT
EVACUATOR 1/8 PVC DRAIN (DRAIN) IMPLANT
GAUZE SPONGE 2X2 STRL 8-PLY (GAUZE/BANDAGES/DRESSINGS) IMPLANT
GAUZE XEROFORM 1X8 LF (GAUZE/BANDAGES/DRESSINGS) IMPLANT
GLENOID BSPLAT 10D RSS AETOS (Plate) IMPLANT
GLENOSPHERE CON AETOS 38 RSS (Shoulder) IMPLANT
GLOVE BIOGEL PI IND STRL 8 (GLOVE) ×2 IMPLANT
GLOVE PI ULTRA LF STRL 7.5 (GLOVE) ×2 IMPLANT
GLOVE SURG ORTHO 8.0 STRL STRW (GLOVE) ×2 IMPLANT
GLOVE SURG SYN 8.0 (GLOVE) ×1 IMPLANT
GLOVE SURG SYN 8.0 PF PI (GLOVE) ×1 IMPLANT
GOWN STRL REUS W/ TWL LRG LVL3 (GOWN DISPOSABLE) ×2 IMPLANT
GOWN STRL REUS W/ TWL XL LVL3 (GOWN DISPOSABLE) ×1 IMPLANT
GUIDE PIN GLENOID 2.5 NT (PIN) ×1 IMPLANT
GUIDE PIN NUMERAL 3.5 NT (PIN) ×1 IMPLANT
HOOD PEEL AWAY T7 (MISCELLANEOUS) ×2 IMPLANT
INSERT REV AETOS 38 +3 L (Insert) IMPLANT
INSERT REV AETOS 38 +3 L RET (Insert) IMPLANT
KIT STABILIZATION SHOULDER (MISCELLANEOUS) ×1 IMPLANT
MANIFOLD NEPTUNE II (INSTRUMENTS) ×1 IMPLANT
MASK FACE SPIDER DISP (MASK) ×1 IMPLANT
MAT ABSORB FLUID 56X50 GRAY (MISCELLANEOUS) ×1 IMPLANT
NDL REVERSE CUT 1/2 CRC (NEEDLE) IMPLANT
NDL SPNL 20GX3.5 QUINCKE YW (NEEDLE) IMPLANT
NEEDLE REVERSE CUT 1/2 CRC (NEEDLE) IMPLANT
NEEDLE SPNL 20GX3.5 QUINCKE YW (NEEDLE) ×1 IMPLANT
NS IRRIG 1000ML POUR BTL (IV SOLUTION) ×1 IMPLANT
PACK ARTHROSCOPY SHOULDER (MISCELLANEOUS) ×1 IMPLANT
PAD WRAPON POLAR SHDR XLG (MISCELLANEOUS) ×1 IMPLANT
PIN GUIDE GLENOID 2.5 NT (PIN) IMPLANT
PIN GUIDE NUMERAL 3.5 NT (PIN) IMPLANT
PULSAVAC PLUS IRRIG FAN TIP (DISPOSABLE) ×1 IMPLANT
SCREW CENTER 4.5X30 RSS (Screw) IMPLANT
SCREW LOCK 4.5X30 RSS (Screw) IMPLANT
SCREW LOCK 4.5X35 RSS (Screw) IMPLANT
SCREW LOCKING 4.5X25 RSS (Screw) IMPLANT
SCREW LOCKING 4.5X30 RSS (Screw) IMPLANT
SLEEVE PROTECTION STRL DISP (MISCELLANEOUS) IMPLANT
SLING ULTRA II LG (MISCELLANEOUS) IMPLANT
SLING ULTRA II M (MISCELLANEOUS) IMPLANT
SPONGE T-LAP 18X18 ~~LOC~~+RFID (SPONGE) ×1 IMPLANT
STAPLER SKIN PROX 35W (STAPLE) IMPLANT
STEM HUM AETOS SHLD 4 L (Shoulder) IMPLANT
STRAP SAFETY 5IN WIDE (MISCELLANEOUS) ×1 IMPLANT
SUT ETHIBOND 5-0 MS/4 CCS GRN (SUTURE) ×1 IMPLANT
SUT FIBERWIRE #2 38 BLUE 1/2 (SUTURE) ×4 IMPLANT
SUT MNCRL AB 4-0 PS2 18 (SUTURE) IMPLANT
SUT PROLENE 6 0 P 1 18 (SUTURE) IMPLANT
SUT TICRON 2-0 30IN 311381 (SUTURE) ×2 IMPLANT
SUT VIC AB 0 CT1 36 (SUTURE) ×1 IMPLANT
SUT VIC AB 2-0 CT2 27 (SUTURE) ×2 IMPLANT
SUT XBRAID 1.4 BLK/WHT (SUTURE) IMPLANT
SUT XBRAID 1.4 BLUE (SUTURE) IMPLANT
SUT XBRAID 1.4 WHITE/BLUE (SUTURE) IMPLANT
SUT XBRAID 2 BLACK/BLUE (SUTURE) IMPLANT
SUTURE ETHBND 5-0 MS/4 CCS GRN (SUTURE) ×1 IMPLANT
SUTURE FIBERWR #2 38 BLUE 1/2 (SUTURE) ×1 IMPLANT
SYR 30ML LL (SYRINGE) IMPLANT
TIP FAN IRRIG PULSAVAC PLUS (DISPOSABLE) ×1 IMPLANT
TRAP FLUID SMOKE EVACUATOR (MISCELLANEOUS) ×1 IMPLANT
WATER STERILE IRR 500ML POUR (IV SOLUTION) ×1 IMPLANT
WRAPON POLAR PAD SHDR XLG (MISCELLANEOUS) ×1 IMPLANT

## 2023-12-03 NOTE — Transfer of Care (Signed)
 Immediate Anesthesia Transfer of Care Note  Patient: Jose Cuevas  Procedure(s) Performed: REVERSE SHOULDER ARTHROPLASTY (Right: Shoulder)  Patient Location: PACU  Anesthesia Type:General  Level of Consciousness: sedated  Airway & Oxygen Therapy: Patient Spontanous Breathing and Patient connected to face mask oxygen  Post-op Assessment: Report given to RN and Post -op Vital signs reviewed and stable  Post vital signs: Reviewed and stable  Last Vitals:  Vitals Value Taken Time  BP 113/73 12/03/23 1840  Temp    Pulse 92 12/03/23 1840  Resp 22 12/03/23 1840  SpO2 96 % 12/03/23 1840  Vitals shown include unfiled device data.  Last Pain:  Vitals:   12/03/23 1414  TempSrc: Temporal  PainSc: 9       Patients Stated Pain Goal: 0 (11/29/23 0254)  Complications: No notable events documented.

## 2023-12-03 NOTE — Progress Notes (Signed)
 OT Cancellation Note  Patient Details Name: Jose Cuevas MRN: [id number] DOB: 1960-10-26   Cancelled Treatment:    Reason Eval/Treat Not Completed: Other (comment). Chart reviewed, pt is scheduled for R reverse shoulder arthroplasty this afternoon. Will see patient when medically appropriate after surgery pending new or continuation of current orders.    [name], OTR/L  12/03/23, 12:12 PM

## 2023-12-03 NOTE — H&P (Signed)
 H&P reviewed. Also see consult note from  11/29/23. No significant changes from consult note.

## 2023-12-03 NOTE — Op Note (Signed)
 Operative Note    SURGERY DATE: 12/03/2023   PRE-OP DIAGNOSIS:  1. Right posterior glenohumeral dislocation with large reverse Hill-Sachs lesion   POST-OP DIAGNOSIS:  1. Right posterior glenohumeral dislocation with large reverse Hill-Sachs lesion   PROCEDURES:  1. Right shoulder closed reduction under anesthesia 2. Right reverse total shoulder arthroplasty 3. Right biceps tenodesis   SURGEON: Rosealee Albee, MD  ASSISTANTS: Dedra Skeens, PA   ANESTHESIA: Gen   ESTIMATED BLOOD LOSS: 200cc   TOTAL IV FLUIDS: see anesthesia record  IMPLANTS: S&N [employer] 10 deg Augmented baseplate w/central + 4 peripheral 4.54mm screws, 38mm concentric glenosphere; [employer] Size 4 Large Humeral Stem; +3 Retentive Poly Liner  INDICATION(S):  Jose Cuevas is 63 y.o. male who sustained a posterior glenohumeral dislocation after a stroke and seizure.  He has a very large reverse Hill-Sachs lesion. After discussion of risks, benefits, and alternatives to surgery, the patient elected to proceed with closed reduction under anesthesia with the understanding that if there was gross instability, we would proceed with reverse shoulder arthroplasty and biceps tenodesis.   OPERATIVE FINDINGS: Posterior glenohumeral dislocation with large Hill-Sachs lesion; unstable glenohumeral joint after reduction   OPERATIVE REPORT:   I identified Jose Cuevas in the pre-operative holding area. Informed consent was obtained and the surgical site was marked. I reviewed the risks and benefits of the proposed surgical intervention and the patient (and/or patient's guardian) wished to proceed. An interscalene block was not performed at the combination of the anesthesia team due to other comorbidities. The patient was transferred to the operative suite and general anesthesia was administered. The patient was placed in the beach chair position with the head of the bed elevated approximately 30 degrees. All down side pressure points were  appropriately padded. A closed reduction was performed with external rotation and anterior directed force.  When the patient was brought into slight abduction and external rotation the shoulder immediately dislocated posteriorly.  This confirmed that the very large Hill-Sachs lesion was engaging and the patient would be unstable without further surgery.  Appropriate IV antibiotics were administered. Tranexamic acid was also administered after verifying that the patient had no contraindications. The extremity was then prepped and draped in standard fashion. A time out was performed confirming the correct extremity, correct patient, and correct procedure.   We used the standard deltopectoral incision from the coracoid to ~12cm distal.  Vancomycin powder was placed in the dermis.  We found the cephalic vein and took it laterally. We opened the deltopectoral interval widely and placed retractors under the CA ligament in the subacromial space and under the deltoid tendon at its insertion. We then released the underlying bursa between these retractors, taking care not to damage the circumflex branch of the axillary nerve.   We opened the clavipectoral fascia lateral to the conjoint tendon.  The arm was then internally rotated, we cut the falciform ligament at approximately 1 cm of the upper portion of the pectoralis major insertion. Next we unroofed the bicipital groove. Biceps tendon was significantly erythematous and inflamed. We proceeded with a soft tissue biceps tenodesis given the pathology of the tendon.  After opening the biceps tendon sheath all the way to the supraglenoid tubercle, we performed a biceps tenodesis with two #2 TiCron sutures to the upper border of the pectoralis major. The proximal portion of the tendon was excised.   Next, the subscapularis and anterior capsule were peeled off of the lesser tuberosity. We released the inferior capsule from the  humerus all the way to the posterior band of  the inferior glenohumeral ligament. When this was complete we gently dislocated the shoulder up into the wound. We made our cut with the appropriate inclination in 30 degrees of retroversion.   We then turned our attention to the glenoid. The proximal humerus was retracted posteriorly. The anterior capsule was dissected free from the retracted subscapularis. The anterior capsule was then excised, exposing the anterior glenoid. We then grasped the labrum and removed it circumferentially. During the glenoid exposure, the axillary nerve was protected the entire time.    A posterior retractor was used to retract greater tuberosity and the humeral shaft, exposing the glenoid.  The anterior capsule was separated from the subscapularis and excised from the lesser tuberosity to the glenoid.  The labrum and the remnant of the biceps tendon were removed in a circumferential fashion.  The attachment of the triceps muscle was released off of the inferior glenoid.  During the glenoid exposure, the axillary nerve was protected the entire time.  Appropriate retractors were placed and there was excellent glenoid visualization.  The central guidepin was drilled with a 10 degree inferior tilt guide.  On preoperative imaging, the patient had approximately relatively neutral glenoid version.  This was not changed.  The glenoid was reamed in order to remove the cartilaginous surface without taking away too much bone.  The hole for the central post was drilled. The baseplate was impacted in a press-fit fashion. The central screw was placed.  Locking peripheral screws were drilled, measured, and placed.  Peripheral reamer was used to clear out any debris and allow for appropriate glenosphere placement.  Glenosphere was impacted and found to be firmly seated.  Glenosphere screw was tightened.   We then turned our attention to the humerus.  The metaphysis was sized to the above mentioned implants.  Trial stem was placed with ~20  degrees of retroversion. The joint was reduced and noted to have satisfactory stability, motion, and deltoid tension with adequate tuberosity reduction.  Trial implants were removed.  We drilled 3 holes about the lesser tuberosity footprint and FiberWire sutures were passed through these holes for subscapularis repair.  The stem was then inserted into the appropriate position.  However on impaction of the stem, the posterior medial aspect of the proximal humerus was broken and the stem lost appropriate stability.  The bony fragment was Jose in a reduced position and a cerclage cable was placed.  This now afforded appropriate stability to the humeral stem again. Above noted poly size was placed. The humerus was reduced and motion, tension, and stability were satisfactory. A Hemovac drain was placed. The arm was placed in neutral rotation, and the subscapularis was repaired with the previously passed #2 FiberWire sutures after passing them through the subscapularis.    We again verified the tension on the axillary nerve, appropriate range of motion, stability of the implant, and security of the subscapularis repair. We closed the deltopectoral interval with a running, 0-Vicryl suture. The skin was closed with 2-0 Vicryl and staples. Sterile dressing was applied. A PolarCare unit and sling were placed. Patient was extubated, transferred to a stretcher bed and to the post antesthesia care unit in stable condition.   Of note, assistance from a PA was essential to performing the surgery.  PA was present for the entire surgery.  PA assisted with patient positioning, retraction, instrumentation, and wound closure. The surgery would have been more difficult and had longer operative time without  PA assistance.    POSTOPERATIVE PLAN: Operative arm to remain in sling and abduction pillow with arm at neutral at all times except RoM exercises and hygiene. Can perform pendulums, elbow/wrist/hand RoM exercises. Passive RoM  allowed to 90 FF and 30 ER. ASA 325mg  x 6 weeks for DVT ppx. Can resume Plavix on POD#1. Patient to return to clinic in ~2 weeks for post-operative appointment.

## 2023-12-03 NOTE — Anesthesia Preprocedure Evaluation (Signed)
 Anesthesia Evaluation  Patient identified by MRN, date of birth, ID band Patient awake    Reviewed: Allergy & Precautions, NPO status , Patient's Chart, lab work & pertinent test results  History of Anesthesia Complications Negative for: history of anesthetic complications  Airway Mallampati: III  TM Distance: >3 FB Neck ROM: Full    Dental  (+) Edentulous Upper, Edentulous Lower   Pulmonary sleep apnea , pneumonia, resolved, COPD, Current Smoker and Patient abstained from smoking.    + decreased breath sounds      Cardiovascular Exercise Tolerance: Good METShypertension, Pt. on medications + Peripheral Vascular Disease and +CHF  (-) CAD and (-) Past MI (-) dysrhythmias  Rhythm:Regular Rate:Normal - Systolic murmurs  1. Left ventricular ejection fraction, by estimation, is 35 to 40%. The  left ventricle has moderately decreased function. The left ventricle has  no regional wall motion abnormalities. Indeterminate diastolic filling due  to E-A fusion.   2. Right ventricular systolic function is normal. The right ventricular  size is normal. There is normal pulmonary artery systolic pressure.   3. The mitral valve is normal in structure. Moderate mitral valve  regurgitation. No evidence of mitral stenosis.   4. The aortic valve is normal in structure. Aortic valve regurgitation is  moderate. No aortic stenosis is present.   5. The inferior vena cava is normal in size with greater than 50%  respiratory variability, suggesting right atrial pressure of 3 mmHg.     Neuro/Psych Seizures -,  PSYCHIATRIC DISORDERS Anxiety Depression    Recent stroke and seizures. Worked up by neurology who has since signed off and recommended outpatient follow up. CVA    GI/Hepatic ,GERD  ,,(+)     (-) substance abuse    Endo/Other  neg diabetes    Renal/GU negative Renal ROS     Musculoskeletal  (+) Arthritis ,    Abdominal   Peds   Hematology  (+) Blood dyscrasia, anemia   Anesthesia Other Findings Past Medical History: No date: Back pain No date: Collagen vascular disease (HCC)     Comment:  Rhematoid Arthritis hx. No date: Constipation, chronic No date: DDD (degenerative disc disease), cervical No date: History of duodenal ulcer No date: History of kidney stones No date: Hyperlipidemia No date: Hypertension No date: Low kidney function     Comment:  LT Kidney non-functioning No date: Nondiabetic gastroparesis No date: Peripheral blood vessel disorder (HCC) No date: Rheumatoid arthritis with rheumatoid factor (HCC) No date: Sleep apnea No date: Weight loss  Reproductive/Obstetrics                              Anesthesia Physical Anesthesia Plan  ASA: 4  Anesthesia Plan: General   Post-op Pain Management: Ofirmev IV (intra-op)*   Induction: Intravenous  PONV Risk Score and Plan: 2 and Ondansetron, Dexamethasone and Treatment may vary due to age or medical condition  Airway Management Planned: Oral ETT and Video Laryngoscope Planned  Additional Equipment: Arterial line  Intra-op Plan:   Post-operative Plan: Extubation in OR  Informed Consent: I have reviewed the patients History and Physical, chart, labs and discussed the procedure including the risks, benefits and alternatives for the proposed anesthesia with the patient or authorized representative who has indicated his/her understanding and acceptance.     Dental advisory given  Plan Discussed with: CRNA and Surgeon  Anesthesia Plan Comments: (Discussed risks of anesthesia with patient, including PONV, sore throat, lip/dental/eye  damage. Rare risks discussed as well, such as cardiorespiratory and neurological sequelae, and allergic reactions. Discussed the role of CRNA in patient's perioperative care. Patient understands. Discussed possible arterial line. Patient counseled on being higher risk for anesthesia due  to comorbidities: acute stroke, CHF, recent pneumonia. Patient was told about increased risk of cardiac and respiratory events, including death. )         Anesthesia Quick Evaluation

## 2023-12-03 NOTE — TOC Initial Note (Signed)
 Transition of Care [facility]) - Initial/Assessment Note    Patient Details  Name: Jose Cuevas MRN: [id number] Date of Birth: 1960-11-23  Transition of Care [facility]) CM/SW Contact:    Erin Sons, LCSW Phone Number: 12/03/2023, 2:10 PM  Clinical Narrative:                  CSW met with pt, pt's two sisters, and niece bedside. Discussed PT rec for SNF. Pt is agreeable to SNF w/u. Sisters state that pt's wife was interested in [facility]. Fl2 completed and bed requests sent in hub.   Expected Discharge Plan: Skilled Nursing Facility Barriers to Discharge: Continued Medical Work up, SNF Pending bed offer         Expected Discharge Plan and Services       Living arrangements for the past 2 months: Single Family Home                                      Prior Living Arrangements/Services Living arrangements for the past 2 months: Single Family Home Lives with:: Self Patient language and need for interpreter reviewed:: Yes        Need for Family Participation in Patient Care: Yes (Comment) Care giver support system in place?: Yes (comment)   Criminal Activity/Legal Involvement Pertinent to Current Situation/Hospitalization: No - Comment as needed  Activities of Daily Living   ADL Screening (condition at time of admission) Independently performs ADLs?: Yes (appropriate for developmental age) Is the patient deaf or have difficulty hearing?: Yes Does the patient have difficulty seeing, even when wearing glasses/contacts?: No Does the patient have difficulty concentrating, remembering, or making decisions?: No  Permission Sought/Granted                  Emotional Assessment Appearance:: Appears stated age Attitude/Demeanor/Rapport: Lethargic Affect (typically observed): Accepting Orientation: : Oriented to Self, Oriented to Place, Oriented to  Time, Oriented to Situation Alcohol / Substance Use: Not Applicable Psych Involvement: No (comment)  Admission  diagnosis:  Sepsis due to pneumonia (HCC) [J18.9, A41.9] Syncope, unspecified syncope type [R55] Pneumonia of right lower lobe due to infectious organism [J18.9] Sepsis with acute hypoxic respiratory failure without septic shock, due to unspecified organism (HCC) [A41.9, R65.20, J96.01] Patient Active Problem List   Diagnosis Date Noted   New onset seizure (HCC) 11/27/2023   IBS (irritable bowel syndrome) 11/27/2023   AKI (acute kidney injury) (HCC) 11/27/2023   Acute respiratory failure with hypoxia (HCC) 11/27/2023   Dyslipidemia 11/27/2023   Anxiety and depression 11/27/2023   GERD without esophagitis 11/27/2023   Hypokalemia 11/27/2023   Sepsis due to pneumonia (HCC) 11/26/2023   Left carotid artery stenosis 08/30/2023   Other insomnia 08/30/2023   Moderate episode of recurrent major depressive disorder (HCC) 08/30/2023   Centrilobular emphysema (HCC) 08/12/2023   Essential hypertension 03/12/2021   Chronic fatigue and malaise 03/12/2021   Tobacco dependence with current use 03/12/2021   B12 deficiency 03/12/2021   Vitamin D deficiency 03/12/2021   Multiple pulmonary nodules determined by computed tomography of lung 05/28/2020   Atrophic kidney, acquired 05/28/2020   Renal calculus, left 05/28/2020   Aortic atherosclerosis (HCC) 05/28/2020   Poison oak dermatitis 05/28/2020   Encounter for general adult medical examination with abnormal findings 04/22/2018   Tick bite of abdominal wall 04/22/2018   Candida rash of groin 04/22/2018   PAD (peripheral artery disease) (HCC)  01/30/2018   Back pain 04/21/2014   Rheumatoid arthritis with rheumatoid factor (HCC) 04/21/2014   PCP:  Sallyanne Kuster, NP Pharmacy:   [id number] - [place], [place] - [address] [place] [address] Phone: [phone] Fax: [phone]     Social Drivers of Health (SDOH) Social History: SDOH Screenings   Food Insecurity: No Food Insecurity (11/27/2023)  Housing: Low Risk   (11/27/2023)  Transportation Needs: No Transportation Needs (11/27/2023)  Utilities: Not At Risk (11/27/2023)  Alcohol Screen: Low Risk  (03/23/2022)  Depression (PHQ2-9): Low Risk  (07/11/2023)  Social Connections: Socially Isolated (11/27/2023)  Tobacco Use: High Risk (12/01/2023)   SDOH Interventions:     Readmission Risk Interventions     No data to display

## 2023-12-03 NOTE — Progress Notes (Addendum)
 [facility] Progress Note  Patient: Jose Cuevas    NWG:[id number]  DOA: 11/26/2023     Date of Service: the patient was seen and examined on 12/03/2023  Chief Complaint  Patient presents with   unresponsive   Brief hospital course: Jose Cuevas is a 63 y.o. Caucasian male with medical history significant for hypertension, dyslipidemia, gastroparesis, rheumatoid arthritis, OSA and DDD, who presented to the emergency room with acute onset of tonic-clonic seizure witnessed by his family with postictal confusion and lack of memory about the event.  The patient later had nausea and vomiting.  Did not any fever or chills.  No chest pain or palpitations.  He had right shoulder pain when he fell during his seizure.  The patient denies any significant cough or wheezing or dyspnea.  No paresthesias or focal muscle weakness.  He had no urinary or stool incontinence.  No tongue bites.  No dysuria, oliguria or hematuria or flank pain.   ED Course: When the patient came to the ER, BP was 151/84 with pulse oximetry of 98% on 4 L of O2 by nasal cannula and later respiratory rate was 30 and otherwise normal vital signs.  Labs revealed hypokalemia 3.1 and creatinine 1.33 above previous levels.  High-sensitivity troponin I was 54 and later 125.  Lactic acid was 4.4 and later 2.5.  CBC showed leukocytosis 21.6 with neutrophilia. EKG as reviewed by me : EKG showed sinus rhythm with rate of 92 and left bundle branch block. Imaging: Right shoulder x-ray showed osteoarthritis with no acute displaced fracture.  It showed emphysema with stable right pleural thickening versus small pleural effusion.  Portable chest x-ray showed no acute cardiopulmonary disease. Chest CTA revealed the following: 1. No evidence of significant pulmonary embolus. 2. Cardiac enlargement with left ventricular hypertrophy. 3. Probable loculated collection in the right pleural space, possibly related to infection. Infiltrates in the left 4. Small  left pleural effusion with basilar atelectasis. 5. Diffuse emphysematous changes throughout the lungs with subpleural fibrosis. 6. Multiple pulmonary nodules. Most significant: Right solid pulmonary nodule within the upper lobe measuring 7 mm. Per Fleischner Society Guidelines, recommend a non-contrast Chest CT at 3-6 months, then another non-contrast Chest CT at 18-24 months. 7. Hilar and mediastinal lymphadenopathy is nonspecific and could represent reactive change versus lymphoproliferative process. Similar appearance to previous study. 8. 4.2 cm diameter ascending thoracic aortic aneurysm. Recommend annual imaging followup by CTA or MRA. 9. Aortic atherosclerosis.   Noncontrasted head CT scan revealed no acute intracranial normalities.  It showed bilateral mastoid effusions.  The patient was given IV Rocephin and Zithromax, p.o. ibuprofen, 4 mg of IV Zofran, 40 mill Cabbell of p.o. potassium chloride and 1 L bolus of IV normal saline.  He will be admitted to a medical telemetry bed for further evaluation and management.   Assessment and Plan:  # Acute left MCA infarct MRI: Positive for several punctate acute infarcts scattered in the Left MCA territory. Associated hemorrhage or mass effect. DAPT x 90 days f/b ASA 81 mg pod LDL 47, below goal 70, TSH 1.8 within normal range Held statin due to elevated CK level, but he does not need a statin as per neurology because LDL is below goal. Continue telemetry Continue NIH stroke scale as per protocol Follow PT OT and SLP TTE LVEF 35 to 40%, no WMA, moderate MR and AR Follow neurology for further recommendation 2/28 held Plavix due to right shoulder surgery planned on Tuesday, 12/03/2023   #  Right shoulder dislocation and possible fracture?  S/p fall and seizures Hold Plavix for now, continue aspirin Pain meds as needed Discussed with orthopedic surgery, patient is scheduled for reverse shoulder arthroplasty today. Pt is .p.o. since  midnight   # Sepsis due to pneumonia  # Acute respiratory failure with hypoxia due to PNA, Resolved Sepsis manifested by leukocytosis and tachypnea. S/p IV Rocephin x 5 days and Zithromax x 3 days, completed course Continue mucolytic therapy  and DuoNeb p.r.n. Blood cultures NGTD S/p IVF, and s/p NS 50 mL/h 2/28, fever Tmax 101. blood cultures NGTD, s/p Antibiotics Negative COVID flu and RSV Supplemental O2 inhalation weaned off, currently saturating well on room air   New onset seizure most likely due to acute CVA Continue Keppra 500 twice daily as per neuro - Continue seizure precautions.  EEG negative - Neurology consulted, recommended MRI brain and C-spine.  MRI positive for acute CVA -Continue prn IV Ativan for now.    Elevated troponin, most likely demand ischemia Trop peaked 174 Patient denies any chest pain TTE LVEF 35 to 40%, no WMA, moderate MR and AR Systolic CHF, unknown acuity, low EF found on echocardiogram.  Currently euvolemic, no exacerbation noticed.   Rhabdomyolysis most likely due to seizures.  Resolved CK 1700--625--318 S/p IV fluid, continue gentle hydration and encourage oral intake  Hypokalemia, potassium repleted Hypomagnesemia, mag repleted. Monitor electrolytes and replete as needed.    AKI (acute kidney injury) resolved Renal functions improved after IV fluid Monitor renal functions daily Discontinued Lasix and hydrochlorothiazide for now Metabolic acidosis, started oral bicarbonate. Monitor BMP daily    Centrilobular emphysema (HCC) - The patient be placed on scheduled and as needed DuoNebs. - Mucolytic therapy will be provided.   GERD without esophagitis - We will continue PPI therapy.   Anxiety and depression - We will continue Klonopin, Remeron and Cymbalta.   Dyslipidemia - Hold the statin for now due to elevated CK level   IBS (irritable bowel syndrome) - continue Amitiza.   Essential hypertension Started metoprolol 25  mg p.o. twice daily Started losartan 25 mg p.o. daily Discontinued Lasix for now Monitor BP and titrate medications accordingly  Rheumatoid arthritis with rheumatoid factor (HCC) -continue methotrexate.   Pulmonary nodules, incidental finding CT scan: Right solid pulmonary nodule within the upper lobe measuring 7 mm. Per Fleischner Society Guidelines, recommend a non-contrast Chest CT at 3-6 months, then another non-contrast Chest CT at 18-24 months. Recommended to follow with PCP and pulmonary as an outpatient  Ascending aortic aneurysm, incidental finding CT scan: 4.2 cm diameter ascending thoracic aortic aneurysm. Recommend annual imaging followup by CTA or MRA. 9. Aortic atherosclerosis. Follow with PCP as an outpatient  Vitamin D insufficiency, started vitamin D 50,000 units p.o. weekly.  Body mass index is 22.15 kg/m.  Interventions:  Diet: Heart healthy diet DVT Prophylaxis: Subcutaneous Lovenox   Advance goals of care discussion: Full code  Family Communication: family was present at bedside, at the time of interview.  The pt provided permission to discuss medical plan with the family. Opportunity was given to ask question and all questions were answered satisfactorily.   Disposition:  Pt is from Home, admitted with Acute CVA, seizures, respiratory failure, pneumonia, right shoulder dislocation, which precludes a safe discharge. Scheduled for right shoulder surgery on 2/4 Discharge to SNF, TBD after PT/OT eval, when stable.  Subjective: No significant overnight event, patient was sleepy but he woke up and said that right arm still hurts on movement, no  pain at rest.  Patient is aware that he is going for surgery in the afternoon.  Denied any other questions.  Physical Exam: General: NAD, looked tired Appear in no distress, affect depressed Eyes: PERRLA   ENT: Oral Mucosa Clear, moist  Neck: no JVD,  Cardiovascular: S1 and S2 Present, no Murmur,  Respiratory:  Equal air entry bilaterally, mild crackles, no wheezes.   Abdomen: Bowel Sound present, Soft and no tenderness,  Skin: no rashes Extremities: no Pedal edema, no calf tenderness Neurologic: CN grossly intact, RUE power 1-2/5, RLE power 3/5, left side intact Gait not checked due to patient safety concerns  Vitals:   12/03/23 0056 12/03/23 0409 12/03/23 0941 12/03/23 1246  BP: 118/66 129/68 (!) 146/75 (!) 140/76  Pulse: 74 75 78 82  Resp: 18 18 16 16   Temp: 98.6 F (37 C) 98.6 F (37 C) 97.6 F (36.4 C) 98.5 F (36.9 C)  TempSrc: Oral Oral    SpO2: 93% 95% 97% 97%  Weight:      Height:        Intake/Output Summary (Last 24 hours) at 12/03/2023 1343 Last data filed at 12/03/2023 0409 Gross per 24 hour  Intake --  Output 500 ml  Net -500 ml   Filed Weights   11/26/23 1716  Weight: 68 kg    Data Reviewed: I have personally reviewed and interpreted daily labs, tele strips, imagings as discussed above. I reviewed all nursing notes, pharmacy notes, vitals, pertinent old records I have discussed plan of care as described above with RN and patient/family.  CBC: Recent Labs  Lab 11/26/23 1709 11/27/23 0620 11/29/23 0456 11/30/23 0518 12/01/23 0216 12/02/23 0230 12/03/23 0434  WBC 21.6*   < > 8.9 9.2 10.9* 8.5 9.1  NEUTROABS 12.5*  --  5.9  --   --   --   --   HGB 14.5   < > 11.4* 11.7* 11.5* 11.6* 11.5*  HCT 44.6   < > 34.7* 35.7* 34.8* 35.1* 34.3*  MCV 86.4   < > 87.0 85.8 87.0 86.9 86.4  PLT 307   < > 176 226 256 265 294   < > = values in this interval not displayed.   Basic Metabolic Panel: Recent Labs  Lab 11/28/23 0559 11/29/23 0409 11/29/23 0456 11/30/23 0518 12/01/23 0216 12/02/23 0230 12/03/23 0434  NA 133*  --  136 140 138 137 138  K 4.3  --  4.1 3.9 3.4* 3.6 3.6  CL 105  --  109 111 109 108 107  CO2 19*  --  20* 19* 20* 20* 22  GLUCOSE 122*  --  113* 82 123* 113* 99  BUN 12  --  13 14 17 15 14   CREATININE 1.15  --  1.10 0.91 0.95 0.93 0.89  CALCIUM  8.2*  --  8.3* 8.6* 8.6* 8.6* 8.9  MG 2.1 1.9  --  1.9 1.8 2.0  --   PHOS 3.2 3.3  --  3.3 2.9 3.6  --     Studies: No results found.   Scheduled Meds:  aspirin EC  81 mg Oral Daily   [START ON 12/04/2023] clopidogrel  75 mg Oral Daily   cyanocobalamin  1,000 mcg Oral Daily   DULoxetine  30 mg Oral QHS   enoxaparin (LOVENOX) injection  40 mg Subcutaneous Q24H   folic acid  1 mg Oral Daily   guaiFENesin  600 mg Oral BID   hydroxychloroquine  200 mg Oral 2 times per  day on Monday Tuesday Wednesday Thursday Friday   hydroxychloroquine  200 mg Oral Once per day on Sunday Saturday   levETIRAcetam  500 mg Oral BID   lubiprostone  24 mcg Oral BID   magnesium oxide  400 mg Oral BID   metoCLOPramide  10 mg Oral TID AC   metoprolol tartrate  25 mg Oral BID   mirtazapine  15 mg Oral QHS   pantoprazole  40 mg Oral BID   tamsulosin  0.4 mg Oral Daily   traZODone  50 mg Oral QHS   Vitamin D (Ergocalciferol)  50,000 Units Oral Q7 days   Continuous Infusions:   ceFAZolin (ANCEF) IV      PRN Meds: acetaminophen **OR** acetaminophen, albuterol, chlorpheniramine-HYDROcodone, LORazepam, magnesium hydroxide, morphine injection, ondansetron **OR** ondansetron (ZOFRAN) IV, oxyCODONE, polyvinyl alcohol  Time spent: 35 minutes  Author: Gillis Santa. MD [facility] 12/03/2023 1:43 PM  To reach On-call, see care teams to locate the attending and reach out to them via [email or web address]. If 7PM-7AM, please contact night-coverage If you still have difficulty reaching the attending provider, please page the [facility] (Director on Call) for [facility] on amion for assistance.

## 2023-12-03 NOTE — Anesthesia Procedure Notes (Signed)
 Procedure Name: Intubation Date/Time: 12/03/2023 4:03 PM  Performed by: Maryla Morrow., CRNAPre-anesthesia Checklist: Patient identified, Patient being monitored, Timeout performed, Emergency Drugs available and Suction available Patient Re-evaluated:Patient Re-evaluated prior to induction Oxygen Delivery Method: Circle system utilized Preoxygenation: Pre-oxygenation with 100% oxygen Induction Type: IV induction Ventilation: Mask ventilation without difficulty Laryngoscope Size: McGrath and 4 Grade View: Grade I Tube type: Oral Tube size: 7.5 mm Number of attempts: 1 Airway Equipment and Method: Stylet Placement Confirmation: ETT inserted through vocal cords under direct vision, positive ETCO2 and breath sounds checked- equal and bilateral Secured at: 20 cm Tube secured with: Tape Dental Injury: Teeth and Oropharynx as per pre-operative assessment

## 2023-12-03 NOTE — [place] FL2 (Signed)
 [place] MEDICAID FL2 LEVEL OF CARE FORM     IDENTIFICATION  Patient Name: Jose Cuevas Birthdate: Nov 19, 1960 Sex: male Admission Date (Current Location): 11/26/2023  [facility] and [place] Number:  Chiropodist and Address:  [facility], [address], [place], [place] [address]      Provider Number: [id number]  Attending Physician Name and Address:  Gillis Santa, MD  Relative Name and Phone Number:       Current Level of Care: Hospital Recommended Level of Care: Skilled Nursing Facility Prior Approval Number:    Date Approved/Denied:   PASRR Number: [id number]  Discharge Plan: SNF    Current Diagnoses: Patient Active Problem List   Diagnosis Date Noted   New onset seizure (HCC) 11/27/2023   IBS (irritable bowel syndrome) 11/27/2023   AKI (acute kidney injury) (HCC) 11/27/2023   Acute respiratory failure with hypoxia (HCC) 11/27/2023   Dyslipidemia 11/27/2023   Anxiety and depression 11/27/2023   GERD without esophagitis 11/27/2023   Hypokalemia 11/27/2023   Sepsis due to pneumonia (HCC) 11/26/2023   Left carotid artery stenosis 08/30/2023   Other insomnia 08/30/2023   Moderate episode of recurrent major depressive disorder (HCC) 08/30/2023   Centrilobular emphysema (HCC) 08/12/2023   Essential hypertension 03/12/2021   Chronic fatigue and malaise 03/12/2021   Tobacco dependence with current use 03/12/2021   B12 deficiency 03/12/2021   Vitamin D deficiency 03/12/2021   Multiple pulmonary nodules determined by computed tomography of lung 05/28/2020   Atrophic kidney, acquired 05/28/2020   Renal calculus, left 05/28/2020   Aortic atherosclerosis (HCC) 05/28/2020   Poison oak dermatitis 05/28/2020   Encounter for general adult medical examination with abnormal findings 04/22/2018   Tick bite of abdominal wall 04/22/2018   Candida rash of groin 04/22/2018   PAD (peripheral artery disease) (HCC) 01/30/2018   Back pain  04/21/2014   Rheumatoid arthritis with rheumatoid factor (HCC) 04/21/2014    Orientation RESPIRATION BLADDER Height & Weight     Self, Time, Situation, Place  Normal Incontinent, External catheter Weight: 150 lb (68 kg) Height:  5\' 9"  (175.3 cm)  BEHAVIORAL SYMPTOMS/MOOD NEUROLOGICAL BOWEL NUTRITION STATUS      Incontinent Diet (see d/c summary)  AMBULATORY STATUS COMMUNICATION OF NEEDS Skin   Extensive Assist Verbally Normal                       Personal Care Assistance Level of Assistance  Bathing, Dressing, Feeding Bathing Assistance: Maximum assistance Feeding assistance: Independent Dressing Assistance: Maximum assistance     Functional Limitations Info  Sight, Hearing, Speech Sight Info: Adequate Hearing Info: Impaired Speech Info: Adequate    SPECIAL CARE FACTORS FREQUENCY  OT (By licensed OT), PT (By licensed PT)     PT Frequency: 5x/week OT Frequency: 5x/week            Contractures Contractures Info: Not present    Additional Factors Info  Code Status, Allergies Code Status Info: full code Allergies Info: no known allergies           Current Medications (12/03/2023):  This is the current hospital active medication list Current Facility-Administered Medications  Medication Dose Route Frequency Provider Last Rate Last Admin   [MAR Hold] acetaminophen (TYLENOL) tablet 650 mg  650 mg Oral Q6H PRN Mansy, Jan A, MD   650 mg at 11/30/23 0915   Or   [MAR Hold] acetaminophen (TYLENOL) suppository 650 mg  650 mg Rectal Q6H PRN Mansy, Jan A,  MD       [MAR Hold] albuterol (PROVENTIL) (2.5 MG/3ML) 0.083% nebulizer solution 2.5 mg  2.5 mg Inhalation Q6H PRN Mansy, Vernetta Honey, MD       [MAR Hold] aspirin EC tablet 81 mg  81 mg Oral Daily Gillis Santa, MD   81 mg at 12/02/23 0912   ceFAZolin (ANCEF) IVPB 2g/100 mL premix  2 g Intravenous Once Signa Kell, MD       [facility] Hold] chlorpheniramine-HYDROcodone (TUSSIONEX) 10-8 MG/5ML suspension 5 mL  5 mL Oral Q12H PRN  Mansy, Vernetta Honey, MD       [MAR Hold] clopidogrel (PLAVIX) tablet 75 mg  75 mg Oral Daily Gillis Santa, MD       [facility] Hold] cyanocobalamin (VITAMIN B12) tablet 1,000 mcg  1,000 mcg Oral Daily Mansy, Jan A, MD   1,000 mcg at 12/02/23 0912   [MAR Hold] DULoxetine (CYMBALTA) DR capsule 30 mg  30 mg Oral QHS Mansy, Jan A, MD   30 mg at 12/02/23 2124   [facility] Hold] enoxaparin (LOVENOX) injection 40 mg  40 mg Subcutaneous Q24H Mansy, Jan A, MD   40 mg at 12/02/23 2127   [facility] Hold] folic acid (FOLVITE) tablet 1 mg  1 mg Oral Daily Mansy, Jan A, MD   1 mg at 12/02/23 0912   [MAR Hold] guaiFENesin (MUCINEX) 12 hr tablet 600 mg  600 mg Oral BID Mansy, Jan A, MD   600 mg at 12/02/23 2123   [facility] Hold] hydroxychloroquine (PLAQUENIL) tablet 200 mg  200 mg Oral 2 times per day on Monday Tuesday Wednesday Thursday Friday Mansy, Jan A, MD   200 mg at 12/02/23 2130   [facility] Hold] hydroxychloroquine (PLAQUENIL) tablet 200 mg  200 mg Oral Once per day on Sunday Saturday Mansy, Jan A, MD   200 mg at 12/01/23 1610   [facility] Hold] levETIRAcetam (KEPPRA) tablet 500 mg  500 mg Oral BID Jefferson Fuel, MD   500 mg at 12/02/23 2124   [facility] Hold] LORazepam (ATIVAN) injection 1 mg  1 mg Intravenous Q1H PRN Mansy, Vernetta Honey, MD       [MAR Hold] losartan (COZAAR) tablet 25 mg  25 mg Oral Daily Gillis Santa, MD       [facility] Hold] lubiprostone (AMITIZA) capsule 24 mcg  24 mcg Oral BID Mansy, Jan A, MD   24 mcg at 12/02/23 2207   [facility] Hold] magnesium hydroxide (MILK OF MAGNESIA) suspension 30 mL  30 mL Oral Daily PRN Mansy, Jan A, MD   30 mL at 12/01/23 0850   [MAR Hold] magnesium oxide (MAG-OX) tablet 400 mg  400 mg Oral BID Gillis Santa, MD   400 mg at 12/02/23 2124   [MAR Hold] metoCLOPramide (REGLAN) tablet 10 mg  10 mg Oral TID [facility] Mansy, Jan A, MD   10 mg at 12/02/23 1155   [MAR Hold] metoprolol tartrate (LOPRESSOR) tablet 25 mg  25 mg Oral BID Gillis Santa, MD   25 mg at 12/02/23 2124   [MAR Hold] mirtazapine (REMERON) tablet 15 mg  15 mg  Oral QHS Mansy, Jan A, MD   15 mg at 12/02/23 2207   [facility] Hold] morphine (PF) 2 MG/ML injection 2 mg  2 mg Intravenous Q4H PRN Gillis Santa, MD   2 mg at 11/27/23 2355   [MAR Hold] ondansetron (ZOFRAN) tablet 4 mg  4 mg Oral Q6H PRN Mansy, Vernetta Honey, MD       Or   [facility] Hold] ondansetron (  ZOFRAN) injection 4 mg  4 mg Intravenous Q6H PRN Mansy, Vernetta Honey, MD       [facility] Hold] oxyCODONE (Oxy IR/ROXICODONE) immediate release tablet 10 mg  10 mg Oral QID PRN Mansy, Jan A, MD   10 mg at 12/02/23 1155   [MAR Hold] pantoprazole (PROTONIX) EC tablet 40 mg  40 mg Oral BID Mansy, Jan A, MD   40 mg at 12/02/23 2124   [facility] Hold] polyvinyl alcohol (LIQUIFILM TEARS) 1.4 % ophthalmic solution 1 drop  1 drop Both Eyes PRN Gillis Santa, MD   1 drop at 11/29/23 1702   [MAR Hold] tamsulosin (FLOMAX) capsule 0.4 mg  0.4 mg Oral Daily Mansy, Jan A, MD   0.4 mg at 12/02/23 0913   [MAR Hold] traZODone (DESYREL) tablet 50 mg  50 mg Oral QHS Gillis Santa, MD   50 mg at 12/02/23 2123   [MAR Hold] Vitamin D (Ergocalciferol) (DRISDOL) 1.25 MG (50000 UNIT) capsule 50,000 Units  50,000 Units Oral Q7 days Gillis Santa, MD   50,000 Units at 11/29/23 1478     Discharge Medications: Please see discharge summary for a list of discharge medications.  Relevant Imaging Results:  Relevant Lab Results:   Additional Information SSN [id number] [place], [place]

## 2023-12-04 ENCOUNTER — Other Ambulatory Visit: Payer: Self-pay | Admitting: Nurse Practitioner

## 2023-12-04 DIAGNOSIS — Z79899 Other long term (current) drug therapy: Secondary | ICD-10-CM

## 2023-12-04 DIAGNOSIS — J189 Pneumonia, unspecified organism: Secondary | ICD-10-CM | POA: Diagnosis not present

## 2023-12-04 DIAGNOSIS — A419 Sepsis, unspecified organism: Secondary | ICD-10-CM | POA: Diagnosis not present

## 2023-12-04 LAB — CBC
HCT: 33 % — ABNORMAL LOW (ref 39.0–52.0)
Hemoglobin: 11.1 g/dL — ABNORMAL LOW (ref 13.0–17.0)
MCH: 28.7 pg (ref 26.0–34.0)
MCHC: 33.6 g/dL (ref 30.0–36.0)
MCV: 85.3 fL (ref 80.0–100.0)
Platelets: 327 10*3/uL (ref 150–400)
RBC: 3.87 MIL/uL — ABNORMAL LOW (ref 4.22–5.81)
RDW: 14.9 % (ref 11.5–15.5)
WBC: 11.6 10*3/uL — ABNORMAL HIGH (ref 4.0–10.5)
nRBC: 0 % (ref 0.0–0.2)

## 2023-12-04 LAB — BASIC METABOLIC PANEL
Anion gap: 10 (ref 5–15)
BUN: 19 mg/dL (ref 8–23)
CO2: 20 mmol/L — ABNORMAL LOW (ref 22–32)
Calcium: 8.8 mg/dL — ABNORMAL LOW (ref 8.9–10.3)
Chloride: 108 mmol/L (ref 98–111)
Creatinine, Ser: 0.83 mg/dL (ref 0.61–1.24)
GFR, Estimated: >60 mL/min (ref >60)
Glucose, Bld: 122 mg/dL — ABNORMAL HIGH (ref 70–99)
Potassium: 4.4 mmol/L (ref 3.5–5.1)
Sodium: 138 mmol/L (ref 135–145)

## 2023-12-04 LAB — CULTURE, BLOOD (ROUTINE X 2)
Culture: NO GROWTH
Culture: NO GROWTH
Special Requests: ADEQUATE
Special Requests: ADEQUATE

## 2023-12-04 MED ORDER — ASPIRIN 325 MG PO TBEC
325.0000 mg | DELAYED_RELEASE_TABLET | Freq: Every day | ORAL | Status: DC
  Administered 2023-12-04 – 2023-12-05 (×2): 325 mg via ORAL
  Filled 2023-12-04 (×2): qty 1

## 2023-12-04 MED ORDER — OXYCODONE HCL 5 MG PO TABS
5.0000 mg | ORAL_TABLET | ORAL | Status: DC | PRN
  Administered 2023-12-04: 10 mg via ORAL
  Administered 2023-12-05: 5 mg via ORAL
  Filled 2023-12-04: qty 1
  Filled 2023-12-04: qty 2

## 2023-12-04 MED ORDER — SENNOSIDES-DOCUSATE SODIUM 8.6-50 MG PO TABS: 1.0000 | ORAL_TABLET | Freq: Every evening | ORAL | Status: DC | PRN

## 2023-12-04 MED ORDER — PHENOL 1.4 % MT LIQD: 1.0000 | OROMUCOSAL | Status: DC | PRN

## 2023-12-04 MED ORDER — ACETAMINOPHEN 325 MG PO TABS
325.0000 mg | ORAL_TABLET | Freq: Four times a day (QID) | ORAL | Status: DC | PRN
  Filled 2023-12-04: qty 2

## 2023-12-04 MED ORDER — METOCLOPRAMIDE HCL 5 MG PO TABS
5.0000 mg | ORAL_TABLET | Freq: Three times a day (TID) | ORAL | Status: DC | PRN
  Filled 2023-12-04: qty 2

## 2023-12-04 MED ORDER — ACETAMINOPHEN 500 MG PO TABS
1000.0000 mg | ORAL_TABLET | Freq: Three times a day (TID) | ORAL | Status: AC
  Administered 2023-12-04 – 2023-12-05 (×4): 1000 mg via ORAL
  Filled 2023-12-04 (×4): qty 2

## 2023-12-04 MED ORDER — OXYCODONE HCL 5 MG PO TABS
5.0000 mg | ORAL_TABLET | ORAL | 0 refills | Status: AC | PRN
Start: 2023-12-04 — End: ?

## 2023-12-04 MED ORDER — ASPIRIN 325 MG PO TBEC: 325.0000 mg | DELAYED_RELEASE_TABLET | Freq: Every day | ORAL | Status: AC

## 2023-12-04 MED ORDER — HYDROMORPHONE HCL 1 MG/ML IJ SOLN: 0.2000 mg | INTRAMUSCULAR | Status: DC | PRN

## 2023-12-04 MED ORDER — ONDANSETRON HCL 4 MG PO TABS: 4.0000 mg | ORAL_TABLET | Freq: Four times a day (QID) | ORAL | Status: DC | PRN

## 2023-12-04 MED ORDER — ONDANSETRON HCL 4 MG/2ML IJ SOLN: 4.0000 mg | Freq: Four times a day (QID) | INTRAMUSCULAR | Status: DC | PRN

## 2023-12-04 MED ORDER — ALUM & MAG HYDROXIDE-SIMETH 200-200-20 MG/5ML PO SUSP: 30.0000 mL | ORAL | Status: DC | PRN

## 2023-12-04 MED ORDER — OXYCODONE HCL 5 MG PO TABS
10.0000 mg | ORAL_TABLET | ORAL | Status: DC | PRN
  Filled 2023-12-04: qty 2

## 2023-12-04 MED ORDER — DOCUSATE SODIUM 100 MG PO CAPS
100.0000 mg | ORAL_CAPSULE | Freq: Two times a day (BID) | ORAL | Status: DC
  Administered 2023-12-04 – 2023-12-05 (×3): 100 mg via ORAL
  Filled 2023-12-04 (×3): qty 1

## 2023-12-04 MED ORDER — ASPIRIN 325 MG PO TBEC: 325.0000 mg | DELAYED_RELEASE_TABLET | Freq: Every day | ORAL | Status: DC

## 2023-12-04 MED ORDER — BISACODYL 10 MG RE SUPP: 10.0000 mg | Freq: Every day | RECTAL | Status: DC | PRN

## 2023-12-04 MED ORDER — CEFAZOLIN SODIUM-DEXTROSE 2-4 GM/100ML-% IV SOLN
2.0000 g | Freq: Four times a day (QID) | INTRAVENOUS | Status: AC
  Administered 2023-12-04 (×3): 2 g via INTRAVENOUS
  Filled 2023-12-04 (×3): qty 100

## 2023-12-04 MED ORDER — MENTHOL 3 MG MT LOZG: 1.0000 | LOZENGE | OROMUCOSAL | Status: DC | PRN

## 2023-12-04 MED ORDER — METOCLOPRAMIDE HCL 5 MG/ML IJ SOLN: 5.0000 mg | Freq: Three times a day (TID) | INTRAMUSCULAR | Status: DC | PRN

## 2023-12-04 MED ORDER — CLOPIDOGREL BISULFATE 75 MG PO TABS
75.0000 mg | ORAL_TABLET | Freq: Every day | ORAL | Status: DC
  Administered 2023-12-04 – 2023-12-05 (×2): 75 mg via ORAL
  Filled 2023-12-04 (×2): qty 1

## 2023-12-04 NOTE — Anesthesia Postprocedure Evaluation (Signed)
 Anesthesia Post Note  Patient: Jose Cuevas  Procedure(s) Performed: REVERSE SHOULDER ARTHROPLASTY (Right: Shoulder)  Patient location during evaluation: PACU Anesthesia Type: General Level of consciousness: awake and alert Pain management: pain level controlled Vital Signs Assessment: post-procedure vital signs reviewed and stable Respiratory status: spontaneous breathing, nonlabored ventilation, respiratory function stable and patient connected to nasal cannula oxygen Cardiovascular status: blood pressure returned to baseline and stable Postop Assessment: no apparent nausea or vomiting Anesthetic complications: no   No notable events documented.   Last Vitals:  Vitals:   12/03/23 2206 12/04/23 0105  BP: (!) 147/88 139/84  Pulse: 86 92  Resp: 20 20  Temp: 36.6 C (!) 35.7 C  SpO2: 99% 95%    Last Pain:  Vitals:   12/03/23 1926  TempSrc:   PainSc: 0-No pain                 Lenard Simmer

## 2023-12-04 NOTE — TOC Progression Note (Signed)
 Transition of Care [facility]) - Progression Note    Patient Details  Name: Jose Cuevas MRN: [id number] Date of Birth: 1961-02-11  [facility] CM/SW Contact  Erin Sons, [place] Phone Number: 12/04/2023, 4:16 PM  Clinical Narrative:     Pt/family have preference for [facility]. CSW contacted [facility] liaison to confirm bed offer; awaiting response.   Expected Discharge Plan: Skilled Nursing Facility Barriers to Discharge: Continued Medical Work up, SNF Pending bed offer  Expected Discharge Plan and Services       Living arrangements for the past 2 months: Single Family Home                                       Social Determinants of Health (SDOH) Interventions SDOH Screenings   Food Insecurity: No Food Insecurity (11/27/2023)  Housing: Low Risk  (11/27/2023)  Transportation Needs: No Transportation Needs (11/27/2023)  Utilities: Not At Risk (11/27/2023)  Alcohol Screen: Low Risk  (03/23/2022)  Depression (PHQ2-9): Low Risk  (07/11/2023)  Social Connections: Socially Isolated (11/27/2023)  Tobacco Use: High Risk (12/03/2023)    Readmission Risk Interventions     No data to display

## 2023-12-04 NOTE — Discharge Instructions (Signed)
Jose Albee, MD  [facility]  Phone: [phone]  Fax: [phone]   Discharge Instructions after Reverse Shoulder Replacement    1. Activity/Sling: You are to be non-weight bearing on operative extremity. A sling/shoulder immobilizer has been provided for you. Only remove the sling to perform elbow, wrist, and hand RoM exercises and hygiene/dressing. Active reaching and lifting are not permitted. You will be given further instructions on sling use at your first physical therapy visit and postoperative visit with Dr. Allena Katz.   2. Dressings: Dressing may be removed at 1st physical therapy visit (~3-4 days after surgery). Afterwards, you may either leave open to air (if no drainage) or cover with dry, sterile dressing. If you have steri-strips on your wound, please do not remove them. They will fall off on their own. You may shower 5 days after surgery. Please pat incision dry. Do not rub or place any shear forces across incision. If there is drainage or any opening of incision after 5 days, please notify our offices immediately.    3. Driving:  Plan on not driving for six weeks. Please note that you are advised NOT to drive while taking narcotic pain medications as you may be impaired and unsafe to drive.   4. Medications:  - You have been provided a prescription for narcotic pain medicine (usually oxycodone). After surgery, take 1-2 narcotic tablets every 4 hours if needed for severe pain. Please start this as soon as you begin to start having pain (if you received a nerve block, start taking as soon as this wears off).  - A prescription for anti-nausea medication will be provided in case the narcotic medicine causes nausea - take 1 tablet every 6 hours only if nauseated.  - Take enteric coated aspirin 325 mg once daily for 6 weeks to prevent blood clots. Do not take aspirin if you have an aspirin sensitivity/allergy or asthma or are on an anticoagulant (blood thinner) already. If so, then  your home anticoagulant will be resume and managed - do not take aspirin. -Take tylenol 1000mg  (2 Extra strength or 3 regular strength tablets) every 8 hours for pain. This will reduce the amount of narcotic medication needed. May stop tylenol when you are having minimal pain. - Take a stool softener (Colace, Dulcolax or Senakot) if you are using narcotic pain medications to help with constipation that is associated with narcotic use. - DO NOT take ANY nonsteroidal anti-inflammatory pain medications: Advil, Motrin, Ibuprofen, Aleve, Naproxen, or Naprosyn.   If you are taking prescription medication for anxiety, depression, insomnia, muscle spasm, chronic pain, or for attention deficit disorder you are advised that you are at a higher risk of adverse effects with use of narcotics post-op, including narcotic addiction/dependence, depressed breathing, death. If you use non-prescribed substances: alcohol, marijuana, cocaine, heroin, methamphetamines, etc., you are at a higher risk of adverse effects with use of narcotics post-op, including narcotic addiction/dependence, depressed breathing, death. You are advised that taking > 50 morphine milligram equivalents (MME) of narcotic pain medication per day results in twice the risk of overdose or death. For your prescription provided: oxycodone 5 mg - taking more than 6 tablets per day after the first few days of surgery.   5. Physical Therapy: 1-2 times per week for ~12 weeks. Therapy typically starts on post operative Day 3 or 4. You have been provided an order for physical therapy. The therapist will provide home exercises. Please contact our offices if this appointment has not been scheduled.  6. Work: May do light duty/desk job in approximately 2 weeks when off of narcotics, pain is well-controlled, and swelling has decreased if able to function with one arm in sling. Full work may take 6 weeks if light motions and function of both arms is required.  Lifting jobs may require 12 weeks.   7. Post-Op Appointments: Your first post-op appointment will be with Dr. Allena Katz in approximately 2 weeks time.    If you find that they have not been scheduled please call the Orthopaedic Appointment front desk at [phone].                               Jose Albee, MD [facility] Phone: [phone] Fax: [phone]   REVERSE SHOULDER ARTHROPLASTY REHAB GUIDELINES   These guidelines should be tailored to individual patients based on their rehab goals, age, precautions, quality of repair, etc.  Progression should be based on patient progress and approval by the referring physician.  PHASE 1 - Day 1 through Week 2  GENERAL GUIDELINES AND PRECAUTIONS Sling wear 24/7 except during grooming and home exercises (3 to 5 times daily) Avoid shoulder extension such that the arm is posterior the frontal plane.  When patients recline, a pillow should be placed behind the upper arm and sling should be on.  They should be advised to always be able to see the elbow Avoid combined IR/ADD/EXT, such as hand behind back to prevent dislocation Avoid combined IR and ADD such as reaching across the chest to prevent dislocation No AROM No submersion in pool/water for 4 weeks No weight bearing through operative arm (as in transfers, walker use, etc.)  GOALS Maintain integrity of joint replacement; protect soft tissue healing Increase PROM for elevation to 120 and ER to 30 (will remain the goal for first 6 weeks) Optimize distal UE circulation and muscle activity (elbow, wrist and hand) Instruct in use of sling for proper fit, polar care device for ice application after HEP, signs/symptoms of infection  EXERCISES Active elbow, wrist and hand Passive forward elevation in scapular plane to 90-120 max motion; ER in scapular plane to 30 Active scapular retraction with arms resting in neutral position  CRITERIA TO PROGRESS TO  PHASE 2 Low pain (less than 3/10) with shoulder PROM Healing of incision without signs of infection Clearance by MD to advance after 2 week MD check up  PHASE 2 - 2 weeks - 6 weeks  GENERAL GUIDELINES AND PRECAUTIONS Sling may be removed while at home; worn in community without abduction pillow May use arm for light activities of daily living (such as feeding, brushing teeth, dressing.) with elbow near  the side of the body  and arm in front of the body- no active lifting of the arm May submerge in water (tub, pool, [place], etc.) after 4 weeks Continue to avoid WBing through the operative arm Continue to avoid combined IR/EXT/ADD (hand behind the back) and IR/ADD  (reaching across chest) for dislocation precautions  GOALS  Achieve passive elevation to 120 and ER to 30  Low (less than 3/10) to no pain  Ability to fire all heads of the deltoid  EXERCISES May discontinue grip, and active elbow and wrist exercises since using the arm in ADL's  with sling removed around the home Continue passive elevation to 120 and ER to 30, both in scapular plane with arm supported on table top Add submaximal isometrics, pain free effort, for all  functional heads of deltoid (anterior, posterior, middle)  Ensure that with posterior deltoid isometric the shoulder does not move into extension and the arm remains anterior the frontal plane At 4 weeks:  begin to place arm in balanced position of 90 deg elevation in supine; when patient able to hold this position with ease, may begin reverse pendulums clockwise and counterclockwise  CRITERIA TO PROGRESS TO PHASE 3 Passive forward elevation in scapular plane to 120; passive ER in scapular plane to 30 Ability to fire isometrically all heads of the deltoid muscle without pain Ability to place and hold the arm in balanced position (90 deg elevation in supine)  PHASE 3 - 6 weeks to 3 months  GENERAL GUIDELINES AND PRECAUTIONS Discontinue use of sling Avoid  forcing end range motion in any direction to prevent dislocation  May advance use of the arm actively in ADL's without being restricted to arm by the side of the body, however, avoid heavy lifting and sports (forever!) May initiate functional IR behind the back gently NO UPPER BODY ERGOMETER   GOALS Optimize PROM for elevation and ER in scapular plane with realistic expectation that max  mobility for elevation is usually around 145-160 passively; ER 40 to 50 passively; functional IR to L1 Recover AROM to approach as close to PROM available as possible; may expect 135-150 deg active elevation; 30 deg active ER; active functional IR to L1 Establish dynamic stability of the shoulder with deltoid and periscapular muscle gradual strengthening  EXERCISES Forward elevation in scapular plane active progression: supine to incline, to vertical; short to long lever arm Balanced position long lever arm AROM Active ER/IR with arm at side Scapular retraction with light band resistance Functional IR with hand slide up back - very gentle and gradual NO UPPER BODY ERGOMETER     CRITERIA TO PROGRESS TO PHASE 4  AROM equals/approaches PROM with good mechanics for elevation   No pain  Higher level demand on shoulder than ADL functions   PHASE 4 12 months and beyond  GENERAL GUIDELINES AND PRECAUTIONS No heavy lifting and no overhead sports No heavy pushing activity Gradually increase strength of deltoid and scapular stabilizers; also the rotator cuff if present with weights not to exceed 5 lbs NO UPPER BODY ERGOMETER   GOALS  Optimize functional use of the operative UE to meet the desired demands  Gradual increase in deltoid, scapular muscle, and rotator cuff strength  Pain free functional activities   EXERCISES Add light hand weights for deltoid up to and not to exceed 3 lbs for anterior and posterior with long arm lift against gravity; elbow bent to 90 deg for abduction in scapular  plane Theraband progression for extension to hip with scapular depression/retraction Theraband progression for serratus anterior punches in supine; avoid wall, incline or prone pressups for serratus anterior End range stretching gently without forceful overpressure in all planes (elevation in scapular plane, ER in scapular plane, functional IR) with stretching done for life as part of a daily routine NO UPPER BODY ERGOMETER     CRITERIA FOR DISCHARGE FROM SKILLED PHYSICAL THERAPY  Pain free AROM for shoulder elevation (expect around 135-150)  Functional strength for all ADL's, work tasks, and hobbies approved by Careers adviser  Independence with home maintenance program   NOTES: 1. With proper exercise, motion, strength, and function continue to improve even after one year. 2. The complication rate after surgery is 5 - 8%. Complications include infection, fracture, heterotopic bone formation, nerve injury, instability, rotator cuff  tear, and tuberosity nonunion. Please look for clinical signs, unusual symptoms, or lack of progress with therapy and report those to Dr. Allena Katz. Prefer more communication than less.  3. The therapy plan above only serves as a guide. Please be aware of specific individualized patient instructions as written on the prescription or through discussions with the surgeon. 4. Please call Dr. Allena Katz if you have any specific questions or concerns [phone]

## 2023-12-04 NOTE — Evaluation (Signed)
 Occupational Therapy Re-Evaluation Patient Details Name: Jose Cuevas MRN: [id number] DOB: 03-Dec-1960 Today's Date: 12/04/2023   History of Present Illness   Pt is a 63 y.o. male admitted with tonic-clonic seizure, fall at home, sepsis due to pneumonia, respiratory failure and acute L MCA infarct with associated hemorrhage or mass effect noted on MRI 2/27. Pt found to have R shoulder dislocation and is s/p R reverse shoulder arthroplasty on 3/4. PMH significant for HTN, dyslipidemia, gastroparesis, RA, OSA and DDD     Clinical Impressions Pt seen for OT re-eval following R reverse shoulder arthroplasty on 3/4. RN present to provide medications, and spouse in room for OT session. Pt with much improved participation and overall alertness. Pt is HOH, which contributes to requiring increased time to follow commands. R shoulder immobilizer adjusted and pt/spouse educated on polar care, NWB status of RUE and positioning while in bed or recliner. Pt able to complete bed mobility with minA, rises from regular height bed with minA and performs ~5 ft functional mobility to sink using quad cane, minA for cues/standing balance with decreased motor planning of RLE. Pt limited by low back pain and fatigue, requesting to return to recliner. Anticipate pt requiring maxA for UB/LB ADLs due to R shoulder immobilization and impaired use of dominant RUE. Discharge recommendation appropriate, patient will benefit from continued inpatient follow up therapy, <3 hours/day. OT will continue to follow for functional gains.      If plan is discharge home, recommend the following:   Direct supervision/assist for medications management;Direct supervision/assist for financial management;Assist for transportation;Help with stairs or ramp for entrance;Assistance with feeding;Assistance with cooking/housework;Supervision due to cognitive status;A lot of help with walking and/or transfers;A lot of help with  bathing/dressing/bathroom      Equipment Recommendations   Other (comment)      Precautions/Restrictions   Precautions Precautions: Fall Recall of Precautions/Restrictions: Impaired Precaution/Restrictions Comments: post total shoulder RUE Required Braces or Orthoses: Sling Restrictions Weight Bearing Restrictions Per Provider Order: Yes RUE Weight Bearing Per Provider Order: Non weight bearing Other Position/Activity Restrictions: per orders may be off for dressing/bathing/exercise     Mobility Bed Mobility Overal bed mobility: Needs Assistance Bed Mobility: Supine to Sit     Supine to sit: Min assist     General bed mobility comments: Increased time, but able to complete with light minA    Transfers Overall transfer level: Needs assistance Equipment used: Quad cane Transfers: Sit to/from Stand Sit to Stand: Min assist     Step pivot transfers: Min assist     General transfer comment: rises from standard height bed      Balance Overall balance assessment: Needs assistance Sitting-balance support: Feet supported Sitting balance-Leahy Scale: Good       Standing balance-Leahy Scale: Fair Standing balance comment: required use of AD                           ADL either performed or assessed with clinical judgement   ADL Overall ADL's : Needs assistance/impaired Eating/Feeding: Set up;Sitting   Grooming: Set up;Sitting Grooming Details (indicate cue type and reason): anticipate pt able to perform grooming tasks with setup and use of non-dominent LUE Upper Body Bathing: Maximal assistance;Adhering to UE precautions;Sitting Upper Body Bathing Details (indicate cue type and reason): anticipate due to NWB of RUE Lower Body Bathing: Maximal assistance;Sit to/from stand;Sitting/lateral leans Lower Body Bathing Details (indicate cue type and reason): anticipate due to NWB of RUE  Upper Body Dressing : Maximal assistance;Cueing for UE  precautions   Lower Body Dressing: Maximal assistance;Sit to/from stand   Toilet Transfer: Minimal assistance;BSC/3in1;Ambulation (QC) Toilet Transfer Details (indicate cue type and reason): clinical judgement, pt requires minA for safety and cueing Toileting- Clothing Manipulation and Hygiene: Maximal assistance       Functional mobility during ADLs: Minimal assistance;Cane       Vision   Additional Comments: pt is able to read name bade and board on wall [facility]            Pertinent Vitals/Pain Pain Assessment Pain Assessment: 0-10 Pain Score: 5  Pain Location: R shoulder     Extremity/Trunk Assessment Upper Extremity Assessment Upper Extremity Assessment: RUE deficits/detail RUE: Shoulder pain at rest;Unable to fully assess due to pain;Unable to fully assess due to immobilization   Lower Extremity Assessment Lower Extremity Assessment:  (difficulties motor planning with RLE)       Communication Communication Communication: Impaired Factors Affecting Communication: Difficulty expressing self;Reduced clarity of speech (talks minimally, but expresses needs/wants with increased time)   Cognition Arousal: Alert Behavior During Therapy: Flat affect Cognition: Cognition impaired   Orientation impairments: Place, Time, Situation (alert to self, needs to rely on board for place, OT / spouse previously talked about situation so pt simply repeats what he heard in conversation) Awareness: Intellectual awareness impaired, Online awareness impaired Memory impairment (select all impairments): Working memory Attention impairment (select first level of impairment): Sustained attention Executive functioning impairment (select all impairments): Initiation, Organization, Sequencing, Reasoning, Problem solving OT - Cognition Comments: delayed responses (pt very HOH even with hearing aides)                 Following commands: Intact Following commands impaired: Follows one step  commands with increased time     Cueing  General Comments   Cueing Techniques: Verbal cues;Tactile cues;Visual cues;Gestural cues  pt on .75L via [place], O2 sats 95% or above at rest, maintained above 93% with mobility, dropping to 89% with last transfer to recliner, O2 reapplied at .75 and sats increasing to 95%. Left on .75L/Min O2      OT Goals(Current goals can be found in the care plan section)   Acute Rehab OT Goals OT Goal Formulation: With patient/family Time For Goal Achievement: 12/18/23 Potential to Achieve Goals: Good ADL Goals Pt Will Perform Grooming: with modified independence;sitting Pt Will Perform Lower Body Dressing: with modified independence;sitting/lateral leans;sit to/from stand Pt Will Transfer to Toilet: with modified independence;ambulating Pt Will Perform Toileting - Clothing Manipulation and hygiene: with modified independence;sit to/from stand;sitting/lateral leans   OT Frequency:  Min 2X/week       AM-PAC OT "6 Clicks" Daily Activity     Outcome Measure Help from another person eating meals?: A Little Help from another person taking care of personal grooming?: A Little Help from another person toileting, which includes using toliet, bedpan, or urinal?: A Lot Help from another person bathing (including washing, rinsing, drying)?: A Lot Help from another person to put on and taking off regular upper body clothing?: A Lot Help from another person to put on and taking off regular lower body clothing?: A Lot 6 Click Score: 14   End of Session Equipment Utilized During Treatment: Gait belt;Oxygen (QC) Nurse Communication: Mobility status  Activity Tolerance: Patient tolerated treatment well Patient left: in chair;with call bell/phone within reach;with family/visitor present;with chair alarm set  OT Visit Diagnosis: Other abnormalities of gait and mobility (R26.89);Muscle weakness (generalized) (M62.81)  Time: 4696-2952 OT Time  Calculation (min): 43 min Charges:  OT General Charges $OT Visit: 1 Visit OT Evaluation $OT Re-eval: 1 Re-eval OT Treatments $Self Care/Home Management : 38-52 mins  Mavrick Mcquigg L. Primus Gritton, OTR/L  12/04/23, 4:12 PM

## 2023-12-04 NOTE — Progress Notes (Signed)
  Subjective: 1 Day Post-Op Procedure(s) (LRB): REVERSE SHOULDER ARTHROPLASTY (Right) Patient reports pain as mild.   Patient is well, and has had no acute complaints or problems Plan is to go Rehab after hospital stay. Negative for chest pain and shortness of breath Fever: no Gastrointestinal: Negative for nausea and vomiting  Objective: Vital signs in last 24 hours: Temp:  [96.3 F (35.7 C)-98.5 F (36.9 C)] 97.6 F (36.4 C) (03/05 0500) Pulse Rate:  [78-94] 85 (03/05 0500) Resp:  [15-23] 17 (03/05 0500) BP: (82-147)/(43-88) 146/66 (03/05 0500) SpO2:  [93 %-99 %] 96 % (03/05 0500)  Intake/Output from previous day:  Intake/Output Summary (Last 24 hours) at 12/04/2023 0716 Last data filed at 12/04/2023 0450 Gross per 24 hour  Intake 1200 ml  Output 295 ml  Net 905 ml    Intake/Output this shift: No intake/output data recorded.  Labs: Recent Labs    12/02/23 0230 12/03/23 0434 12/04/23 0403  HGB 11.6* 11.5* 11.1*   Recent Labs    12/03/23 0434 12/04/23 0403  WBC 9.1 11.6*  RBC 3.97* 3.87*  HCT 34.3* 33.0*  PLT 294 327   Recent Labs    12/03/23 0434 12/04/23 0403  NA 138 138  K 3.6 4.4  CL 107 108  CO2 22 20*  BUN 14 19  CREATININE 0.89 0.83  GLUCOSE 99 122*  CALCIUM 8.9 8.8*   No results for input(s): "LABPT", "INR" in the last 72 hours.   EXAM General - Patient is Confused with minimal communication. Extremity - Neurovascular intact Sensation intact distally Dressing/Incision - clean, dry, with the Hemovac removed with no complication Motor Function - intact, moving fingers well on exam.  Grip strength intact  Past Medical History:  Diagnosis Date   Back pain    Collagen vascular disease (HCC)    Rhematoid Arthritis hx.   Constipation, chronic    DDD (degenerative disc disease), cervical    History of duodenal ulcer    History of kidney stones    Hyperlipidemia    Hypertension    Low kidney function    LT Kidney non-functioning    Nondiabetic gastroparesis    Peripheral blood vessel disorder (HCC)    Rheumatoid arthritis with rheumatoid factor (HCC)    Sleep apnea    Weight loss     Assessment/Plan: 1 Day Post-Op Procedure(s) (LRB): REVERSE SHOULDER ARTHROPLASTY (Right) Principal Problem:   Sepsis due to pneumonia [facility]) Active Problems:   Rheumatoid arthritis with rheumatoid factor (HCC)   Essential hypertension   Centrilobular emphysema (HCC)   New onset seizure (HCC)   IBS (irritable bowel syndrome)   AKI (acute kidney injury) (HCC)   Acute respiratory failure with hypoxia (HCC)   Dyslipidemia   Anxiety and depression   GERD without esophagitis   Hypokalemia  Estimated body mass index is 22.15 kg/m as calculated from the following:   Height as of this encounter: 5\' 9"  (1.753 m).   Weight as of this encounter: 68 kg. Advance diet Up with therapy  Discharge instructions: Follow-up with [facility] orthopedics in 2 weeks for staple removal and x-rays of the right shoulder.  DVT Prophylaxis - Aspirin Shoulder immobilizer on right shoulder at all times unless bathing and physical therapy.  Dedra Skeens, PA-C Orthopaedic Surgery 12/04/2023, 7:16 AM

## 2023-12-04 NOTE — Progress Notes (Signed)
 [facility] Progress Note  Patient: Jose Cuevas    JWJ:[id number]  DOA: 11/26/2023     Date of Service: the patient was seen and examined on 12/04/2023  Chief Complaint  Patient presents with   unresponsive   Brief hospital course: CORDEL DREWES is a 63 y.o. Caucasian male with medical history significant for hypertension, dyslipidemia, gastroparesis, rheumatoid arthritis, OSA and DDD, who presented to the emergency room with acute onset of tonic-clonic seizure witnessed by his family with postictal confusion and lack of memory about the event.  The patient later had nausea and vomiting.  Did not any fever or chills.  No chest pain or palpitations.  He had right shoulder pain when he fell during his seizure.  The patient denies any significant cough or wheezing or dyspnea.  No paresthesias or focal muscle weakness.  He had no urinary or stool incontinence.  No tongue bites.  No dysuria, oliguria or hematuria or flank pain.   ED Course: When the patient came to the ER, BP was 151/84 with pulse oximetry of 98% on 4 L of O2 by nasal cannula and later respiratory rate was 30 and otherwise normal vital signs.  Labs revealed hypokalemia 3.1 and creatinine 1.33 above previous levels.  High-sensitivity troponin I was 54 and later 125.  Lactic acid was 4.4 and later 2.5.  CBC showed leukocytosis 21.6 with neutrophilia. EKG as reviewed by me : EKG showed sinus rhythm with rate of 92 and left bundle branch block. Imaging: Right shoulder x-ray showed osteoarthritis with no acute displaced fracture.  It showed emphysema with stable right pleural thickening versus small pleural effusion.  Portable chest x-ray showed no acute cardiopulmonary disease. Chest CTA revealed the following: 1. No evidence of significant pulmonary embolus. 2. Cardiac enlargement with left ventricular hypertrophy. 3. Probable loculated collection in the right pleural space, possibly related to infection. Infiltrates in the left 4. Small  left pleural effusion with basilar atelectasis. 5. Diffuse emphysematous changes throughout the lungs with subpleural fibrosis. 6. Multiple pulmonary nodules. Most significant: Right solid pulmonary nodule within the upper lobe measuring 7 mm. Per Fleischner Society Guidelines, recommend a non-contrast Chest CT at 3-6 months, then another non-contrast Chest CT at 18-24 months. 7. Hilar and mediastinal lymphadenopathy is nonspecific and could represent reactive change versus lymphoproliferative process. Similar appearance to previous study. 8. 4.2 cm diameter ascending thoracic aortic aneurysm. Recommend annual imaging followup by CTA or MRA. 9. Aortic atherosclerosis.   Noncontrasted head CT scan revealed no acute intracranial normalities.  It showed bilateral mastoid effusions.  The patient was given IV Rocephin and Zithromax, p.o. ibuprofen, 4 mg of IV Zofran, 40 mill Cabbell of p.o. potassium chloride and 1 L bolus of IV normal saline.  He will be admitted to a medical telemetry bed for further evaluation and management.   Assessment and Plan:  # Acute left MCA infarct MRI: Positive for several punctate acute infarcts scattered in the Left MCA territory. Associated hemorrhage or mass effect. DAPT x 90 days f/b ASA 81 mg pod LDL 47, below goal 70, TSH 1.8 within normal range Held statin due to elevated CK level, but he does not need a statin as per neurology because LDL is below goal. Continue telemetry Continue NIH stroke scale as per protocol Follow PT OT and SLP TTE LVEF 35 to 40%, no WMA, moderate MR and AR Follow neurology for further recommendation Held Plavix on 2/28 and resumed on 3/4 after surgery Continue Plavix for total 90  days till Feb 26, 2024   # Right shoulder dislocation and possible fracture?  S/p fall and seizures Orthopedic surgery consulted, s/p reverse shoulder arthroplasty done on 12/03/2023 Continue Pian meds prn Continue Plavix as above for CVA Started Aspirin  325 mg p.o. daily for DVT prophylaxis for 6 weeks till 01/15/2024 followed by 81 mg p.o. daily for CVA Follow-up with [facility] clinic orthopedics in 2 weeks for staple removal and x-rays of the right shoulder.  Keep shoulder immobilizer on right shoulder at all times unless bathing and physical therapy.   # Sepsis due to pneumonia  # Acute respiratory failure with hypoxia due to PNA, Resolved Sepsis manifested by leukocytosis and tachypnea. S/p IV Rocephin x 5 days and Zithromax x 3 days, completed course Continue mucolytic therapy  and DuoNeb p.r.n. Blood cultures NGTD S/p IVF, and s/p NS 50 mL/h 2/28, fever Tmax 101. blood cultures NGTD, s/p Antibiotics Negative COVID flu and RSV Supplemental O2 inhalation weaned off, currently saturating well on room air   New onset seizure most likely due to acute CVA Continue Keppra 500 twice daily as per neuro - Continue seizure precautions.  EEG negative - Neurology consulted, recommended MRI brain and C-spine.  MRI positive for acute CVA -Continue prn IV Ativan for now.    Elevated troponin, most likely demand ischemia Trop peaked 174 Patient denies any chest pain TTE LVEF 35 to 40%, no WMA, moderate MR and AR Systolic CHF, unknown acuity, low EF found on echocardiogram.  Currently euvolemic, no exacerbation noticed.   Rhabdomyolysis most likely due to seizures.  Resolved CK 1700--625--318 S/p IV fluid, continue gentle hydration and encourage oral intake  Hypokalemia, potassium repleted Hypomagnesemia, mag repleted. Monitor electrolytes and replete as needed.    AKI (acute kidney injury) resolved Renal functions improved after IV fluid Monitor renal functions daily Discontinued Lasix and hydrochlorothiazide for now Metabolic acidosis, started oral bicarbonate. Monitor BMP daily    Centrilobular emphysema (HCC) - The patient be placed on scheduled and as needed DuoNebs. - Mucolytic therapy will be provided.   GERD without  esophagitis - We will continue PPI therapy.   Anxiety and depression - We will continue Klonopin, Remeron and Cymbalta.   Dyslipidemia - Hold the statin for now due to elevated CK level   IBS (irritable bowel syndrome) - continue Amitiza.   Essential hypertension Started metoprolol 25 mg p.o. twice daily 3/5 Started losartan 25 mg p.o. daily Discontinued Lasix for now Monitor BP and titrate medications accordingly  Rheumatoid arthritis with rheumatoid factor (HCC) -continue methotrexate.   Pulmonary nodules, incidental finding CT scan: Right solid pulmonary nodule within the upper lobe measuring 7 mm. Per Fleischner Society Guidelines, recommend a non-contrast Chest CT at 3-6 months, then another non-contrast Chest CT at 18-24 months. Recommended to follow with PCP and pulmonary as an outpatient  Ascending aortic aneurysm, incidental finding CT scan: 4.2 cm diameter ascending thoracic aortic aneurysm. Recommend annual imaging followup by CTA or MRA. 9. Aortic atherosclerosis. Follow with PCP as an outpatient  Vitamin D insufficiency, started vitamin D 50,000 units p.o. weekly.  Body mass index is 22.15 kg/m.  Interventions:  Diet: Heart healthy diet DVT Prophylaxis: Subcutaneous Lovenox   Advance goals of care discussion: Full code  Family Communication: family was present at bedside, at the time of interview.  The pt provided permission to discuss medical plan with the family. Opportunity was given to ask question and all questions were answered satisfactorily.   Disposition:  Pt is from  Home, admitted with Acute CVA, seizures, respiratory failure, pneumonia, right shoulder dislocation, s/p ORIF done on 12/03/23, medically stable to discharge.   Discharge to SNF, when bed will be available.   Follow TOC for disposition plan  Subjective: No significant overnight event, patient was lying comfortably in the bed, stated that pain is 3/10, a lot better than before.  Denied  any other complaints.   Physical Exam: General: NAD, looked tired Appear in no distress, affect depressed Eyes: PERRLA   ENT: Oral Mucosa Clear, moist  Neck: no JVD,  Cardiovascular: S1 and S2 Present, no Murmur,  Respiratory: Equal air entry bilaterally, mild crackles, no wheezes.   Abdomen: Bowel Sound present, Soft and no tenderness,  Skin: no rashes Extremities: no Pedal edema, no calf tenderness Neurologic: CN grossly intact, Rt arm in sling s/p ORIF,  Gait not checked due to patient safety concerns  Vitals:   12/04/23 0105 12/04/23 0500 12/04/23 0802 12/04/23 1147  BP: 139/84 (!) 146/66 (!) 166/99 135/73  Pulse: 92 85 (!) 103 82  Resp: 20 17  18   Temp: (!) 96.3 F (35.7 C) 97.6 F (36.4 C) 98.1 F (36.7 C) 98.2 F (36.8 C)  TempSrc:  Oral Oral Oral  SpO2: 95% 96% 98% 98%  Weight:      Height:        Intake/Output Summary (Last 24 hours) at 12/04/2023 1322 Last data filed at 12/04/2023 0450 Gross per 24 hour  Intake 1200 ml  Output 295 ml  Net 905 ml   Filed Weights   11/26/23 1716  Weight: 68 kg    Data Reviewed: I have personally reviewed and interpreted daily labs, tele strips, imagings as discussed above. I reviewed all nursing notes, pharmacy notes, vitals, pertinent old records I have discussed plan of care as described above with RN and patient/family.  CBC: Recent Labs  Lab 11/29/23 0456 11/30/23 0518 12/01/23 0216 12/02/23 0230 12/03/23 0434 12/04/23 0403  WBC 8.9 9.2 10.9* 8.5 9.1 11.6*  NEUTROABS 5.9  --   --   --   --   --   HGB 11.4* 11.7* 11.5* 11.6* 11.5* 11.1*  HCT 34.7* 35.7* 34.8* 35.1* 34.3* 33.0*  MCV 87.0 85.8 87.0 86.9 86.4 85.3  PLT 176 226 256 265 294 327   Basic Metabolic Panel: Recent Labs  Lab 11/28/23 0559 11/29/23 0409 11/29/23 0456 11/30/23 0518 12/01/23 0216 12/02/23 0230 12/03/23 0434 12/04/23 0403  NA 133*  --    < > 140 138 137 138 138  K 4.3  --    < > 3.9 3.4* 3.6 3.6 4.4  CL 105  --    < > 111 109 108  107 108  CO2 19*  --    < > 19* 20* 20* 22 20*  GLUCOSE 122*  --    < > 82 123* 113* 99 122*  BUN 12  --    < > 14 17 15 14 19   CREATININE 1.15  --    < > 0.91 0.95 0.93 0.89 0.83  CALCIUM 8.2*  --    < > 8.6* 8.6* 8.6* 8.9 8.8*  MG 2.1 1.9  --  1.9 1.8 2.0  --   --   PHOS 3.2 3.3  --  3.3 2.9 3.6  --   --    < > = values in this interval not displayed.    Studies: DG Shoulder Right Port Result Date: 12/03/2023 CLINICAL DATA:  Status post shoulder replacement. EXAM:  RIGHT SHOULDER - 1 VIEW COMPARISON:  Radiograph 11/26/2023 FINDINGS: Reverse shoulder arthroplasty in expected alignment. Cerclage wire fixation about the humeral component. No periprosthetic lucency. Recent postsurgical change includes air and edema in the soft tissues. Overlying drain in place. Skin staples. IMPRESSION: Reverse right shoulder arthroplasty without immediate postoperative complication. Electronically Signed   By: Narda Rutherford M.D.   On: 12/03/2023 19:45     Scheduled Meds:  acetaminophen  1,000 mg Oral Q8H   aspirin EC  325 mg Oral Daily   clopidogrel  75 mg Oral Daily   cyanocobalamin  1,000 mcg Oral Daily   docusate sodium  100 mg Oral BID   DULoxetine  30 mg Oral QHS   folic acid  1 mg Oral Daily   guaiFENesin  600 mg Oral BID   hydroxychloroquine  200 mg Oral 2 times per day on Monday Tuesday Wednesday Thursday Friday   hydroxychloroquine  200 mg Oral Once per day on Sunday Saturday   levETIRAcetam  500 mg Oral BID   losartan  25 mg Oral Daily   lubiprostone  24 mcg Oral BID   magnesium oxide  400 mg Oral BID   metoCLOPramide  10 mg Oral TID AC   metoprolol tartrate  25 mg Oral BID   mirtazapine  15 mg Oral QHS   pantoprazole  40 mg Oral BID   tamsulosin  0.4 mg Oral Daily   traZODone  50 mg Oral QHS   Vitamin D (Ergocalciferol)  50,000 Units Oral Q7 days   Continuous Infusions:   ceFAZolin (ANCEF) IV 2 g (12/04/23 0912)    PRN Meds: acetaminophen, alum & mag hydroxide-simeth, bisacodyl,  HYDROmorphone (DILAUDID) injection, LORazepam, menthol-cetylpyridinium **OR** phenol, metoCLOPramide **OR** metoCLOPramide (REGLAN) injection, ondansetron **OR** ondansetron (ZOFRAN) IV, oxyCODONE, oxyCODONE, polyvinyl alcohol, senna-docusate  Time spent: 55 minutes  Author: Gillis Santa. MD [facility] 12/04/2023 1:22 PM  To reach On-call, see care teams to locate the attending and reach out to them via [email or web address]. If 7PM-7AM, please contact night-coverage If you still have difficulty reaching the attending provider, please page the [facility] (Director on Call) for [facility] on amion for assistance.

## 2023-12-04 NOTE — Evaluation (Signed)
 Physical Therapy Evaluation Patient Details Name: Jose Cuevas MRN: [id number] DOB: 01/28/1961 Today's Date: 12/04/2023  History of Present Illness  63 yo man who presented to hospital after MRI showing Positive for several punctate acute infarcts scattered in the  Left MCA territory. Associated hemorrhage or mass effect., No other acute intracranial abnormality. Solitary chronic  microhemorrhage and mild for age underlying white matter signal  changes, most commonly due to chronic small vessel disease, Bilateral mastoid effusions, likely postinflammatory. No  complicating features are identified.      PMH significant for HL, HTN, gastroparesis, RA, OSA, Centrilobular emphysema (HCC), IBS  Clinical Impression  Pt was able to be more interactive and much more able to use R LE than when seen on eval last week.  He is now POD1 R total shoulder replacement.  Overall pt showed very good motivation and was able to do bulk of bed mobility and transfers with little assist.  R LE was able to hold his weight w/o buckling and pt was able to do 2 bouts of ambulation (1st with HW, 2nd with QC).  Educated on total shoulder distal exercises, sling use and limitations.  Pt has made nice functional mobility gains as compared to initial eval.        If plan is discharge home, recommend the following: Two people to help with walking and/or transfers;Two people to help with bathing/dressing/bathroom;Assistance with cooking/housework;Assistance with feeding;Direct supervision/assist for medications management;Direct supervision/assist for financial management;Assist for transportation;Help with stairs or ramp for entrance   Can travel by private vehicle   No    Equipment Recommendations  (TBD at rehab)  Recommendations for Other Services       Functional Status Assessment Patient has had a recent decline in their functional status and demonstrates the ability to make significant improvements in function in a  reasonable and predictable amount of time.     Precautions / Restrictions Precautions Precautions: Fall Precaution/Restrictions Comments: post total shoulder RUE Required Braces or Orthoses: Sling Restrictions Weight Bearing Restrictions Per Provider Order: Yes RUE Weight Bearing Per Provider Order: Non weight bearing      Mobility  Bed Mobility Overal bed mobility: Needs Assistance Bed Mobility: Supine to Sit     Supine to sit: Min assist     General bed mobility comments: Pt showed great effort with getting himself to EOB (to L), able to use R LE to bridge hips and was able to sequentially get R LE to EOB.  Heavy use of rail with L UE to pull up to EOB needing only minimal guarding/assist to fully get to sitting    Transfers Overall transfer level: Needs assistance Equipment used: Hemi-walker, Quad cane Transfers: Sit to/from Stand Sit to Stand: From elevated surface, Min assist, Contact guard assist           General transfer comment: unable to rise from standard height bed, but needed only CGA to get to standing after raising bed ~2".  Cuing for L hand use and general set up/sequencing    Ambulation/Gait Ambulation/Gait assistance: Contact guard assist Gait Distance (Feet): 60 Feet Assistive device: Hemi-walker, Quad cane         General Gait Details: 2 bouts of ambulation, ~60 ft each.  Initially with HW and then with QC.  Pt with slow and guarded effort, but did not have buckling on R and with cuing for appropriate AD use showed surprisingly safe and confident gait.  Stairs  Wheelchair Mobility     Tilt Bed    Modified Rankin (Stroke Patients Only)       Balance Overall balance assessment: Needs assistance Sitting-balance support: Feet supported Sitting balance-Leahy Scale: Good Sitting balance - Comments: pt able to maintain sitting balance w/o support, poor posture but no LOBs     Standing balance-Leahy Scale: Fair Standing  balance comment: needed AD, trial of low grade dynamic standing balance w/o UEs, clearly needing UE support at this point but far improved per eval                             Pertinent Vitals/Pain Pain Assessment Pain Assessment: 0-10 Pain Score: 5  Pain Location: R shoulder    Home Living Family/patient expects to be discharged to:: Skilled nursing facility Living Arrangements: Spouse/significant other Available Help at Discharge: Family;Available 24 hours/day Type of Home: Mobile home Home Access: Stairs to enter Entrance Stairs-Rails: Can reach both Entrance Stairs-Number of Steps: 8   Home Layout: One level Home Equipment: Cane - single point;Rolling Walker (2 wheels)      Prior Function Prior Level of Function : Independent/Modified Independent;History of Falls (last six months);Driving             Mobility Comments: amb with no AD ADLs Comments: MOD I-I in ADL/IADL, retired     Extremity/Trunk Assessment   Upper Extremity Assessment Upper Extremity Assessment:  (R in sling, L functional t/o) RUE Deficits / Details: readjusted immob/sling - educated on distal AROM tasks    Lower Extremity Assessment Lower Extremity Assessment:  (L LE WNL, R moving much better than on eval, grossly 4-/5 with decreased quality of motion)       Communication   Communication Communication:  (minimally verbal but able to interact appropriately and make needs known)    Cognition Arousal: Alert Behavior During Therapy: Flat affect                           PT - Cognition Comments: Pt much more responsive and interactive than when this PT saw him last week Following commands: Intact Following commands impaired: Follows one step commands with increased time     Cueing Cueing Techniques: Verbal cues, Tactile cues, Visual cues, Gestural cues     General Comments General comments (skin integrity, edema, etc.): ambulation on room air, O2 in the high 90s at  beginning of ambulation and down to low 90s with ambulation.    Exercises     Assessment/Plan    PT Assessment Patient needs continued PT services  PT Problem List Decreased strength;Decreased range of motion;Decreased activity tolerance;Decreased balance;Decreased mobility;Decreased coordination;Decreased cognition;Decreased knowledge of use of DME;Decreased safety awareness;Cardiopulmonary status limiting activity;Decreased knowledge of precautions;Pain       PT Treatment Interventions DME instruction;Stair training;Gait training;Functional mobility training;Therapeutic activities;Therapeutic exercise;Balance training;Neuromuscular re-education;Cognitive remediation;Patient/family education;Wheelchair mobility training    PT Goals (Current goals can be found in the Care Plan section)  Acute Rehab PT Goals Patient Stated Goal: get back home PT Goal Formulation: With patient/family Time For Goal Achievement: 12/17/23 Potential to Achieve Goals: Fair    Frequency Min 3X/week     Co-evaluation               AM-PAC PT "6 Clicks" Mobility  Outcome Measure Help needed turning from your back to your side while in a flat bed without using bedrails?: A Little Help needed moving  from lying on your back to sitting on the side of a flat bed without using bedrails?: A Little Help needed moving to and from a bed to a chair (including a wheelchair)?: A Little Help needed standing up from a chair using your arms (e.g., wheelchair or bedside chair)?: A Lot Help needed to walk in hospital room?: A Lot Help needed climbing 3-5 steps with a railing? : Total 6 Click Score: 14    End of Session Equipment Utilized During Treatment: Gait belt;Oxygen Activity Tolerance: Patient limited by fatigue Patient left: with chair alarm set;with call bell/phone within reach Nurse Communication: Mobility status PT Visit Diagnosis: Unsteadiness on feet (R26.81);Muscle weakness (generalized)  (M62.81);History of falling (Z91.81);Other symptoms and signs involving the nervous system (R29.898);Hemiplegia and hemiparesis;Pain Hemiplegia - Right/Left: Right Hemiplegia - dominant/non-dominant: Dominant Hemiplegia - caused by: Cerebral infarction Pain - Right/Left: Right Pain - part of body: Shoulder    Time: 6433-2951 PT Time Calculation (min) (ACUTE ONLY): 32 min   Charges:   PT Evaluation $PT Re-evaluation: 1 Re-eval PT Treatments $Gait Training: 8-22 mins PT General Charges $$ ACUTE PT VISIT: 1 Visit         Malachi Pro, DPT 12/04/2023, 1:27 PM

## 2023-12-04 NOTE — Plan of Care (Signed)
  Problem: Fluid Volume: Goal: Hemodynamic stability will improve Outcome: Progressing   Problem: Education: Goal: Knowledge of General Education information will improve Description: Including pain rating scale, medication(s)/side effects and non-pharmacologic comfort measures Outcome: Progressing   Problem: Clinical Measurements: Goal: Respiratory complications will improve Outcome: Progressing   Problem: Nutrition: Goal: Adequate nutrition will be maintained Outcome: Progressing

## 2023-12-05 ENCOUNTER — Encounter: Payer: Self-pay | Admitting: Orthopedic Surgery

## 2023-12-05 DIAGNOSIS — A419 Sepsis, unspecified organism: Secondary | ICD-10-CM | POA: Diagnosis not present

## 2023-12-05 DIAGNOSIS — J189 Pneumonia, unspecified organism: Secondary | ICD-10-CM | POA: Diagnosis not present

## 2023-12-05 LAB — BASIC METABOLIC PANEL
Anion gap: 9 (ref 5–15)
BUN: 18 mg/dL (ref 8–23)
CO2: 22 mmol/L (ref 22–32)
Calcium: 8.9 mg/dL (ref 8.9–10.3)
Chloride: 109 mmol/L (ref 98–111)
Creatinine, Ser: 0.81 mg/dL (ref 0.61–1.24)
GFR, Estimated: >60 mL/min (ref >60)
Glucose, Bld: 103 mg/dL — ABNORMAL HIGH (ref 70–99)
Potassium: 3.5 mmol/L (ref 3.5–5.1)
Sodium: 140 mmol/L (ref 135–145)

## 2023-12-05 LAB — CBC
HCT: 31.6 % — ABNORMAL LOW (ref 39.0–52.0)
Hemoglobin: 10.6 g/dL — ABNORMAL LOW (ref 13.0–17.0)
MCH: 28.8 pg (ref 26.0–34.0)
MCHC: 33.5 g/dL (ref 30.0–36.0)
MCV: 85.9 fL (ref 80.0–100.0)
Platelets: 308 10*3/uL (ref 150–400)
RBC: 3.68 MIL/uL — ABNORMAL LOW (ref 4.22–5.81)
RDW: 15.3 % (ref 11.5–15.5)
WBC: 10.5 10*3/uL (ref 4.0–10.5)
nRBC: 0 % (ref 0.0–0.2)

## 2023-12-05 LAB — PHOSPHORUS: Phosphorus: 2.7 mg/dL (ref 2.5–4.6)

## 2023-12-05 LAB — MAGNESIUM: Magnesium: 1.9 mg/dL (ref 1.7–2.4)

## 2023-12-05 MED ORDER — LEVETIRACETAM 500 MG PO TABS
500.0000 mg | ORAL_TABLET | Freq: Two times a day (BID) | ORAL | Status: DC
Start: 2023-12-05 — End: 2024-08-18

## 2023-12-05 MED ORDER — VITAMIN D (ERGOCALCIFEROL) 1.25 MG (50000 UNIT) PO CAPS: 50000.0000 [IU] | ORAL_CAPSULE | ORAL | Status: DC

## 2023-12-05 MED ORDER — LOSARTAN POTASSIUM 25 MG PO TABS: 25.0000 mg | ORAL_TABLET | Freq: Every day | ORAL | Status: DC

## 2023-12-05 MED ORDER — POTASSIUM CHLORIDE CRYS ER 20 MEQ PO TBCR
40.0000 meq | EXTENDED_RELEASE_TABLET | Freq: Once | ORAL | Status: AC
  Administered 2023-12-05: 40 meq via ORAL
  Filled 2023-12-05: qty 2

## 2023-12-05 MED ORDER — CLOPIDOGREL BISULFATE 75 MG PO TABS: 75.0000 mg | ORAL_TABLET | Freq: Every day | ORAL | Status: DC

## 2023-12-05 MED ORDER — METOPROLOL TARTRATE 25 MG PO TABS: 25.0000 mg | ORAL_TABLET | Freq: Two times a day (BID) | ORAL | Status: DC

## 2023-12-05 MED ORDER — TRAZODONE HCL 50 MG PO TABS: 50.0000 mg | ORAL_TABLET | Freq: Every evening | ORAL | Status: AC | PRN

## 2023-12-05 MED ORDER — ACETAMINOPHEN 325 MG PO TABS
325.0000 mg | ORAL_TABLET | Freq: Four times a day (QID) | ORAL | Status: AC | PRN
Start: 2023-12-05 — End: ?

## 2023-12-05 MED ORDER — ENSURE ACTIVE HEART HEALTH PO LIQD: 1.0000 | Freq: Three times a day (TID) | ORAL | Status: AC | PRN

## 2023-12-05 MED ORDER — BISACODYL 10 MG RE SUPP: 10.0000 mg | Freq: Every day | RECTAL | Status: AC | PRN

## 2023-12-05 NOTE — Progress Notes (Signed)
 Occupational Therapy Treatment Patient Details Name: Jose Cuevas MRN: [id number] DOB: 09-01-1961 Today's Date: 12/05/2023   History of present illness Pt is a 63 y.o. male admitted with tonic-clonic seizure, fall at home, sepsis due to pneumonia, respiratory failure and acute L MCA infarct with associated hemorrhage or mass effect noted on MRI 2/27. Pt found to have R shoulder dislocation and is s/p R reverse shoulder arthroplasty on 3/4. PMH significant for HTN, dyslipidemia, gastroparesis, RA, OSA and DDD   OT comments  Pt seen for OT tx following RN providing pain medication. Pt received seated EOB and agreeable to session. Pt/family/nursing educated in polar care and shoulder sling immobilizer mgt. Repositioned and reapplied for optimal joint protection, positioning, and comfort. Pt endorsing feeling warm and provided with cool washcloth to the back of his neck. Returned to bed to support improved LBP. Pt progressing towards goals, continues to benefit from skilled OT services.       If plan is discharge home, recommend the following:  Direct supervision/assist for medications management;Direct supervision/assist for financial management;Assist for transportation;Help with stairs or ramp for entrance;Assistance with feeding;Assistance with cooking/housework;Supervision due to cognitive status;A lot of help with walking and/or transfers;A lot of help with bathing/dressing/bathroom   Equipment Recommendations  Other (comment) (defer)    Recommendations for Other Services      Precautions / Restrictions Precautions Precautions: Fall;Shoulder Type of Shoulder Precautions: Operative arm to remain in sling and abduction pillow with arm at neutral at all times except RoM exercises and hygiene. Can perform pendulums, elbow/wrist/hand RoM exercises. Passive RoM allowed to 90 FF and 30 ER. Shoulder Interventions: Shoulder sling/immobilizer;Don joy ultra sling;Shoulder abduction pillow;At all  times;Off for dressing/bathing/exercises Recall of Precautions/Restrictions: Impaired Precaution/Restrictions Comments: post total shoulder RUE Required Braces or Orthoses: Sling Restrictions Weight Bearing Restrictions Per Provider Order: Yes RUE Weight Bearing Per Provider Order: Non weight bearing       Mobility Bed Mobility Overal bed mobility: Needs Assistance Bed Mobility: Sit to Supine       Sit to supine: Max assist        Transfers                   General transfer comment: pt declined 2/2 back pain and feeling hot     Balance Overall balance assessment: Needs assistance Sitting-balance support: Feet supported Sitting balance-Leahy Scale: Fair                                     ADL either performed or assessed with clinical judgement   ADL Overall ADL's : Needs assistance/impaired                 Upper Body Dressing : Maximal assistance;Cueing for UE precautions;Sitting                          Extremity/Trunk Assessment              Vision       Perception     Praxis     Communication     Cognition Arousal: Alert Behavior During Therapy: Flat affect                                 Following commands: Intact Following commands impaired: Follows one step commands with increased time  Cueing   Cueing Techniques: Verbal cues, Tactile cues, Visual cues, Gestural cues  Exercises Other Exercises Other Exercises: Pt/family/nursing educated in polar care and shoulder sling immobilizer. Repositioned and reapplied for optimal joint protection, positioning, and comfort    Shoulder Instructions       General Comments      Pertinent Vitals/ Pain       Pain Assessment Pain Assessment: 0-10 Pain Score: 6  Pain Location: LBP Pain Descriptors / Indicators: Aching, Grimacing, Guarding Pain Intervention(s): Limited activity within patient's tolerance, Monitored during session,  Premedicated before session, Repositioned, Ice applied  Home Living                                          Prior Functioning/Environment              Frequency  Min 2X/week        Progress Toward Goals  OT Goals(current goals can now be found in the care plan section)  Progress towards OT goals: Progressing toward goals  Acute Rehab OT Goals Patient Stated Goal: return to PLOF OT Goal Formulation: With patient/family Time For Goal Achievement: 12/18/23 Potential to Achieve Goals: Good  Plan      Co-evaluation                 AM-PAC OT "6 Clicks" Daily Activity     Outcome Measure   Help from another person eating meals?: A Little Help from another person taking care of personal grooming?: A Little Help from another person toileting, which includes using toliet, bedpan, or urinal?: A Lot Help from another person bathing (including washing, rinsing, drying)?: A Lot Help from another person to put on and taking off regular upper body clothing?: A Lot Help from another person to put on and taking off regular lower body clothing?: A Lot 6 Click Score: 14    End of Session    OT Visit Diagnosis: Other abnormalities of gait and mobility (R26.89);Muscle weakness (generalized) (M62.81)   Activity Tolerance Patient tolerated treatment well;Patient limited by pain   Patient Left in bed;with call bell/phone within reach;with bed alarm set;with family/visitor present   Nurse Communication Mobility status;Other (comment) (shoulder sling and polar care)        Time: 1610-9604 OT Time Calculation (min): 20 min  Charges: OT General Charges $OT Visit: 1 Visit OT Treatments $Self Care/Home Management : 8-22 mins  Arman Filter., MPH, MS, OTR/L ascom [phone] 12/05/23, 10:53 AM

## 2023-12-05 NOTE — Progress Notes (Signed)
 SLP Cancellation Note  Patient Details Name: Jose Cuevas MRN: [id number] DOB: [birth year]   Cancelled treatment:       Reason Eval/Treat Not Completed: SLP screened, no needs identified, will sign off (chart reviewed; met w/ pt/Wife; consulted MD/team)  Pt denied any difficulty swallowing and is currently on a regular diet; tolerates swallowing pills w/ water per NSG. Wife stated he is eating "softer" foods and that she is helping to feed him d/t the restriction of use of R shoulder/arm(pt is R handed).  Pt conversed to respond to general questions re: self w/out overt expressive/receptive deficits noted; pt and Wife denied any speech-language deficits. Speech intelligible.  Wife reported poor appetite and oral intake recently; MD and Team updated for f/u at next venue of care. No further skilled ST services indicated as pt appears at his baseline. Pt agreed. NSG to reconsult if any change in status while admitted.      Jerilynn Som, MS, CCC-SLP Speech Language Pathologist Rehab Services; [facility] [phone] (ascom) [name] 12/05/2023, 1:36 PM

## 2023-12-05 NOTE — TOC Transition Note (Signed)
 Transition of Care [facility]) - Discharge Note   Patient Details  Name: Jose Cuevas MRN: [id number] Date of Birth: [birth year]  Transition of Care [facility]) CM/SW Contact:  Erin Sons, LCSW Phone Number: 12/05/2023, 2:21 PM   Clinical Narrative:        Per MD patient ready for DC to [facility]. RN, patient, patient's family, and facility notified of DC. Discharge Summary and FL2 sent to facility. RN to call report prior to discharge [phone]). DC packet on chart. Ambulance transport requested for patient.   CSW will sign off for now as social work intervention is no longer needed. Please consult [place] again if new needs arise.   Final next level of care: Skilled Nursing Facility Barriers to Discharge: No Barriers Identified           Discharge Placement              Patient chooses bed at: [facility] Patient to be transferred to facility by: [facility] Name of family member notified: Spouse Toniann Fail Patient and family notified of of transfer: 12/05/23  Discharge Plan and Services Additional resources added to the After Visit Summary for                                       Social Drivers of Health (SDOH) Interventions SDOH Screenings   Food Insecurity: No Food Insecurity (11/27/2023)  Housing: Low Risk  (11/27/2023)  Transportation Needs: No Transportation Needs (11/27/2023)  Utilities: Not At Risk (11/27/2023)  Alcohol Screen: Low Risk  (03/23/2022)  Depression (PHQ2-9): Low Risk  (07/11/2023)  Social Connections: Socially Isolated (11/27/2023)  Tobacco Use: High Risk (12/03/2023)     Readmission Risk Interventions     No data to display

## 2023-12-05 NOTE — Progress Notes (Signed)
  Subjective: 2 Days Post-Op Procedure(s) (LRB): REVERSE SHOULDER ARTHROPLASTY (Right) Patient reports pain as mild.   Patient is well, and has had no acute complaints or problems Plan is to go Rehab after hospital stay. Negative for chest pain and shortness of breath Fever: no Gastrointestinal: Negative for nausea and vomiting  Objective: Vital signs in last 24 hours: Temp:  [97.4 F (36.3 C)-98.2 F (36.8 C)] 97.9 F (36.6 C) (03/06 0357) Pulse Rate:  [69-103] 75 (03/06 0357) Resp:  [16-24] 18 (03/06 0357) BP: (133-166)/(65-99) 133/65 (03/06 0357) SpO2:  [97 %-99 %] 97 % (03/06 0357)  Intake/Output from previous day: No intake or output data in the 24 hours ending 12/05/23 0710   Intake/Output this shift: No intake/output data recorded.  Labs: Recent Labs    12/03/23 0434 12/04/23 0403 12/05/23 0451  HGB 11.5* 11.1* 10.6*   Recent Labs    12/04/23 0403 12/05/23 0451  WBC 11.6* 10.5  RBC 3.87* 3.68*  HCT 33.0* 31.6*  PLT 327 308   Recent Labs    12/04/23 0403 12/05/23 0451  NA 138 140  K 4.4 3.5  CL 108 109  CO2 20* 22  BUN 19 18  CREATININE 0.83 0.81  GLUCOSE 122* 103*  CALCIUM 8.8* 8.9   No results for input(s): "LABPT", "INR" in the last 72 hours.   EXAM General - Patient is Confused with minimal communication. Extremity - Neurovascular intact Sensation intact distally Dressing/Incision - clean, dry, with the Hemovac removed with no complication Motor Function - intact, moving fingers well on exam.  Grip strength intact.  Ambulated 64 feet with PT.  Past Medical History:  Diagnosis Date   Back pain    Collagen vascular disease (HCC)    Rhematoid Arthritis hx.   Constipation, chronic    DDD (degenerative disc disease), cervical    History of duodenal ulcer    History of kidney stones    Hyperlipidemia    Hypertension    Low kidney function    LT Kidney non-functioning   Nondiabetic gastroparesis    Peripheral blood vessel disorder  (HCC)    Rheumatoid arthritis with rheumatoid factor (HCC)    Sleep apnea    Weight loss     Assessment/Plan: 2 Days Post-Op Procedure(s) (LRB): REVERSE SHOULDER ARTHROPLASTY (Right) Principal Problem:   Sepsis due to pneumonia [facility]) Active Problems:   Rheumatoid arthritis with rheumatoid factor (HCC)   Essential hypertension   Centrilobular emphysema (HCC)   New onset seizure (HCC)   IBS (irritable bowel syndrome)   AKI (acute kidney injury) (HCC)   Acute respiratory failure with hypoxia (HCC)   Dyslipidemia   Anxiety and depression   GERD without esophagitis   Hypokalemia  Estimated body mass index is 22.15 kg/m as calculated from the following:   Height as of this encounter: 5\' 9"  (1.753 m).   Weight as of this encounter: 68 kg. Advance diet Up with therapy  Discharge instructions: Follow-up with [facility] clinic orthopedics in 2 weeks for staple removal and x-rays of the right shoulder.  DVT Prophylaxis - Aspirin Shoulder immobilizer on right shoulder at all times unless bathing and physical therapy.  Dedra Skeens, PA-C Orthopaedic Surgery 12/05/2023, 7:10 AM

## 2023-12-05 NOTE — Discharge Summary (Addendum)
 [facility] Discharge Summary   Patient: Jose Cuevas NWG:[id number]  PCP: Sallyanne Kuster, NP  Date of admission: 11/26/2023   Date of discharge:  12/05/2023     Discharge Diagnoses:  Principal Problem:   Sepsis due to pneumonia [facility]) Active Problems:   New onset seizure (HCC)   Acute respiratory failure with hypoxia (HCC)   AKI (acute kidney injury) (HCC)   Hypokalemia   Centrilobular emphysema (HCC)   Rheumatoid arthritis with rheumatoid factor (HCC)   Essential hypertension   IBS (irritable bowel syndrome)   Dyslipidemia   Anxiety and depression   GERD without esophagitis   Admitted From: Home Disposition:  SNF   Recommendations for Outpatient Follow-up:  Follow-up with PCP, patient should be seen by an MD in 1 to 2 days, repeat BMP and CBC in 1 to 2 weeks Follow-up with [facility] clinic orthopedics in 2 weeks for staple removal and x-rays of the right shoulder. DVT Prophylaxis - Aspirin 325 mg p.o. daily for 6 weeks Shoulder immobilizer on right shoulder at all times unless bathing and physical therapy. Continue ice pack Follow-up with neurologist in 1 to 2 weeks due to stroke and seizures, may taper off Keppra gradually. Follow with the rheumatologist for rheumatoid arthritis treatment as an outpatient. Follow up LABS/TEST: CT chest in 3 to 6 months for pulmonary nodules Ascending aortic aneurysm 4.2 cm, needs CTA or MRA annually.  Follow with PCP, and refer to CT surgery   Contact information for follow-up providers     Sallyanne Kuster, NP Follow up.   Specialty: Nurse Practitioner Why: Hospital follow up Contact information: [address] [place] [address] [phone]         Dedra Skeens, PA-C Follow up in 2 week(s).   Specialty: Orthopedic Surgery Why: For staple removal and x-rays of the right shoulder Contact information: [address] [place] [address] [phone]              Contact information  for after-discharge care     Destination     [facility] Preferred SNF .   Service: Skilled Nursing Contact information: [address] [place] [address] [phone]                    Diet recommendation: Cardiac diet  Activity: The patient is advised to gradually reintroduce usual activities, as tolerated  Discharge Condition: stable  Code Status: Full code   History of present illness: As per the H and P dictated on admission Hospital Course:  Jose Cuevas is a 63 y.o. Caucasian male with medical history significant for hypertension, dyslipidemia, gastroparesis, rheumatoid arthritis, OSA and DDD, who presented to the emergency room with acute onset of tonic-clonic seizure witnessed by his family with postictal confusion and lack of memory about the event.  The patient later had nausea and vomiting.  Did not any fever or chills.  No chest pain or palpitations.  He had right shoulder pain when he fell during his seizure.  The patient denies any significant cough or wheezing or dyspnea.  No paresthesias or focal muscle weakness.  He had no urinary or stool incontinence.  No tongue bites.  No dysuria, oliguria or hematuria or flank pain.   ED Course: When the patient came to the ER, BP was 151/84 with pulse oximetry of 98% on 4 L of O2 by nasal cannula and later respiratory rate  was 30 and otherwise normal vital signs.  Labs revealed hypokalemia 3.1 and creatinine 1.33 above previous levels.  High-sensitivity troponin I was 54 and later 125.  Lactic acid was 4.4 and later 2.5.  CBC showed leukocytosis 21.6 with neutrophilia. EKG as reviewed by me : EKG showed sinus rhythm with rate of 92 and left bundle branch block. Imaging: Right shoulder x-ray showed osteoarthritis with no acute displaced fracture.  It showed emphysema with stable right pleural thickening versus small pleural effusion.   Portable chest x-ray showed no acute cardiopulmonary disease. Chest CTA revealed the following: 1. No evidence of significant pulmonary embolus. 2. Cardiac enlargement with left ventricular hypertrophy. 3. Probable loculated collection in the right pleural space, possibly related to infection. Infiltrates in the left 4. Small left pleural effusion with basilar atelectasis. 5. Diffuse emphysematous changes throughout the lungs with subpleural fibrosis. 6. Multiple pulmonary nodules. Most significant: Right solid pulmonary nodule within the upper lobe measuring 7 mm. Per Fleischner Society Guidelines, recommend a non-contrast Chest CT at 3-6 months, then another non-contrast Chest CT at 18-24 months. 7. Hilar and mediastinal lymphadenopathy is nonspecific and could represent reactive change versus lymphoproliferative process. Similar appearance to previous study. 8. 4.2 cm diameter ascending thoracic aortic aneurysm. Recommend annual imaging followup by CTA or MRA. 9. Aortic atherosclerosis.   Noncontrasted head CT scan revealed no acute intracranial normalities.  It showed bilateral mastoid effusions.  The patient was given IV Rocephin and Zithromax, p.o. ibuprofen, 4 mg of IV Zofran, 40 mill [place]ell of p.o. potassium chloride and 1 L bolus of IV normal saline.  He will be admitted to a medical telemetry bed for further evaluation and management.     Assessment and Plan:   # Acute left MCA infarct MRI: Positive for several punctate acute infarcts scattered in the Left MCA territory. Associated hemorrhage or mass effect. DAPT x 90 days f/b ASA 81 mg pod LDL 47, below goal 70, TSH 1.8 within normal range Held statin due to elevated CK level, resumed on discharge.  It can be discontinued if patient has ongoing muscular weakness.  Patient's LDL is at goal so he may not need statin. TTE LVEF 35 to 40%, no WMA, moderate MR and AR Held Plavix on 2/28 and resumed on 3/4 after surgery Continue Plavix for  total 90 days till Feb 26, 2024 PT and OT eval done, recommended SNF placement.  Cleared by neurology to discharge and follow as an outpatient.   # Right shoulder dislocation S/p fall and seizures Orthopedic surgery consulted, s/p reverse shoulder arthroplasty done on 12/03/2023 Continue Pian meds prn. Continue Plavix as above for CVA Started Aspirin 325 mg p.o. daily for DVT prophylaxis for 6 weeks till 01/15/2024 followed by 81 mg p.o. daily for CVA Follow-up with [facility] clinic orthopedics in 2 weeks for staple removal and x-rays of the right shoulder.  Keep shoulder immobilizer on right shoulder at all times unless bathing and physical therapy.    # Sepsis due to pneumonia, resolved # Acute respiratory failure with hypoxia due to PNA, Resolved Sepsis manifested by leukocytosis and tachypnea. S/p IV Rocephin x 5 days and Zithromax x 3 days, completed course S/p mucolytic therapy  and DuoNeb p.r.n. Blood cultures NGTD S/p IVF, and s/p NS 50 mL/h, On 2/28, fever Tmax 101. blood cultures NGTD, s/p Antibiotics. Negative COVID flu and RSV. Supplemental O2 inhalation weaned off, currently saturating well on room air    # New onset seizure most likely due to acute  CVA Continue Keppra 500 twice daily as per neuro. EEG negative Neurology consulted, recommended MRI brain and C-spine.  MRI positive for acute CVA. Continue prn IV Ativan for now. # Elevated troponin, most likely demand ischemia. Trop peaked 174 Patient denies any chest pain TTE LVEF 35 to 40%, no WMA, moderate MR and AR # Systolic CHF, unknown acuity, low EF found on echocardiogram.  Currently euvolemic, no exacerbation noticed. # Rhabdomyolysis most likely due to seizures.  Resolved CK 1700--625--318, S/p IV fluid, continue oral hydration # Hypokalemia, potassium repleted.  Resolved # Hypomagnesemia, mag repleted.  Resolved # AKI (acute kidney injury) resolved Renal functions improved after IV fluid. Discontinued Lasix and  hydrochlorothiazide for now. Metabolic acidosis, Resolved s/p oral bicarbonate. # Centrilobular emphysema: Stable now, patient received DuoNeb prn  # GERD without esophagitis: continue PPI therapy. # Anxiety and depression: d/c'd Klonopin on discharge, patient did not receive during hospital stay.  Continued Remeron and Cymbalta. # Dyslipidemia: Lipid profile within normal range, LDL below goal.  Statin was held during hospital stay due to rhabdomyolysis.  Resumed Crestor at discharge. # IBS (irritable bowel syndrome) continue Amitiza. # Essential hypertension: Started metoprolol 25 mg p.o. twice daily. 3/5 Started losartan 25 mg p.o. daily. Held Lasix during hospital stay, resumed on discharge, patient was taking 3 times a week, it can be held if volume status remained stable.  Potassium chloride need to be used with Lasix. # Rheumatoid arthritis with rheumatoid factor: Patient was on ARAVA and Plaquenil, but he was not actively taking it.  Recommend to follow with the rheumatologist as an outpatient to resume treatment accordingly.   # Pulmonary nodules, incidental finding CT scan: Right solid pulmonary nodule within the upper lobe measuring 7 mm. Per Fleischner Society Guidelines, recommend a non-contrast Chest CT at 3-6 months, then another non-contrast Chest CT at 18-24 months. Recommended to follow with PCP and pulmonary as an outpatient   # Ascending aortic aneurysm, incidental finding CT scan: 4.2 cm diameter ascending thoracic aortic aneurysm. Recommend annual imaging followup by CTA or MRA. 9. Aortic atherosclerosis. Follow with PCP as an outpatient   Vitamin D insufficiency, started vitamin D 50,000 units p.o. weekly.   Body mass index is 22.15 kg/m.  Nutrition Interventions:  Pain control  - Patient was instructed, not to drive, operate heavy machinery, perform activities at heights, swimming or participation in water activities or provide baby sitting services while on Pain,  Sleep and Anxiety Medications; until his outpatient Physician has advised to do so again.  - Also recommended to not to take more than prescribed Pain, Sleep and Anxiety Medications.  Patient was seen by physical therapy, who recommended No Therapy, pt/family refused.,  SNF placement, which was arranged. On the day of the discharge the patient's vitals were stable, and no other acute medical condition were reported by patient. the patient was felt safe to be discharge at [facility].  Consultants: Neurology, orthopedic surgery Procedures: s/p reverse shoulder arthroplasty done on 12/03/2023  Discharge Exam: General: Appear in no distress, no Rash; Oral Mucosa Clear, moist. Cardiovascular: S1 and S2 Present, no Murmur, Respiratory: normal respiratory effort, Bilateral Air entry present and no Crackles, no wheezes Abdomen: Bowel Sound present, Soft and no tenderness, no hernia Extremities: no Pedal edema, no calf tenderness, s/p right shoulder surgery, sling intact. Neurology: alert and oriented to time, place, and person affect appropriate.  Filed Weights   11/26/23 1716  Weight: 68 kg   Vitals:   12/05/23 0357 12/05/23 0744  BP: 133/65 131/68  Pulse: 75 79  Resp: 18 19  Temp: 97.9 F (36.6 C) 98.1 F (36.7 C)  SpO2: 97% 97%    DISCHARGE MEDICATION: Allergies as of 12/05/2023   No Known Allergies      Medication List     STOP taking these medications    BC FAST PAIN RELIEF ARTHRITIS PO   clonazePAM 1 MG tablet Commonly known as: KLONOPIN   hydroxychloroquine 200 MG tablet Commonly known as: PLAQUENIL   leflunomide 20 MG tablet Commonly known as: ARAVA       TAKE these medications    acetaminophen 325 MG tablet Commonly known as: TYLENOL Take 1-2 tablets (325-650 mg total) by mouth every 6 (six) hours as needed for mild pain (pain score 1-3) (or temp > 100.5).   albuterol 108 (90 Base) MCG/ACT inhaler Commonly known as: VENTOLIN HFA Inhale 2 puffs into the lungs  every 6 (six) hours as needed for wheezing or shortness of breath.   aspirin EC 325 MG tablet Take 1 tablet (325 mg total) by mouth daily.   bisacodyl 10 MG suppository Commonly known as: DULCOLAX Place 1 suppository (10 mg total) rectally daily as needed for moderate constipation.   clopidogrel 75 MG tablet Commonly known as: PLAVIX Take 1 tablet (75 mg total) by mouth daily. Start taking on: December 06, 2023   cyanocobalamin 1000 MCG tablet Commonly known as: VITAMIN B12 Take 1,000 mcg by mouth daily.   DULoxetine 30 MG capsule Commonly known as: CYMBALTA Take 1 capsule (30 mg total) by mouth at bedtime.   Ensure Active Heart Health Liqd Take 1 each by mouth 3 (three) times daily as needed.   folic acid 1 MG tablet Commonly known as: FOLVITE Take 1 mg by mouth daily.   furosemide 20 MG tablet Commonly known as: LASIX Take one tab po M/W/F What changed:  how much to take how to take this when to take this   levETIRAcetam 500 MG tablet Commonly known as: KEPPRA Take 1 tablet (500 mg total) by mouth 2 (two) times daily.   losartan 25 MG tablet Commonly known as: COZAAR Take 1 tablet (25 mg total) by mouth daily. Hold if SBP <120 Start taking on: December 06, 2023   lubiprostone 24 MCG capsule Commonly known as: AMITIZA Take 24 mcg by mouth daily.   metoCLOPramide 10 MG tablet Commonly known as: REGLAN Take 1 tablet (10 mg total) by mouth 3 (three) times daily before meals. What changed: when to take this   metoprolol tartrate 25 MG tablet Commonly known as: LOPRESSOR Take 1 tablet (25 mg total) by mouth 2 (two) times daily. Hold if SBP <110 mmHg and or HR <65   mirtazapine 15 MG tablet Commonly known as: REMERON Take 1 tablet (15 mg total) by mouth at bedtime.   oxyCODONE 5 MG immediate release tablet Commonly known as: Oxy IR/ROXICODONE Take 1-2 tablets (5-10 mg total) by mouth every 4 (four) hours as needed for moderate pain (pain score 4-6) (pain score  4-6). What changed:  medication strength how much to take when to take this reasons to take this additional instructions   pantoprazole 40 MG tablet Commonly known as: PROTONIX Take 1 tablet (40 mg total) by mouth 2 (two) times daily.   Potassium Chloride CR 8 MEQ Cpcr capsule CR Commonly known as: MICRO-K TAKE ONE TAB PO M/W/F WITH LASIX   rosuvastatin 5 MG tablet Commonly known as: CRESTOR TAKE 1 TABLET (5 MG TOTAL) BY  MOUTH DAILY. What changed: when to take this   tamsulosin 0.4 MG Caps capsule Commonly known as: FLOMAX Take 1 capsule (0.4 mg total) by mouth daily.   traZODone 50 MG tablet Commonly known as: DESYREL Take 1 tablet (50 mg total) by mouth at bedtime as needed for sleep.   Trelegy Ellipta 100-62.5-25 MCG/ACT Aepb Generic drug: Fluticasone-Umeclidin-Vilant Inhale 1 puff into the lungs daily.   Vitamin D (Ergocalciferol) 1.25 MG (50000 UNIT) Caps capsule Commonly known as: DRISDOL Take 1 capsule (50,000 Units total) by mouth every 7 (seven) days. Start taking on: December 06, 2023       No Known Allergies Discharge Instructions     Ambulatory referral to Neurology   Complete by: As directed    Call MD for:  difficulty breathing, headache or visual disturbances   Complete by: As directed    Call MD for:  extreme fatigue   Complete by: As directed    Call MD for:  persistant dizziness or light-headedness   Complete by: As directed    Call MD for:  persistant nausea and vomiting   Complete by: As directed    Call MD for:  redness, tenderness, or signs of infection (pain, swelling, redness, odor or green/yellow discharge around incision site)   Complete by: As directed    Call MD for:  severe uncontrolled pain   Complete by: As directed    Call MD for:  temperature >100.4   Complete by: As directed    Diet - low sodium heart healthy   Complete by: As directed    Discharge instructions   Complete by: As directed    Follow-up with PCP, patient  should be seen by an MD in 1 to 2 days, repeat BMP and CBC in 1 to 2 weeks Follow-up with [facility] clinic orthopedics in 2 weeks for staple removal and x-rays of the right shoulder. DVT Prophylaxis - Aspirin 325 mg p.o. daily for 6 weeks Shoulder immobilizer on right shoulder at all times unless bathing and physical therapy. Continue ice pack Follow-up with neurologist in 1 to 2 weeks due to stroke and seizures, may taper off Keppra gradually. Follow with the rheumatologist for rheumatoid arthritis treatment as an outpatient.   Increase activity slowly   Complete by: As directed        The results of significant diagnostics from this hospitalization (including imaging, microbiology, ancillary and laboratory) are listed below for reference.    Significant Diagnostic Studies: DG Shoulder Right Port Result Date: 12/03/2023 CLINICAL DATA:  Status post shoulder replacement. EXAM: RIGHT SHOULDER - 1 VIEW COMPARISON:  Radiograph 11/26/2023 FINDINGS: Reverse shoulder arthroplasty in expected alignment. Cerclage wire fixation about the humeral component. No periprosthetic lucency. Recent postsurgical change includes air and edema in the soft tissues. Overlying drain in place. Skin staples. IMPRESSION: Reverse right shoulder arthroplasty without immediate postoperative complication. Electronically Signed   By: Narda Rutherford M.D.   On: 12/03/2023 19:45   US ARTERIAL ABI (SCREENING LOWER EXTREMITY) Result Date: 11/29/2023 CLINICAL DATA:  Peripheral arterial disease EXAM: NONINVASIVE PHYSIOLOGIC VASCULAR STUDY OF BILATERAL LOWER EXTREMITIES TECHNIQUE: Evaluation of both lower extremities were performed at rest, including calculation of ankle-brachial indices with single level Doppler, pressure and pulse volume recording. COMPARISON:  None Available. FINDINGS: Right ABI:  1.1 Left ABI:  1.0 Right Lower Extremity:  Normal arterial waveforms at the ankle. Left Lower Extremity:  Normal arterial waveforms at the  ankle. 1.0-1.4 Normal IMPRESSION: Normal bilateral resting ankle-brachial indices. Electronically Signed  By: Malachy Moan M.D.   On: 11/29/2023 10:50   DG Chest Port 1 View Result Date: 11/29/2023 CLINICAL DATA:  [id number] Fever [id number] Follow-up exam [id number] EXAM: PORTABLE CHEST 1 VIEW COMPARISON:  Chest radiograph 11/26/2023 FINDINGS: Decreased small bilateral pleural effusions. Cardiomegaly. Unchanged cardiac and mediastinal contours. No pneumothorax. No new focal airspace opacity. There are prominent bilateral interstitial opacities that could represent mild pulmonary edema, unchanged from prior exam. No new focal airspace opacity. No radiographically apparent displaced rib fractures visualized upper abdomen is notable for mild colonic gaseous distention. IMPRESSION: Mild pulmonary edema with improved small bilateral pleural effusions. Electronically Signed   By: Lorenza Cambridge M.D.   On: 11/29/2023 08:19   CT HEAD WO CONTRAST ( ) Result Date: 11/29/2023 CLINICAL DATA:  Mental status change with unknown cause. EXAM: CT HEAD WITHOUT CONTRAST TECHNIQUE: Contiguous axial images were obtained from the base of the skull through the vertex without intravenous contrast. RADIATION DOSE REDUCTION: This exam was performed according to the departmental dose-optimization program which includes automated exposure control, adjustment of the mA and/or kV according to patient size and/or use of iterative reconstruction technique. COMPARISON:  11/28/2023 FINDINGS: Brain: No evidence of acute infarction, hemorrhage, hydrocephalus, extra-axial collection or mass lesion/mass effect. Cerebral volume loss, mainly perisylvian. Vascular: No hyperdense vessel or unexpected calcification. Skull: Normal. Negative for fracture or focal lesion. Sinuses/Orbits: Partial bilateral mastoid opacification, chronic and with negative nasopharynx. IMPRESSION: Acute left cerebral infarcts by MRI are occult. No hemorrhage or  progressive finding. Electronically Signed   By: Tiburcio Pea M.D.   On: 11/29/2023 06:27   CT ANGIO HEAD NECK W WO CM Result Date: 11/29/2023 CLINICAL DATA:  Acute stroke suspected EXAM: CT ANGIOGRAPHY HEAD AND NECK WITH AND WITHOUT CONTRAST TECHNIQUE: Multidetector CT imaging of the head and neck was performed using the standard protocol during bolus administration of intravenous contrast. Multiplanar CT image reconstructions and MIPs were obtained to evaluate the vascular anatomy. Carotid stenosis measurements (when applicable) are obtained utilizing NASCET criteria, using the distal internal carotid diameter as the denominator. RADIATION DOSE REDUCTION: This exam was performed according to the departmental dose-optimization program which includes automated exposure control, adjustment of the mA and/or kV according to patient size and/or use of iterative reconstruction technique. CONTRAST:  75mL OMNIPAQUE IOHEXOL 350 MG/ML SOLN COMPARISON:  Brain MRI from yesterday.  CTA of the neck 02/03/2021 FINDINGS: CT HEAD FINDINGS Brain: Infarct by prior brain MRI are occult by CT. No evidence of progressive infarct, hydrocephalus, mass, or hemorrhage. Vascular: No hyperdense vessel or unexpected calcification. Skull: Normal. Negative for fracture or focal lesion. Sinuses/Orbits: No acute finding. Review of the MIP images confirms the above findings CTA NECK FINDINGS Aortic arch: Atheromatous plaque without dissection or aneurysm. Two vessel branching. Right carotid system: Atheromatous wall thickening diffusely with mixed density plaque greatest at the bifurcation, proximal ICA stenosis measures nearly 50%. No ulceration or beading although notably tortuous ICA with loop. Left carotid system: Atheromatous wall thickening of the common carotid with fairly bulky mixed density plaque at the proximal ICA causing 60% stenosis measured on coronal reformats. Much of this plaque is low-density with some positive remodeling  suspected. No clear ulceration or beading. There is ICA tortuosity in the upper neck with loop. Vertebral arteries: Proximal subclavian atherosclerosis with advanced stenosis on the right measuring 80% on coronal reformats, measurements accentuated by poststenotic dilatation, progressed. Atheromatous plaque at both vertebral ostia with nonvisualized lumen on the left and likely also severe stenosis on the right  although visualization is complicated by motion artifact. Symmetrically enhancing vertebral arteries to the dura without beading or dissection. Skeleton: No acute finding Other neck: No acute finding Upper chest: Pulmonary fibrosis and partially covered right pleural fluid, reference recent chest CT. Review of the MIP images confirms the above findings CTA HEAD FINDINGS Anterior circulation: Atheromatous calcification of the cavernous carotids with mild to moderate right supraclinoid ICA stenosis. Extensive atheromatous irregularity of branch vessels. No branch occlusion, beading, or aneurysm. Posterior circulation: The right vertebral artery is dominant. Extensive atheromatous irregularity. No branch occlusion, beading, or aneurysm. Fetal type right PCA flow. Venous sinuses: Negative Anatomic variants: As above Review of the MIP images confirms the above findings IMPRESSION: 1. No emergent finding. 2. 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 3. Extensive intracranial atheromatous irregularity without correctable, proximal flow reducing stenosis. Electronically Signed   By: Tiburcio Pea M.D.   On: 11/29/2023 05:34   CT HUMERUS RIGHT WO CONTRAST Result Date: 11/28/2023 CLINICAL DATA:  Upper arm pain, history of recent fall EXAM: CT OF THE RIGHT HUMERUS WITHOUT CONTRAST TECHNIQUE: Multidetector CT imaging was performed according to the standard protocol. Multiplanar CT image reconstructions were also generated.  RADIATION DOSE REDUCTION: This exam was performed according to the departmental dose-optimization program which includes automated exposure control, adjustment of the mA and/or kV according to patient size and/or use of iterative reconstruction technique. COMPARISON:  Plain film from 11/26/2023 and CT from 07/18/2023. FINDINGS: Bones/Joint/Cartilage There is a new impaction fracture identified in the medial aspect of the humeral head which conforms to the shape of the posterior aspect of the glenoid. This is likely related to the recent injury as this was not seen on a prior CT from 07/18/2023. The remainder of the humerus is within normal limits. Visualized proximal radius and ulna are unremarkable. The glenoid and remainder of the scapula appear within normal limits. Ligaments Suboptimally assessed by CT. Muscles and Tendons Surrounding musculature appears within normal limits. Soft tissues No focal hematoma or other soft tissue abnormality is noted. IMPRESSION: Impaction fracture in the medial aspect of the humeral head which conforms to the shape of the adjacent glenoid likely related to the recent injury as this was not seen on prior exams. Electronically Signed   By: Alcide Clever M.D.   On: 11/28/2023 19:14   ECHOCARDIOGRAM COMPLETE Result Date: 11/28/2023    ECHOCARDIOGRAM REPORT   Patient Name:   MALIIK KARNER Date of Exam: 11/27/2023 Medical Rec #:  [id number]    Height:       69.0 in Accession #:    [id number]   Weight:       150.0 lb Date of Birth:  1961-03-02    BSA:          1.828 m Patient Age:    63 years     BP:           136/80 mmHg Patient Gender: M            HR:           104 bpm. Exam Location:  [facility] Procedure: 2D Echo, Cardiac Doppler and Color Doppler (Both Spectral and Color            Flow Doppler were utilized during procedure). Indications:     Elevated Troponin  History:         Patient has no prior history of Echocardiogram examinations.  PAD; Risk Factors:Hypertension,  Dyslipidemia and Current                  Smoker.  Sonographer:     Mikki Harbor Referring Phys:  [id number] Gillis Santa Diagnosing Phys: Marcina Millard MD  Sonographer Comments: Technically difficult study due to poor echo windows, suboptimal parasternal window and no subcostal window. Image acquisition challenging due to respiratory motion. IMPRESSIONS  1. Left ventricular ejection fraction, by estimation, is 35 to 40%. The left ventricle has moderately decreased function. The left ventricle has no regional wall motion abnormalities. Indeterminate diastolic filling due to E-A fusion.  2. Right ventricular systolic function is normal. The right ventricular size is normal. There is normal pulmonary artery systolic pressure.  3. The mitral valve is normal in structure. Moderate mitral valve regurgitation. No evidence of mitral stenosis.  4. The aortic valve is normal in structure. Aortic valve regurgitation is moderate. No aortic stenosis is present.  5. The inferior vena cava is normal in size with greater than 50% respiratory variability, suggesting right atrial pressure of 3 mmHg. FINDINGS  Left Ventricle: Left ventricular ejection fraction, by estimation, is 35 to 40%. The left ventricle has moderately decreased function. The left ventricle has no regional wall motion abnormalities. Strain imaging was not performed. The left ventricular internal cavity size was normal in size. There is no left ventricular hypertrophy. Indeterminate diastolic filling due to E-A fusion. Right Ventricle: The right ventricular size is normal. No increase in right ventricular wall thickness. Right ventricular systolic function is normal. There is normal pulmonary artery systolic pressure. The tricuspid regurgitant velocity is 1.66 m/s, and  with an assumed right atrial pressure of 3 mmHg, the estimated right ventricular systolic pressure is 14.0 mmHg. Left Atrium: Left atrial size was normal in size. Right Atrium: Right atrial  size was normal in size. Pericardium: There is no evidence of pericardial effusion. Mitral Valve: The mitral valve is normal in structure. Moderate mitral valve regurgitation. No evidence of mitral valve stenosis. MV peak gradient, 6.4 mmHg. The mean mitral valve gradient is 3.0 mmHg. Tricuspid Valve: The tricuspid valve is normal in structure. Tricuspid valve regurgitation is mild . No evidence of tricuspid stenosis. Aortic Valve: The aortic valve is normal in structure. Aortic valve regurgitation is moderate. Aortic regurgitation PHT measures 374 msec. No aortic stenosis is present. Aortic valve mean gradient measures 8.0 mmHg. Aortic valve peak gradient measures 19.9 mmHg. Aortic valve area, by VTI measures 2.26 cm. Pulmonic Valve: The pulmonic valve was normal in structure. Pulmonic valve regurgitation is not visualized. No evidence of pulmonic stenosis. Aorta: The aortic root is normal in size and structure. Venous: The inferior vena cava is normal in size with greater than 50% respiratory variability, suggesting right atrial pressure of 3 mmHg. IAS/Shunts: No atrial level shunt detected by color flow Doppler. Additional Comments: 3D imaging was not performed.  LEFT VENTRICLE PLAX 2D LVIDd:         4.30 cm      Diastology LVIDs:         2.70 cm      LV e' medial:    6.20 cm/s LV PW:         1.30 cm      LV E/e' medial:  20.0 LV IVS:        1.60 cm      LV e' lateral:   11.20 cm/s LVOT diam:     2.00 cm      LV E/e'  lateral: 11.1 LV SV:         72 LV SV Index:   39 LVOT Area:     3.14 cm  LV Volumes (MOD) LV vol d, MOD A2C: 111.0 ml LV vol d, MOD A4C: 136.0 ml LV vol s, MOD A2C: 63.3 ml LV vol s, MOD A4C: 91.9 ml LV SV MOD A2C:     47.7 ml LV SV MOD A4C:     136.0 ml LV SV MOD BP:      48.3 ml RIGHT VENTRICLE RV Basal diam:  3.80 cm RV Mid diam:    3.40 cm RV S prime:     15.90 cm/s TAPSE (M-mode): 2.0 cm LEFT ATRIUM             Index        RIGHT ATRIUM           Index LA diam:        4.40 cm 2.41 cm/m   RA  Area:     21.70 cm LA Vol (A2C):   72.4 ml 39.60 ml/m  RA Volume:   68.10 ml  37.25 ml/m LA Vol (A4C):   53.7 ml 29.37 ml/m LA Biplane Vol: 62.6 ml 34.24 ml/m  AORTIC VALVE                     PULMONIC VALVE AV Area (Vmax):    2.14 cm      PV Vmax:       1.23 m/s AV Area (Vmean):   2.14 cm      PV Peak grad:  6.1 mmHg AV Area (VTI):     2.26 cm AV Vmax:           223.00 cm/s AV Vmean:          124.000 cm/s AV VTI:            0.318 m AV Peak Grad:      19.9 mmHg AV Mean Grad:      8.0 mmHg LVOT Vmax:         152.00 cm/s LVOT Vmean:        84.300 cm/s LVOT VTI:          0.229 m LVOT/AV VTI ratio: 0.72 AI PHT:            374 msec  AORTA Ao Root diam: 3.90 cm MITRAL VALVE                TRICUSPID VALVE MV Area (PHT): 6.54 cm     TR Peak grad:   11.0 mmHg MV Area VTI:   2.70 cm     TR Vmax:        166.00 cm/s MV Peak grad:  6.4 mmHg MV Mean grad:  3.0 mmHg     SHUNTS MV Vmax:       1.26 m/s     Systemic VTI:  0.23 m MV Vmean:      74.9 cm/s    Systemic Diam: 2.00 cm MV Decel Time: 116 msec MV E velocity: 124.00 cm/s Marcina Millard MD Electronically signed by Marcina Millard MD Signature Date/Time: 11/28/2023/1:09:07 PM    Final    MR CERVICAL SPINE WO CONTRAST Result Date: 11/28/2023 CLINICAL DATA:  63 year old male with altered mental status, seizure. Found unresponsive. EXAM: MRI CERVICAL SPINE WITHOUT CONTRAST TECHNIQUE: Multiplanar, multisequence MR imaging of the cervical spine was performed. No intravenous contrast was administered. COMPARISON:  Brain MRI without and with  contrast the same day reported separately. Cervical spine CT 10/15/2009. FINDINGS: Alignment: Normal cervical lordosis. Vertebrae: Maintained vertebral body height. Normal background bone marrow signal. No marrow edema or evidence of acute osseous abnormality. Cord: Normal.  Capacious spinal canal at most levels. Posterior Fossa, vertebral arteries, paraspinal tissues: Cervicomedullary junction is within normal limits. Brain  is detailed separately today. Preserved major vascular flow voids in the visible neck. Negative visible neck soft tissues, grossly negative visible lung apices. Disc levels: C2-C3:  Minor disc bulging and facet hypertrophy.  No stenosis. C3-C4: Small right paracentral disc protrusion (series 12, image 8). Mild facet hypertrophy. Mild ligament flavum hypertrophy. No spinal stenosis. Mild to moderate left C4 foraminal stenosis due to combined disc osteophyte and facet disease. C4-C5: Mild mostly foraminal disc bulging and endplate spurring. Mild to moderate facet and ligament flavum hypertrophy. No spinal stenosis. Moderate to severe left, mild right C5 foraminal stenosis. C5-C6: Disc space loss. Circumferential disc osteophyte complex asymmetric to the left. Mild facet and ligament flavum hypertrophy. Borderline to mild spinal stenosis. Severe left and moderate to severe right C6 foraminal stenosis. C6-C7:  Mild disc bulging and facet hypertrophy.  No stenosis. C7-T1: Mild central disc bulge or protrusion. Mild facet hypertrophy. No spinal stenosis. Mild bilateral C8 neural foraminal stenosis. No visible upper thoracic spinal stenosis. IMPRESSION: 1. No acute osseous abnormality. Normal cervical spinal cord. 2. 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 Multilevel degenerative neural foraminal stenosis, up to severe at the left C5 and left greater than right C6 nerve levels. Otherwise mild to moderate. Electronically Signed   By: Odessa Fleming M.D.   On: 11/28/2023 04:12   MR BRAIN W WO CONTRAST Result Date: 11/28/2023 CLINICAL DATA:  63 year old male with altered mental status, seizure. Found unresponsive. EXAM: MRI HEAD WITHOUT AND WITH CONTRAST TECHNIQUE: Multiplanar, multiecho pulse sequences of the brain and surrounding structures were obtained without and with intravenous contrast. CONTRAST:  7mL GADAVIST GADOBUTROL 1 MMOL/ML IV SOLN COMPARISON:  Head CT 11/26/2023.  Cervical spine MRI the same day reported separately. FINDINGS: Brain: Several punctate foci of abnormal diffusion in the left hemisphere both the anterior MCA territory (series 8, image 37) and at the posterior left MCA/PCA watershed (image 29). Faint if any associated T2 and FLAIR hyperintensity. No abnormal enhancement identified. No right hemisphere or posterior fossa diffusion abnormality. No midline shift, mass effect, evidence of mass lesion, ventriculomegaly, extra-axial collection or acute intracranial hemorrhage. Mild for age scattered nonspecific cerebral white matter T2 and FLAIR hyperintensity, mostly subcortical. Solitary chronic microhemorrhage in the right parietal lobe series 12, image 42 on SWI. No cortical encephalomalacia identified. Deep gray matter nuclei, brainstem and cerebellum appear negative. Thin slice T2 images demonstrate hippocampal formations, mesial temporal lobes are symmetric and within normal limits. No abnormal enhancement identified. No dural thickening. Vascular: Major intracranial vascular flow voids are preserved. Following contrast the major dural venous sinuses are enhancing and appear to be patent. Skull and upper cervical spine: Negative for age visible cervical spine and normal background bone marrow signal. Sinuses/Orbits: Negative orbits. Mild bilateral paranasal sinus mucosal thickening. Other: Bilateral mastoid effusions. No associated enhancement or complicating features. Negative visible pharynx. Negative visible scalp and face. IMPRESSION: 1. 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 Associated hemorrhage or mass effect. 2. No other acute intracranial abnormality. Solitary chronic microhemorrhage and mild for age underlying white matter signal changes, most commonly due to chronic small vessel disease. 3. Bilateral  mastoid effusions, likely postinflammatory. No complicating features are identified. Electronically Signed   By:  Odessa Fleming M.D.   On: 11/28/2023 04:03   EEG adult Result Date: 11/27/2023 Charlsie Quest, MD     11/27/2023  1:04 PM Patient Name: RAIHAN KIMMEL MRN: [id number] Epilepsy Attending: Charlsie Quest Referring Physician/Provider: Hannah Beat, MD Date: 11/27/2023 Duration: 31.22 mins Patient history: 63yo M who presented to the emergency room with acute onset of tonic-clonic seizure witnessed by his family with postictal confusion and lack of memory about the event. Level of alertness: Awake AEDs during EEG study: None Technical aspects: This EEG study was done with scalp electrodes positioned according to the 10-20 International system of electrode placement. Electrical activity was reviewed with band pass filter of 1-70Hz , sensitivity of 7 uV/mm, display speed of 53mm/sec with a 60Hz  notched filter applied as appropriate. EEG data were recorded continuously and digitally stored.  Video monitoring was available and reviewed as appropriate. Description: The posterior dominant rhythm consists of 8 Hz activity of moderate voltage (25-35 uV) seen predominantly in posterior head regions, symmetric and reactive to eye opening and eye closing. Hyperventilation and photic stimulation were not performed.   IMPRESSION: This study is within normal limits. No seizures or epileptiform discharges were seen throughout the recording. A normal interictal EEG does not exclude the diagnosis of epilepsy. Charlsie Quest   CT Angio Chest PE W/Cm &/Or Wo Cm Result Date: 11/26/2023 CLINICAL DATA:  Pulmonary embolus suspected with high probability. Patient was found on the floor with vomit and unresponsive. EXAM: CT ANGIOGRAPHY CHEST WITH CONTRAST TECHNIQUE: Multidetector CT imaging of the chest was performed using the standard protocol during bolus administration of intravenous contrast. Multiplanar CT image reconstructions and MIPs were obtained to evaluate the vascular anatomy. RADIATION DOSE REDUCTION: This exam was performed  according to the departmental dose-optimization program which includes automated exposure control, adjustment of the mA and/or kV according to patient size and/or use of iterative reconstruction technique. CONTRAST:  75mL OMNIPAQUE IOHEXOL 350 MG/ML SOLN COMPARISON:  Chest CT 07/18/2023.  Chest radiograph 11/26/2023 FINDINGS: Cardiovascular: Technically adequate study with good opacification of the central and segmental pulmonary arteries. Mild motion artifact. No focal filling defects. No evidence of significant pulmonary embolus. Mild cardiac enlargement with left ventricular hypertrophy. Ascending thoracic aortic aneurysm measuring 4.2 cm diameter. Minimal scattered aortic and coronary artery calcifications. Mediastinum/Nodes: Esophagus is decompressed. Thyroid gland is unremarkable. Lymphadenopathy demonstrated in the hila and mediastinum. Largest right hilar nodes measure about 1.3 cm in diameter. Largest pretracheal lymph nodes measure about 1.3 cm diameter. Lungs/Pleura: Loculated appearing pleural collection in the right posterior lower chest. Severe emphysematous changes in the lungs. Mild peripheral fibrosis. Focal infiltrates in the right lung base posteriorly may represent pneumonia. Scattered pulmonary nodules, most prominent in the right apex, series 5, image 22, measuring 7 mm diameter. Small left pleural effusion with basilar atelectasis or infiltration. Upper Abdomen: No acute abnormalities. Diffuse atrophy of the left kidney. Musculoskeletal: Degenerative changes in the spine. No acute bony abnormalities. [place]-Sachs deformity of the right humerus. Review of the MIP images confirms the above findings. IMPRESSION: 1. No evidence of significant pulmonary embolus. 2. Cardiac enlargement with left ventricular hypertrophy. 3. Probable loculated collection in the right pleural space, possibly related to infection. Infiltrates in the left 4. Small left pleural effusion with basilar atelectasis. 5.  Diffuse emphysematous changes throughout the lungs with subpleural fibrosis. 6. Multiple pulmonary nodules. Most significant: Right solid pulmonary nodule within  the upper lobe measuring 7 mm. Per Fleischner Society Guidelines, recommend a non-contrast Chest CT at 3-6 months, then another non-contrast Chest CT at 18-24 months. These guidelines do not apply to immunocompromised patients and patients with cancer. Follow up in patients with significant comorbidities as clinically warranted. For lung cancer screening, adhere to Lung-RADS guidelines. Reference: Radiology. 2017; 284(1):228-43. 7. Hilar and mediastinal lymphadenopathy is nonspecific and could represent reactive change versus lymphoproliferative process. Similar appearance to previous study. 8. 4.2 cm diameter ascending thoracic aortic aneurysm. Recommend annual imaging followup by CTA or MRA. This recommendation follows 2010 ACCF/AHA/AATS/ACR/ASA/SCA/SCAI/SIR/STS/SVM Guidelines for the Diagnosis and Management of Patients with Thoracic Aortic Disease. Circulation. 2010; 121: W098-J191. Aortic aneurysm NOS (ICD10-I71.9) 9. Aortic atherosclerosis. Electronically Signed   By: Burman Nieves M.D.   On: 11/26/2023 19:58   DG Shoulder Right Result Date: 11/26/2023 CLINICAL DATA:  Larey Seat, pain EXAM: RIGHT SHOULDER - 2+ VIEW COMPARISON:  11/26/2023, 07/18/2023 FINDINGS: Internal rotation, external rotation, transscapular views of the right shoulder are obtained. No acute fracture, subluxation, or dislocation. Mild acromioclavicular and glenohumeral joint osteoarthritis. Soft tissue swelling overlying the right humeral head. Stable emphysema with small right pleural effusion versus pleural thickening. IMPRESSION: 1. Osteoarthritis.  No acute displaced fracture. 2. Emphysema, with stable right pleural thickening versus small pleural effusion. Electronically Signed   By: Sharlet Salina M.D.   On: 11/26/2023 18:48   CT Head Wo Contrast Result Date:  11/26/2023 CLINICAL DATA:  Mental status change of unknown cause. Unresponsive. Found on the floor with vomiting. EXAM: CT HEAD WITHOUT CONTRAST TECHNIQUE: Contiguous axial images were obtained from the base of the skull through the vertex without intravenous contrast. RADIATION DOSE REDUCTION: This exam was performed according to the departmental dose-optimization program which includes automated exposure control, adjustment of the mA and/or kV according to patient size and/or use of iterative reconstruction technique. COMPARISON:  None Available. FINDINGS: Brain: No evidence of acute infarction, hemorrhage, hydrocephalus, extra-axial collection or mass lesion/mass effect. Mild cerebral atrophy. Vascular: No hyperdense vessel or unexpected calcification. Skull: Normal. Negative for fracture or focal lesion. Sinuses/Orbits: Paranasal sinuses are clear. Bilateral mastoid effusions. Old nasal bone fracture deformities. Other: None. IMPRESSION: 1. No acute intracranial abnormalities. 2. Bilateral mastoid effusions. Electronically Signed   By: Burman Nieves M.D.   On: 11/26/2023 18:31   DG Chest Port 1 View Result Date: 11/26/2023 CLINICAL DATA:  Syncope.  Altered mental status. EXAM: PORTABLE CHEST 1 VIEW COMPARISON:  03/26/2023. FINDINGS: Low lung volume. Redemonstration of homogeneous opacity along bilateral lower lateral lung zones, which may represent pleural thickening versus loculated pleural effusions. Bilateral lung fields are otherwise clear. No dense consolidation or lung collapse. No frank pulmonary edema. Bilateral costophrenic angles are clear. Stable cardio-mediastinal silhouette. No acute osseous abnormalities. The soft tissues are within normal limits. IMPRESSION: No active disease. Electronically Signed   By: Jules Schick M.D.   On: 11/26/2023 18:01    Microbiology: Recent Results (from the past 240 hours)  Resp panel by RT-PCR (RSV, Flu A&B, Covid) Anterior Nasal Swab     Status: None    Collection Time: 11/26/23  5:14 PM   Specimen: Anterior Nasal Swab  Result Value Ref Range Status   SARS Coronavirus 2 by RT PCR NEGATIVE NEGATIVE Final    Comment: (NOTE) SARS-CoV-2 target nucleic acids are NOT DETECTED.  The SARS-CoV-2 RNA is generally detectable in upper respiratory specimens during the acute phase of infection. The lowest concentration of SARS-CoV-2 viral copies this assay can detect is  138 copies/mL. A negative result does not preclude SARS-Cov-2 infection and should not be used as the sole basis for treatment or other patient management decisions. A negative result may occur with  improper specimen collection/handling, submission of specimen other than nasopharyngeal swab, presence of viral mutation(s) within the areas targeted by this assay, and inadequate number of viral copies(<138 copies/mL). A negative result must be combined with clinical observations, patient history, and epidemiological information. The expected result is Negative.  Fact Sheet for Patients:  BloggerCourse.com  Fact Sheet for Healthcare Providers:  SeriousBroker.it  This test is no t yet approved or cleared by the [place] FDA and  has been authorized for detection and/or diagnosis of SARS-CoV-2 by FDA under an Emergency Use Authorization (EUA). This EUA will remain  in effect (meaning this test can be used) for the duration of the COVID-19 declaration under Section 564(b)(1) of the Act, 21 U.S.C.section 360bbb-3(b)(1), unless the authorization is terminated  or revoked sooner.       Influenza A by PCR NEGATIVE NEGATIVE Final   Influenza B by PCR NEGATIVE NEGATIVE Final    Comment: (NOTE) The Xpert Xpress SARS-CoV-2/FLU/RSV plus assay is intended as an aid in the diagnosis of influenza from Nasopharyngeal swab specimens and should not be used as a sole basis for treatment. Nasal washings and aspirates are unacceptable for  Xpert Xpress SARS-CoV-2/FLU/RSV testing.  Fact Sheet for Patients: BloggerCourse.com  Fact Sheet for Healthcare Providers: SeriousBroker.it  This test is not yet approved or cleared by the [place] FDA and has been authorized for detection and/or diagnosis of SARS-CoV-2 by FDA under an Emergency Use Authorization (EUA). This EUA will remain in effect (meaning this test can be used) for the duration of the COVID-19 declaration under Section 564(b)(1) of the Act, 21 U.S.C. section 360bbb-3(b)(1), unless the authorization is terminated or revoked.     Resp Syncytial Virus by PCR NEGATIVE NEGATIVE Final    Comment: (NOTE) Fact Sheet for Patients: BloggerCourse.com  Fact Sheet for Healthcare Providers: SeriousBroker.it  This test is not yet approved or cleared by the [place] FDA and has been authorized for detection and/or diagnosis of SARS-CoV-2 by FDA under an Emergency Use Authorization (EUA). This EUA will remain in effect (meaning this test can be used) for the duration of the COVID-19 declaration under Section 564(b)(1) of the Act, 21 U.S.C. section 360bbb-3(b)(1), unless the authorization is terminated or revoked.  Performed at [facility], [address] [place], [place] [address]   Resp panel by RT-PCR (RSV, Flu A&B, Covid) Anterior Nasal Swab     Status: None   Collection Time: 11/29/23  7:00 AM   Specimen: Anterior Nasal Swab  Result Value Ref Range Status   SARS Coronavirus 2 by RT PCR NEGATIVE NEGATIVE Final    Comment: (NOTE) SARS-CoV-2 target nucleic acids are NOT DETECTED.  The SARS-CoV-2 RNA is generally detectable in upper respiratory specimens during the acute phase of infection. The lowest concentration of SARS-CoV-2 viral copies this assay can detect is 138 copies/mL. A negative result does not preclude SARS-Cov-2 infection and  should not be used as the sole basis for treatment or other patient management decisions. A negative result may occur with  improper specimen collection/handling, submission of specimen other than nasopharyngeal swab, presence of viral mutation(s) within the areas targeted by this assay, and inadequate number of viral copies(<138 copies/mL). A negative result must be combined with clinical observations, patient history, and epidemiological information. The expected result is Negative.  Fact Sheet for Patients:  BloggerCourse.com  Fact Sheet for Healthcare Providers:  SeriousBroker.it  This test is no t yet approved or cleared by the [place] FDA and  has been authorized for detection and/or diagnosis of SARS-CoV-2 by FDA under an Emergency Use Authorization (EUA). This EUA will remain  in effect (meaning this test can be used) for the duration of the COVID-19 declaration under Section 564(b)(1) of the Act, 21 U.S.C.section 360bbb-3(b)(1), unless the authorization is terminated  or revoked sooner.       Influenza A by PCR NEGATIVE NEGATIVE Final   Influenza B by PCR NEGATIVE NEGATIVE Final    Comment: (NOTE) The Xpert Xpress SARS-CoV-2/FLU/RSV plus assay is intended as an aid in the diagnosis of influenza from Nasopharyngeal swab specimens and should not be used as a sole basis for treatment. Nasal washings and aspirates are unacceptable for Xpert Xpress SARS-CoV-2/FLU/RSV testing.  Fact Sheet for Patients: BloggerCourse.com  Fact Sheet for Healthcare Providers: SeriousBroker.it  This test is not yet approved or cleared by the [place] FDA and has been authorized for detection and/or diagnosis of SARS-CoV-2 by FDA under an Emergency Use Authorization (EUA). This EUA will remain in effect (meaning this test can be used) for the duration of the COVID-19 declaration  under Section 564(b)(1) of the Act, 21 U.S.C. section 360bbb-3(b)(1), unless the authorization is terminated or revoked.     Resp Syncytial Virus by PCR NEGATIVE NEGATIVE Final    Comment: (NOTE) Fact Sheet for Patients: BloggerCourse.com  Fact Sheet for Healthcare Providers: SeriousBroker.it  This test is not yet approved or cleared by the [place] FDA and has been authorized for detection and/or diagnosis of SARS-CoV-2 by FDA under an Emergency Use Authorization (EUA). This EUA will remain in effect (meaning this test can be used) for the duration of the COVID-19 declaration under Section 564(b)(1) of the Act, 21 U.S.C. section 360bbb-3(b)(1), unless the authorization is terminated or revoked.  Performed at [facility], [address] [place], [place] [address]   Culture, blood (Routine X 2) w Reflex to ID Panel     Status: None   Collection Time: 11/29/23  9:55 AM   Specimen: BLOOD  Result Value Ref Range Status   Specimen Description BLOOD BLOOD LEFT WRIST  Final   Special Requests   Final    BOTTLES DRAWN AEROBIC AND ANAEROBIC Blood Culture adequate volume   Culture   Final    NO GROWTH 5 DAYS Performed at [facility], [address] [place], [place] [address]    Report Status 12/04/2023 FINAL  Final  Culture, blood (Routine X 2) w Reflex to ID Panel     Status: None   Collection Time: 11/29/23  9:55 AM   Specimen: BLOOD  Result Value Ref Range Status   Specimen Description BLOOD BLOOD LEFT HAND  Final   Special Requests   Final    BOTTLES DRAWN AEROBIC AND ANAEROBIC Blood Culture adequate volume   Culture   Final    NO GROWTH 5 DAYS Performed at [facility], [address], [place], [place] [address]    Report Status 12/04/2023 FINAL  Final     Labs: CBC: Recent Labs  Lab 11/29/23 0456 11/30/23 0518 12/01/23 0216 12/02/23 0230 12/03/23 0434 12/04/23 0403  12/05/23 0451  WBC 8.9   < > 10.9* 8.5 9.1 11.6* 10.5  NEUTROABS 5.9  --   --   --   --   --   --  HGB 11.4*   < > 11.5* 11.6* 11.5* 11.1* 10.6*  HCT 34.7*   < > 34.8* 35.1* 34.3* 33.0* 31.6*  MCV 87.0   < > 87.0 86.9 86.4 85.3 85.9  PLT 176   < > 256 265 294 327 308   < > = values in this interval not displayed.   Basic Metabolic Panel: Recent Labs  Lab 11/29/23 0409 11/29/23 0456 11/30/23 0518 12/01/23 0216 12/02/23 0230 12/03/23 0434 12/04/23 0403 12/05/23 0451  NA  --    < > 140 138 137 138 138 140  K  --    < > 3.9 3.4* 3.6 3.6 4.4 3.5  CL  --    < > 111 109 108 107 108 109  CO2  --    < > 19* 20* 20* 22 20* 22  GLUCOSE  --    < > 82 123* 113* 99 122* 103*  BUN  --    < > 14 17 15 14 19 18   CREATININE  --    < > 0.91 0.95 0.93 0.89 0.83 0.81  CALCIUM  --    < > 8.6* 8.6* 8.6* 8.9 8.8* 8.9  MG 1.9  --  1.9 1.8 2.0  --   --  1.9  PHOS 3.3  --  3.3 2.9 3.6  --   --  2.7   < > = values in this interval not displayed.   Liver Function Tests: Recent Labs  Lab 11/29/23 0456  AST 35  ALT 26  ALKPHOS 56  BILITOT 0.9  PROT 5.8*  ALBUMIN 2.7*   No results for input(s): "LIPASE", "AMYLASE" in the last 168 hours. No results for input(s): "AMMONIA" in the last 168 hours. Cardiac Enzymes: Recent Labs  Lab 11/29/23 0409  CKTOTAL 318   BNP (last 3 results) No results for input(s): "BNP" in the last 8760 hours. CBG: Recent Labs  Lab 11/29/23 0400  GLUCAP 112*    Time spent: 35 minutes  Signed:  Gillis Santa  [facility] 12/05/2023 1:37 PM

## 2023-12-05 NOTE — Plan of Care (Signed)
  Problem: Fluid Volume: Goal: Hemodynamic stability will improve Outcome: Progressing   Problem: Respiratory: Goal: Ability to maintain adequate ventilation will improve Outcome: Progressing   Problem: Health Behavior/Discharge Planning: Goal: Ability to manage health-related needs will improve Outcome: Progressing   Problem: Clinical Measurements: Goal: Will remain free from infection Outcome: Progressing   Problem: Nutrition: Goal: Adequate nutrition will be maintained Outcome: Progressing

## 2024-01-06 ENCOUNTER — Ambulatory Visit: Payer: Medicare Other | Admitting: Nurse Practitioner

## 2024-01-07 ENCOUNTER — Other Ambulatory Visit: Payer: Self-pay | Admitting: Nurse Practitioner

## 2024-01-07 DIAGNOSIS — G4709 Other insomnia: Secondary | ICD-10-CM

## 2024-01-12 ENCOUNTER — Other Ambulatory Visit: Payer: Self-pay | Admitting: Nurse Practitioner

## 2024-01-12 DIAGNOSIS — G4709 Other insomnia: Secondary | ICD-10-CM

## 2024-01-15 ENCOUNTER — Other Ambulatory Visit: Payer: Self-pay | Admitting: Nurse Practitioner

## 2024-01-15 ENCOUNTER — Telehealth: Payer: Self-pay

## 2024-01-15 DIAGNOSIS — G4709 Other insomnia: Secondary | ICD-10-CM

## 2024-01-15 DIAGNOSIS — Z79899 Other long term (current) drug therapy: Secondary | ICD-10-CM

## 2024-01-15 NOTE — Telephone Encounter (Signed)
 Please review its D/c on med list

## 2024-01-16 ENCOUNTER — Other Ambulatory Visit: Payer: Self-pay | Admitting: Nurse Practitioner

## 2024-01-16 DIAGNOSIS — Z79899 Other long term (current) drug therapy: Secondary | ICD-10-CM

## 2024-01-20 MED ORDER — HYDROXYCHLOROQUINE SULFATE 200 MG PO TABS
ORAL_TABLET | ORAL | Status: AC
Start: 2024-01-20 — End: ?

## 2024-01-20 MED ORDER — LEFLUNOMIDE 20 MG PO TABS: 20.0000 mg | ORAL_TABLET | Freq: Every day | ORAL | Status: AC

## 2024-01-26 ENCOUNTER — Other Ambulatory Visit: Payer: Self-pay | Admitting: Nurse Practitioner

## 2024-02-01 ENCOUNTER — Other Ambulatory Visit: Payer: Self-pay | Admitting: Internal Medicine

## 2024-02-03 ENCOUNTER — Ambulatory Visit (INDEPENDENT_AMBULATORY_CARE_PROVIDER_SITE_OTHER): Admitting: Nurse Practitioner

## 2024-02-03 ENCOUNTER — Encounter: Payer: Self-pay | Admitting: Nurse Practitioner

## 2024-02-03 VITALS — BP 114/86 | HR 91 | Temp 97.7°F | Resp 16 | Ht 67.0 in | Wt 136.8 lb

## 2024-02-03 DIAGNOSIS — J432 Centrilobular emphysema: Secondary | ICD-10-CM

## 2024-02-03 DIAGNOSIS — F331 Major depressive disorder, recurrent, moderate: Secondary | ICD-10-CM

## 2024-02-03 DIAGNOSIS — R42 Dizziness and giddiness: Secondary | ICD-10-CM | POA: Diagnosis not present

## 2024-02-03 DIAGNOSIS — R569 Unspecified convulsions: Secondary | ICD-10-CM | POA: Diagnosis not present

## 2024-02-03 DIAGNOSIS — Z8673 Personal history of transient ischemic attack (TIA), and cerebral infarction without residual deficits: Secondary | ICD-10-CM

## 2024-02-03 DIAGNOSIS — I700 Atherosclerosis of aorta: Secondary | ICD-10-CM

## 2024-02-03 DIAGNOSIS — I10 Essential (primary) hypertension: Secondary | ICD-10-CM

## 2024-02-03 DIAGNOSIS — G4709 Other insomnia: Secondary | ICD-10-CM

## 2024-02-03 DIAGNOSIS — Z79899 Other long term (current) drug therapy: Secondary | ICD-10-CM

## 2024-02-03 MED ORDER — TAMSULOSIN HCL 0.4 MG PO CAPS: 0.4000 mg | ORAL_CAPSULE | Freq: Every day | ORAL | 3 refills | Status: AC

## 2024-02-03 MED ORDER — TRELEGY ELLIPTA 100-62.5-25 MCG/ACT IN AEPB: 1.0000 | INHALATION_SPRAY | Freq: Every day | RESPIRATORY_TRACT | 11 refills | Status: DC

## 2024-02-03 MED ORDER — MIRTAZAPINE 15 MG PO TABS: 15.0000 mg | ORAL_TABLET | Freq: Every day | ORAL | 2 refills | Status: AC

## 2024-02-03 MED ORDER — DULOXETINE HCL 30 MG PO CPEP: 30.0000 mg | ORAL_CAPSULE | Freq: Every day | ORAL | 1 refills | Status: DC

## 2024-02-03 MED ORDER — FUROSEMIDE 20 MG PO TABS: ORAL_TABLET | ORAL | 3 refills | Status: AC

## 2024-02-03 MED ORDER — MECLIZINE HCL 12.5 MG PO TABS: 12.5000 mg | ORAL_TABLET | Freq: Three times a day (TID) | ORAL | 3 refills | Status: AC | PRN

## 2024-02-03 MED ORDER — FOLIC ACID 1 MG PO TABS: 1.0000 mg | ORAL_TABLET | Freq: Every day | ORAL | 1 refills | Status: AC

## 2024-02-03 MED ORDER — POTASSIUM CHLORIDE ER 8 MEQ PO CPCR: ORAL_CAPSULE | ORAL | 3 refills | Status: AC

## 2024-02-03 MED ORDER — METOPROLOL TARTRATE 25 MG PO TABS: 25.0000 mg | ORAL_TABLET | Freq: Every day | ORAL | Status: DC | PRN

## 2024-02-03 MED ORDER — CLOPIDOGREL BISULFATE 75 MG PO TABS: 75.0000 mg | ORAL_TABLET | Freq: Every day | ORAL | 3 refills | Status: AC

## 2024-02-03 NOTE — Telephone Encounter (Signed)
 Please review

## 2024-02-03 NOTE — Progress Notes (Signed)
 [facility] [address] [place], [place] [address]  Internal MEDICINE  Office Visit Note  Patient Name: Jose Cuevas  [id number]  [id number]  Date of Service: 02/03/2024  Chief Complaint  Patient presents with   Hyperlipidemia   Hypertension   Follow-up    HPI Jose Cuevas presents for a follow-up visit for emphysema, hypertension, insomnia, depression, dizziness, seizure, history of stroke, and medication refills. Had a seizure and stroke at the same time and was hospitalized in February. Then went to SNF for rehab Insomnia/sundowners --taking mirtazapine   Seizures -- taking keppra  History of stroke -- on plavix  now and will need to see vascular surgery for carotid stenosis Dizziness -- takes meclizine  as needed.  Emphysema -- stable, denies any issues, taking trelegy daily.  Hypertension - blood pressure is controlled with current medications.    Current Medication: Outpatient Encounter Medications as of 02/03/2024  Medication Sig   acetaminophen  (TYLENOL ) 325 MG tablet Take 1-2 tablets (325-650 mg total) by mouth every 6 (six) hours as needed for mild pain (pain score 1-3) (or temp > 100.5).   albuterol  (VENTOLIN  HFA) 108 (90 Base) MCG/ACT inhaler Inhale 2 puffs into the lungs every 6 (six) hours as needed for wheezing or shortness of breath.   bisacodyl  (DULCOLAX) 10 MG suppository Place 1 suppository (10 mg total) rectally daily as needed for moderate constipation.   clonazePAM  (KLONOPIN ) 1 MG tablet TAKE 1 TABLET BY MOUTH AT BEDTIME AS NEEDED FOR ANXIETY.   hydroxychloroquine  (PLAQUENIL ) 200 MG tablet Take one tablet TWICE daily weekdays. Take one tablet ONCE daily weekends. Take with food.   leflunomide  (ARAVA ) 20 MG tablet Take 1 tablet (20 mg total) by mouth daily.   levETIRAcetam  (KEPPRA ) 500 MG tablet Take 1 tablet (500 mg total) by mouth 2 (two) times daily.   losartan  (COZAAR ) 25 MG tablet Take 1 tablet (25 mg total) by mouth daily. Hold if SBP <120    lubiprostone  (AMITIZA ) 24 MCG capsule Take 24 mcg by mouth daily.   metoCLOPramide  (REGLAN ) 10 MG tablet Take 1 tablet (10 mg total) by mouth 2 (two) times daily.   Nutritional Supplements (ENSURE ACTIVE HEART HEALTH) LIQD Take 1 each by mouth 3 (three) times daily as needed.   oxyCODONE  (OXY IR/ROXICODONE ) 5 MG immediate release tablet Take 1-2 tablets (5-10 mg total) by mouth every 4 (four) hours as needed for moderate pain (pain score 4-6) (pain score 4-6).   pantoprazole  (PROTONIX ) 40 MG tablet Take 1 tablet (40 mg total) by mouth 2 (two) times daily.   rosuvastatin  (CRESTOR ) 5 MG tablet TAKE 1 TABLET (5 MG TOTAL) BY MOUTH DAILY.   traZODone  (DESYREL ) 50 MG tablet Take 1 tablet (50 mg total) by mouth at bedtime as needed for sleep.   vitamin B-12 (CYANOCOBALAMIN ) 1000 MCG tablet Take 1,000 mcg by mouth daily.   Vitamin D , Ergocalciferol , (DRISDOL ) 1.25 MG (50000 UNIT) CAPS capsule Take 1 capsule (50,000 Units total) by mouth every 7 (seven) days.   [DISCONTINUED] clopidogrel  (PLAVIX ) 75 MG tablet Take 1 tablet (75 mg total) by mouth daily.   [DISCONTINUED] DULoxetine  (CYMBALTA ) 30 MG capsule Take 1 capsule (30 mg total) by mouth at bedtime.   [DISCONTINUED] Fluticasone-Umeclidin-Vilant (TRELEGY ELLIPTA ) 100-62.5-25 MCG/ACT AEPB Inhale 1 puff into the lungs daily.   [DISCONTINUED] folic acid  (FOLVITE ) 1 MG tablet TAKE 1 TABLET BY MOUTH EVERY DAY FOR SUPPLEMENT   [DISCONTINUED] furosemide  (LASIX ) 20 MG tablet Take one tab po M/W/F (Patient taking differently: Take 20 mg by mouth  daily. Take one tab po M/W/F)   [DISCONTINUED] metoprolol  tartrate (LOPRESSOR ) 25 MG tablet Take 1 tablet (25 mg total) by mouth 2 (two) times daily. Hold if SBP <110 mmHg and or HR <65   [DISCONTINUED] mirtazapine  (REMERON ) 15 MG tablet Take 1 tablet (15 mg total) by mouth at bedtime.   [DISCONTINUED] Potassium Chloride  CR (MICRO-K ) 8 MEQ CPCR capsule CR TAKE ONE TAB PO M/W/F WITH LASIX    [DISCONTINUED] tamsulosin   (FLOMAX ) 0.4 MG CAPS capsule Take 1 capsule (0.4 mg total) by mouth daily.   clopidogrel  (PLAVIX ) 75 MG tablet Take 1 tablet (75 mg total) by mouth daily.   DULoxetine  (CYMBALTA ) 30 MG capsule Take 1 capsule (30 mg total) by mouth at bedtime.   Fluticasone-Umeclidin-Vilant (TRELEGY ELLIPTA ) 100-62.5-25 MCG/ACT AEPB Inhale 1 puff into the lungs daily.   folic acid  (FOLVITE ) 1 MG tablet Take 1 tablet (1 mg total) by mouth daily.   furosemide  (LASIX ) 20 MG tablet Take one tab po M/W/F   meclizine  (ANTIVERT ) 12.5 MG tablet Take 1 tablet (12.5 mg total) by mouth 3 (three) times daily as needed for dizziness.   metoprolol  tartrate (LOPRESSOR ) 25 MG tablet Take 1 tablet (25 mg total) by mouth daily as needed. Hold if SBP <110 mmHg and or HR <65   mirtazapine  (REMERON ) 15 MG tablet Take 1 tablet (15 mg total) by mouth at bedtime.   Potassium Chloride  CR (MICRO-K ) 8 MEQ CPCR capsule CR TAKE ONE TAB PO M/W/F WITH LASIX    tamsulosin  (FLOMAX ) 0.4 MG CAPS capsule Take 1 capsule (0.4 mg total) by mouth daily.   No facility-administered encounter medications on file as of 02/03/2024.    Surgical History: Past Surgical History:  Procedure Laterality Date   BACK SURGERY     COLONOSCOPY WITH PROPOFOL  N/A 11/11/2015   Procedure: COLONOSCOPY WITH PROPOFOL ;  Surgeon: Luella Sager, MD;  Location: [facility] ENDOSCOPY;  Service: Endoscopy;  Laterality: N/A;   COLONOSCOPY WITH PROPOFOL  N/A 12/02/2015   Procedure: COLONOSCOPY WITH PROPOFOL ;  Surgeon: Luella Sager, MD;  Location: [facility] ENDOSCOPY;  Service: Endoscopy;  Laterality: N/A;   COLONOSCOPY WITH PROPOFOL  N/A 09/16/2019   Procedure: COLONOSCOPY WITH PROPOFOL ;  Surgeon: Toledo, Alphonsus Jeans, MD;  Location: [facility] ENDOSCOPY;  Service: Gastroenterology;  Laterality: N/A;   ESOPHAGOGASTRODUODENOSCOPY (EGD) WITH PROPOFOL  N/A 11/11/2015   Procedure: ESOPHAGOGASTRODUODENOSCOPY (EGD) WITH PROPOFOL ;  Surgeon: Luella Sager, MD;  Location: [facility] ENDOSCOPY;  Service:  Endoscopy;  Laterality: N/A;   ESOPHAGOGASTRODUODENOSCOPY (EGD) WITH PROPOFOL  N/A 12/02/2015   Procedure: ESOPHAGOGASTRODUODENOSCOPY (EGD) WITH PROPOFOL ;  Surgeon: Luella Sager, MD;  Location: [facility] ENDOSCOPY;  Service: Endoscopy;  Laterality: N/A;   ESOPHAGOGASTRODUODENOSCOPY (EGD) WITH PROPOFOL  N/A 01/20/2016   Procedure: ESOPHAGOGASTRODUODENOSCOPY (EGD) WITH PROPOFOL ;  Surgeon: Luella Sager, MD;  Location: [facility] ENDOSCOPY;  Service: Endoscopy;  Laterality: N/A;   ESOPHAGOGASTRODUODENOSCOPY (EGD) WITH PROPOFOL  N/A 09/16/2019   Procedure: ESOPHAGOGASTRODUODENOSCOPY (EGD) WITH PROPOFOL ;  Surgeon: Toledo, Alphonsus Jeans, MD;  Location: [facility] ENDOSCOPY;  Service: Gastroenterology;  Laterality: N/A;   KNEE RECONSTRUCTION Left    REVERSE SHOULDER ARTHROPLASTY Right 12/03/2023   Procedure: REVERSE SHOULDER ARTHROPLASTY;  Surgeon: Lorri Rota, MD;  Location: [facility] ORS;  Service: Orthopedics;  Laterality: Right;    Medical History: Past Medical History:  Diagnosis Date   Back pain    Collagen vascular disease (HCC)    Rhematoid Arthritis hx.   Constipation, chronic    DDD (degenerative disc disease), cervical    History of duodenal ulcer    History of kidney  stones    Hyperlipidemia    Hypertension    Low kidney function    LT Kidney non-functioning   Nondiabetic gastroparesis    Peripheral blood vessel disorder (HCC)    Rheumatoid arthritis with rheumatoid factor (HCC)    Sleep apnea    Weight loss     Family History: Family History  Problem Relation Age of Onset   Diabetes Mother    Diabetes Father    Heart attack Maternal Grandmother    COPD Brother        08/2020   Tourette syndrome Son        in prison 2019   Learning disabilities Son     Social History   Socioeconomic History   Marital status: Married    Spouse name: Not on file   Number of children: Not on file   Years of education: Not on file   Highest education level: Not on file  Occupational History   Not  on file  Tobacco Use   Smoking status: Former    Types: Cigars   Smokeless tobacco: Never   Tobacco comments:    Occasional  3 a day. Stopped since January   Vaping Use   Vaping status: Never Used  Substance and Sexual Activity   Alcohol  use: No   Drug use: No   Sexual activity: Yes    Birth control/protection: None  Other Topics Concern   Not on file  Social History Narrative   Not on file   Social Drivers of Health   Financial Resource Strain: Low Risk  (01/01/2024)   [facility]   Overall Financial Resource Strain (CARDIA)    Difficulty of Paying Living Expenses: Not very hard  Food Insecurity: No Food Insecurity (01/01/2024)   Received from [facility]   Hunger Vital Sign    Worried About Running Out of Food in the Last Year: Never true    Ran Out of Food in the Last Year: Never true  Transportation Needs: No Transportation Needs (01/01/2024)   Received from [facility] - Transportation    In the past 12 months, has lack of transportation kept you from medical appointments or from getting medications?: No    Lack of Transportation (Non-Medical): No  Physical Activity: Not on file  Stress: Not on file  Social Connections: Socially Isolated (11/27/2023)   Social Connection and Isolation Panel [NHANES]    Frequency of Communication with Friends and Family: Once a week    Frequency of Social Gatherings with Friends and Family: Once a week    Attends Religious Services: Never    Database administrator or Organizations: No    Attends Banker Meetings: Never    Marital Status: Married  Catering manager Violence: Not At Risk (11/27/2023)   Humiliation, Afraid, Rape, and Kick questionnaire    Fear of Current or Ex-Partner: No    Emotionally Abused: No    Physically Abused: No    Sexually Abused: No      Review of Systems  Constitutional:  Positive for fatigue. Negative for chills and  fever.  HENT: Negative.  Negative for congestion, mouth sores and postnasal drip.   Respiratory:  Positive for shortness of breath (intermittent). Negative for cough, chest tightness and wheezing.   Cardiovascular: Negative.  Negative for chest pain and palpitations.  Gastrointestinal: Negative.   Genitourinary:  Negative for flank pain.  Musculoskeletal:  Positive for  arthralgias and back pain.  Neurological:  Positive for dizziness and weakness.  Psychiatric/Behavioral:  Positive for behavioral problems and sleep disturbance. Negative for self-injury and suicidal ideas. The patient is nervous/anxious.     Vital Signs: BP 114/86   Pulse 91   Temp 97.7 F (36.5 C)   Resp 16   Ht 5\' 7"  (1.702 m)   Wt 136 lb 12.8 oz (62.1 kg)   SpO2 96%   BMI 21.43 kg/m    Physical Exam Vitals reviewed.  Constitutional:      General: He is not in acute distress.    Appearance: Normal appearance. He is normal weight. He is not ill-appearing.  HENT:     Head: Normocephalic and atraumatic.  Eyes:     Pupils: Pupils are equal, round, and reactive to light.  Cardiovascular:     Rate and Rhythm: Normal rate and regular rhythm.     Heart sounds: Normal heart sounds. No murmur heard. Pulmonary:     Effort: Pulmonary effort is normal. No respiratory distress.     Breath sounds: Normal breath sounds. No wheezing.  Skin:    General: Skin is warm and dry.     Capillary Refill: Capillary refill takes less than 2 seconds.  Neurological:     Mental Status: He is alert and oriented to person, place, and time.     Motor: Weakness present.  Psychiatric:        Mood and Affect: Mood normal.        Behavior: Behavior normal.        Assessment/Plan: 1. Centrilobular emphysema (HCC) (Primary) Continue trelegy as prescribed.  - Fluticasone-Umeclidin-Vilant (TRELEGY ELLIPTA ) 100-62.5-25 MCG/ACT AEPB; Inhale 1 puff into the lungs daily.  Dispense: 1 each; Refill: 11  2. Seizure (HCC) Continue keppra   as prescribed. Seeing neurology  3. Dizziness Continue prn meclizine  as prescribed.  - meclizine  (ANTIVERT ) 12.5 MG tablet; Take 1 tablet (12.5 mg total) by mouth 3 (three) times daily as needed for dizziness.  Dispense: 90 tablet; Refill: 3  4. Essential hypertension Stable, continue furosemide  and metoprolol  as prescribed.  - furosemide  (LASIX ) 20 MG tablet; Take one tab po M/W/F  Dispense: 36 tablet; Refill: 3 - metoprolol  tartrate (LOPRESSOR ) 25 MG tablet; Take 1 tablet (25 mg total) by mouth daily as needed. Hold if SBP <110 mmHg and or HR <65  5. Aortic atherosclerosis (HCC) Continue plavix  as prescribed  - clopidogrel  (PLAVIX ) 75 MG tablet; Take 1 tablet (75 mg total) by mouth daily.  Dispense: 90 tablet; Refill: 3  6. Other insomnia Continue mirtazapine  as prescribed  - mirtazapine  (REMERON ) 15 MG tablet; Take 1 tablet (15 mg total) by mouth at bedtime.  Dispense: 90 tablet; Refill: 2  7. History of stroke Seeing neurology   8. Encounter for medication review Medication list reviewed, updated and refills ordered  - tamsulosin  (FLOMAX ) 0.4 MG CAPS capsule; Take 1 capsule (0.4 mg total) by mouth daily.  Dispense: 90 capsule; Refill: 3 - folic acid  (FOLVITE ) 1 MG tablet; Take 1 tablet (1 mg total) by mouth daily.  Dispense: 90 tablet; Refill: 1 - Potassium Chloride  CR (MICRO-K ) 8 MEQ CPCR capsule CR; TAKE ONE TAB PO M/W/F WITH LASIX   Dispense: 36 capsule; Refill: 3  9. Moderate episode of recurrent major depressive disorder (HCC) Continue duloxetine  and mirtazapine  as prescribed.  - DULoxetine  (CYMBALTA ) 30 MG capsule; Take 1 capsule (30 mg total) by mouth at bedtime.  Dispense: 90 capsule; Refill: 1 - mirtazapine  (REMERON ) 15 MG  tablet; Take 1 tablet (15 mg total) by mouth at bedtime.  Dispense: 90 tablet; Refill: 2   General Counseling: Jose Cuevas verbalizes understanding of the findings of todays visit and agrees with plan of treatment. I have discussed any further diagnostic  evaluation that may be needed or ordered today. We also reviewed his medications today. he has been encouraged to call the office with any questions or concerns that should arise related to todays visit.    No orders of the defined types were placed in this encounter.   Meds ordered this encounter  Medications   tamsulosin  (FLOMAX ) 0.4 MG CAPS capsule    Sig: Take 1 capsule (0.4 mg total) by mouth daily.    Dispense:  90 capsule    Refill:  3   folic acid  (FOLVITE ) 1 MG tablet    Sig: Take 1 tablet (1 mg total) by mouth daily.    Dispense:  90 tablet    Refill:  1    Fill for 90 days   DULoxetine  (CYMBALTA ) 30 MG capsule    Sig: Take 1 capsule (30 mg total) by mouth at bedtime.    Dispense:  90 capsule    Refill:  1    For future refills   mirtazapine  (REMERON ) 15 MG tablet    Sig: Take 1 tablet (15 mg total) by mouth at bedtime.    Dispense:  90 tablet    Refill:  2    Note increased dose and # of tablets. Please discontinue previous orders for this medication.   clopidogrel  (PLAVIX ) 75 MG tablet    Sig: Take 1 tablet (75 mg total) by mouth daily.    Dispense:  90 tablet    Refill:  3    For future refills   Potassium Chloride  CR (MICRO-K ) 8 MEQ CPCR capsule CR    Sig: TAKE ONE TAB PO M/W/F WITH LASIX     Dispense:  36 capsule    Refill:  3   furosemide  (LASIX ) 20 MG tablet    Sig: Take one tab po M/W/F    Dispense:  36 tablet    Refill:  3   Fluticasone-Umeclidin-Vilant (TRELEGY ELLIPTA ) 100-62.5-25 MCG/ACT AEPB    Sig: Inhale 1 puff into the lungs daily.    Dispense:  1 each    Refill:  11   metoprolol  tartrate (LOPRESSOR ) 25 MG tablet    Sig: Take 1 tablet (25 mg total) by mouth daily as needed. Hold if SBP <110 mmHg and or HR <65    ON HOLD   meclizine  (ANTIVERT ) 12.5 MG tablet    Sig: Take 1 tablet (12.5 mg total) by mouth 3 (three) times daily as needed for dizziness.    Dispense:  90 tablet    Refill:  3    Fill new script today    Return in about 2  months (around 04/08/2024) for F/U, anxiety med refill, Johnson Arizola PCP.   Total time spent:30 Minutes Time spent includes review of chart, medications, test results, and follow up plan with the patient.   [place] Controlled Substance Database was reviewed by me.  This patient was seen by Laurence Pons, FNP-C in collaboration with Dr. Verneta Gone as a part of collaborative care agreement.   Natacia Chaisson R. Bobbi Burow, MSN, FNP-C Internal medicine

## 2024-02-04 NOTE — Telephone Encounter (Signed)
 done

## 2024-02-18 ENCOUNTER — Other Ambulatory Visit (INDEPENDENT_AMBULATORY_CARE_PROVIDER_SITE_OTHER): Payer: Self-pay | Admitting: Nurse Practitioner

## 2024-02-18 ENCOUNTER — Encounter (INDEPENDENT_AMBULATORY_CARE_PROVIDER_SITE_OTHER): Payer: Self-pay

## 2024-02-18 DIAGNOSIS — I6523 Occlusion and stenosis of bilateral carotid arteries: Secondary | ICD-10-CM

## 2024-02-20 ENCOUNTER — Ambulatory Visit (INDEPENDENT_AMBULATORY_CARE_PROVIDER_SITE_OTHER): Payer: Self-pay

## 2024-02-20 ENCOUNTER — Ambulatory Visit (INDEPENDENT_AMBULATORY_CARE_PROVIDER_SITE_OTHER): Payer: Self-pay | Admitting: Nurse Practitioner

## 2024-02-20 ENCOUNTER — Encounter (INDEPENDENT_AMBULATORY_CARE_PROVIDER_SITE_OTHER): Payer: Self-pay | Admitting: Nurse Practitioner

## 2024-02-20 VITALS — BP 122/75 | HR 80 | Resp 18 | Ht 67.0 in | Wt 137.2 lb

## 2024-02-20 DIAGNOSIS — E785 Hyperlipidemia, unspecified: Secondary | ICD-10-CM

## 2024-02-20 DIAGNOSIS — I6523 Occlusion and stenosis of bilateral carotid arteries: Secondary | ICD-10-CM

## 2024-02-20 DIAGNOSIS — I6522 Occlusion and stenosis of left carotid artery: Secondary | ICD-10-CM

## 2024-02-20 DIAGNOSIS — I10 Essential (primary) hypertension: Secondary | ICD-10-CM

## 2024-02-24 NOTE — Progress Notes (Signed)
 Subjective:    Patient ID: Jose Cuevas, male    DOB: 03-12-1961, 63 y.o.   MRN: [id number] Chief Complaint  Patient presents with   Follow-up    np. carotid + consult. Occlusion and stenosis of left carotid artery. potter, zachary.    The patient is seen for evaluation of carotid stenosis. The carotid stenosis was identified after CT scan diagnosed carotid stenosis following a left MCA stroke on 11/26/2023.  The patient denies amaurosis fugax.  The patient is recovering well post stroke thus far.  There is no history of migraine headaches. There is a history of seizures and he is currently on Keppra .  The patient is taking enteric-coated aspirin  81 mg daily.  No recent shortening of the patient's walking distance or new symptoms consistent with claudication.  No history of rest pain symptoms. No new ulcers or wounds of the lower extremities have occurred.  There is no history of DVT, PE or superficial thrombophlebitis. No recent episodes of angina or shortness of breath documented.   His CT scan indicated approximately greater than 60% stenosis.  Individual evaluation by myself and Dr. Prescilla Brod noted its likely about 75%.  This is consistent with the carotid duplex the patient had done today.  His velocities indicate a 60 to 79% stenosis in the left ICA.  He also has hemodynamically significant plaque of greater than 50% noted in the common carotid artery.  There is also significant amount of plaque when visualized and notes about a 75% stenosis.  The CT scan also noted significant stenosis of the bilateral vertebral arteries as well as a significant right subclavian stenosis however ultrasound notes that the left subclavian is stenotic versus the right.  Both of the vertebral arteries have antegrade flow today.     Review of Systems  Musculoskeletal:  Positive for arthralgias.  Neurological:  Positive for weakness.  All other systems reviewed and are negative.      Objective:     Physical Exam Vitals reviewed.  HENT:     Head: Normocephalic.  Cardiovascular:     Rate and Rhythm: Normal rate.     Pulses: Normal pulses.  Pulmonary:     Effort: Pulmonary effort is normal.  Skin:    General: Skin is warm and dry.  Neurological:     Mental Status: He is alert and oriented to person, place, and time.  Psychiatric:        Mood and Affect: Mood normal.        Behavior: Behavior normal.        Thought Content: Thought content normal.        Judgment: Judgment normal.     BP 122/75   Pulse 80   Resp 18   Ht 5\' 7"  (1.702 m)   Wt 137 lb 3.4 oz (62.2 kg)   BMI 21.49 kg/m   Past Medical History:  Diagnosis Date   Back pain    Collagen vascular disease (HCC)    Rhematoid Arthritis hx.   Constipation, chronic    DDD (degenerative disc disease), cervical    History of duodenal ulcer    History of kidney stones    Hyperlipidemia    Hypertension    Low kidney function    LT Kidney non-functioning   Nondiabetic gastroparesis    Peripheral blood vessel disorder (HCC)    Rheumatoid arthritis with rheumatoid factor (HCC)    Sleep apnea    Weight loss     Social History  Socioeconomic History   Marital status: Married    Spouse name: Not on file   Number of children: Not on file   Years of education: Not on file   Highest education level: Not on file  Occupational History   Not on file  Tobacco Use   Smoking status: Former    Types: Cigars   Smokeless tobacco: Never   Tobacco comments:    Occasional  3 a day. Stopped since January   Vaping Use   Vaping status: Never Used  Substance and Sexual Activity   Alcohol  use: No   Drug use: No   Sexual activity: Yes    Birth control/protection: None  Other Topics Concern   Not on file  Social History Narrative   Not on file   Social Drivers of Health   Financial Resource Strain: Low Risk  (01/01/2024)   Received from [facility]   Overall Financial Resource Strain (CARDIA)     Difficulty of Paying Living Expenses: Not very hard  Food Insecurity: No Food Insecurity (01/01/2024)   Received [facility]   Hunger Vital Sign    Worried About Running Out of Food in the Last Year: Never true    Ran Out of Food in the Last Year: Never true  Transportation Needs: No Transportation Needs (01/01/2024)   Received from [facility] - Transportation    In the past 12 months, has lack of transportation kept you from medical appointments or from getting medications?: No    Lack of Transportation (Non-Medical): No  Physical Activity: Not on file  Stress: Not on file  Social Connections: Socially Isolated (11/27/2023)   Social Connection and Isolation Panel [NHANES]    Frequency of Communication with Friends and Family: Once a week    Frequency of Social Gatherings with Friends and Family: Once a week    Attends Religious Services: Never    Database administrator or Organizations: No    Attends Banker Meetings: Never    Marital Status: Married  Catering manager Violence: Not At Risk (11/27/2023)   Humiliation, Afraid, Rape, and Kick questionnaire    Fear of Current or Ex-Partner: No    Emotionally Abused: No    Physically Abused: No    Sexually Abused: No    Past Surgical History:  Procedure Laterality Date   BACK SURGERY     COLONOSCOPY WITH PROPOFOL  N/A 11/11/2015   Procedure: COLONOSCOPY WITH PROPOFOL ;  Surgeon: Luella Sager, MD;  Location: [facility] ENDOSCOPY;  Service: Endoscopy;  Laterality: N/A;   COLONOSCOPY WITH PROPOFOL  N/A 12/02/2015   Procedure: COLONOSCOPY WITH PROPOFOL ;  Surgeon: Luella Sager, MD;  Location: [facility] ENDOSCOPY;  Service: Endoscopy;  Laterality: N/A;   COLONOSCOPY WITH PROPOFOL  N/A 09/16/2019   Procedure: COLONOSCOPY WITH PROPOFOL ;  Surgeon: Toledo, Alphonsus Jeans, MD;  Location: [facility] ENDOSCOPY;  Service: Gastroenterology;  Laterality: N/A;   ESOPHAGOGASTRODUODENOSCOPY (EGD) WITH  PROPOFOL  N/A 11/11/2015   Procedure: ESOPHAGOGASTRODUODENOSCOPY (EGD) WITH PROPOFOL ;  Surgeon: Luella Sager, MD;  Location: [facility] ENDOSCOPY;  Service: Endoscopy;  Laterality: N/A;   ESOPHAGOGASTRODUODENOSCOPY (EGD) WITH PROPOFOL  N/A 12/02/2015   Procedure: ESOPHAGOGASTRODUODENOSCOPY (EGD) WITH PROPOFOL ;  Surgeon: Luella Sager, MD;  Location: [facility] ENDOSCOPY;  Service: Endoscopy;  Laterality: N/A;   ESOPHAGOGASTRODUODENOSCOPY (EGD) WITH PROPOFOL  N/A 01/20/2016   Procedure: ESOPHAGOGASTRODUODENOSCOPY (EGD) WITH PROPOFOL ;  Surgeon: Luella Sager, MD;  Location: [facility] ENDOSCOPY;  Service: Endoscopy;  Laterality: N/A;  ESOPHAGOGASTRODUODENOSCOPY (EGD) WITH PROPOFOL  N/A 09/16/2019   Procedure: ESOPHAGOGASTRODUODENOSCOPY (EGD) WITH PROPOFOL ;  Surgeon: Toledo, Alphonsus Jeans, MD;  Location: [facility] ENDOSCOPY;  Service: Gastroenterology;  Laterality: N/A;   KNEE RECONSTRUCTION Left    REVERSE SHOULDER ARTHROPLASTY Right 12/03/2023   Procedure: REVERSE SHOULDER ARTHROPLASTY;  Surgeon: Lorri Rota, MD;  Location: [facility] ORS;  Service: Orthopedics;  Laterality: Right;    Family History  Problem Relation Age of Onset   Diabetes Mother    Diabetes Father    Heart attack Maternal Grandmother    COPD Brother        08/2020   Tourette syndrome Son        in prison 2019   Learning disabilities Son     No Known Allergies     Latest Ref Rng & Units 12/05/2023    4:51 AM 12/04/2023    4:03 AM 12/03/2023    4:34 AM  CBC  WBC 4.0 - 10.5 K/uL 10.5  11.6  9.1   Hemoglobin 13.0 - 17.0 g/dL 91.4  78.2  95.6   Hematocrit 39.0 - 52.0 % 31.6  33.0  34.3   Platelets 150 - 400 K/uL 308  327  294        CMP     Component Value Date/Time   NA 140 12/05/2023 0451   NA 143 07/12/2023 1152   NA 138 01/08/2014 1158   K 3.5 12/05/2023 0451   K 3.5 01/08/2014 1158   CL 109 12/05/2023 0451   CL 110 (H) 01/08/2014 1158   CO2 22 12/05/2023 0451   CO2 23 01/08/2014 1158   GLUCOSE 103 (H) 12/05/2023 0451    GLUCOSE 173 (H) 01/08/2014 1158   BUN 18 12/05/2023 0451   BUN 14 07/12/2023 1152   BUN 20 (H) 01/08/2014 1158   CREATININE 0.81 12/05/2023 0451   CREATININE 1.23 01/08/2014 1158   CALCIUM  8.9 12/05/2023 0451   CALCIUM  8.5 01/08/2014 1158   PROT 5.8 (L) 11/29/2023 0456   PROT 5.7 (L) 07/12/2023 1152   PROT 6.7 01/08/2014 1158   ALBUMIN 2.7 (L) 11/29/2023 0456   ALBUMIN 3.7 (L) 07/12/2023 1152   ALBUMIN 3.2 (L) 01/08/2014 1158   AST 35 11/29/2023 0456   AST 17 01/08/2014 1158   ALT 26 11/29/2023 0456   ALT 15 01/08/2014 1158   ALKPHOS 56 11/29/2023 0456   ALKPHOS 101 01/08/2014 1158   BILITOT 0.9 11/29/2023 0456   BILITOT 0.4 07/12/2023 1152   BILITOT 0.2 01/08/2014 1158   EGFR 79 07/12/2023 1152   GFRNONAA >60 12/05/2023 0451   GFRNONAA >60 01/08/2014 1158     No results found.     Assessment & Plan:   1. Left carotid artery stenosis (Primary) Recommend:  The patient is symptomatic with respect to the carotid stenosis.  The patient now has progressed and has a lesion the is >70%.  I have completed Shared Decision-Making with Coda Mathey Payneprior to carotid surgery/intervention. The conversation included: -Discussion of all treatment options including carotid endarterectomy (CEA), carotid artery stenting (which includes transcarotid artery revascularization (TCAR)), and optimal medical therapy (OMT). -Explanation of risks and benefits for each option specific to Hrithik Boschee Stanko's clinical situation. -Integration of clinical guidelines as it relates to the patient's history and comorbidities. -Discussion and incorporation of ZEYAD DELAGUILA and their personal preferences and priorities in choosing a treatment plan plan   Patient's CT angiography of the carotid arteries confirms >70% left ICA stenosis.  The anatomical considerations support stenting  over surgery.  This was discussed in detail with the patient.  The risks, benefits and alternative therapies were reviewed in detail  with the patient.  All questions were answered.  The patient agrees to proceed with stenting of the left carotid artery.  The patient's NIHSS score is as follows: 1 Mild: 1 - 5 Mild to Moderately Severe: 5 - 14 Severe: 15 - 24 Very Severe: >25  Continue antiplatelet therapy as prescribed. Continue management of CAD, HTN and Hyperlipidemia. Healthy heart diet, encouraged exercise at least 4 times per week.   2. Essential hypertension Continue antihypertensive medications as already ordered, these medications have been reviewed and there are no changes at this time.  3. Dyslipidemia Continue statin as ordered and reviewed, no changes at this time   Current Outpatient Medications on File Prior to Visit  Medication Sig Dispense Refill   acetaminophen  (TYLENOL ) 325 MG tablet Take 1-2 tablets (325-650 mg total) by mouth every 6 (six) hours as needed for mild pain (pain score 1-3) (or temp > 100.5).     albuterol  (VENTOLIN  HFA) 108 (90 Base) MCG/ACT inhaler Inhale 2 puffs into the lungs every 6 (six) hours as needed for wheezing or shortness of breath. 8 g 0   bisacodyl  (DULCOLAX) 10 MG suppository Place 1 suppository (10 mg total) rectally daily as needed for moderate constipation.     clonazePAM  (KLONOPIN ) 1 MG tablet TAKE 1 TABLET BY MOUTH AT BEDTIME AS NEEDED FOR ANXIETY. 30 tablet 2   clopidogrel  (PLAVIX ) 75 MG tablet Take 1 tablet (75 mg total) by mouth daily. 90 tablet 3   DULoxetine  (CYMBALTA ) 30 MG capsule Take 1 capsule (30 mg total) by mouth at bedtime. 90 capsule 1   Fluticasone-Umeclidin-Vilant (TRELEGY ELLIPTA ) 100-62.5-25 MCG/ACT AEPB Inhale 1 puff into the lungs daily. 1 each 11   folic acid  (FOLVITE ) 1 MG tablet Take 1 tablet (1 mg total) by mouth daily. 90 tablet 1   furosemide  (LASIX ) 20 MG tablet Take one tab po M/W/F 36 tablet 3   hydroxychloroquine  (PLAQUENIL ) 200 MG tablet Take one tablet TWICE daily weekdays. Take one tablet ONCE daily weekends. Take with food.      leflunomide  (ARAVA ) 20 MG tablet Take 1 tablet (20 mg total) by mouth daily.     levETIRAcetam  (KEPPRA ) 500 MG tablet Take 1 tablet (500 mg total) by mouth 2 (two) times daily.     losartan  (COZAAR ) 25 MG tablet Take 1 tablet (25 mg total) by mouth daily. Hold if SBP <120     lubiprostone  (AMITIZA ) 24 MCG capsule Take 24 mcg by mouth daily.     meclizine  (ANTIVERT ) 12.5 MG tablet Take 1 tablet (12.5 mg total) by mouth 3 (three) times daily as needed for dizziness. 90 tablet 3   metoCLOPramide  (REGLAN ) 10 MG tablet Take 1 tablet (10 mg total) by mouth 2 (two) times daily. 180 tablet 2   metoprolol  tartrate (LOPRESSOR ) 25 MG tablet Take 1 tablet (25 mg total) by mouth daily as needed. Hold if SBP <110 mmHg and or HR <65     mirtazapine  (REMERON ) 15 MG tablet Take 1 tablet (15 mg total) by mouth at bedtime. 90 tablet 2   Nutritional Supplements (ENSURE ACTIVE HEART HEALTH) LIQD Take 1 each by mouth 3 (three) times daily as needed.     oxyCODONE  (OXY IR/ROXICODONE ) 5 MG immediate release tablet Take 1-2 tablets (5-10 mg total) by mouth every 4 (four) hours as needed for moderate pain (pain score 4-6) (pain score  4-6). 30 tablet 0   pantoprazole  (PROTONIX ) 40 MG tablet Take 1 tablet (40 mg total) by mouth 2 (two) times daily. 90 tablet 1   Potassium Chloride  CR (MICRO-K ) 8 MEQ CPCR capsule CR TAKE ONE TAB PO M/W/F WITH LASIX  36 capsule 3   rosuvastatin  (CRESTOR ) 5 MG tablet TAKE 1 TABLET (5 MG TOTAL) BY MOUTH DAILY. 90 tablet 1   tamsulosin  (FLOMAX ) 0.4 MG CAPS capsule Take 1 capsule (0.4 mg total) by mouth daily. 90 capsule 3   traZODone  (DESYREL ) 50 MG tablet Take 1 tablet (50 mg total) by mouth at bedtime as needed for sleep.     vitamin B-12 (CYANOCOBALAMIN ) 1000 MCG tablet Take 1,000 mcg by mouth daily.     Vitamin D , Ergocalciferol , (DRISDOL ) 1.25 MG (50000 UNIT) CAPS capsule Take 1 capsule (50,000 Units total) by mouth every 7 (seven) days.     No current facility-administered medications on file  prior to visit.    There are no Patient Instructions on file for this visit. No follow-ups on file.   Lanny Donoso E Navjot Loera, NP

## 2024-02-29 ENCOUNTER — Encounter: Payer: Self-pay | Admitting: Nurse Practitioner

## 2024-02-29 DIAGNOSIS — Z8673 Personal history of transient ischemic attack (TIA), and cerebral infarction without residual deficits: Secondary | ICD-10-CM | POA: Insufficient documentation

## 2024-03-05 ENCOUNTER — Telehealth (INDEPENDENT_AMBULATORY_CARE_PROVIDER_SITE_OTHER): Payer: Self-pay

## 2024-03-05 NOTE — Telephone Encounter (Signed)
 I attempted to contact the patient to schedule him for a left carotid stent with Dr. Prescilla Brod. A message was left for a return call.

## 2024-03-09 ENCOUNTER — Telehealth (INDEPENDENT_AMBULATORY_CARE_PROVIDER_SITE_OTHER): Payer: Self-pay

## 2024-03-09 NOTE — Telephone Encounter (Signed)
 I returned a call to the patient's spouse and he has been scheduled for a left carotid stent placement with Dr. Prescilla Brod on 03/31/24 with a 6:45 am arrival time to the [facility]. Pre-procedure instructions were discussed and will be sent to Mychart and mailed.

## 2024-03-31 ENCOUNTER — Encounter
Admission: RE | Disposition: A | Discharge status: Home / Self Care | Payer: Self-pay | Source: Home / Self Care | Attending: Vascular Surgery

## 2024-03-31 ENCOUNTER — Other Ambulatory Visit: Payer: Self-pay

## 2024-03-31 ENCOUNTER — Encounter: Payer: Self-pay | Admitting: Vascular Surgery

## 2024-03-31 ENCOUNTER — Inpatient Hospital Stay
Admission: RE | Admit: 2024-03-31 | Discharge: 2024-04-01 | DRG: 035 | Disposition: A | Discharge status: Home / Self Care | Attending: Vascular Surgery | Admitting: Vascular Surgery

## 2024-03-31 DIAGNOSIS — I700 Atherosclerosis of aorta: Secondary | ICD-10-CM

## 2024-03-31 DIAGNOSIS — G4730 Sleep apnea, unspecified: Secondary | ICD-10-CM | POA: Diagnosis present

## 2024-03-31 DIAGNOSIS — I63239 Cerebral infarction due to unspecified occlusion or stenosis of unspecified carotid arteries: Principal | ICD-10-CM | POA: Diagnosis present

## 2024-03-31 DIAGNOSIS — R0989 Other specified symptoms and signs involving the circulatory and respiratory systems: Secondary | ICD-10-CM | POA: Diagnosis present

## 2024-03-31 DIAGNOSIS — Z79899 Other long term (current) drug therapy: Secondary | ICD-10-CM

## 2024-03-31 DIAGNOSIS — Z833 Family history of diabetes mellitus: Secondary | ICD-10-CM

## 2024-03-31 DIAGNOSIS — E785 Hyperlipidemia, unspecified: Secondary | ICD-10-CM | POA: Diagnosis present

## 2024-03-31 DIAGNOSIS — Z7982 Long term (current) use of aspirin: Secondary | ICD-10-CM | POA: Diagnosis not present

## 2024-03-31 DIAGNOSIS — Z7902 Long term (current) use of antithrombotics/antiplatelets: Secondary | ICD-10-CM

## 2024-03-31 DIAGNOSIS — I6522 Occlusion and stenosis of left carotid artery: Principal | ICD-10-CM

## 2024-03-31 DIAGNOSIS — Z8711 Personal history of peptic ulcer disease: Secondary | ICD-10-CM

## 2024-03-31 DIAGNOSIS — R569 Unspecified convulsions: Secondary | ICD-10-CM | POA: Diagnosis present

## 2024-03-31 DIAGNOSIS — I772 Rupture of artery: Secondary | ICD-10-CM | POA: Diagnosis present

## 2024-03-31 DIAGNOSIS — Z8673 Personal history of transient ischemic attack (TIA), and cerebral infarction without residual deficits: Secondary | ICD-10-CM

## 2024-03-31 DIAGNOSIS — Z96611 Presence of right artificial shoulder joint: Secondary | ICD-10-CM | POA: Diagnosis present

## 2024-03-31 DIAGNOSIS — K5909 Other constipation: Secondary | ICD-10-CM | POA: Diagnosis present

## 2024-03-31 DIAGNOSIS — M069 Rheumatoid arthritis, unspecified: Secondary | ICD-10-CM | POA: Diagnosis present

## 2024-03-31 DIAGNOSIS — Z87891 Personal history of nicotine dependence: Secondary | ICD-10-CM | POA: Diagnosis not present

## 2024-03-31 DIAGNOSIS — I2510 Atherosclerotic heart disease of native coronary artery without angina pectoris: Secondary | ICD-10-CM | POA: Diagnosis present

## 2024-03-31 DIAGNOSIS — Z8249 Family history of ischemic heart disease and other diseases of the circulatory system: Secondary | ICD-10-CM | POA: Diagnosis not present

## 2024-03-31 DIAGNOSIS — I708 Atherosclerosis of other arteries: Secondary | ICD-10-CM | POA: Diagnosis present

## 2024-03-31 DIAGNOSIS — M059 Rheumatoid arthritis with rheumatoid factor, unspecified: Secondary | ICD-10-CM | POA: Diagnosis present

## 2024-03-31 DIAGNOSIS — I10 Essential (primary) hypertension: Secondary | ICD-10-CM | POA: Diagnosis present

## 2024-03-31 DIAGNOSIS — Z87442 Personal history of urinary calculi: Secondary | ICD-10-CM | POA: Diagnosis not present

## 2024-03-31 HISTORY — PX: CAROTID PTA/STENT INTERVENTION: CATH118231

## 2024-03-31 LAB — POCT ACTIVATED CLOTTING TIME: Activated Clotting Time: 291 s

## 2024-03-31 LAB — CREATININE, SERUM
Creatinine, Ser: 1.1 mg/dL (ref 0.61–1.24)
GFR, Estimated: >60 mL/min (ref >60)

## 2024-03-31 LAB — GLUCOSE, CAPILLARY: Glucose-Capillary: 73 mg/dL (ref 70–99)

## 2024-03-31 LAB — MRSA NEXT GEN BY PCR, NASAL: MRSA by PCR Next Gen: NOT DETECTED

## 2024-03-31 LAB — BUN: BUN: 12 mg/dL (ref 8–23)

## 2024-03-31 SURGERY — CAROTID PTA/STENT INTERVENTION
Anesthesia: Moderate Sedation | Laterality: Left

## 2024-03-31 MED ORDER — HEPARIN SODIUM (PORCINE) 1000 UNIT/ML IJ SOLN
INTRAMUSCULAR | Status: AC
Start: 2024-03-31 — End: 2024-03-31
  Filled 2024-03-31: qty 10

## 2024-03-31 MED ORDER — POTASSIUM CHLORIDE CRYS ER 20 MEQ PO TBCR: 40.0000 meq | EXTENDED_RELEASE_TABLET | Freq: Every day | ORAL | Status: DC | PRN

## 2024-03-31 MED ORDER — SODIUM CHLORIDE 0.9 % IV SOLN: INTRAVENOUS | Status: DC

## 2024-03-31 MED ORDER — DULOXETINE HCL 30 MG PO CPEP
30.0000 mg | ORAL_CAPSULE | Freq: Every day | ORAL | Status: DC
  Administered 2024-03-31: 30 mg via ORAL
  Filled 2024-03-31: qty 1

## 2024-03-31 MED ORDER — HEPARIN SODIUM (PORCINE) 1000 UNIT/ML IJ SOLN
INTRAMUSCULAR | Status: AC
  Filled 2024-03-31: qty 10

## 2024-03-31 MED ORDER — DIPHENHYDRAMINE HCL 50 MG/ML IJ SOLN: 50.0000 mg | Freq: Once | INTRAMUSCULAR | Status: DC | PRN

## 2024-03-31 MED ORDER — HEPARIN (PORCINE) IN NACL 1000-0.9 UT/500ML-% IV SOLN
INTRAVENOUS | Status: DC | PRN
  Administered 2024-03-31: 2000 mL

## 2024-03-31 MED ORDER — SORBITOL 70 % SOLN: 30.0000 mL | Freq: Every day | Status: DC | PRN

## 2024-03-31 MED ORDER — ONDANSETRON HCL 4 MG/2ML IJ SOLN: 4.0000 mg | Freq: Four times a day (QID) | INTRAMUSCULAR | Status: DC | PRN

## 2024-03-31 MED ORDER — SODIUM CHLORIDE 0.9 % IV SOLN: 500.0000 mL | Freq: Once | INTRAVENOUS | Status: DC | PRN

## 2024-03-31 MED ORDER — HYDRALAZINE HCL 20 MG/ML IJ SOLN: 5.0000 mg | INTRAMUSCULAR | Status: DC | PRN

## 2024-03-31 MED ORDER — CHLORHEXIDINE GLUCONATE CLOTH 2 % EX PADS
6.0000 | MEDICATED_PAD | Freq: Every day | CUTANEOUS | Status: DC
  Administered 2024-03-31: 6 via TOPICAL

## 2024-03-31 MED ORDER — MIDAZOLAM HCL 2 MG/ML PO SYRP: 8.0000 mg | ORAL_SOLUTION | Freq: Once | ORAL | Status: DC | PRN

## 2024-03-31 MED ORDER — FENTANYL CITRATE (PF) 100 MCG/2ML IJ SOLN
INTRAMUSCULAR | Status: DC | PRN
  Administered 2024-03-31 (×2): 25 ug via INTRAVENOUS

## 2024-03-31 MED ORDER — ACETAMINOPHEN 325 MG PO TABS: 325.0000 mg | ORAL_TABLET | ORAL | Status: DC | PRN

## 2024-03-31 MED ORDER — FUROSEMIDE 20 MG PO TABS
20.0000 mg | ORAL_TABLET | Freq: Every day | ORAL | Status: DC
  Filled 2024-03-31: qty 1

## 2024-03-31 MED ORDER — ASPIRIN 81 MG PO TBEC
81.0000 mg | DELAYED_RELEASE_TABLET | Freq: Every day | ORAL | Status: DC
  Administered 2024-04-01: 81 mg via ORAL
  Filled 2024-03-31: qty 1

## 2024-03-31 MED ORDER — CEFAZOLIN SODIUM-DEXTROSE 2-4 GM/100ML-% IV SOLN
INTRAVENOUS | Status: AC
Start: 2024-03-31 — End: 2024-03-31
  Filled 2024-03-31: qty 100

## 2024-03-31 MED ORDER — LEFLUNOMIDE 10 MG PO TABS
20.0000 mg | ORAL_TABLET | Freq: Every day | ORAL | Status: DC
  Administered 2024-04-01: 20 mg via ORAL
  Filled 2024-03-31: qty 1
  Filled 2024-03-31: qty 2

## 2024-03-31 MED ORDER — LOSARTAN POTASSIUM 25 MG PO TABS
25.0000 mg | ORAL_TABLET | Freq: Every day | ORAL | Status: DC
  Filled 2024-03-31: qty 1

## 2024-03-31 MED ORDER — VITAMIN B-12 1000 MCG PO TABS
1000.0000 ug | ORAL_TABLET | Freq: Every day | ORAL | Status: DC
  Administered 2024-04-01: 1000 ug via ORAL
  Filled 2024-03-31: qty 1

## 2024-03-31 MED ORDER — IODIXANOL 320 MG/ML IV SOLN
INTRAVENOUS | Status: DC | PRN
  Administered 2024-03-31: 40 mL via INTRA_ARTERIAL

## 2024-03-31 MED ORDER — MECLIZINE HCL 25 MG PO TABS: 12.5000 mg | ORAL_TABLET | Freq: Three times a day (TID) | ORAL | Status: DC | PRN

## 2024-03-31 MED ORDER — POLYETHYLENE GLYCOL 3350 17 G PO PACK: 17.0000 g | PACK | Freq: Every day | ORAL | Status: DC | PRN

## 2024-03-31 MED ORDER — ROSUVASTATIN CALCIUM 5 MG PO TABS
5.0000 mg | ORAL_TABLET | Freq: Every day | ORAL | Status: DC
  Administered 2024-03-31 – 2024-04-01 (×2): 5 mg via ORAL
  Filled 2024-03-31 (×2): qty 1

## 2024-03-31 MED ORDER — LABETALOL HCL 5 MG/ML IV SOLN: 10.0000 mg | INTRAVENOUS | Status: DC | PRN

## 2024-03-31 MED ORDER — LIDOCAINE HCL (PF) 1 % IJ SOLN
INTRAMUSCULAR | Status: DC | PRN
  Administered 2024-03-31: 10 mL via INTRADERMAL

## 2024-03-31 MED ORDER — CLOPIDOGREL BISULFATE 75 MG PO TABS
75.0000 mg | ORAL_TABLET | Freq: Every day | ORAL | Status: DC
  Administered 2024-04-01: 75 mg via ORAL
  Filled 2024-03-31: qty 1

## 2024-03-31 MED ORDER — PANTOPRAZOLE SODIUM 40 MG PO TBEC
40.0000 mg | DELAYED_RELEASE_TABLET | Freq: Two times a day (BID) | ORAL | Status: DC
  Administered 2024-03-31 – 2024-04-01 (×2): 40 mg via ORAL
  Filled 2024-03-31 (×2): qty 1

## 2024-03-31 MED ORDER — ALBUTEROL SULFATE (2.5 MG/3ML) 0.083% IN NEBU: 3.0000 mL | INHALATION_SOLUTION | Freq: Four times a day (QID) | RESPIRATORY_TRACT | Status: DC | PRN

## 2024-03-31 MED ORDER — HYDROMORPHONE HCL 1 MG/ML IJ SOLN: 1.0000 mg | Freq: Once | INTRAMUSCULAR | Status: DC | PRN

## 2024-03-31 MED ORDER — MIDAZOLAM HCL 5 MG/5ML IJ SOLN
INTRAMUSCULAR | Status: AC
  Filled 2024-03-31: qty 5

## 2024-03-31 MED ORDER — METOPROLOL TARTRATE 5 MG/5ML IV SOLN: 2.5000 mg | INTRAVENOUS | Status: DC | PRN

## 2024-03-31 MED ORDER — CHLORHEXIDINE GLUCONATE CLOTH 2 % EX PADS: 6.0000 | MEDICATED_PAD | Freq: Every day | CUTANEOUS | Status: DC

## 2024-03-31 MED ORDER — SODIUM CHLORIDE 0.9 % IV SOLN
INTRAVENOUS | Status: AC | PRN
  Administered 2024-03-31: 500 mL/h via INTRAVENOUS

## 2024-03-31 MED ORDER — FENTANYL CITRATE (PF) 100 MCG/2ML IJ SOLN
INTRAMUSCULAR | Status: AC
  Filled 2024-03-31: qty 2

## 2024-03-31 MED ORDER — METOCLOPRAMIDE HCL 10 MG PO TABS
10.0000 mg | ORAL_TABLET | Freq: Two times a day (BID) | ORAL | Status: DC
  Administered 2024-03-31 – 2024-04-01 (×2): 10 mg via ORAL
  Filled 2024-03-31 (×2): qty 1

## 2024-03-31 MED ORDER — FOLIC ACID 1 MG PO TABS
1.0000 mg | ORAL_TABLET | Freq: Every day | ORAL | Status: DC
  Administered 2024-04-01: 1 mg via ORAL
  Filled 2024-03-31: qty 1

## 2024-03-31 MED ORDER — MIDAZOLAM HCL 2 MG/2ML IJ SOLN
INTRAMUSCULAR | Status: DC | PRN
  Administered 2024-03-31 (×3): .5 mg via INTRAVENOUS

## 2024-03-31 MED ORDER — FUROSEMIDE 20 MG PO TABS
20.0000 mg | ORAL_TABLET | ORAL | Status: DC
  Administered 2024-04-01: 20 mg via ORAL
  Filled 2024-03-31: qty 1

## 2024-03-31 MED ORDER — CEFAZOLIN SODIUM-DEXTROSE 2-4 GM/100ML-% IV SOLN
2.0000 g | INTRAVENOUS | Status: AC
  Administered 2024-03-31: 2 g via INTRAVENOUS

## 2024-03-31 MED ORDER — ACETAMINOPHEN 325 MG RE SUPP: 325.0000 mg | RECTAL | Status: DC | PRN

## 2024-03-31 MED ORDER — POTASSIUM CHLORIDE CRYS ER 20 MEQ PO TBCR
10.0000 meq | EXTENDED_RELEASE_TABLET | ORAL | Status: DC
  Administered 2024-04-01: 10 meq via ORAL
  Filled 2024-03-31: qty 1

## 2024-03-31 MED ORDER — BUDESON-GLYCOPYRROL-FORMOTEROL 160-9-4.8 MCG/ACT IN AERO
2.0000 | INHALATION_SPRAY | Freq: Two times a day (BID) | RESPIRATORY_TRACT | Status: DC
  Administered 2024-03-31 – 2024-04-01 (×2): 2 via RESPIRATORY_TRACT
  Filled 2024-03-31: qty 5.9

## 2024-03-31 MED ORDER — TAMSULOSIN HCL 0.4 MG PO CAPS
0.4000 mg | ORAL_CAPSULE | Freq: Every day | ORAL | Status: DC
  Administered 2024-04-01: 0.4 mg via ORAL
  Filled 2024-03-31: qty 1

## 2024-03-31 MED ORDER — PHENYLEPHRINE HCL-NACL 20-0.9 MG/250ML-% IV SOLN
INTRAVENOUS | Status: AC
  Filled 2024-03-31: qty 250

## 2024-03-31 MED ORDER — PHENOL 1.4 % MT LIQD: 1.0000 | OROMUCOSAL | Status: DC | PRN

## 2024-03-31 MED ORDER — HEPARIN SODIUM (PORCINE) 1000 UNIT/ML IJ SOLN
INTRAMUSCULAR | Status: DC | PRN
  Administered 2024-03-31: 8000 [IU] via INTRAVENOUS
  Administered 2024-03-31: 1000 [IU] via INTRAVENOUS

## 2024-03-31 MED ORDER — FAMOTIDINE 20 MG PO TABS: 40.0000 mg | ORAL_TABLET | Freq: Once | ORAL | Status: DC | PRN

## 2024-03-31 MED ORDER — METHYLPREDNISOLONE SODIUM SUCC 125 MG IJ SOLR: 125.0000 mg | Freq: Once | INTRAMUSCULAR | Status: DC | PRN

## 2024-03-31 MED ORDER — OXYCODONE HCL 5 MG PO TABS
5.0000 mg | ORAL_TABLET | ORAL | Status: DC | PRN
  Administered 2024-03-31: 10 mg via ORAL
  Administered 2024-03-31: 5 mg via ORAL
  Administered 2024-04-01 (×2): 10 mg via ORAL
  Filled 2024-03-31: qty 1
  Filled 2024-03-31 (×3): qty 2

## 2024-03-31 MED ORDER — PHENYLEPHRINE 80 MCG/ML (10ML) SYRINGE FOR IV PUSH (FOR BLOOD PRESSURE SUPPORT)
PREFILLED_SYRINGE | INTRAVENOUS | Status: AC
  Filled 2024-03-31: qty 10

## 2024-03-31 MED ORDER — CEFAZOLIN SODIUM-DEXTROSE 2-4 GM/100ML-% IV SOLN
2.0000 g | Freq: Three times a day (TID) | INTRAVENOUS | Status: AC
  Administered 2024-03-31 – 2024-04-01 (×2): 2 g via INTRAVENOUS
  Filled 2024-03-31 (×3): qty 100

## 2024-03-31 MED ORDER — MORPHINE SULFATE (PF) 4 MG/ML IV SOLN: 2.0000 mg | INTRAVENOUS | Status: DC | PRN

## 2024-03-31 MED ORDER — ATROPINE SULFATE 1 MG/10ML IJ SOSY
PREFILLED_SYRINGE | INTRAMUSCULAR | Status: AC
  Filled 2024-03-31: qty 20

## 2024-03-31 MED ORDER — DOCUSATE SODIUM 100 MG PO CAPS
100.0000 mg | ORAL_CAPSULE | Freq: Every day | ORAL | Status: DC
  Administered 2024-04-01: 100 mg via ORAL
  Filled 2024-03-31: qty 1

## 2024-03-31 SURGICAL SUPPLY — 22 items
BALLOON VIATRAC 5X30X135 (BALLOONS) IMPLANT
CATH ANGIO 5F PIGTAIL 100CM (CATHETERS) IMPLANT
CATH ANGIO 5F SIM1 100CM (CATHETERS) IMPLANT
CATH BEACON 5 .038 100 VERT TP (CATHETERS) IMPLANT
DEVICE EMBOSHIELD NAV6 4.0-7.0 (FILTER) IMPLANT
DEVICE PRESTO INFLATION (MISCELLANEOUS) IMPLANT
DEVICE STARCLOSE SE CLOSURE (Vascular Products) IMPLANT
DEVICE TORQUE (MISCELLANEOUS) IMPLANT
GLIDEWIRE ANGLED SS 035X260CM (WIRE) IMPLANT
GUIDEWIRE VASC STIFF .038X260 (WIRE) IMPLANT
KIT CAROTID MANIFOLD (MISCELLANEOUS) IMPLANT
NDL ENTRY 21GA 7CM ECHOTIP (NEEDLE) IMPLANT
NEEDLE ENTRY 21GA 7CM ECHOTIP (NEEDLE) ×1 IMPLANT
PACK ANGIOGRAPHY (CUSTOM PROCEDURE TRAY) ×1 IMPLANT
SET INTRO CAPELLA COAXIAL (SET/KITS/TRAYS/PACK) IMPLANT
SHEATH BRITE TIP 6FRX11 (SHEATH) IMPLANT
SHEATH SHUTTLE 6FRX80 (SHEATH) IMPLANT
STENT XACT CAR 9-7X40X136 (Permanent Stent) IMPLANT
SUT MNCRL AB 4-0 PS2 18 (SUTURE) IMPLANT
SYR MEDRAD MARK 7 150ML (SYRINGE) IMPLANT
TUBING CONTRAST HIGH PRESS 72 (TUBING) IMPLANT
WIRE J 3MM .035X145CM (WIRE) IMPLANT

## 2024-03-31 NOTE — Interval H&P Note (Signed)
 History and Physical Interval Note:  03/31/2024 8:15 AM  Jose Cuevas  has presented today for surgery, with the diagnosis of L Carotid Stent   ABBOTT    L Carotid Stenosis.  The various methods of treatment have been discussed with the patient and family. After consideration of risks, benefits and other options for treatment, the patient has consented to  Procedure(s): CAROTID PTA/STENT INTERVENTION (Left) as a surgical intervention.  The patient's history has been reviewed, patient examined, no change in status, stable for surgery.  I have reviewed the patient's chart and labs.  Questions were answered to the patient's satisfaction.     Cordella Shawl

## 2024-03-31 NOTE — Op Note (Signed)
 OPERATIVE NOTE DATE: 03/31/2024  PROCEDURE:  Ultrasound guidance for vascular access right common femoral artery  Placement of a 9 x 7 x 40 exact stent with the use of the NAV-6 embolic protection device in the left internal carotid artery  PRE-OPERATIVE DIAGNOSIS: 1.  Symptomatic critical left internal carotid artery stenosis with a large ulceration. 2.  Recent left hemispheric stroke  POST-OPERATIVE DIAGNOSIS:  Same as above  SURGEON: Cordella Shawl  ASSISTANT(S): None  ANESTHESIA: local/MCS  ESTIMATED BLOOD LOSS: 100 cc  CONTRAST: 40 cc  FLUORO TIME: 10.8 minutes  MODERATE CONSCIOUS SEDATION TIME: Continuous ECG pulse oximetry and cardiopulmonary monitoring was performed throughout the entire procedure by the interventional radiology nurse total sedation time was 58  FINDING(S): 1.   Large ulcerated area with a lesion of approximately 80% left internal carotid artery stenosis  SPECIMEN(S):   none  INDICATIONS:   Patient is a 63 y.o. male who presents with symptomatic critical left internal carotid artery stenosis associated with a large ulcerated area.  The patient has multiple comorbidities and a recent left hemispheric stroke and carotid artery stenting was felt to be preferred to endarterectomy for that reason.  Risks and benefits were discussed and informed consent was obtained.   DESCRIPTION: After obtaining full informed written consent, the patient was brought back to the vascular suite and placed supine upon the table.  The patient received IV antibiotics prior to induction. Moderate conscious sedation was administered during a face to face encounter with the patient throughout the procedure with my supervision of the RN administering medicines and monitoring the patients vital signs and mental status throughout from the start of the procedure until the patient was taken to the recovery room.    After obtaining adequate sedation, the patient was prepped and draped  in the standard fashion.    A first assistant is required in order to allow for a safe and more efficient operation.  Duties include wire manipulations as well as assistance with pinning the sheath and positioning the detector for proper angle, assistance and deploying the stent in the proper position and appropriate images.  Further duties include assisting with patient positioning during the procedure.  I believe that this procedure requires a first assistant in order for it to be performed at a level in keeping with the high standards of this institution.  The right common femoral artery was visualized with ultrasound and found to be widely patent. It was then accessed under direct ultrasound guidance without difficulty with a micropuncture needle. A permanent image was recorded.  A microwire was then advanced without difficulty under fluoroscopic guidance followed by a micro-sheath.  A J-wire was placed and we then placed a 6 French sheath. The patient was then heparinized and a total of 9000 units of intravenous heparin were given and an ACT was checked to confirm successful anticoagulation.   A pigtail catheter was then placed into the ascending aorta. This showed type II bovine arch anatomy. The left common carotid artery was then selectively cannulated without difficulty with a Simmons 1 catheter and the catheter advanced into the mid left common carotid artery.  Cervical and cerebral carotid angiography was then performed. There were no obvious intracranial filling defects. The carotid bifurcation demonstrated greater than 80% internal carotid artery stenosis in association with a large ulcerated area.  I then advanced into the external carotid artery with a Glidewire and the vertebral catheter and then exchanged for the Amplatz Super Stiff wire. Over the  Amplatz Super Stiff wire, a 6 [place] shuttle sheath was placed into the mid common carotid artery. I then used the NAV-6  Embolic protection device  and crossed the lesion and parked this in the distal internal carotid artery at the base of the skull.  I then selected a 9 mm x 7 mm x 40 mm exact stent. This was deployed across the lesion encompassing it in its entirety. A 5 mm x 30 mm length balloon was used to post dilate the stent. Only about a 10% residual stenosis was present after angioplasty. Completion angiogram showed normal intracranial filling without new defects. At this point I elected to terminate the procedure. The sheath was removed and StarClose closure device was deployed in the right femoral artery with excellent hemostatic result. The patient was taken to the recovery room in stable condition having tolerated the procedure well.  COMPLICATIONS: none  CONDITION: stable  Cordella Shawl 03/31/2024 9:41 AM   This note was created with Dragon Medical transcription system. Any errors in dictation are purely unintentional.

## 2024-03-31 NOTE — H&P (View-Only) (Signed)
 MRN : [id number]  Jose Cuevas is a 63 y.o. (1960-11-02) male who presents with chief complaint of check carotid arteries.  History of Present Illness:   Patient presents to [facility] for treatment of his critical carotid stenosis in association with a stroke.   The carotid stenosis was identified after CT scan diagnosed carotid stenosis following a left MCA stroke on 11/26/2023.   The patient denies amaurosis fugax.  The patient is recovering well post stroke thus far.   There is no history of migraine headaches. There is a history of seizures and he is currently on Keppra .   The patient is taking enteric-coated aspirin  81 mg daily.   No recent shortening of the patient's walking distance or new symptoms consistent with claudication.  No history of rest pain symptoms. No new ulcers or wounds of the lower extremities have occurred.   There is no history of DVT, PE or superficial thrombophlebitis. No recent episodes of angina or shortness of breath documented.    His CT scan indicated approximately greater than 60% stenosis.  Individual evaluation by myself and Dr. Jama noted its likely about 75%.  This is consistent with the carotid duplex the patient had done today.  His velocities indicate a 60 to 79% stenosis in the left ICA.  He also has hemodynamically significant plaque of greater than 50% noted in the common carotid artery.  There is also significant amount of plaque when visualized and notes about a 75% stenosis.   The CT scan also noted significant stenosis of the bilateral vertebral arteries as well as a significant right subclavian stenosis however ultrasound notes that the left subclavian is stenotic versus the right.  Both of the vertebral arteries have antegrade flow today.  Current Meds  Medication Sig   clopidogrel  (PLAVIX ) 75 MG tablet Take 1 tablet (75 mg total) by mouth daily.   DULoxetine  (CYMBALTA ) 30 MG capsule Take  1 capsule (30 mg total) by mouth at bedtime.   folic acid  (FOLVITE ) 1 MG tablet Take 1 tablet (1 mg total) by mouth daily.   furosemide  (LASIX ) 20 MG tablet Take one tab po M/W/F   hydroxychloroquine  (PLAQUENIL ) 200 MG tablet Take one tablet TWICE daily weekdays. Take one tablet ONCE daily weekends. Take with food.   leflunomide  (ARAVA ) 20 MG tablet Take 1 tablet (20 mg total) by mouth daily.   losartan  (COZAAR ) 25 MG tablet Take 1 tablet (25 mg total) by mouth daily. Hold if SBP <120   lubiprostone  (AMITIZA ) 24 MCG capsule Take 24 mcg by mouth daily.   meclizine  (ANTIVERT ) 12.5 MG tablet Take 1 tablet (12.5 mg total) by mouth 3 (three) times daily as needed for dizziness.   metoCLOPramide  (REGLAN ) 10 MG tablet Take 1 tablet (10 mg total) by mouth 2 (two) times daily.   mirtazapine  (REMERON ) 15 MG tablet Take 1 tablet (15 mg total) by mouth at bedtime.   Nutritional Supplements (ENSURE ACTIVE HEART HEALTH) LIQD Take 1 each by mouth 3 (three) times daily as needed.   oxyCODONE  (OXY IR/ROXICODONE ) 5 MG immediate release tablet Take 1-2 tablets (5-10 mg total) by mouth every 4 (four) hours as needed for moderate pain (pain score 4-6) (pain score 4-6). (Patient taking differently: Take 530 mg by mouth every 4 (four) hours as needed for moderate pain (pain score 4-6) (pain score 4-6).)  pantoprazole  (PROTONIX ) 40 MG tablet Take 1 tablet (40 mg total) by mouth 2 (two) times daily.   Potassium Chloride  CR (MICRO-K ) 8 MEQ CPCR capsule CR TAKE ONE TAB PO M/W/F WITH LASIX    rosuvastatin  (CRESTOR ) 5 MG tablet TAKE 1 TABLET (5 MG TOTAL) BY MOUTH DAILY.   tamsulosin  (FLOMAX ) 0.4 MG CAPS capsule Take 1 capsule (0.4 mg total) by mouth daily.   traZODone  (DESYREL ) 50 MG tablet Take 1 tablet (50 mg total) by mouth at bedtime as needed for sleep.   vitamin B-12 (CYANOCOBALAMIN ) 1000 MCG tablet Take 1,000 mcg by mouth daily.   Vitamin D , Ergocalciferol , (DRISDOL ) 1.25 MG (50000 UNIT) CAPS capsule Take 1 capsule  (50,000 Units total) by mouth every 7 (seven) days.    Past Medical History:  Diagnosis Date   Back pain    Collagen vascular disease (HCC)    Rhematoid Arthritis hx.   Constipation, chronic    DDD (degenerative disc disease), cervical    History of duodenal ulcer    History of kidney stones    Hyperlipidemia    Hypertension    Low kidney function    LT Kidney non-functioning   Nondiabetic gastroparesis    Peripheral blood vessel disorder (HCC)    Rheumatoid arthritis with rheumatoid factor (HCC)    Sleep apnea    Weight loss     Past Surgical History:  Procedure Laterality Date   BACK SURGERY     COLONOSCOPY WITH PROPOFOL  N/A 11/11/2015   Procedure: COLONOSCOPY WITH PROPOFOL ;  Surgeon: Donnice Vaughn Manes, MD;  Location: [facility] ENDOSCOPY;  Service: Endoscopy;  Laterality: N/A;   COLONOSCOPY WITH PROPOFOL  N/A 12/02/2015   Procedure: COLONOSCOPY WITH PROPOFOL ;  Surgeon: Donnice Vaughn Manes, MD;  Location: [facility] ENDOSCOPY;  Service: Endoscopy;  Laterality: N/A;   COLONOSCOPY WITH PROPOFOL  N/A 09/16/2019   Procedure: COLONOSCOPY WITH PROPOFOL ;  Surgeon: Toledo, Ladell POUR, MD;  Location: [facility] ENDOSCOPY;  Service: Gastroenterology;  Laterality: N/A;   ESOPHAGOGASTRODUODENOSCOPY (EGD) WITH PROPOFOL  N/A 11/11/2015   Procedure: ESOPHAGOGASTRODUODENOSCOPY (EGD) WITH PROPOFOL ;  Surgeon: Donnice Vaughn Manes, MD;  Location: [facility] ENDOSCOPY;  Service: Endoscopy;  Laterality: N/A;   ESOPHAGOGASTRODUODENOSCOPY (EGD) WITH PROPOFOL  N/A 12/02/2015   Procedure: ESOPHAGOGASTRODUODENOSCOPY (EGD) WITH PROPOFOL ;  Surgeon: Donnice Vaughn Manes, MD;  Location: [facility] ENDOSCOPY;  Service: Endoscopy;  Laterality: N/A;   ESOPHAGOGASTRODUODENOSCOPY (EGD) WITH PROPOFOL  N/A 01/20/2016   Procedure: ESOPHAGOGASTRODUODENOSCOPY (EGD) WITH PROPOFOL ;  Surgeon: Donnice Vaughn Manes, MD;  Location: [facility] ENDOSCOPY;  Service: Endoscopy;  Laterality: N/A;   ESOPHAGOGASTRODUODENOSCOPY (EGD) WITH PROPOFOL  N/A 09/16/2019   Procedure:  ESOPHAGOGASTRODUODENOSCOPY (EGD) WITH PROPOFOL ;  Surgeon: Toledo, Ladell POUR, MD;  Location: [facility] ENDOSCOPY;  Service: Gastroenterology;  Laterality: N/A;   KNEE RECONSTRUCTION Left    REVERSE SHOULDER ARTHROPLASTY Right 12/03/2023   Procedure: REVERSE SHOULDER ARTHROPLASTY;  Surgeon: Tobie Priest, MD;  Location: [facility] ORS;  Service: Orthopedics;  Laterality: Right;    Social History Social History   Tobacco Use   Smoking status: Former    Types: Cigars   Smokeless tobacco: Never   Tobacco comments:    Occasional  3 a day. Stopped since January   Vaping Use   Vaping status: Never Used  Substance Use Topics   Alcohol  use: No   Drug use: No    Family History Family History  Problem Relation Age of Onset   Diabetes Mother    Diabetes Father    Heart attack Maternal Grandmother    COPD Brother  08/2020   Tourette syndrome Son        in prison 2019   Learning disabilities Son     No Known Allergies   REVIEW OF SYSTEMS (Negative unless checked)  Constitutional: [] Weight loss  [] Fever  [] Chills Cardiac: [] Chest pain   [] Chest pressure   [] Palpitations   [] Shortness of breath when laying flat   [] Shortness of breath with exertion. Vascular:  [x] Pain in legs with walking   [] Pain in legs at rest  [] History of DVT   [] Phlebitis   [] Swelling in legs   [] Varicose veins   [] Non-healing ulcers Pulmonary:   [] Uses home oxygen   [] Productive cough   [] Hemoptysis   [] Wheeze  [] COPD   [] Asthma Neurologic:  [] Dizziness   [] Seizures   [] History of stroke   [] History of TIA  [] Aphasia   [] Vissual changes   [] Weakness or numbness in arm   [] Weakness or numbness in leg Musculoskeletal:   [] Joint swelling   [] Joint pain   [] Low back pain Hematologic:  [] Easy bruising  [] Easy bleeding   [] Hypercoagulable state   [] Anemic Gastrointestinal:  [] Diarrhea   [] Vomiting  [] Gastroesophageal reflux/heartburn   [] Difficulty swallowing. Genitourinary:  [] Chronic kidney disease   [] Difficult urination   [] Frequent urination   [] Blood in urine Skin:  [] Rashes   [] Ulcers  Psychological:  [] History of anxiety   []  History of major depression.  Physical Examination  Vitals:   03/31/24 0723  BP: 121/71  Pulse: 80  Resp: 20  Temp: (!) 97.5 F (36.4 C)  TempSrc: Oral  Weight: 61.8 kg  Height: 5' 7 (1.702 m)   Body mass index is 21.35 kg/m. Gen: WD/WN, NAD Head: [place]/AT, No temporalis wasting.  Ear/Nose/Throat: Hearing grossly intact, nares w/o erythema or drainage Eyes: PER, EOMI, sclera nonicteric.  Neck: Supple, no masses.  No bruit or JVD.  Pulmonary:  Good air movement, no audible wheezing, no use of accessory muscles.  Cardiac: RRR, normal S1, S2, no Murmurs. Vascular:  carotid bruit noted Vessel Right Left  Radial Palpable Palpable  Carotid  Palpable  Palpable  Gastrointestinal: soft, non-distended. No guarding/no peritoneal signs.  Musculoskeletal: M/S 5/5 throughout.  No visible deformity.  Neurologic: CN 2-12 intact. Pain and light touch intact in extremities.  Symmetrical.  Speech is fluent. Motor exam as listed above. Psychiatric: Judgment intact, Mood & affect appropriate for pt's clinical situation. Dermatologic: No rashes or ulcers noted.  No changes consistent with cellulitis.   CBC Lab Results  Component Value Date   WBC 10.5 12/05/2023   HGB 10.6 (L) 12/05/2023   HCT 31.6 (L) 12/05/2023   MCV 85.9 12/05/2023   PLT 308 12/05/2023    BMET    Component Value Date/Time   NA 140 12/05/2023 0451   NA 143 07/12/2023 1152   NA 138 01/08/2014 1158   K 3.5 12/05/2023 0451   K 3.5 01/08/2014 1158   CL 109 12/05/2023 0451   CL 110 (H) 01/08/2014 1158   CO2 22 12/05/2023 0451   CO2 23 01/08/2014 1158   GLUCOSE 103 (H) 12/05/2023 0451   GLUCOSE 173 (H) 01/08/2014 1158   BUN 12 03/31/2024 0726   BUN 14 07/12/2023 1152   BUN 20 (H) 01/08/2014 1158   CREATININE 1.10 03/31/2024 0726   CREATININE 1.23 01/08/2014 1158   CALCIUM  8.9 12/05/2023 0451   CALCIUM   8.5 01/08/2014 1158   GFRNONAA >60 03/31/2024 0726   GFRNONAA >60 01/08/2014 1158   GFRAA >60 04/26/2020 0950   GFRAA >  60 01/08/2014 1158   Estimated Creatinine Clearance: 59.3 mL/min (by C-G formula based on SCr of 1.1 mg/dL).  COAG Lab Results  Component Value Date   INR 1.2 11/27/2023    Radiology No results found.   Assessment/Plan 1. Left carotid artery stenosis (Primary) Recommend:   The patient is symptomatic with respect to the carotid stenosis.  The patient now has progressed and has a lesion the is >70%.   I have completed Shared Decision-Making with Murlin Schrieber Payneprior to carotid surgery/intervention. The conversation included: -Discussion of all treatment options including carotid endarterectomy (CEA), carotid artery stenting (which includes transcarotid artery revascularization (TCAR)), and optimal medical therapy (OMT). -Explanation of risks and benefits for each option specific to E[place] Shifflett [place]'s clinical situation. -Integration of clinical guidelines as it relates to the patient's history and comorbidities. -Discussion and incorporation of HOA DERISO and their personal preferences and priorities in choosing a treatment plan plan     Patient's CT angiography of the carotid arteries confirms >70% left ICA stenosis.  The anatomical considerations support stenting over surgery.  This was discussed in detail with the patient.   The risks, benefits and alternative therapies were reviewed in detail with the patient.  All questions were answered.  The patient agrees to proceed with stenting of the left carotid artery.   The patient's NIHSS score is as follows: 1 Mild: 1 - 5 Mild to Moderately Severe: 5 - 14 Severe: 15 - 24 Very Severe: >25   Continue antiplatelet therapy as prescribed. Continue management of CAD, HTN and Hyperlipidemia. Healthy heart diet, encouraged exercise at least 4 times per week.    2. Essential hypertension Continue antihypertensive  medications as already ordered, these medications have been reviewed and there are no changes at this time.   3. Dyslipidemia Continue statin as ordered and reviewed, no changes at this time    Cordella Shawl, MD  03/31/2024 8:11 AM

## 2024-03-31 NOTE — Progress Notes (Signed)
 Pt. With generalized weakness UE & LE: states he had a stroke 11/26/2023 and hit his right shoulder against a Wall. Pt. Had A right shoulder replacement 11/2023 so he has generalized weakness and limited ROM to right side. States he is still getting Physical therapy: last session was last Friday. Wife reviewed meds. With RN.  Speech clear.

## 2024-03-31 NOTE — Progress Notes (Signed)
 MRN : [id number]  Jose Cuevas is a 63 y.o. (1960-11-02) male who presents with chief complaint of check carotid arteries.  History of Present Illness:   Patient presents to [facility] for treatment of his critical carotid stenosis in association with a stroke.   The carotid stenosis was identified after CT scan diagnosed carotid stenosis following a left MCA stroke on 11/26/2023.   The patient denies amaurosis fugax.  The patient is recovering well post stroke thus far.   There is no history of migraine headaches. There is a history of seizures and he is currently on Keppra .   The patient is taking enteric-coated aspirin  81 mg daily.   No recent shortening of the patient's walking distance or new symptoms consistent with claudication.  No history of rest pain symptoms. No new ulcers or wounds of the lower extremities have occurred.   There is no history of DVT, PE or superficial thrombophlebitis. No recent episodes of angina or shortness of breath documented.    His CT scan indicated approximately greater than 60% stenosis.  Individual evaluation by myself and Dr. Jama noted its likely about 75%.  This is consistent with the carotid duplex the patient had done today.  His velocities indicate a 60 to 79% stenosis in the left ICA.  He also has hemodynamically significant plaque of greater than 50% noted in the common carotid artery.  There is also significant amount of plaque when visualized and notes about a 75% stenosis.   The CT scan also noted significant stenosis of the bilateral vertebral arteries as well as a significant right subclavian stenosis however ultrasound notes that the left subclavian is stenotic versus the right.  Both of the vertebral arteries have antegrade flow today.  Current Meds  Medication Sig   clopidogrel  (PLAVIX ) 75 MG tablet Take 1 tablet (75 mg total) by mouth daily.   DULoxetine  (CYMBALTA ) 30 MG capsule Take  1 capsule (30 mg total) by mouth at bedtime.   folic acid  (FOLVITE ) 1 MG tablet Take 1 tablet (1 mg total) by mouth daily.   furosemide  (LASIX ) 20 MG tablet Take one tab po M/W/F   hydroxychloroquine  (PLAQUENIL ) 200 MG tablet Take one tablet TWICE daily weekdays. Take one tablet ONCE daily weekends. Take with food.   leflunomide  (ARAVA ) 20 MG tablet Take 1 tablet (20 mg total) by mouth daily.   losartan  (COZAAR ) 25 MG tablet Take 1 tablet (25 mg total) by mouth daily. Hold if SBP <120   lubiprostone  (AMITIZA ) 24 MCG capsule Take 24 mcg by mouth daily.   meclizine  (ANTIVERT ) 12.5 MG tablet Take 1 tablet (12.5 mg total) by mouth 3 (three) times daily as needed for dizziness.   metoCLOPramide  (REGLAN ) 10 MG tablet Take 1 tablet (10 mg total) by mouth 2 (two) times daily.   mirtazapine  (REMERON ) 15 MG tablet Take 1 tablet (15 mg total) by mouth at bedtime.   Nutritional Supplements (ENSURE ACTIVE HEART HEALTH) LIQD Take 1 each by mouth 3 (three) times daily as needed.   oxyCODONE  (OXY IR/ROXICODONE ) 5 MG immediate release tablet Take 1-2 tablets (5-10 mg total) by mouth every 4 (four) hours as needed for moderate pain (pain score 4-6) (pain score 4-6). (Patient taking differently: Take 530 mg by mouth every 4 (four) hours as needed for moderate pain (pain score 4-6) (pain score 4-6).)  pantoprazole  (PROTONIX ) 40 MG tablet Take 1 tablet (40 mg total) by mouth 2 (two) times daily.   Potassium Chloride  CR (MICRO-K ) 8 MEQ CPCR capsule CR TAKE ONE TAB PO M/W/F WITH LASIX    rosuvastatin  (CRESTOR ) 5 MG tablet TAKE 1 TABLET (5 MG TOTAL) BY MOUTH DAILY.   tamsulosin  (FLOMAX ) 0.4 MG CAPS capsule Take 1 capsule (0.4 mg total) by mouth daily.   traZODone  (DESYREL ) 50 MG tablet Take 1 tablet (50 mg total) by mouth at bedtime as needed for sleep.   vitamin B-12 (CYANOCOBALAMIN ) 1000 MCG tablet Take 1,000 mcg by mouth daily.   Vitamin D , Ergocalciferol , (DRISDOL ) 1.25 MG (50000 UNIT) CAPS capsule Take 1 capsule  (50,000 Units total) by mouth every 7 (seven) days.    Past Medical History:  Diagnosis Date   Back pain    Collagen vascular disease (HCC)    Rhematoid Arthritis hx.   Constipation, chronic    DDD (degenerative disc disease), cervical    History of duodenal ulcer    History of kidney stones    Hyperlipidemia    Hypertension    Low kidney function    LT Kidney non-functioning   Nondiabetic gastroparesis    Peripheral blood vessel disorder (HCC)    Rheumatoid arthritis with rheumatoid factor (HCC)    Sleep apnea    Weight loss     Past Surgical History:  Procedure Laterality Date   BACK SURGERY     COLONOSCOPY WITH PROPOFOL  N/A 11/11/2015   Procedure: COLONOSCOPY WITH PROPOFOL ;  Surgeon: Donnice Vaughn Manes, MD;  Location: [facility] ENDOSCOPY;  Service: Endoscopy;  Laterality: N/A;   COLONOSCOPY WITH PROPOFOL  N/A 12/02/2015   Procedure: COLONOSCOPY WITH PROPOFOL ;  Surgeon: Donnice Vaughn Manes, MD;  Location: [facility] ENDOSCOPY;  Service: Endoscopy;  Laterality: N/A;   COLONOSCOPY WITH PROPOFOL  N/A 09/16/2019   Procedure: COLONOSCOPY WITH PROPOFOL ;  Surgeon: Toledo, Ladell POUR, MD;  Location: [facility] ENDOSCOPY;  Service: Gastroenterology;  Laterality: N/A;   ESOPHAGOGASTRODUODENOSCOPY (EGD) WITH PROPOFOL  N/A 11/11/2015   Procedure: ESOPHAGOGASTRODUODENOSCOPY (EGD) WITH PROPOFOL ;  Surgeon: Donnice Vaughn Manes, MD;  Location: [facility] ENDOSCOPY;  Service: Endoscopy;  Laterality: N/A;   ESOPHAGOGASTRODUODENOSCOPY (EGD) WITH PROPOFOL  N/A 12/02/2015   Procedure: ESOPHAGOGASTRODUODENOSCOPY (EGD) WITH PROPOFOL ;  Surgeon: Donnice Vaughn Manes, MD;  Location: [facility] ENDOSCOPY;  Service: Endoscopy;  Laterality: N/A;   ESOPHAGOGASTRODUODENOSCOPY (EGD) WITH PROPOFOL  N/A 01/20/2016   Procedure: ESOPHAGOGASTRODUODENOSCOPY (EGD) WITH PROPOFOL ;  Surgeon: Donnice Vaughn Manes, MD;  Location: [facility] ENDOSCOPY;  Service: Endoscopy;  Laterality: N/A;   ESOPHAGOGASTRODUODENOSCOPY (EGD) WITH PROPOFOL  N/A 09/16/2019   Procedure:  ESOPHAGOGASTRODUODENOSCOPY (EGD) WITH PROPOFOL ;  Surgeon: Toledo, Ladell POUR, MD;  Location: [facility] ENDOSCOPY;  Service: Gastroenterology;  Laterality: N/A;   KNEE RECONSTRUCTION Left    REVERSE SHOULDER ARTHROPLASTY Right 12/03/2023   Procedure: REVERSE SHOULDER ARTHROPLASTY;  Surgeon: Tobie Priest, MD;  Location: [facility] ORS;  Service: Orthopedics;  Laterality: Right;    Social History Social History   Tobacco Use   Smoking status: Former    Types: Cigars   Smokeless tobacco: Never   Tobacco comments:    Occasional  3 a day. Stopped since January   Vaping Use   Vaping status: Never Used  Substance Use Topics   Alcohol  use: No   Drug use: No    Family History Family History  Problem Relation Age of Onset   Diabetes Mother    Diabetes Father    Heart attack Maternal Grandmother    COPD Brother  08/2020   Tourette syndrome Son        in prison 2019   Learning disabilities Son     No Known Allergies   REVIEW OF SYSTEMS (Negative unless checked)  Constitutional: [] Weight loss  [] Fever  [] Chills Cardiac: [] Chest pain   [] Chest pressure   [] Palpitations   [] Shortness of breath when laying flat   [] Shortness of breath with exertion. Vascular:  [x] Pain in legs with walking   [] Pain in legs at rest  [] History of DVT   [] Phlebitis   [] Swelling in legs   [] Varicose veins   [] Non-healing ulcers Pulmonary:   [] Uses home oxygen   [] Productive cough   [] Hemoptysis   [] Wheeze  [] COPD   [] Asthma Neurologic:  [] Dizziness   [] Seizures   [] History of stroke   [] History of TIA  [] Aphasia   [] Vissual changes   [] Weakness or numbness in arm   [] Weakness or numbness in leg Musculoskeletal:   [] Joint swelling   [] Joint pain   [] Low back pain Hematologic:  [] Easy bruising  [] Easy bleeding   [] Hypercoagulable state   [] Anemic Gastrointestinal:  [] Diarrhea   [] Vomiting  [] Gastroesophageal reflux/heartburn   [] Difficulty swallowing. Genitourinary:  [] Chronic kidney disease   [] Difficult urination   [] Frequent urination   [] Blood in urine Skin:  [] Rashes   [] Ulcers  Psychological:  [] History of anxiety   []  History of major depression.  Physical Examination  Vitals:   03/31/24 0723  BP: 121/71  Pulse: 80  Resp: 20  Temp: (!) 97.5 F (36.4 C)  TempSrc: Oral  Weight: 61.8 kg  Height: 5' 7 (1.702 m)   Body mass index is 21.35 kg/m. Gen: WD/WN, NAD Head: [place]/AT, No temporalis wasting.  Ear/Nose/Throat: Hearing grossly intact, nares w/o erythema or drainage Eyes: PER, EOMI, sclera nonicteric.  Neck: Supple, no masses.  No bruit or JVD.  Pulmonary:  Good air movement, no audible wheezing, no use of accessory muscles.  Cardiac: RRR, normal S1, S2, no Murmurs. Vascular:  carotid bruit noted Vessel Right Left  Radial Palpable Palpable  Carotid  Palpable  Palpable  Gastrointestinal: soft, non-distended. No guarding/no peritoneal signs.  Musculoskeletal: M/S 5/5 throughout.  No visible deformity.  Neurologic: CN 2-12 intact. Pain and light touch intact in extremities.  Symmetrical.  Speech is fluent. Motor exam as listed above. Psychiatric: Judgment intact, Mood & affect appropriate for pt's clinical situation. Dermatologic: No rashes or ulcers noted.  No changes consistent with cellulitis.   CBC Lab Results  Component Value Date   WBC 10.5 12/05/2023   HGB 10.6 (L) 12/05/2023   HCT 31.6 (L) 12/05/2023   MCV 85.9 12/05/2023   PLT 308 12/05/2023    BMET    Component Value Date/Time   NA 140 12/05/2023 0451   NA 143 07/12/2023 1152   NA 138 01/08/2014 1158   K 3.5 12/05/2023 0451   K 3.5 01/08/2014 1158   CL 109 12/05/2023 0451   CL 110 (H) 01/08/2014 1158   CO2 22 12/05/2023 0451   CO2 23 01/08/2014 1158   GLUCOSE 103 (H) 12/05/2023 0451   GLUCOSE 173 (H) 01/08/2014 1158   BUN 12 03/31/2024 0726   BUN 14 07/12/2023 1152   BUN 20 (H) 01/08/2014 1158   CREATININE 1.10 03/31/2024 0726   CREATININE 1.23 01/08/2014 1158   CALCIUM  8.9 12/05/2023 0451   CALCIUM   8.5 01/08/2014 1158   GFRNONAA >60 03/31/2024 0726   GFRNONAA >60 01/08/2014 1158   GFRAA >60 04/26/2020 0950   GFRAA >  60 01/08/2014 1158   Estimated Creatinine Clearance: 59.3 mL/min (by C-G formula based on SCr of 1.1 mg/dL).  COAG Lab Results  Component Value Date   INR 1.2 11/27/2023    Radiology No results found.   Assessment/Plan 1. Left carotid artery stenosis (Primary) Recommend:   The patient is symptomatic with respect to the carotid stenosis.  The patient now has progressed and has a lesion the is >70%.   I have completed Shared Decision-Making with Jose Shells Payneprior to carotid surgery/intervention. The conversation included: -Discussion of all treatment options including carotid endarterectomy (CEA), carotid artery stenting (which includes transcarotid artery revascularization (TCAR)), and optimal medical therapy (OMT). -Explanation of risks and benefits for each option specific to Jose Cuevas's clinical situation. -Integration of clinical guidelines as it relates to the patient's history and comorbidities. -Discussion and incorporation of Jose Cuevas and their personal preferences and priorities in choosing a treatment plan plan     Patient's CT angiography of the carotid arteries confirms >70% left ICA stenosis.  The anatomical considerations support stenting over surgery.  This was discussed in detail with the patient.   The risks, benefits and alternative therapies were reviewed in detail with the patient.  All questions were answered.  The patient agrees to proceed with stenting of the left carotid artery.   The patient's NIHSS score is as follows: 1 Mild: 1 - 5 Mild to Moderately Severe: 5 - 14 Severe: 15 - 24 Very Severe: >25   Continue antiplatelet therapy as prescribed. Continue management of CAD, HTN and Hyperlipidemia. Healthy heart diet, encouraged exercise at least 4 times per week.    2. Essential hypertension Continue antihypertensive  medications as already ordered, these medications have been reviewed and there are no changes at this time.   3. Dyslipidemia Continue statin as ordered and reviewed, no changes at this time    Cordella Shawl, MD  03/31/2024 8:11 AM

## 2024-04-01 LAB — BASIC METABOLIC PANEL WITH GFR
Anion gap: 6 (ref 5–15)
BUN: 13 mg/dL (ref 8–23)
CO2: 21 mmol/L — ABNORMAL LOW (ref 22–32)
Calcium: 8.3 mg/dL — ABNORMAL LOW (ref 8.9–10.3)
Chloride: 113 mmol/L — ABNORMAL HIGH (ref 98–111)
Creatinine, Ser: 1.04 mg/dL (ref 0.61–1.24)
GFR, Estimated: >60 mL/min (ref >60)
Glucose, Bld: 89 mg/dL (ref 70–99)
Potassium: 3.7 mmol/L (ref 3.5–5.1)
Sodium: 140 mmol/L (ref 135–145)

## 2024-04-01 LAB — CBC
HCT: 35.1 % — ABNORMAL LOW (ref 39.0–52.0)
Hemoglobin: 11.5 g/dL — ABNORMAL LOW (ref 13.0–17.0)
MCH: 28 pg (ref 26.0–34.0)
MCHC: 32.8 g/dL (ref 30.0–36.0)
MCV: 85.4 fL (ref 80.0–100.0)
Platelets: 172 10*3/uL (ref 150–400)
RBC: 4.11 MIL/uL — ABNORMAL LOW (ref 4.22–5.81)
RDW: 16.5 % — ABNORMAL HIGH (ref 11.5–15.5)
WBC: 6.2 10*3/uL (ref 4.0–10.5)
nRBC: 0 % (ref 0.0–0.2)

## 2024-04-01 NOTE — Discharge Summary (Signed)
 [facility]    Discharge Summary    Patient ID:  Jose Cuevas MRN: [id number] DOB/AGE: Jan 01, 1961 63 y.o.  Admit date: 03/31/2024 Discharge date: 04/01/2024 Date of Surgery: 03/31/2024 Surgeon: Surgeon(s): Schnier, Cordella MATSU, MD  Admission Diagnosis: Carotid stenosis, left [I65.22] Symptomatic carotid artery stenosis with infarction [facility]) [I63.239]  Discharge Diagnoses:  Carotid stenosis, left [I65.22] Symptomatic carotid artery stenosis with infarction [facility]) [I63.239]  Secondary Diagnoses: Past Medical History:  Diagnosis Date   Back pain    Collagen vascular disease (HCC)    Rhematoid Arthritis hx.   Constipation, chronic    DDD (degenerative disc disease), cervical    History of duodenal ulcer    History of kidney stones    Hyperlipidemia    Hypertension    Low kidney function    LT Kidney non-functioning   Nondiabetic gastroparesis    Peripheral blood vessel disorder (HCC)    Rheumatoid arthritis with rheumatoid factor (HCC)    Sleep apnea    Weight loss     Procedure(s): CAROTID PTA/STENT INTERVENTION  Discharged Condition: good  HPI:  Jose Cuevas is a 63 yo male who presented to vein and vascular clinic for evaluation of carotid stenosis in association with stroke.  The carotid stenosis was identified on the CT scan which diagnosed carotid stenosis of the left MCA stroke on 11/26/2023.  The CT scan results showed a 75% blockage of the left carotid artery.  Patient recovered well after his stroke.  He was placed on aspirin  81 mg daily.  Patient presented as outpatient to [facility] vein and vascular lab for endovascular left carotid stent placement.  Hospital Course:  Jose Cuevas is a 63 y.o. male is S/P Left left carotid endovascular stent placement.  Patient did very well overnight after the procedure.  Blood pressure and heart rate remained stable.  He did not require any oxygen.  This morning he is ambulated the unit well.  He is urinated  and is eating well.  Patient to be discharged later today.  Patient to be discharged on aspirin  81 mg daily, Plavix  75 mg daily and Crestor  5 mg daily.  Patient was instructed not to miss or skip any of these medications as it will interfere with the outcome of his procedure.  Patient verbalizes understanding.  I spent greater than 60 minutes preparing and teaching and discharging this patient today.  Extubated: POD # 0 Physical Exam:  Alert notes x3, no acute distress Face: Symmetrical.  Tongue is midline. Neck: Trachea is midline.  No swelling or bruising. Cardiovascular: Regular rate and rhythm Pulmonary: Clear to auscultation bilaterally Abdomen: Soft, nontender, nondistended Right groin access: Clean dry and intact.  No swelling or drainage noted Left lower extremity: Thigh soft.  Calf soft.  Extremities warm distally toes.  Hard to palpate pedal pulses however the foot is warm is her good capillary refill. Right lower extremity: Thigh soft.  Calf soft.  Extremities warm distally toes.  Hard to palpate pedal pulses however the foot is warm is her good capillary refill. Neurological: No deficits noted   Post-op wounds:  clean, dry, intact or healing well  Pt. Ambulating, voiding and taking PO diet without difficulty. Pt pain controlled with PO pain meds.  Labs:  As below  Complications: none  Consults:    Significant Diagnostic Studies: CBC Lab Results  Component Value Date   WBC 6.2 04/01/2024   HGB 11.5 (L) 04/01/2024   HCT 35.1 (L) 04/01/2024  MCV 85.4 04/01/2024   PLT 172 04/01/2024    BMET    Component Value Date/Time   NA 140 04/01/2024 0306   NA 143 07/12/2023 1152   NA 138 01/08/2014 1158   K 3.7 04/01/2024 0306   K 3.5 01/08/2014 1158   CL 113 (H) 04/01/2024 0306   CL 110 (H) 01/08/2014 1158   CO2 21 (L) 04/01/2024 0306   CO2 23 01/08/2014 1158   GLUCOSE 89 04/01/2024 0306   GLUCOSE 173 (H) 01/08/2014 1158   BUN 13 04/01/2024 0306   BUN 14  07/12/2023 1152   BUN 20 (H) 01/08/2014 1158   CREATININE 1.04 04/01/2024 0306   CREATININE 1.23 01/08/2014 1158   CALCIUM  8.3 (L) 04/01/2024 0306   CALCIUM  8.5 01/08/2014 1158   GFRNONAA >60 04/01/2024 0306   GFRNONAA >60 01/08/2014 1158   GFRAA >60 04/26/2020 0950   GFRAA >60 01/08/2014 1158   COAG Lab Results  Component Value Date   INR 1.2 11/27/2023     Disposition:  Discharge to :Home  Allergies as of 04/01/2024   No Known Allergies      Medication List     TAKE these medications    acetaminophen  325 MG tablet Commonly known as: TYLENOL  Take 1-2 tablets (325-650 mg total) by mouth every 6 (six) hours as needed for mild pain (pain score 1-3) (or temp > 100.5).   albuterol  108 (90 Base) MCG/ACT inhaler Commonly known as: VENTOLIN  HFA Inhale 2 puffs into the lungs every 6 (six) hours as needed for wheezing or shortness of breath.   aspirin  EC 81 MG tablet Take 81 mg by mouth daily. Swallow whole.   bisacodyl  10 MG suppository Commonly known as: DULCOLAX Place 1 suppository (10 mg total) rectally daily as needed for moderate constipation.   clonazePAM  1 MG tablet Commonly known as: KLONOPIN  TAKE 1 TABLET BY MOUTH AT BEDTIME AS NEEDED FOR ANXIETY.   clopidogrel  75 MG tablet Commonly known as: PLAVIX  Take 1 tablet (75 mg total) by mouth daily.   cyanocobalamin  1000 MCG tablet Commonly known as: VITAMIN B12 Take 1,000 mcg by mouth daily.   DULoxetine  30 MG capsule Commonly known as: CYMBALTA  Take 1 capsule (30 mg total) by mouth at bedtime.   Ensure Active Heart Health Liqd Take 1 each by mouth 3 (three) times daily as needed.   folic acid  1 MG tablet Commonly known as: FOLVITE  Take 1 tablet (1 mg total) by mouth daily.   furosemide  20 MG tablet Commonly known as: LASIX  Take one tab po M/W/F   hydroxychloroquine  200 MG tablet Commonly known as: PLAQUENIL  Take one tablet TWICE daily weekdays. Take one tablet ONCE daily weekends. Take with food.    leflunomide  20 MG tablet Commonly known as: ARAVA  Take 1 tablet (20 mg total) by mouth daily.   levETIRAcetam  500 MG tablet Commonly known as: KEPPRA  Take 1 tablet (500 mg total) by mouth 2 (two) times daily.   losartan  25 MG tablet Commonly known as: COZAAR  Take 1 tablet (25 mg total) by mouth daily. Hold if SBP <120   lubiprostone  24 MCG capsule Commonly known as: AMITIZA  Take 24 mcg by mouth daily.   meclizine  12.5 MG tablet Commonly known as: ANTIVERT  Take 1 tablet (12.5 mg total) by mouth 3 (three) times daily as needed for dizziness.   metoCLOPramide  10 MG tablet Commonly known as: REGLAN  Take 1 tablet (10 mg total) by mouth 2 (two) times daily.   metoprolol  tartrate 25 MG tablet Commonly  known as: LOPRESSOR  Take 1 tablet (25 mg total) by mouth daily as needed. Hold if SBP <110 mmHg and or HR <65   mirtazapine  15 MG tablet Commonly known as: REMERON  Take 1 tablet (15 mg total) by mouth at bedtime.   oxyCODONE  5 MG immediate release tablet Commonly known as: Oxy IR/ROXICODONE  Take 1-2 tablets (5-10 mg total) by mouth every 4 (four) hours as needed for moderate pain (pain score 4-6) (pain score 4-6). What changed: how much to take   pantoprazole  40 MG tablet Commonly known as: PROTONIX  Take 1 tablet (40 mg total) by mouth 2 (two) times daily.   Potassium Chloride  CR 8 MEQ Cpcr capsule CR Commonly known as: MICRO-K  TAKE ONE TAB PO M/W/F WITH LASIX    rosuvastatin  5 MG tablet Commonly known as: CRESTOR  TAKE 1 TABLET (5 MG TOTAL) BY MOUTH DAILY.   tamsulosin  0.4 MG Caps capsule Commonly known as: FLOMAX  Take 1 capsule (0.4 mg total) by mouth daily.   traZODone  50 MG tablet Commonly known as: DESYREL  Take 1 tablet (50 mg total) by mouth at bedtime as needed for sleep.   Trelegy Ellipta  100-62.5-25 MCG/ACT Aepb Generic drug: Fluticasone-Umeclidin-Vilant Inhale 1 puff into the lungs daily.   Vitamin D  (Ergocalciferol ) 1.25 MG (50000 UNIT) Caps  capsule Commonly known as: DRISDOL  Take 1 capsule (50,000 Units total) by mouth every 7 (seven) days.       Verbal and written Discharge instructions given to the patient. Wound care per Discharge AVS  Follow-up Information     Schnier, Cordella MATSU, MD Follow up in 3 week(s).   Specialties: Vascular Surgery, Cardiology, Radiology, Vascular Surgery Why: follow up after procedure with carotid duplex Contact information: [address] [place] [address] [phone]         Schnier, Cordella MATSU, MD Follow up in 4 week(s).   Specialties: Vascular Surgery, Cardiology, Radiology, Vascular Surgery Why: Bilateral Carotid Ultrasounds Contact information: [address] [place] [address] [phone]                 Signed: Gwendlyn JONELLE Shank, NP  04/01/2024, 10:36 AM

## 2024-04-01 NOTE — Discharge Instructions (Signed)
 Vascular Surgery Discharge Instructions:  Do not lift anything heavy for the next 2 weeks.  Do not lift anything more than a gallon of milk.  Do not drive for the next week.  You may shower tomorrow on 04/02/2024 after you get home today.  Please shower with the old dressing in place.  Immediately after shower remove the old dressing pat completely dry and place a Band-Aid over the incision site.  Repeat this process for the next 3 days.  You are being discharged on aspirin  81 mg daily, Plavix  75 mg daily and Crestor  5 mg daily.  Do not miss or skip taking any of these medications as it will alter the outcome of your procedure.  Follow-up with vein and vascular surgery as scheduled.

## 2024-04-07 ENCOUNTER — Ambulatory Visit (INDEPENDENT_AMBULATORY_CARE_PROVIDER_SITE_OTHER): Admitting: Nurse Practitioner

## 2024-04-07 ENCOUNTER — Encounter: Payer: Self-pay | Admitting: Nurse Practitioner

## 2024-04-07 VITALS — BP 124/70 | HR 67 | Temp 98.3°F | Resp 16 | Ht 67.0 in | Wt 142.2 lb

## 2024-04-07 DIAGNOSIS — G4709 Other insomnia: Secondary | ICD-10-CM | POA: Diagnosis not present

## 2024-04-07 DIAGNOSIS — I700 Atherosclerosis of aorta: Secondary | ICD-10-CM | POA: Diagnosis not present

## 2024-04-07 DIAGNOSIS — I63239 Cerebral infarction due to unspecified occlusion or stenosis of unspecified carotid arteries: Secondary | ICD-10-CM

## 2024-04-07 DIAGNOSIS — I10 Essential (primary) hypertension: Secondary | ICD-10-CM | POA: Diagnosis not present

## 2024-04-07 DIAGNOSIS — Z8673 Personal history of transient ischemic attack (TIA), and cerebral infarction without residual deficits: Secondary | ICD-10-CM

## 2024-04-07 MED ORDER — CLONAZEPAM 1 MG PO TABS: 1.0000 mg | ORAL_TABLET | Freq: Every evening | ORAL | 2 refills | Status: DC | PRN

## 2024-04-07 NOTE — Progress Notes (Signed)
 [facility] [address] [place], [place] [address]  Internal MEDICINE  Office Visit Note  Patient Name: Jose Cuevas  [id number]  [id number]  Date of Service: 04/07/2024  Chief Complaint  Patient presents with   Hyperlipidemia   Hypertension   Follow-up    HPI Savannah presents for a follow-up visit for hypertension, aortic atherosclerosis, and insomnia/anxiety.  Carotid stent placed on 7/1 Shoulder replacemenet surgery right done in march. Still having physical therapy.  Aortic atherosclerosis -- taking rosuvastatin   Hypertension -- controlled with furosemide , metoprolol  and losartan  Taking clonazepam  as needed for insomnia and anxiety.    Current Medication: Outpatient Encounter Medications as of 04/07/2024  Medication Sig Note   acetaminophen  (TYLENOL ) 325 MG tablet Take 1-2 tablets (325-650 mg total) by mouth every 6 (six) hours as needed for mild pain (pain score 1-3) (or temp > 100.5).    albuterol  (VENTOLIN  HFA) 108 (90 Base) MCG/ACT inhaler Inhale 2 puffs into the lungs every 6 (six) hours as needed for wheezing or shortness of breath. (Patient not taking: Reported on 03/31/2024)    aspirin  EC 81 MG tablet Take 81 mg by mouth daily. Swallow whole.    bisacodyl  (DULCOLAX) 10 MG suppository Place 1 suppository (10 mg total) rectally daily as needed for moderate constipation. (Patient not taking: Reported on 03/31/2024)    clonazePAM  (KLONOPIN ) 1 MG tablet Take 1 tablet (1 mg total) by mouth at bedtime as needed for anxiety.    clopidogrel  (PLAVIX ) 75 MG tablet Take 1 tablet (75 mg total) by mouth daily.    DULoxetine  (CYMBALTA ) 30 MG capsule Take 1 capsule (30 mg total) by mouth at bedtime.    Fluticasone-Umeclidin-Vilant (TRELEGY ELLIPTA ) 100-62.5-25 MCG/ACT AEPB Inhale 1 puff into the lungs daily. 03/31/2024: Ran out   folic acid  (FOLVITE ) 1 MG tablet Take 1 tablet (1 mg total) by mouth daily.    furosemide  (LASIX ) 20 MG tablet Take one tab po M/W/F     hydroxychloroquine  (PLAQUENIL ) 200 MG tablet Take one tablet TWICE daily weekdays. Take one tablet ONCE daily weekends. Take with food.    leflunomide  (ARAVA ) 20 MG tablet Take 1 tablet (20 mg total) by mouth daily.    levETIRAcetam  (KEPPRA ) 500 MG tablet Take 1 tablet (500 mg total) by mouth 2 (two) times daily. (Patient not taking: Reported on 03/31/2024)    losartan  (COZAAR ) 25 MG tablet Take 1 tablet (25 mg total) by mouth daily. Hold if SBP <120 (Patient not taking: Reported on 03/31/2024)    lubiprostone  (AMITIZA ) 24 MCG capsule Take 24 mcg by mouth daily.    meclizine  (ANTIVERT ) 12.5 MG tablet Take 1 tablet (12.5 mg total) by mouth 3 (three) times daily as needed for dizziness.    metoCLOPramide  (REGLAN ) 10 MG tablet Take 1 tablet (10 mg total) by mouth 2 (two) times daily.    metoprolol  tartrate (LOPRESSOR ) 25 MG tablet Take 1 tablet (25 mg total) by mouth daily as needed. Hold if SBP <110 mmHg and or HR <65    mirtazapine  (REMERON ) 15 MG tablet Take 1 tablet (15 mg total) by mouth at bedtime.    Nutritional Supplements (ENSURE ACTIVE HEART HEALTH) LIQD Take 1 each by mouth 3 (three) times daily as needed.    oxyCODONE  (OXY IR/ROXICODONE ) 5 MG immediate release tablet Take 1-2 tablets (5-10 mg total) by mouth every 4 (four) hours as needed for moderate pain (pain score 4-6) (pain score 4-6). (Patient taking differently: Take 530 mg by mouth every 4 (four) hours  as needed for moderate pain (pain score 4-6) (pain score 4-6).)    pantoprazole  (PROTONIX ) 40 MG tablet Take 1 tablet (40 mg total) by mouth 2 (two) times daily.    Potassium Chloride  CR (MICRO-K ) 8 MEQ CPCR capsule CR TAKE ONE TAB PO M/W/F WITH LASIX     rosuvastatin  (CRESTOR ) 5 MG tablet TAKE 1 TABLET (5 MG TOTAL) BY MOUTH DAILY.    tamsulosin  (FLOMAX ) 0.4 MG CAPS capsule Take 1 capsule (0.4 mg total) by mouth daily.    traZODone  (DESYREL ) 50 MG tablet Take 1 tablet (50 mg total) by mouth at bedtime as needed for sleep.    vitamin B-12  (CYANOCOBALAMIN ) 1000 MCG tablet Take 1,000 mcg by mouth daily. 03/31/2024: Wife states pt. Taking Multivitamin instead of Vit B12 and Vit D   Vitamin D , Ergocalciferol , (DRISDOL ) 1.25 MG (50000 UNIT) CAPS capsule Take 1 capsule (50,000 Units total) by mouth every 7 (seven) days.    [DISCONTINUED] clonazePAM  (KLONOPIN ) 1 MG tablet TAKE 1 TABLET BY MOUTH AT BEDTIME AS NEEDED FOR ANXIETY. (Patient not taking: Reported on 03/31/2024)    No facility-administered encounter medications on file as of 04/07/2024.    Surgical History: Past Surgical History:  Procedure Laterality Date   BACK SURGERY     CAROTID PTA/STENT INTERVENTION Left 03/31/2024   Procedure: CAROTID PTA/STENT INTERVENTION;  Surgeon: Jama Cordella MATSU, MD;  Location: [facility] INVASIVE CV LAB;  Service: Cardiovascular;  Laterality: Left;   COLONOSCOPY WITH PROPOFOL  N/A 11/11/2015   Procedure: COLONOSCOPY WITH PROPOFOL ;  Surgeon: Donnice Vaughn Manes, MD;  Location[facility] ENDOSCOPY;  Service: Endoscopy;  Laterality: N/A;   COLONOSCOPY WITH PROPOFOL  N/A 12/02/2015   Procedure: COLONOSCOPY WITH PROPOFOL ;  Surgeon: Donnice Vaughn Manes, MD;  Location: [facility] ENDOSCOPY;  Service: Endoscopy;  Laterality: N/A;   COLONOSCOPY WITH PROPOFOL  N/A 09/16/2019   Procedure: COLONOSCOPY WITH PROPOFOL ;  Surgeon: Toledo, Ladell POUR, MD;  Location: [facility] ENDOSCOPY;  Service: Gastroenterology;  Laterality: N/A;   ESOPHAGOGASTRODUODENOSCOPY (EGD) WITH PROPOFOL  N/A 11/11/2015   Procedure: ESOPHAGOGASTRODUODENOSCOPY (EGD) WITH PROPOFOL ;  Surgeon: Donnice Vaughn Manes, MD;  Location: [facility] ENDOSCOPY;  Service: Endoscopy;  Laterality: N/A;   ESOPHAGOGASTRODUODENOSCOPY (EGD) WITH PROPOFOL  N/A 12/02/2015   Procedure: ESOPHAGOGASTRODUODENOSCOPY (EGD) WITH PROPOFOL ;  Surgeon: Donnice Vaughn Manes, MD;  Location: [facility] ENDOSCOPY;  Service: Endoscopy;  Laterality: N/A;   ESOPHAGOGASTRODUODENOSCOPY (EGD) WITH PROPOFOL  N/A 01/20/2016   Procedure: ESOPHAGOGASTRODUODENOSCOPY (EGD) WITH PROPOFOL ;   Surgeon: Donnice Vaughn Manes, MD;  Location: [facility] ENDOSCOPY;  Service: Endoscopy;  Laterality: N/A;   ESOPHAGOGASTRODUODENOSCOPY (EGD) WITH PROPOFOL  N/A 09/16/2019   Procedure: ESOPHAGOGASTRODUODENOSCOPY (EGD) WITH PROPOFOL ;  Surgeon: Toledo, Ladell POUR, MD;  Location: [facility] ENDOSCOPY;  Service: Gastroenterology;  Laterality: N/A;   KNEE RECONSTRUCTION Left    REVERSE SHOULDER ARTHROPLASTY Right 12/03/2023   Procedure: REVERSE SHOULDER ARTHROPLASTY;  Surgeon: Tobie Priest, MD;  Location: [facility] ORS;  Service: Orthopedics;  Laterality: Right;    Medical History: Past Medical History:  Diagnosis Date   Back pain    Collagen vascular disease (HCC)    Rhematoid Arthritis hx.   Constipation, chronic    DDD (degenerative disc disease), cervical    History of duodenal ulcer    History of kidney stones    Hyperlipidemia    Hypertension    Low kidney function    LT Kidney non-functioning   Nondiabetic gastroparesis    Peripheral blood vessel disorder (HCC)    Rheumatoid arthritis with rheumatoid factor (HCC)    Sleep apnea    Weight loss  Family History: Family History  Problem Relation Age of Onset   Diabetes Mother    Diabetes Father    Heart attack Maternal Grandmother    COPD Brother        08/2020   Tourette syndrome Son        in prison 2019   Learning disabilities Son     Social History   Socioeconomic History   Marital status: Married    Spouse name: Sari   Number of children: 4   Years of education: Not on file   Highest education level: Not on file  Occupational History   Not on file  Tobacco Use   Smoking status: Former    Types: Cigars   Smokeless tobacco: Never   Tobacco comments:    Occasional  3 a day. Stopped since January   Vaping Use   Vaping status: Never Used  Substance and Sexual Activity   Alcohol  use: No   Drug use: No   Sexual activity: Yes    Birth control/protection: None  Other Topics Concern   Not on file  Social History Narrative    Lives at home with wife. Kids lives within 2 miles    Social Drivers of Health   Financial Resource Strain: Low Risk  (01/01/2024)   Received from [facility]   Overall Financial Resource Strain (CARDIA)    Difficulty of Paying Living Expenses: Not very hard  Food Insecurity: No Food Insecurity (03/31/2024)   Hunger Vital Sign    Worried About Running Out of Food in the Last Year: Never true    Ran Out of Food in the Last Year: Never true  Transportation Needs: No Transportation Needs (03/31/2024)   PRAPARE - Administrator, Civil Service (Medical): No    Lack of Transportation (Non-Medical): No  Physical Activity: Not on file  Stress: Not on file  Social Connections: Socially Isolated (11/27/2023)   Social Connection and Isolation Panel    Frequency of Communication with Friends and Family: Once a week    Frequency of Social Gatherings with Friends and Family: Once a week    Attends Religious Services: Never    Database administrator or Organizations: No    Attends Banker Meetings: Never    Marital Status: Married  Catering manager Violence: Not At Risk (03/31/2024)   Humiliation, Afraid, Rape, and Kick questionnaire    Fear of Current or Ex-Partner: No    Emotionally Abused: No    Physically Abused: No    Sexually Abused: No      Review of Systems  Constitutional:  Positive for fatigue. Negative for chills and fever.  HENT: Negative.  Negative for congestion, mouth sores and postnasal drip.   Respiratory:  Positive for shortness of breath (intermittent). Negative for cough, chest tightness and wheezing.   Cardiovascular: Negative.  Negative for chest pain and palpitations.  Gastrointestinal: Negative.   Genitourinary:  Negative for flank pain.  Musculoskeletal:  Positive for arthralgias and back pain.  Neurological:  Positive for dizziness and weakness.  Psychiatric/Behavioral:  Positive for behavioral problems and sleep disturbance.  Negative for self-injury and suicidal ideas. The patient is nervous/anxious.     Vital Signs: BP 124/70   Pulse 67   Temp 98.3 F (36.8 C)   Resp 16   Ht 5' 7 (1.702 m)   Wt 142 lb 3.2 oz (64.5 kg)   SpO2 96%   BMI 22.27 kg/m  Physical Exam Vitals reviewed.  Constitutional:      General: He is not in acute distress.    Appearance: Normal appearance. He is normal weight. He is not ill-appearing.  HENT:     Head: Normocephalic and atraumatic.  Eyes:     Pupils: Pupils are equal, round, and reactive to light.  Cardiovascular:     Rate and Rhythm: Normal rate and regular rhythm.     Heart sounds: Normal heart sounds. No murmur heard. Pulmonary:     Effort: Pulmonary effort is normal. No respiratory distress.     Breath sounds: Normal breath sounds. No wheezing.  Skin:    General: Skin is warm and dry.     Capillary Refill: Capillary refill takes less than 2 seconds.  Neurological:     Mental Status: He is alert and oriented to person, place, and time.     Motor: No weakness.  Psychiatric:        Mood and Affect: Mood normal.        Behavior: Behavior normal.        Assessment/Plan: 1. Essential hypertension (Primary) Stable, continue medications as prescribed.   2. Aortic atherosclerosis (HCC) Continue rosuvastatin  as prescribed.   3. Other insomnia Continue clonazepam  as needed as prescribed. Follow up in October for refills  - clonazePAM  (KLONOPIN ) 1 MG tablet; Take 1 tablet (1 mg total) by mouth at bedtime as needed for anxiety.  Dispense: 30 tablet; Refill: 2  4. Symptomatic carotid artery stenosis with infarction [facility]) Recent stent placed in carotid artery   5. History of stroke Noted, on BP medications and statin therapy    General Counseling: Morrison verbalizes understanding of the findings of todays visit and agrees with plan of treatment. I have discussed any further diagnostic evaluation that may be needed or ordered today. We also reviewed his  medications today. he has been encouraged to call the office with any questions or concerns that should arise related to todays visit.    No orders of the defined types were placed in this encounter.   Meds ordered this encounter  Medications   clonazePAM  (KLONOPIN ) 1 MG tablet    Sig: Take 1 tablet (1 mg total) by mouth at bedtime as needed for anxiety.    Dispense:  30 tablet    Refill:  2    Not to exceed 3 additional fills before 04/07/2024    Return in about 3 months (around 07/08/2024) for previously scheduled, AWV, Justo Hengel PCP and medication refills. .   Total time spent:30 Minutes Time spent includes review of chart, medications, test results, and follow up plan with the patient.   [place] Controlled Substance Database was reviewed by me.  This patient was seen by Mardy Maxin, FNP-C in collaboration with Dr. Sigrid Bathe as a part of collaborative care agreement.   Hazelle Woollard R. Maxin, MSN, FNP-C Internal medicine

## 2024-04-23 ENCOUNTER — Other Ambulatory Visit (INDEPENDENT_AMBULATORY_CARE_PROVIDER_SITE_OTHER): Payer: Self-pay | Admitting: Vascular Surgery

## 2024-04-23 DIAGNOSIS — I6523 Occlusion and stenosis of bilateral carotid arteries: Secondary | ICD-10-CM

## 2024-04-26 NOTE — Progress Notes (Signed)
 MRN : [id number]  Jose Cuevas is a 63 y.o. ([birth year]) male who presents with chief complaint of check carotid arteries.  History of Present Illness:   The patient is seen for follow up evaluation of carotid stenosis status post left carotid stent on 03/31/2024.  There were no post operative problems or complications related to the surgery.    Procedure: Placement of a 9 x 7 x 40 exact stent with the use of the NAV-6 embolic protection device in the left internal carotid artery   The patient denies interval amaurosis fugax. There is no recent history of TIA symptoms or focal motor deficits. There is no prior documented CVA.  The patient denies headache.  The patient is taking enteric-coated aspirin  81 mg daily.  No recent shortening of the patient's walking distance or new symptoms consistent with claudication.  No history of rest pain symptoms. No new ulcers or wounds of the lower extremities have occurred.  There is no history of DVT, PE or superficial thrombophlebitis. No recent episodes of angina or shortness of breath documented.   No outpatient medications have been marked as taking for the 04/27/24 encounter (Appointment) with Jama, Cordella MATSU, MD.    Past Medical History:  Diagnosis Date   Back pain    Collagen vascular disease (HCC)    Rhematoid Arthritis hx.   Constipation, chronic    DDD (degenerative disc disease), cervical    History of duodenal ulcer    History of kidney stones    Hyperlipidemia    Hypertension    Low kidney function    LT Kidney non-functioning   Nondiabetic gastroparesis    Peripheral blood vessel disorder (HCC)    Rheumatoid arthritis with rheumatoid factor (HCC)    Sleep apnea    Weight loss     Past Surgical History:  Procedure Laterality Date   BACK SURGERY     CAROTID PTA/STENT INTERVENTION Left 03/31/2024   Procedure: CAROTID PTA/STENT INTERVENTION;  Surgeon: Jama Cordella MATSU, MD;  Location: [facility]  INVASIVE CV LAB;  Service: Cardiovascular;  Laterality: Left;   COLONOSCOPY WITH PROPOFOL  N/A 11/11/2015   Procedure: COLONOSCOPY WITH PROPOFOL ;  Surgeon: Donnice Vaughn Manes, MD;  Location: [facility] ENDOSCOPY;  Service: Endoscopy;  Laterality: N/A;   COLONOSCOPY WITH PROPOFOL  N/A 12/02/2015   Procedure: COLONOSCOPY WITH PROPOFOL ;  Surgeon: Donnice Vaughn Manes, MD;  Location: [facility] ENDOSCOPY;  Service: Endoscopy;  Laterality: N/A;   COLONOSCOPY WITH PROPOFOL  N/A 09/16/2019   Procedure: COLONOSCOPY WITH PROPOFOL ;  Surgeon: Toledo, Ladell POUR, MD;  Location: [facility] ENDOSCOPY;  Service: Gastroenterology;  Laterality: N/A;   ESOPHAGOGASTRODUODENOSCOPY (EGD) WITH PROPOFOL  N/A 11/11/2015   Procedure: ESOPHAGOGASTRODUODENOSCOPY (EGD) WITH PROPOFOL ;  Surgeon: Donnice Vaughn Manes, MD;  Location: [facility] ENDOSCOPY;  Service: Endoscopy;  Laterality: N/A;   ESOPHAGOGASTRODUODENOSCOPY (EGD) WITH PROPOFOL  N/A 12/02/2015   Procedure: ESOPHAGOGASTRODUODENOSCOPY (EGD) WITH PROPOFOL ;  Surgeon: Donnice Vaughn Manes, MD;  Location: [facility] ENDOSCOPY;  Service: Endoscopy;  Laterality: N/A;   ESOPHAGOGASTRODUODENOSCOPY (EGD) WITH PROPOFOL  N/A 01/20/2016   Procedure: ESOPHAGOGASTRODUODENOSCOPY (EGD) WITH PROPOFOL ;  Surgeon: Donnice Vaughn Manes, MD;  Location: [facility] ENDOSCOPY;  Service: Endoscopy;  Laterality: N/A;   ESOPHAGOGASTRODUODENOSCOPY (EGD) WITH PROPOFOL  N/A 09/16/2019   Procedure: ESOPHAGOGASTRODUODENOSCOPY (EGD) WITH PROPOFOL ;  Surgeon: Toledo, Ladell POUR, MD;  Location: [facility] ENDOSCOPY;  Service: Gastroenterology;  Laterality: N/A;   KNEE RECONSTRUCTION Left    REVERSE  SHOULDER ARTHROPLASTY Right 12/03/2023   Procedure: REVERSE SHOULDER ARTHROPLASTY;  Surgeon: Tobie Priest, MD;  Location: [facility] ORS;  Service: Orthopedics;  Laterality: Right;    Social History Social History   Tobacco Use   Smoking status: Former    Types: Cigars   Smokeless tobacco: Never   Tobacco comments:    Occasional  3 a day. Stopped since January    Vaping Use   Vaping status: Never Used  Substance Use Topics   Alcohol  use: No   Drug use: No    Family History Family History  Problem Relation Age of Onset   Diabetes Mother    Diabetes Father    Heart attack Maternal Grandmother    COPD Brother        08/2020   Tourette syndrome Son        in prison 2019   Learning disabilities Son     No Known Allergies   REVIEW OF SYSTEMS (Negative unless checked)  Constitutional: [] Weight loss  [] Fever  [] Chills Cardiac: [] Chest pain   [] Chest pressure   [] Palpitations   [] Shortness of breath when laying flat   [] Shortness of breath with exertion. Vascular:  [x] Pain in legs with walking   [] Pain in legs at rest  [] History of DVT   [] Phlebitis   [] Swelling in legs   [] Varicose veins   [] Non-healing ulcers Pulmonary:   [] Uses home oxygen   [] Productive cough   [] Hemoptysis   [] Wheeze  [] COPD   [] Asthma Neurologic:  [] Dizziness   [] Seizures   [] History of stroke   [] History of TIA  [] Aphasia   [] Vissual changes   [] Weakness or numbness in arm   [] Weakness or numbness in leg Musculoskeletal:   [] Joint swelling   [] Joint pain   [] Low back pain Hematologic:  [] Easy bruising  [] Easy bleeding   [] Hypercoagulable state   [] Anemic Gastrointestinal:  [] Diarrhea   [] Vomiting  [] Gastroesophageal reflux/heartburn   [] Difficulty swallowing. Genitourinary:  [] Chronic kidney disease   [] Difficult urination  [] Frequent urination   [] Blood in urine Skin:  [] Rashes   [] Ulcers  Psychological:  [] History of anxiety   []  History of major depression.  Physical Examination  There were no vitals filed for this visit. There is no height or weight on file to calculate BMI. Gen: WD/WN, NAD Head: /AT, No temporalis wasting.  Ear/Nose/Throat: Hearing grossly intact, nares w/o erythema or drainage Eyes: PER, EOMI, sclera nonicteric.  Neck: Supple, no masses.  No bruit or JVD.  Pulmonary:  Good air movement, no audible wheezing, no use of accessory muscles.   Cardiac: RRR, normal S1, S2, no Murmurs. Vascular:  carotid bruit noted Vessel Right Left  Radial Palpable Palpable  Carotid  Palpable  Palpable  Gastrointestinal: soft, non-distended. No guarding/no peritoneal signs.  Musculoskeletal: M/S 5/5 throughout.  No visible deformity.  Neurologic: CN 2-12 intact. Pain and light touch intact in extremities.  Symmetrical.  Speech is fluent. Motor exam as listed above. Psychiatric: Judgment intact, Mood & affect appropriate for pt's clinical situation. Dermatologic: No rashes or ulcers noted.  No changes consistent with cellulitis.   CBC Lab Results  Component Value Date   WBC 6.2 04/01/2024   HGB 11.5 (L) 04/01/2024   HCT 35.1 (L) 04/01/2024   MCV 85.4 04/01/2024   PLT 172 04/01/2024    BMET    Component Value Date/Time   NA 140 04/01/2024 0306   NA 143 07/12/2023 1152   NA 138 01/08/2014 1158   K 3.7 04/01/2024 0306  K 3.5 01/08/2014 1158   CL 113 (H) 04/01/2024 0306   CL 110 (H) 01/08/2014 1158   CO2 21 (L) 04/01/2024 0306   CO2 23 01/08/2014 1158   GLUCOSE 89 04/01/2024 0306   GLUCOSE 173 (H) 01/08/2014 1158   BUN 13 04/01/2024 0306   BUN 14 07/12/2023 1152   BUN 20 (H) 01/08/2014 1158   CREATININE 1.04 04/01/2024 0306   CREATININE 1.23 01/08/2014 1158   CALCIUM  8.3 (L) 04/01/2024 0306   CALCIUM  8.5 01/08/2014 1158   GFRNONAA >60 04/01/2024 0306   GFRNONAA >60 01/08/2014 1158   GFRAA >60 04/26/2020 0950   GFRAA >60 01/08/2014 1158   CrCl cannot be calculated (Patient's most recent lab result is older than the maximum 21 days allowed.).  COAG Lab Results  Component Value Date   INR 1.2 11/27/2023    Radiology PERIPHERAL VASCULAR CATHETERIZATION Result Date: 03/31/2024 See surgical note for result.    Assessment/Plan 1. Symptomatic carotid artery stenosis with infarction (HCC) (Primary) Recommend:  The patient is s/p successful left carotid stent  Duplex ultrasound  shows 40-59% LICA   stenosis.  Continue dual antiplatelet therapy as prescribed for a minimum of 6 months Continue management of CAD, HTN and Hyperlipidemia Healthy heart diet,  encouraged exercise at least 4 times per week  The patient's NIHSS score is as follows: 0 Mild: 1 - 5 Mild to Moderately Severe: 5 - 14 Severe: 15 - 24 Very Severe: >25  Follow up in 6 months with duplex ultrasound and physical exam based on the patient's carotid surgery and <50% stenosis of the right carotid artery  - VAS US  CAROTID; Future d   Cordella Shawl, MD  04/26/2024 4:38 PM

## 2024-04-27 ENCOUNTER — Encounter (INDEPENDENT_AMBULATORY_CARE_PROVIDER_SITE_OTHER): Payer: Self-pay | Admitting: Vascular Surgery

## 2024-04-27 ENCOUNTER — Ambulatory Visit (INDEPENDENT_AMBULATORY_CARE_PROVIDER_SITE_OTHER)

## 2024-04-27 ENCOUNTER — Ambulatory Visit (INDEPENDENT_AMBULATORY_CARE_PROVIDER_SITE_OTHER): Admitting: Vascular Surgery

## 2024-04-27 VITALS — BP 119/73 | HR 71 | Resp 16 | Ht 64.0 in | Wt 144.0 lb

## 2024-04-27 DIAGNOSIS — I6523 Occlusion and stenosis of bilateral carotid arteries: Secondary | ICD-10-CM | POA: Diagnosis not present

## 2024-04-27 DIAGNOSIS — I63239 Cerebral infarction due to unspecified occlusion or stenosis of unspecified carotid arteries: Secondary | ICD-10-CM

## 2024-05-02 ENCOUNTER — Encounter (INDEPENDENT_AMBULATORY_CARE_PROVIDER_SITE_OTHER): Payer: Self-pay | Admitting: Vascular Surgery

## 2024-07-12 ENCOUNTER — Other Ambulatory Visit: Payer: Self-pay | Admitting: Nurse Practitioner

## 2024-07-12 DIAGNOSIS — Z79899 Other long term (current) drug therapy: Secondary | ICD-10-CM

## 2024-07-14 ENCOUNTER — Telehealth: Payer: Self-pay | Admitting: Nurse Practitioner

## 2024-07-14 NOTE — Telephone Encounter (Signed)
 Lvm to move up appointment & if okay to see dfk on 07/16/24 -Andree

## 2024-07-16 ENCOUNTER — Encounter: Payer: Self-pay | Admitting: Internal Medicine

## 2024-07-16 ENCOUNTER — Ambulatory Visit (INDEPENDENT_AMBULATORY_CARE_PROVIDER_SITE_OTHER): Payer: Medicare Other | Admitting: Internal Medicine

## 2024-07-16 ENCOUNTER — Telehealth: Payer: Self-pay

## 2024-07-16 VITALS — BP 110/72 | HR 78 | Temp 95.9°F | Resp 16 | Ht 67.0 in | Wt 156.0 lb

## 2024-07-16 DIAGNOSIS — M0579 Rheumatoid arthritis with rheumatoid factor of multiple sites without organ or systems involvement: Secondary | ICD-10-CM

## 2024-07-16 DIAGNOSIS — D649 Anemia, unspecified: Secondary | ICD-10-CM

## 2024-07-16 DIAGNOSIS — J432 Centrilobular emphysema: Secondary | ICD-10-CM | POA: Diagnosis not present

## 2024-07-16 DIAGNOSIS — Z0001 Encounter for general adult medical examination with abnormal findings: Secondary | ICD-10-CM | POA: Diagnosis not present

## 2024-07-16 DIAGNOSIS — I700 Atherosclerosis of aorta: Secondary | ICD-10-CM

## 2024-07-16 DIAGNOSIS — Z79899 Other long term (current) drug therapy: Secondary | ICD-10-CM

## 2024-07-16 DIAGNOSIS — Z23 Encounter for immunization: Secondary | ICD-10-CM

## 2024-07-16 DIAGNOSIS — Z125 Encounter for screening for malignant neoplasm of prostate: Secondary | ICD-10-CM

## 2024-07-16 MED ORDER — BUDESONIDE-FORMOTEROL FUMARATE 160-4.5 MCG/ACT IN AERO: 2.0000 | INHALATION_SPRAY | Freq: Two times a day (BID) | RESPIRATORY_TRACT | 3 refills | Status: AC

## 2024-07-16 NOTE — Telephone Encounter (Signed)
 Paperwork ready for pickup for certification os disability for property tax exclusion

## 2024-07-16 NOTE — Progress Notes (Signed)
 [facility] [address] [place], [place] [address]  Internal MEDICINE  Office Visit Note  Patient Name: Jose Cuevas  [id number]  [id number]  Date of Service: 07/16/2024  Chief Complaint  Patient presents with   Hyperlipidemia   Hypertension   Medicare Wellness     HPI Pt is here for routine health maintenance examination Patient had carotid endarterectomy and had a left carotid stent, patient is to continue on dual antiplatelet therapy for minimum of 6 months, patient also has a right carotid atherosclerosis, patient also has history of stroke Patient also has COPD has not been using any inhaler due to cost His other medical problems include rheumatoid arthritis, tremors, depression and generalized anxiety disorder  Current Medication: Outpatient Encounter Medications as of 07/16/2024  Medication Sig Note   budesonide -formoterol  (SYMBICORT ) 160-4.5 MCG/ACT inhaler Inhale 2 puffs into the lungs 2 (two) times daily.    gabapentin (NEURONTIN) 100 MG capsule Take 100 mg twice a day for one week, then increase to 200 mg(2 tablets) twice a day and continue    acetaminophen  (TYLENOL ) 325 MG tablet Take 1-2 tablets (325-650 mg total) by mouth every 6 (six) hours as needed for mild pain (pain score 1-3) (or temp > 100.5).    albuterol  (VENTOLIN  HFA) 108 (90 Base) MCG/ACT inhaler Inhale 2 puffs into the lungs every 6 (six) hours as needed for wheezing or shortness of breath. (Patient not taking: Reported on 07/16/2024)    aspirin  EC 81 MG tablet Take 81 mg by mouth daily. Swallow whole.    bisacodyl  (DULCOLAX) 10 MG suppository Place 1 suppository (10 mg total) rectally daily as needed for moderate constipation.    clonazePAM  (KLONOPIN ) 1 MG tablet Take 1 tablet (1 mg total) by mouth at bedtime as needed for anxiety.    clopidogrel  (PLAVIX ) 75 MG tablet Take 1 tablet (75 mg total) by mouth daily.    DULoxetine  (CYMBALTA ) 30 MG capsule Take 1 capsule (30 mg total) by mouth at  bedtime.    folic acid  (FOLVITE ) 1 MG tablet Take 1 tablet (1 mg total) by mouth daily.    furosemide  (LASIX ) 20 MG tablet Take one tab po M/W/F    hydroxychloroquine  (PLAQUENIL ) 200 MG tablet Take one tablet TWICE daily weekdays. Take one tablet ONCE daily weekends. Take with food.    inFLIXimab-axxq (AVSOLA) 100 MG injection Inject into the vein. Get by [facility]    leflunomide  (ARAVA ) 20 MG tablet Take 1 tablet (20 mg total) by mouth daily.    levETIRAcetam  (KEPPRA ) 500 MG tablet Take 1 tablet (500 mg total) by mouth 2 (two) times daily.    losartan  (COZAAR ) 25 MG tablet Take 1 tablet (25 mg total) by mouth daily. Hold if SBP <120    lubiprostone  (AMITIZA ) 24 MCG capsule Take 24 mcg by mouth daily.    meclizine  (ANTIVERT ) 12.5 MG tablet Take 1 tablet (12.5 mg total) by mouth 3 (three) times daily as needed for dizziness.    metoCLOPramide  (REGLAN ) 10 MG tablet Take 1 tablet (10 mg total) by mouth 2 (two) times daily.    metoprolol  tartrate (LOPRESSOR ) 25 MG tablet Take 1 tablet (25 mg total) by mouth daily as needed. Hold if SBP <110 mmHg and or HR <65    mirtazapine  (REMERON ) 15 MG tablet Take 1 tablet (15 mg total) by mouth at bedtime.    Nutritional Supplements (ENSURE ACTIVE HEART HEALTH) LIQD Take 1 each by mouth 3 (three) times daily as needed.  oxyCODONE  (OXY IR/ROXICODONE ) 5 MG immediate release tablet Take 1-2 tablets (5-10 mg total) by mouth every 4 (four) hours as needed for moderate pain (pain score 4-6) (pain score 4-6). (Patient taking differently: Take 530 mg by mouth every 4 (four) hours as needed for moderate pain (pain score 4-6) (pain score 4-6).)    pantoprazole  (PROTONIX ) 40 MG tablet Take 1 tablet (40 mg total) by mouth 2 (two) times daily.    Potassium Chloride  CR (MICRO-K ) 8 MEQ CPCR capsule CR TAKE ONE TAB PO M/W/F WITH LASIX     rosuvastatin  (CRESTOR ) 5 MG tablet TAKE 1 TABLET (5 MG TOTAL) BY MOUTH DAILY.    tamsulosin  (FLOMAX ) 0.4 MG CAPS capsule Take 1 capsule (0.4 mg  total) by mouth daily.    traZODone  (DESYREL ) 50 MG tablet Take 1 tablet (50 mg total) by mouth at bedtime as needed for sleep.    vitamin B-12 (CYANOCOBALAMIN ) 1000 MCG tablet Take 1,000 mcg by mouth daily. 03/31/2024: Wife states pt. Taking Multivitamin instead of Vit B12 and Vit D   Vitamin D , Ergocalciferol , (DRISDOL ) 1.25 MG (50000 UNIT) CAPS capsule Take 1 capsule (50,000 Units total) by mouth every 7 (seven) days.    [DISCONTINUED] Fluticasone-Umeclidin-Vilant (TRELEGY ELLIPTA ) 100-62.5-25 MCG/ACT AEPB Inhale 1 puff into the lungs daily. 03/31/2024: Ran out   No facility-administered encounter medications on file as of 07/16/2024.    Surgical History: Past Surgical History:  Procedure Laterality Date   BACK SURGERY     CAROTID PTA/STENT INTERVENTION Left 03/31/2024   Procedure: CAROTID PTA/STENT INTERVENTION;  Surgeon: Jama Cordella MATSU, MD;  Location: [facility] INVASIVE CV LAB;  Service: Cardiovascular;  Laterality: Left;   COLONOSCOPY WITH PROPOFOL  N/A 11/11/2015   Procedure: COLONOSCOPY WITH PROPOFOL ;  Surgeon: Donnice Vaughn Manes, MD;  Location: [facility] ENDOSCOPY;  Service: Endoscopy;  Laterality: N/A;   COLONOSCOPY WITH PROPOFOL  N/A 12/02/2015   Procedure: COLONOSCOPY WITH PROPOFOL ;  Surgeon: Donnice Vaughn Manes, MD;  Location: [facility] ENDOSCOPY;  Service: Endoscopy;  Laterality: N/A;   COLONOSCOPY WITH PROPOFOL  N/A 09/16/2019   Procedure: COLONOSCOPY WITH PROPOFOL ;  Surgeon: Toledo, Ladell POUR, MD;  Location: [facility] ENDOSCOPY;  Service: Gastroenterology;  Laterality: N/A;   ESOPHAGOGASTRODUODENOSCOPY (EGD) WITH PROPOFOL  N/A 11/11/2015   Procedure: ESOPHAGOGASTRODUODENOSCOPY (EGD) WITH PROPOFOL ;  Surgeon: Donnice Vaughn Manes, MD;  Location: [facility] ENDOSCOPY;  Service: Endoscopy;  Laterality: N/A;   ESOPHAGOGASTRODUODENOSCOPY (EGD) WITH PROPOFOL  N/A 12/02/2015   Procedure: ESOPHAGOGASTRODUODENOSCOPY (EGD) WITH PROPOFOL ;  Surgeon: Donnice Vaughn Manes, MD;  Location: [facility] ENDOSCOPY;  Service: Endoscopy;   Laterality: N/A;   ESOPHAGOGASTRODUODENOSCOPY (EGD) WITH PROPOFOL  N/A 01/20/2016   Procedure: ESOPHAGOGASTRODUODENOSCOPY (EGD) WITH PROPOFOL ;  Surgeon: Donnice Vaughn Manes, MD;  Location: [facility] ENDOSCOPY;  Service: Endoscopy;  Laterality: N/A;   ESOPHAGOGASTRODUODENOSCOPY (EGD) WITH PROPOFOL  N/A 09/16/2019   Procedure: ESOPHAGOGASTRODUODENOSCOPY (EGD) WITH PROPOFOL ;  Surgeon: Toledo, Ladell POUR, MD;  Location: [facility] ENDOSCOPY;  Service: Gastroenterology;  Laterality: N/A;   KNEE RECONSTRUCTION Left    REVERSE SHOULDER ARTHROPLASTY Right 12/03/2023   Procedure: REVERSE SHOULDER ARTHROPLASTY;  Surgeon: Tobie Priest, MD;  Location: [facility] ORS;  Service: Orthopedics;  Laterality: Right;    Medical History: Past Medical History:  Diagnosis Date   Back pain    Collagen vascular disease    Rhematoid Arthritis hx.   Constipation, chronic    DDD (degenerative disc disease), cervical    History of duodenal ulcer    History of kidney stones    Hyperlipidemia    Hypertension    Low kidney function    LT  Kidney non-functioning   Nondiabetic gastroparesis    Peripheral blood vessel disorder    Rheumatoid arthritis with rheumatoid factor (HCC)    Sleep apnea    Weight loss     Family History: Family History  Problem Relation Age of Onset   Diabetes Mother    Diabetes Father    Heart attack Maternal Grandmother    COPD Brother        08/2020   Tourette syndrome Son        in prison 2019   Learning disabilities Son     Social History: Social History   Socioeconomic History   Marital status: Married    Spouse name: Sari   Number of children: 4   Years of education: Not on file   Highest education level: Not on file  Occupational History   Not on file  Tobacco Use   Smoking status: Former    Types: Cigars   Smokeless tobacco: Never   Tobacco comments:    Occasional  3 a day. Stopped since January   Vaping Use   Vaping status: Never Used  Substance and Sexual Activity   Alcohol   use: No   Drug use: No   Sexual activity: Yes    Birth control/protection: None  Other Topics Concern   Not on file  Social History Narrative   Lives at home with wife. Kids lives within 2 miles    Social Drivers of Health   Financial Resource Strain: Low Risk  (04/15/2024)   Received from [facility]   Overall Financial Resource Strain (CARDIA)    Difficulty of Paying Living Expenses: Not very hard  Food Insecurity: No Food Insecurity (04/15/2024)   Received [facility]   Hunger Vital Sign    Within the past 12 months, you worried that your food would run out before you got the money to buy more.: Never true    Within the past 12 months, the food you bought just didn't last and you didn't have money to get more.: Never true  Transportation Needs: No Transportation Needs (04/15/2024)   Received from [facility] - Transportation    In the past 12 months, has lack of transportation kept you from medical appointments or from getting medications?: No    Lack of Transportation (Non-Medical): No  Physical Activity: Not on file  Stress: Not on file  Social Connections: Socially Isolated (11/27/2023)   Social Connection and Isolation Panel    Frequency of Communication with Friends and Family: Once a week    Frequency of Social Gatherings with Friends and Family: Once a week    Attends Religious Services: Never    Database administrator or Organizations: No    Attends Engineer, structural: Never    Marital Status: Married      Review of Systems  Constitutional:  Negative for chills, fatigue, fever and unexpected weight change.  HENT:  Negative for congestion, mouth sores, rhinorrhea, sneezing and sore throat.   Eyes:  Negative for redness.  Respiratory:  Positive for shortness of breath. Negative for cough and chest tightness.   Cardiovascular:  Negative for chest pain and palpitations.  Gastrointestinal:   Negative for abdominal pain, constipation, diarrhea, nausea and vomiting.  Endocrine: Negative for cold intolerance.  Genitourinary:  Negative for dysuria, flank pain, frequency and penile pain.  Musculoskeletal:  Negative for arthralgias, back pain, joint swelling and neck pain.  Skin:  Negative for rash.  Neurological: Negative.  Negative for tremors and numbness.  Hematological:  Negative for adenopathy. Does not bruise/bleed easily.  Psychiatric/Behavioral: Negative.  Negative for behavioral problems (Depression), sleep disturbance and suicidal ideas. The patient is not nervous/anxious.      Vital Signs: BP 110/72   Pulse 78   Temp (!) 95.9 F (35.5 C)   Resp 16   Ht 5' 7 (1.702 m)   Wt 156 lb (70.8 kg)   SpO2 96%   BMI 24.43 kg/m    Physical Exam Constitutional:      Appearance: Normal appearance.  HENT:     Head: Normocephalic and atraumatic.     Right Ear: Tympanic membrane normal.     Left Ear: Tympanic membrane normal.     Nose: Nose normal.     Mouth/Throat:     Mouth: Mucous membranes are moist.     Pharynx: No posterior oropharyngeal erythema.  Eyes:     Extraocular Movements: Extraocular movements intact.     Pupils: Pupils are equal, round, and reactive to light.  Cardiovascular:     Rate and Rhythm: Normal rate and regular rhythm.     Pulses: Normal pulses.     Heart sounds: Normal heart sounds.  Pulmonary:     Effort: Pulmonary effort is normal.     Breath sounds: Normal breath sounds.  Abdominal:     General: Bowel sounds are normal.  Musculoskeletal:     Cervical back: Normal range of motion and neck supple.  Skin:    General: Skin is warm.  Neurological:     General: No focal deficit present.     Mental Status: He is alert. Mental status is at baseline.  Psychiatric:        Mood and Affect: Mood normal.        Behavior: Behavior normal.          Assessment/Plan: 1. Encounter for general adult medical examination with abnormal  findings (Primary) All PHM is updated   2. Rheumatoid arthritis involving multiple sites with positive rheumatoid factor (HCC) Followed by Rheumatology  - Iron, TIBC and Ferritin Panel - Ambulatory referral to Ophthalmology - gabapentin (NEURONTIN) 100 MG capsule; Take 100 mg twice a day for one week, then increase to 200 mg(2 tablets) twice a day and continue - inFLIXimab-axxq (AVSOLA) 100 MG injection; Inject into the vein. Get by [facility]  3. Anemia, unspecified type Will follow after iron studies are done  - Iron, TIBC and Ferritin Panel  4. Centrilobular emphysema (HCC) Will need PFT  - budesonide -formoterol  (SYMBICORT ) 160-4.5 MCG/ACT inhaler; Inhale 2 puffs into the lungs 2 (two) times daily.  Dispense: 1 each; Refill: - Iron, TIBC and Ferritin Panel - B12 and Folate Panel  5. Aortic atherosclerosis Continue  - Lipid Panel With LDL/HDL Ratio - Iron, TIBC and Ferritin Panel  3  6. High risk medication use - TSH - T4, free - Iron, TIBC and Ferritin Panel - B12 and Folate Panel - Ambulatory referral to Ophthalmology  7. Screening for prostate cancer - PSA Total (Reflex To Free)  8. Needs flu shot - Influenza, MDCK, trivalent, PF(Flucelvax egg-free)    General Counseling: Jamarie verbalizes understanding of the findings of todays visit and agrees with plan of treatment. I have discussed any further diagnostic evaluation that may be needed or ordered today. We also reviewed his medications today. he has been encouraged to call the office with any questions or concerns that should arise related to  todays visit.    Counseling:  [place] Controlled Substance Database was reviewed by me.  Orders Placed This Encounter  Procedures   Influenza, MDCK, trivalent, PF(Flucelvax egg-free)   Lipid Panel With LDL/HDL Ratio   TSH   T4, free   PSA Total (Reflex To Free)   Iron, TIBC and Ferritin Panel   B12 and Folate Panel   Ambulatory referral to Ophthalmology    Meds ordered this  encounter  Medications   budesonide -formoterol  (SYMBICORT ) 160-4.5 MCG/ACT inhaler    Sig: Inhale 2 puffs into the lungs 2 (two) times daily.    Dispense:  1 each    Refill:  3    Total time spent:45 Minutes  Time spent includes review of chart, medications, test results, and follow up plan with the patient.     Sigrid CHRISTELLA Bathe, MD  Internal Medicine

## 2024-07-17 LAB — LIPID PANEL WITH LDL/HDL RATIO
Cholesterol, Total: 116 mg/dL (ref 100–199)
HDL: 45 mg/dL (ref >39)
LDL Chol Calc (NIH): 53 mg/dL (ref 0–99)
LDL/HDL Ratio: 1.2 ratio (ref 0.0–3.6)
Triglycerides: 94 mg/dL (ref 0–149)
VLDL Cholesterol Cal: 18 mg/dL (ref 5–40)

## 2024-07-17 LAB — IRON,TIBC AND FERRITIN PANEL
Ferritin: 34 ng/mL (ref 30–400)
Iron Saturation: 30 % (ref 15–55)
Iron: 79 ug/dL (ref 38–169)
Total Iron Binding Capacity: 261 ug/dL (ref 250–450)
UIBC: 182 ug/dL (ref 111–343)

## 2024-07-17 LAB — T4, FREE: Free T4: 0.75 ng/dL — ABNORMAL LOW (ref 0.82–1.77)

## 2024-07-17 LAB — PSA TOTAL (REFLEX TO FREE): Prostate Specific Ag, Serum: 0.5 ng/mL (ref 0.0–4.0)

## 2024-07-17 LAB — B12 AND FOLATE PANEL
Folate: >20 ng/mL (ref >3.0)
Vitamin B-12: 756 pg/mL (ref 232–1245)

## 2024-07-17 LAB — TSH: TSH: 1.16 u[IU]/mL (ref 0.450–4.500)

## 2024-07-20 NOTE — Addendum Note (Signed)
 Addended by: FERNAND SIGRID HERO on: 07/20/2024 10:50 AM   Modules accepted: Orders

## 2024-07-21 ENCOUNTER — Telehealth: Payer: Self-pay | Admitting: Nurse Practitioner

## 2024-07-21 NOTE — Telephone Encounter (Signed)
 Ophthalmology referral sent via [facility] to [facility].  Gave patient telephone # [phone]

## 2024-07-29 ENCOUNTER — Ambulatory Visit (INDEPENDENT_AMBULATORY_CARE_PROVIDER_SITE_OTHER): Admitting: Internal Medicine

## 2024-07-29 DIAGNOSIS — J432 Centrilobular emphysema: Secondary | ICD-10-CM

## 2024-08-03 ENCOUNTER — Other Ambulatory Visit: Payer: Self-pay | Admitting: Nurse Practitioner

## 2024-08-03 DIAGNOSIS — G4709 Other insomnia: Secondary | ICD-10-CM

## 2024-08-05 ENCOUNTER — Telehealth: Payer: Self-pay | Admitting: Internal Medicine

## 2024-08-05 NOTE — Telephone Encounter (Signed)
 Per Amy w/ [facility], referral has been closed due to patient not returning calls -Andree

## 2024-08-18 ENCOUNTER — Ambulatory Visit (INDEPENDENT_AMBULATORY_CARE_PROVIDER_SITE_OTHER): Admitting: Internal Medicine

## 2024-08-18 ENCOUNTER — Encounter: Payer: Self-pay | Admitting: Internal Medicine

## 2024-08-18 VITALS — BP 150/84 | HR 76 | Temp 97.8°F | Resp 16 | Ht 67.0 in | Wt 158.0 lb

## 2024-08-18 DIAGNOSIS — J440 Chronic obstructive pulmonary disease with acute lower respiratory infection: Secondary | ICD-10-CM | POA: Diagnosis not present

## 2024-08-18 DIAGNOSIS — F331 Major depressive disorder, recurrent, moderate: Secondary | ICD-10-CM

## 2024-08-18 DIAGNOSIS — I10 Essential (primary) hypertension: Secondary | ICD-10-CM

## 2024-08-18 DIAGNOSIS — M0579 Rheumatoid arthritis with rheumatoid factor of multiple sites without organ or systems involvement: Secondary | ICD-10-CM

## 2024-08-18 DIAGNOSIS — J209 Acute bronchitis, unspecified: Secondary | ICD-10-CM | POA: Diagnosis not present

## 2024-08-18 DIAGNOSIS — E038 Other specified hypothyroidism: Secondary | ICD-10-CM

## 2024-08-18 MED ORDER — LEVOTHYROXINE SODIUM 50 MCG PO TABS: 50.0000 ug | ORAL_TABLET | Freq: Every day | ORAL | 3 refills | Status: AC

## 2024-08-18 MED ORDER — LOSARTAN POTASSIUM 25 MG PO TABS: 25.0000 mg | ORAL_TABLET | Freq: Every day | ORAL | 3 refills | Status: AC

## 2024-08-18 NOTE — Progress Notes (Signed)
 [facility] [address] [place], [place] [address]  Internal MEDICINE  Office Visit Note  Patient Name: Jose Cuevas  [id number]  [id number]  Date of Service: 08/18/2024  Chief Complaint  Patient presents with   Follow-up    PFT    HPI Patient seen today for routine follow-up PFTs reviewed today, he thinks Symbicort  has been helping Seen by cardiology and need to be on blood pressure medicine which was taken off in the hospital after carotid endarterectomy Continues to have severe pain requiring narcotics seen by pain management has severe rheumatoid arthritis Patient also suffers from major depressive disorder with insomnia Recent labs showed very low free T4    Current Medication: Outpatient Encounter Medications as of 08/18/2024  Medication Sig Note   acetaminophen  (TYLENOL ) 325 MG tablet Take 1-2 tablets (325-650 mg total) by mouth every 6 (six) hours as needed for mild pain (pain score 1-3) (or temp > 100.5).    albuterol  (VENTOLIN  HFA) 108 (90 Base) MCG/ACT inhaler Inhale 2 puffs into the lungs every 6 (six) hours as needed for wheezing or shortness of breath.    aspirin  EC 81 MG tablet Take 81 mg by mouth daily. Swallow whole.    bisacodyl  (DULCOLAX) 10 MG suppository Place 1 suppository (10 mg total) rectally daily as needed for moderate constipation.    budesonide -formoterol  (SYMBICORT ) 160-4.5 MCG/ACT inhaler Inhale 2 puffs into the lungs 2 (two) times daily.    clonazePAM  (KLONOPIN ) 1 MG tablet TAKE 1 TABLET BY MOUTH AT BEDTIME AS NEEDED FOR ANXIETY.    clopidogrel  (PLAVIX ) 75 MG tablet Take 1 tablet (75 mg total) by mouth daily.    DULoxetine  (CYMBALTA ) 30 MG capsule Take 1 capsule (30 mg total) by mouth at bedtime.    folic acid  (FOLVITE ) 1 MG tablet Take 1 tablet (1 mg total) by mouth daily.    furosemide  (LASIX ) 20 MG tablet Take one tab po M/W/F    gabapentin (NEURONTIN) 100 MG capsule Take 100 mg twice a day for one week, then increase to 200 mg(2  tablets) twice a day and continue    hydroxychloroquine  (PLAQUENIL ) 200 MG tablet Take one tablet TWICE daily weekdays. Take one tablet ONCE daily weekends. Take with food.    inFLIXimab-axxq (AVSOLA) 100 MG injection Inject into the vein. Get by [facility]    leflunomide  (ARAVA ) 20 MG tablet Take 1 tablet (20 mg total) by mouth daily.    levothyroxine (SYNTHROID) 50 MCG tablet Take 1 tablet (50 mcg total) by mouth daily. On empty stomach    lubiprostone  (AMITIZA ) 24 MCG capsule Take 24 mcg by mouth daily.    meclizine  (ANTIVERT ) 12.5 MG tablet Take 1 tablet (12.5 mg total) by mouth 3 (three) times daily as needed for dizziness.    metoCLOPramide  (REGLAN ) 10 MG tablet Take 1 tablet (10 mg total) by mouth 2 (two) times daily.    mirtazapine  (REMERON ) 15 MG tablet Take 1 tablet (15 mg total) by mouth at bedtime.    Nutritional Supplements (ENSURE ACTIVE HEART HEALTH) LIQD Take 1 each by mouth 3 (three) times daily as needed.    oxyCODONE  (OXY IR/ROXICODONE ) 5 MG immediate release tablet Take 1-2 tablets (5-10 mg total) by mouth every 4 (four) hours as needed for moderate pain (pain score 4-6) (pain score 4-6). (Patient taking differently: Take 530 mg by mouth every 4 (four) hours as needed for moderate pain (pain score 4-6) (pain score 4-6).)    pantoprazole  (PROTONIX ) 40 MG tablet Take 1  tablet (40 mg total) by mouth 2 (two) times daily.    Potassium Chloride  CR (MICRO-K ) 8 MEQ CPCR capsule CR TAKE ONE TAB PO M/W/F WITH LASIX     predniSONE  (DELTASONE ) 5 MG tablet Take by mouth.    rosuvastatin  (CRESTOR ) 5 MG tablet TAKE 1 TABLET (5 MG TOTAL) BY MOUTH DAILY.    tamsulosin  (FLOMAX ) 0.4 MG CAPS capsule Take 1 capsule (0.4 mg total) by mouth daily.    traZODone  (DESYREL ) 50 MG tablet Take 1 tablet (50 mg total) by mouth at bedtime as needed for sleep.    vitamin B-12 (CYANOCOBALAMIN ) 1000 MCG tablet Take 1,000 mcg by mouth daily. 03/31/2024: Wife states pt. Taking Multivitamin instead of Vit B12 and Vit D    [DISCONTINUED] levETIRAcetam  (KEPPRA ) 500 MG tablet Take 1 tablet (500 mg total) by mouth 2 (two) times daily.    [DISCONTINUED] metoprolol  tartrate (LOPRESSOR ) 25 MG tablet Take 1 tablet (25 mg total) by mouth daily as needed. Hold if SBP <110 mmHg and or HR <65    [DISCONTINUED] Vitamin D , Ergocalciferol , (DRISDOL ) 1.25 MG (50000 UNIT) CAPS capsule Take 1 capsule (50,000 Units total) by mouth every 7 (seven) days.    losartan  (COZAAR ) 25 MG tablet Take 1 tablet (25 mg total) by mouth daily. Hold if SBP <120    [DISCONTINUED] losartan  (COZAAR ) 25 MG tablet Take 1 tablet (25 mg total) by mouth daily. Hold if SBP <120 (Patient not taking: Reported on 08/18/2024)    No facility-administered encounter medications on file as of 08/18/2024.    Surgical History: Past Surgical History:  Procedure Laterality Date   BACK SURGERY     CAROTID PTA/STENT INTERVENTION Left 03/31/2024   Procedure: CAROTID PTA/STENT INTERVENTION;  Surgeon: Jama Cordella MATSU, MD;  Location: [facility] INVASIVE CV LAB;  Service: Cardiovascular;  Laterality: Left;   COLONOSCOPY WITH PROPOFOL  N/A 11/11/2015   Procedure: COLONOSCOPY WITH PROPOFOL ;  Surgeon: Donnice Vaughn Manes, MD;  Location: [facility] ENDOSCOPY;  Service: Endoscopy;  Laterality: N/A;   COLONOSCOPY WITH PROPOFOL  N/A 12/02/2015   Procedure: COLONOSCOPY WITH PROPOFOL ;  Surgeon: Donnice Vaughn Manes, MD;  Location: [facility] ENDOSCOPY;  Service: Endoscopy;  Laterality: N/A;   COLONOSCOPY WITH PROPOFOL  N/A 09/16/2019   Procedure: COLONOSCOPY WITH PROPOFOL ;  Surgeon: Toledo, Ladell POUR, MD;  Location: [facility] ENDOSCOPY;  Service: Gastroenterology;  Laterality: N/A;   ESOPHAGOGASTRODUODENOSCOPY (EGD) WITH PROPOFOL  N/A 11/11/2015   Procedure: ESOPHAGOGASTRODUODENOSCOPY (EGD) WITH PROPOFOL ;  Surgeon: Donnice Vaughn Manes, MD;  Location: [facility] ENDOSCOPY;  Service: Endoscopy;  Laterality: N/A;   ESOPHAGOGASTRODUODENOSCOPY (EGD) WITH PROPOFOL  N/A 12/02/2015   Procedure: ESOPHAGOGASTRODUODENOSCOPY (EGD)  WITH PROPOFOL ;  Surgeon: Donnice Vaughn Manes, MD;  Location: [facility] ENDOSCOPY;  Service: Endoscopy;  Laterality: N/A;   ESOPHAGOGASTRODUODENOSCOPY (EGD) WITH PROPOFOL  N/A 01/20/2016   Procedure: ESOPHAGOGASTRODUODENOSCOPY (EGD) WITH PROPOFOL ;  Surgeon: Donnice Vaughn Manes, MD;  Location: [facility] ENDOSCOPY;  Service: Endoscopy;  Laterality: N/A;   ESOPHAGOGASTRODUODENOSCOPY (EGD) WITH PROPOFOL  N/A 09/16/2019   Procedure: ESOPHAGOGASTRODUODENOSCOPY (EGD) WITH PROPOFOL ;  Surgeon: Toledo, Ladell POUR, MD;  Location: [facility] ENDOSCOPY;  Service: Gastroenterology;  Laterality: N/A;   KNEE RECONSTRUCTION Left    REVERSE SHOULDER ARTHROPLASTY Right 12/03/2023   Procedure: REVERSE SHOULDER ARTHROPLASTY;  Surgeon: Tobie Priest, MD;  Location: [facility] ORS;  Service: Orthopedics;  Laterality: Right;    Medical History: Past Medical History:  Diagnosis Date   Back pain    Collagen vascular disease    Rhematoid Arthritis hx.   Constipation, chronic    DDD (degenerative disc disease), cervical  History of duodenal ulcer    History of kidney stones    Hyperlipidemia    Hypertension    Low kidney function    LT Kidney non-functioning   Nondiabetic gastroparesis    Peripheral blood vessel disorder    Rheumatoid arthritis with rheumatoid factor (HCC)    Sleep apnea    Weight loss     Family History: Family History  Problem Relation Age of Onset   Diabetes Mother    Diabetes Father    Heart attack Maternal Grandmother    COPD Brother        08/2020   Tourette syndrome Son        in prison 2019   Learning disabilities Son     Social History   Socioeconomic History   Marital status: Married    Spouse name: Sari   Number of children: 4   Years of education: Not on file   Highest education level: Not on file  Occupational History   Not on file  Tobacco Use   Smoking status: Former    Types: Cigars   Smokeless tobacco: Never   Tobacco comments:    Occasional  3 a day. Stopped since January    Vaping Use   Vaping status: Never Used  Substance and Sexual Activity   Alcohol  use: No   Drug use: No   Sexual activity: Yes    Birth control/protection: None  Other Topics Concern   Not on file  Social History Narrative   Lives at home with wife. Kids lives within 2 miles    Social Drivers of Health   Financial Resource Strain: Low Risk  (04/15/2024)   Received from [facility]   Overall Financial Resource Strain (CARDIA)    Difficulty of Paying Living Expenses: Not very hard  Food Insecurity: No Food Insecurity (04/15/2024)   Received from [facility]   Hunger Vital Sign    Within the past 12 months, you worried that your food would run out before you got the money to buy more.: Never true    Within the past 12 months, the food you bought just didn't last and you didn't have money to get more.: Never true  Transportation Needs: No Transportation Needs (04/15/2024)   Received from [facility] - Transportation    In the past 12 months, has lack of transportation kept you from medical appointments or from getting medications?: No    Lack of Transportation (Non-Medical): No  Physical Activity: Not on file  Stress: Not on file  Social Connections: Socially Isolated (11/27/2023)   Social Connection and Isolation Panel    Frequency of Communication with Friends and Family: Once a week    Frequency of Social Gatherings with Friends and Family: Once a week    Attends Religious Services: Never    Database Administrator or Organizations: No    Attends Banker Meetings: Never    Marital Status: Married  Catering Manager Violence: Not At Risk (03/31/2024)   Humiliation, Afraid, Rape, and Kick questionnaire    Fear of Current or Ex-Partner: No    Emotionally Abused: No    Physically Abused: No    Sexually Abused: No      Review of Systems  Constitutional:  Negative for fatigue and fever.  HENT:  Negative  for congestion, mouth sores and postnasal drip.   Respiratory:  Negative for cough.   Cardiovascular:  Negative for  chest pain.  Genitourinary:  Negative for flank pain.  Psychiatric/Behavioral: Negative.      Vital Signs: BP (!) 150/84   Pulse 76   Temp 97.8 F (36.6 C)   Resp 16   Ht 5' 7 (1.702 m)   Wt 158 lb (71.7 kg)   SpO2 97%   BMI 24.75 kg/m    Physical Exam Constitutional:      Appearance: Normal appearance.  HENT:     Head: Normocephalic and atraumatic.     Nose: Nose normal.     Mouth/Throat:     Mouth: Mucous membranes are moist.     Pharynx: No posterior oropharyngeal erythema.  Eyes:     Extraocular Movements: Extraocular movements intact.     Pupils: Pupils are equal, round, and reactive to light.  Cardiovascular:     Pulses: Normal pulses.     Heart sounds: Normal heart sounds.  Pulmonary:     Effort: Pulmonary effort is normal.     Breath sounds: Normal breath sounds.  Musculoskeletal:        General: Swelling and tenderness present.  Skin:    General: Skin is warm.  Neurological:     General: No focal deficit present.     Mental Status: He is alert.  Psychiatric:        Mood and Affect: Mood normal.        Behavior: Behavior normal.        Assessment/Plan:  1. Acute bronchitis with COPD (HCC) (Primary) This is resolved patient is responding well to Symbicort , he is also on oxygen at night  2. Rheumatoid arthritis involving multiple sites with positive rheumatoid factor (HCC) Patient is on triple therapy, he will continue to see rheumatology and pain management, I did offer to treat his pain in future after I review his records - predniSONE  (DELTASONE ) 5 MG tablet; Take by mouth.  3. Moderate episode of recurrent major depressive disorder (HCC) Patient is on multiple medications including trazodone  Klonopin  and Remeron  for insomnia along with low-dose this can be optimized in future  4. Benign hypertension Restart his losartan  and  continue other medications as before - losartan  (COZAAR ) 25 MG tablet; Take 1 tablet (25 mg total) by mouth daily. Hold if SBP <120  Dispense: 90 tablet; Refill: 3  5. Other specified hypothyroidism He has free T4 on the lower side we will go ahead and start him on low-dose Synthroid as well - levothyroxine (SYNTHROID) 50 MCG tablet; Take 1 tablet (50 mcg total) by mouth daily. On empty stomach  Dispense: 90 tablet; Refill: 3  General Counseling: Matej verbalizes understanding of the findings of todays visit and agrees with plan of treatment. I have discussed any further diagnostic evaluation that may be needed or ordered today. We also reviewed his medications today. he has been encouraged to call the office with any questions or concerns that should arise related to todays visit.    No orders of the defined types were placed in this encounter.   Meds ordered this encounter  Medications   losartan  (COZAAR ) 25 MG tablet    Sig: Take 1 tablet (25 mg total) by mouth daily. Hold if SBP <120    Dispense:  90 tablet    Refill:  3   levothyroxine (SYNTHROID) 50 MCG tablet    Sig: Take 1 tablet (50 mcg total) by mouth daily. On empty stomach    Dispense:  90 tablet    Refill:  3    Total  time spent:40 Minutes Time spent includes review of chart, medications, test results, and follow up plan with the patient.   [place] Controlled Substance Database was reviewed by me.   Dr Jaykob Minichiello M Evalise Abruzzese Internal medicine

## 2024-08-18 NOTE — Procedures (Signed)
 [facility] [address] [place], [address]    Complete Pulmonary Function Testing Interpretation:  FINDINGS:  The forced vital capacity is normal.  FEV1 is normal.  FEV1 FVC ratio is mildly decreased.  Postbronchodilator no significant change in FEV1 is noted.  Total lung capacity is normal.  Residual volume is normal.  Residual volume total lung capacity ratio is increased.  FRC is increased.  The DLCO was mildly decreased.  IMPRESSION:  This pulmonary function study is within normal limits.  The DLCO is reduced and can be reduced in cases of cigarette smoking clinical correlation is recommended.  Elfreda DELENA Bathe, MD [facility] Pulmonary Critical Care Medicine Sleep Medicine

## 2024-08-19 LAB — PULMONARY FUNCTION TEST

## 2024-10-16 ENCOUNTER — Other Ambulatory Visit: Payer: Self-pay | Admitting: Nurse Practitioner

## 2024-10-16 DIAGNOSIS — F331 Major depressive disorder, recurrent, moderate: Secondary | ICD-10-CM

## 2024-10-26 ENCOUNTER — Encounter (INDEPENDENT_AMBULATORY_CARE_PROVIDER_SITE_OTHER)

## 2024-10-26 ENCOUNTER — Ambulatory Visit (INDEPENDENT_AMBULATORY_CARE_PROVIDER_SITE_OTHER): Admitting: Vascular Surgery

## 2024-11-06 ENCOUNTER — Other Ambulatory Visit: Payer: Self-pay | Admitting: Nurse Practitioner

## 2024-11-06 DIAGNOSIS — G4709 Other insomnia: Secondary | ICD-10-CM

## 2024-11-16 ENCOUNTER — Ambulatory Visit: Admitting: Nurse Practitioner

## 2025-07-19 ENCOUNTER — Ambulatory Visit: Admitting: Nurse Practitioner

## 2025-08-11 ENCOUNTER — Encounter: Admitting: Internal Medicine
